# Patient Record
Sex: Male | Born: 1980 | Hispanic: Yes | Marital: Single | Smoking: Never smoker
Health system: Southern US, Community
[De-identification: ages and names within clinical notes are randomized; demographics above are authoritative.]

## PROBLEM LIST (undated history)

## (undated) DIAGNOSIS — C169 Malignant neoplasm of stomach, unspecified: Secondary | ICD-10-CM

## (undated) DIAGNOSIS — K219 Gastro-esophageal reflux disease without esophagitis: Secondary | ICD-10-CM

## (undated) HISTORY — PX: NO PAST SURGERIES: SHX2092

---

## 2004-01-20 ENCOUNTER — Emergency Department: Payer: Self-pay | Admitting: Emergency Medicine

## 2004-01-20 ENCOUNTER — Other Ambulatory Visit: Payer: Self-pay

## 2004-01-22 ENCOUNTER — Ambulatory Visit: Payer: Self-pay | Admitting: Emergency Medicine

## 2004-02-18 ENCOUNTER — Emergency Department: Payer: Self-pay | Admitting: Emergency Medicine

## 2012-06-28 ENCOUNTER — Emergency Department: Payer: Self-pay | Admitting: Emergency Medicine

## 2012-06-28 LAB — URINALYSIS, COMPLETE
Bacteria: NONE SEEN
Bilirubin,UR: NEGATIVE
Glucose,UR: NEGATIVE mg/dL (ref 0–75)
Ketone: NEGATIVE
Nitrite: NEGATIVE
Protein: 100
RBC,UR: 2 /HPF (ref 0–5)
Specific Gravity: 1.02 (ref 1.003–1.030)
Squamous Epithelial: NONE SEEN
WBC UR: <1 /HPF (ref 0–5)

## 2012-06-28 LAB — COMPREHENSIVE METABOLIC PANEL
Albumin: 3.8 g/dL (ref 3.4–5.0)
BUN: 12 mg/dL (ref 7–18)
Bilirubin,Total: 0.6 mg/dL (ref 0.2–1.0)
Co2: 27 mmol/L (ref 21–32)
EGFR (African American): >60
EGFR (Non-African Amer.): >60
Glucose: 100 mg/dL — ABNORMAL HIGH (ref 65–99)
Potassium: 3.4 mmol/L — ABNORMAL LOW (ref 3.5–5.1)
SGOT(AST): 45 U/L — ABNORMAL HIGH (ref 15–37)
Sodium: 141 mmol/L (ref 136–145)

## 2012-06-28 LAB — CBC
MCH: 29.7 pg (ref 26.0–34.0)
MCV: 85 fL (ref 80–100)
Platelet: 206 10*3/uL (ref 150–440)
RBC: 5.89 10*6/uL (ref 4.40–5.90)
WBC: 10.1 10*3/uL (ref 3.8–10.6)

## 2012-06-28 LAB — ETHANOL
Ethanol %: <0.003 % (ref 0.000–0.080)
Ethanol: <3 mg/dL

## 2012-06-28 LAB — LIPASE, BLOOD: Lipase: 88 U/L (ref 73–393)

## 2013-10-25 ENCOUNTER — Emergency Department: Payer: Self-pay | Admitting: Emergency Medicine

## 2015-07-05 ENCOUNTER — Emergency Department
Admission: EM | Admit: 2015-07-05 | Discharge: 2015-07-05 | Disposition: A | Discharge status: Home / Self Care | Payer: Self-pay | Attending: Emergency Medicine | Admitting: Emergency Medicine

## 2015-07-05 ENCOUNTER — Encounter: Payer: Self-pay | Admitting: Emergency Medicine

## 2015-07-05 ENCOUNTER — Emergency Department: Payer: Self-pay

## 2015-07-05 DIAGNOSIS — K219 Gastro-esophageal reflux disease without esophagitis: Secondary | ICD-10-CM | POA: Insufficient documentation

## 2015-07-05 DIAGNOSIS — R1013 Epigastric pain: Secondary | ICD-10-CM

## 2015-07-05 LAB — URINALYSIS COMPLETE WITH MICROSCOPIC (ARMC ONLY)
BACTERIA UA: NONE SEEN
BILIRUBIN URINE: NEGATIVE
GLUCOSE, UA: NEGATIVE mg/dL
HGB URINE DIPSTICK: NEGATIVE
Ketones, ur: NEGATIVE mg/dL
LEUKOCYTES UA: NEGATIVE
NITRITE: NEGATIVE
Protein, ur: 100 mg/dL — AB
SQUAMOUS EPITHELIAL / LPF: NONE SEEN
Specific Gravity, Urine: 1.029 (ref 1.005–1.030)
pH: 5 (ref 5.0–8.0)

## 2015-07-05 LAB — CBC
HEMATOCRIT: 46.5 % (ref 40.0–52.0)
Hemoglobin: 15.6 g/dL (ref 13.0–18.0)
MCH: 28.4 pg (ref 26.0–34.0)
MCHC: 33.6 g/dL (ref 32.0–36.0)
MCV: 84.7 fL (ref 80.0–100.0)
PLATELETS: 218 10*3/uL (ref 150–440)
RBC: 5.5 MIL/uL (ref 4.40–5.90)
RDW: 12.9 % (ref 11.5–14.5)
WBC: 8.2 10*3/uL (ref 3.8–10.6)

## 2015-07-05 LAB — COMPREHENSIVE METABOLIC PANEL
ALBUMIN: 4.3 g/dL (ref 3.5–5.0)
ALT: 33 U/L (ref 17–63)
AST: 30 U/L (ref 15–41)
Alkaline Phosphatase: 68 U/L (ref 38–126)
Anion gap: 8 (ref 5–15)
BILIRUBIN TOTAL: 0.6 mg/dL (ref 0.3–1.2)
BUN: 14 mg/dL (ref 6–20)
CO2: 27 mmol/L (ref 22–32)
CREATININE: 0.91 mg/dL (ref 0.61–1.24)
Calcium: 8.9 mg/dL (ref 8.9–10.3)
Chloride: 105 mmol/L (ref 101–111)
GFR calc Af Amer: >60 mL/min (ref >60)
GLUCOSE: 182 mg/dL — AB (ref 65–99)
Potassium: 3.4 mmol/L — ABNORMAL LOW (ref 3.5–5.1)
Sodium: 140 mmol/L (ref 135–145)
TOTAL PROTEIN: 7.3 g/dL (ref 6.5–8.1)

## 2015-07-05 LAB — TROPONIN I

## 2015-07-05 LAB — LIPASE, BLOOD: Lipase: 22 U/L (ref 11–51)

## 2015-07-05 MED ORDER — OXYCODONE-ACETAMINOPHEN 5-325 MG PO TABS: 2.0000 | ORAL_TABLET | Freq: Four times a day (QID) | ORAL | Status: DC | PRN

## 2015-07-05 MED ORDER — GI COCKTAIL ~~LOC~~
30.0000 mL | Freq: Once | ORAL | Status: AC
  Administered 2015-07-05: 30 mL via ORAL
  Filled 2015-07-05: qty 30

## 2015-07-05 MED ORDER — FAMOTIDINE 20 MG PO TABS: 20.0000 mg | ORAL_TABLET | Freq: Two times a day (BID) | ORAL | Status: DC

## 2015-07-05 MED ORDER — FAMOTIDINE 20 MG PO TABS
20.0000 mg | ORAL_TABLET | Freq: Once | ORAL | Status: AC
Start: 2015-07-05 — End: 2015-07-05
  Administered 2015-07-05: 20 mg via ORAL
  Filled 2015-07-05: qty 1

## 2015-07-05 MED ORDER — ONDANSETRON HCL 4 MG/2ML IJ SOLN
4.0000 mg | Freq: Once | INTRAMUSCULAR | Status: AC
  Administered 2015-07-05: 4 mg via INTRAVENOUS
  Filled 2015-07-05: qty 2

## 2015-07-05 MED ORDER — SUCRALFATE 1 G PO TABS: 1.0000 g | ORAL_TABLET | Freq: Four times a day (QID) | ORAL | Status: DC

## 2015-07-05 MED ORDER — MORPHINE SULFATE (PF) 4 MG/ML IV SOLN
4.0000 mg | Freq: Once | INTRAVENOUS | Status: AC
  Administered 2015-07-05: 4 mg via INTRAVENOUS
  Filled 2015-07-05: qty 1

## 2015-07-05 NOTE — ED Notes (Signed)
Pt to ED with upper mid abdominal pain that radiates through to the back for 2 weeks now.  Pt endorses "some nausea and vomiting".  Some dizziness, diaphoresis and headache involved, off and on.

## 2015-07-05 NOTE — Discharge Instructions (Signed)
Enfermedad por reflujo gastroesofgico en los adultos (Gastroesophageal Reflux Disease, Adult) Normalmente, los alimentos descienden por el esfago y se depositan en el estmago para su digestin. Sin embargo, cuando una persona tiene enfermedad por reflujo gastroesofgico (ERGE), los alimentos y el cido estomacal regresan al esfago. Cuando esto ocurre, el esfago se irrita y se inflama. Con el tiempo, la ERGE puede provocar la formacin de pequeas perforaciones (lceras) en la mucosa del esfago.  CAUSAS Un problema del msculo que se encuentra entre el esfago y Product manager (esfnter esofgico inferior o EEI) es la causa de esta enfermedad. Por lo general, el esfnter esofgico inferior se cierra despus de que los alimentos pasan a travs del esfago hacia el [place]. Cuando el EEI est debilitado o no es normal, no se cierra correctamente, lo que permite el paso retrgrado de los alimentos y el cido estomacal al esfago. Algunas sustancias de la dieta, algunos medicamentos y Arboriculturist enfermedades pueden debilitar este esfnter, entre ellos:  Consumo de tabaco.  Antonito.  Hernia de hiato.  Consumo excesivo de alcohol.  Algunos alimentos y ciertas bebidas, como el caf, el chocolate, las cebollas y Production manager. FACTORES DE RIESGO Es ms probable que esta afeccin se manifieste en:  Los personas con sobrepeso.  Las personas con trastornos del tejido conjuntivo.  Las personas que toman antiinflamatorios no esteroides (AINE). SNTOMAS Los sntomas de esta afeccin incluyen lo siguiente:  Merchant navy officer.  Dificultad o dolor al tragar.  Sensacin de Best boy un bulto en la garganta.  Sabor amargo en la boca.  Mal aliento.  Gran cantidad de saliva.  Malestar estomacal o meteorismo.  Flatulencias.  Dolor en el pecho.  Falta de aire o sibilancias.  Tos permanente (crnica) o tos nocturna.  Desgaste el esmalte dental.  Prdida de peso. El dolor en el pecho puede deberse a  muchas afecciones diferentes. Consulte al mdico si tiene Tourist information centre manager. DIAGNSTICO El mdico le har una historia clnica y un examen fsico. Para determinar si la ERGE es leve o grave, el mdico tambin puede controlar la respuesta al [place]. Tambin pueden hacerle otros estudios, por ejemplo:  Una endoscopia para examinar el estmago y el esfago con Ardelia Mems pequea cmara.  Un estudio que determina el nivel de acidez en el esfago.  Un estudio que mide la presin que hay en el esfago.  Un estudio de deglucin de bario o un estudio modificado de deglucin de bario para mostrar la forma, el tamao y el funcionamiento del esfago. [place] es aliviar los sntomas y Product/process development scientist las complicaciones. El tratamiento de esta afeccin puede variar en funcin de la gravedad de los sntomas. El mdico podr indicar lo siguiente:  Cambios en la dieta.  Medicamentos.  Ciruga. INSTRUCCIONES PARA EL CUIDADO EN EL HOGAR Dieta  Siga la dieta que le haya recomendado el mdico, la cual puede incluir evitar alimentos y bebidas tales como:  Caf y t (con o sin cafena).  Bebidas que contengan alcohol.  Bebidas energizantes y deportivas.  Gaseosas o refrescos.  Chocolate y cacao.  Menta y [place].  Ajo y cebollas.  Rbano picante.  Alimentos muy condimentados y cidos, entre ellos, pimientos, [place] en polvo, curry en polvo, vinagre, salsas picantes y salsa barbacoa.  Frutas ctricas y sus jugos, como naranjas, limones y limas.  Alimentos a base de tomates, como salsa roja, [place], salsa y pizza con salsa roja.  Alimentos fritos y grasos, como rosquillas, papas fritas y aderezos con Counselling psychologist  contenido de [place].  Carnes con alto contenido de [place], como hot dogs y cortes grasos de carnes rojas y blancas, por ejemplo, filetes de entrecot, salchicha, jamn y tocino.  Productos lcteos con alto contenido de [place], como [place], [place] y  queso crema.  Haga comidas pequeas y frecuentes Medical sales representative de comidas abundantes.  Evite beber [place] comidas.  No coma durante las 2 o 3horas previas a la hora de [place].  No se acueste inmediatamente despus de comer.  No haga actividad fsica enseguida despus de comer. Instrucciones generales  Est atento a cualquier cambio en los sntomas.  Tome los medicamentos de venta libre y los recetados solamente como se lo haya indicado el mdico. No tome aspirina, ibuprofeno ni otros antiinflamatorios no esteroides (AINE), a menos que se lo haya indicado el mdico.  No consuma ningn producto que contenga tabaco, lo que incluye cigarrillos, tabaco de Higher education careers adviser y Psychologist, sport and exercise. Si necesita ayuda para dejar de fumar, consulte al mdico.  Use ropas sueltas. No use prendas ajustadas alrededor de la cintura que ejerzan presin en el abdomen.  Levante (eleve) 6pulgadas (15centmetros) la cabecera de la cama.  Trate de reducir [employer] de estrs con actividades como el yoga o la meditacin. Si necesita ayuda para reducir [employer] de estrs, consulte al mdico.  Si tiene sobrepeso, Multimedia programmer un peso saludable. Hable con el mdico acerca de su peso ideal y pdale asesoramiento en cuanto a la dieta que debe seguir para Therapist, music.  Concurra a todas las visitas de control como se lo haya indicado el mdico. Esto es importante. SOLICITE ATENCIN MDICA SI:  Aparecen nuevos sntomas.  Baja de peso sin causa aparente.  Tiene dificultad para tragar o siente dolor al [employer].  Tiene sibilancias o tos persistente.  Los sntomas no mejoran con Dispensing optician.  Tiene la voz ronca. [place] [employer] SI:  Tiene dolor en los brazos, el cuello, los [place], la dentadura o la espalda.  Philbert Riser, se marea o tiene sensacin de desvanecimiento.  Siente falta de aire o Tourist information centre manager.  Vomita y el vmito es  parecido a la sangre o a los granos de caf.  Se desmaya.  Las heces son sanguinolentas o de color negro.  No puede tragar, beber o comer.   Esta informacin no tiene Marine scientist el consejo del mdico. Asegrese de hacerle al mdico cualquier pregunta que tenga.   Document Released: 10/30/2004 Document Revised: 10/11/2014 Elsevier Interactive Patient Education [employer].

## 2015-07-05 NOTE — ED Provider Notes (Signed)
[facility] Emergency Department Provider Note        Time seen: ----------------------------------------- 12:02 PM on 07/05/2015 -----------------------------------------    I have reviewed the triage vital signs and the nursing notes.   HISTORY  Chief Complaint Abdominal Pain    HPI Anthony Melendez is a 35 y.o. male who presents ER for upper abdominal pain that radiates through his back for 2 weeks now. He reports some nausea and vomiting, dizziness, diaphoresis, headache. Currently the pain is in the upper abdomen. Nothing makes it better, eating any food makes it worse. Patient does report eating a lot of spicy foods.  History reviewed. No pertinent past medical history.  There are no active problems to display for this patient.   History reviewed. No pertinent past surgical history.  Allergies Review of patient's allergies indicates no known allergies.  Social History Social History  Substance Use Topics  . Smoking status: Never Smoker   . Smokeless tobacco: None  . Alcohol Use: No    Review of Systems Constitutional: Negative for fever. Eyes: Negative for visual changes. ENT: Negative for sore throat. Cardiovascular: Negative for chest pain. Respiratory: Negative for shortness of breath. Gastrointestinal: Positive for abdominal pain and vomiting Genitourinary: Negative for dysuria. Musculoskeletal: Negative for back pain. Skin: Negative for rash. Neurological: Negative for headaches, positive for weakness  10-point ROS otherwise negative.  ____________________________________________   PHYSICAL EXAM:  VITAL SIGNS: ED Triage Vitals  Enc Vitals Group     BP 07/05/15 1140 136/83 mmHg     Pulse Rate 07/05/15 1140 92     Resp 07/05/15 1140 18     Temp 07/05/15 1140 98.1 F (36.7 C)     Temp Source 07/05/15 1140 Oral     SpO2 07/05/15 1140 97 %     Weight 07/05/15 1140 180 lb (81.647 kg)     Height 07/05/15 1140 5\' 5"   (1.651 m)     Head Cir --      Peak Flow --      Pain Score 07/05/15 1141 8     Pain Loc --      Pain Edu? --      Excl. in [place]? --     Constitutional: Alert and oriented. Well appearing and in no distress. Eyes: Conjunctivae are normal. PERRL. Normal extraocular movements. ENT   Head: Normocephalic and atraumatic.   Nose: No congestion/rhinnorhea.   Mouth/Throat: Mucous membranes are moist.   Neck: No stridor. Cardiovascular: Normal rate, regular rhythm. No murmurs, rubs, or gallops. Respiratory: Normal respiratory effort without tachypnea nor retractions. Breath sounds are clear and equal bilaterally. No wheezes/rales/rhonchi. Gastrointestinal: Mild epigastric tenderness, no rebound or guarding. Normal bowel sounds. Musculoskeletal: Nontender with normal range of motion in all extremities. No lower extremity tenderness nor edema. Neurologic:  Normal speech and language. No gross focal neurologic deficits are appreciated.  Skin:  Skin is warm, dry and intact. No rash noted. Psychiatric: Mood and affect are normal. Speech and behavior are normal.  ____________________________________________  EKG: Interpreted by me. Sinus rhythm with a rate of 70 bpm, normal PR interval, normal QRS, normal QT interval. Normal axis.  ____________________________________________  ED COURSE:  Pertinent labs & imaging results that were available during my care of the patient were reviewed by me and considered in my medical decision making (see chart for details). Patient presents to ER with epigastric pain. I will check basic labs and reevaluate. ____________________________________________    LABS (pertinent positives/negatives)  Labs Reviewed  COMPREHENSIVE  METABOLIC PANEL - Abnormal; Notable for the following:    Potassium 3.4 (*)    Glucose, Bld 182 (*)    All other components within normal limits  URINALYSIS COMPLETEWITH MICROSCOPIC ([facility] ONLY) - Abnormal; Notable for the  following:    Color, Urine YELLOW (*)    APPearance CLEAR (*)    Protein, ur 100 (*)    All other components within normal limits  LIPASE, BLOOD  CBC  TROPONIN I    RADIOLOGY Images were viewed by me Chest x-ray, abdominal x-rays IMPRESSION: No acute disease.  IMPRESSION: No acute findings. ____________________________________________  FINAL ASSESSMENT AND PLAN  Epigastric pain, GERD  Plan: Patient with labs and imaging as dictated above. Patient presents with a grossly unremarkable workup. He was given a GI cocktail as well as started on Pepcid. He'll be discharged with Pepcid, Carafate and short supply of Percocet for pain. He is stable for discharge.   Earleen Newport, MD   Note: This dictation was prepared with Dragon dictation. Any transcriptional errors that result from this process are unintentional   Earleen Newport, MD 07/05/15 1353

## 2015-07-28 ENCOUNTER — Other Ambulatory Visit: Payer: Self-pay

## 2015-07-28 ENCOUNTER — Emergency Department: Payer: Self-pay

## 2015-07-28 ENCOUNTER — Encounter: Payer: Self-pay | Admitting: Emergency Medicine

## 2015-07-28 DIAGNOSIS — R079 Chest pain, unspecified: Secondary | ICD-10-CM | POA: Insufficient documentation

## 2015-07-28 DIAGNOSIS — C169 Malignant neoplasm of stomach, unspecified: Secondary | ICD-10-CM | POA: Insufficient documentation

## 2015-07-28 DIAGNOSIS — K219 Gastro-esophageal reflux disease without esophagitis: Secondary | ICD-10-CM | POA: Insufficient documentation

## 2015-07-28 DIAGNOSIS — Z79899 Other long term (current) drug therapy: Secondary | ICD-10-CM | POA: Insufficient documentation

## 2015-07-28 LAB — CBC
HEMATOCRIT: 41.6 % (ref 40.0–52.0)
HEMOGLOBIN: 14.3 g/dL (ref 13.0–18.0)
MCH: 28.3 pg (ref 26.0–34.0)
MCHC: 34.4 g/dL (ref 32.0–36.0)
MCV: 82.3 fL (ref 80.0–100.0)
Platelets: 269 10*3/uL (ref 150–440)
RBC: 5.05 MIL/uL (ref 4.40–5.90)
RDW: 12.4 % (ref 11.5–14.5)
WBC: 7.5 10*3/uL (ref 3.8–10.6)

## 2015-07-28 LAB — COMPREHENSIVE METABOLIC PANEL
ALT: 22 U/L (ref 17–63)
ANION GAP: 7 (ref 5–15)
AST: 24 U/L (ref 15–41)
Albumin: 4.1 g/dL (ref 3.5–5.0)
Alkaline Phosphatase: 73 U/L (ref 38–126)
BILIRUBIN TOTAL: 0.4 mg/dL (ref 0.3–1.2)
BUN: 13 mg/dL (ref 6–20)
CHLORIDE: 104 mmol/L (ref 101–111)
CO2: 28 mmol/L (ref 22–32)
Calcium: 8.9 mg/dL (ref 8.9–10.3)
Creatinine, Ser: 0.85 mg/dL (ref 0.61–1.24)
Glucose, Bld: 126 mg/dL — ABNORMAL HIGH (ref 65–99)
POTASSIUM: 3.5 mmol/L (ref 3.5–5.1)
Sodium: 139 mmol/L (ref 135–145)
TOTAL PROTEIN: 7.3 g/dL (ref 6.5–8.1)

## 2015-07-28 LAB — TROPONIN I: Troponin I: <0.03 ng/mL (ref <0.031)

## 2015-07-28 LAB — LIPASE, BLOOD: LIPASE: 28 U/L (ref 11–51)

## 2015-07-28 MED ORDER — ONDANSETRON 4 MG PO TBDP
4.0000 mg | ORAL_TABLET | Freq: Once | ORAL | Status: AC | PRN
  Administered 2015-07-28: 4 mg via ORAL
  Filled 2015-07-28: qty 1

## 2015-07-28 NOTE — ED Notes (Addendum)
Pt c/o pain to upper abd intermittent pain for about 6 weeks; seen here 3 weeks ago for same; has been to [facility] ER twice since that visit here for same; pt diagnosed with gastric reflux; pt says after eating anything pain increases; c/o nausea and vomiting, last vomited on the way to the ED; pt says pain sometimes moves from left to right; also has intermittent chest pain from left to right; currently c/o upper left sided chest pain and mild headache; pt says he was taking the prescribed medications up to today, but didn't take because he doesn't see it helping

## 2015-07-28 NOTE — ED Notes (Signed)
Interpreter request submitted. 

## 2015-07-29 ENCOUNTER — Emergency Department
Admission: EM | Admit: 2015-07-29 | Discharge: 2015-07-29 | Disposition: A | Discharge status: Home / Self Care | Payer: Self-pay | Attending: Emergency Medicine | Admitting: Emergency Medicine

## 2015-07-29 ENCOUNTER — Emergency Department: Payer: Self-pay

## 2015-07-29 DIAGNOSIS — R109 Unspecified abdominal pain: Secondary | ICD-10-CM

## 2015-07-29 DIAGNOSIS — C169 Malignant neoplasm of stomach, unspecified: Secondary | ICD-10-CM

## 2015-07-29 HISTORY — DX: Gastro-esophageal reflux disease without esophagitis: K21.9

## 2015-07-29 LAB — URINALYSIS COMPLETE WITH MICROSCOPIC (ARMC ONLY)
BILIRUBIN URINE: NEGATIVE
Glucose, UA: NEGATIVE mg/dL
HGB URINE DIPSTICK: NEGATIVE
Leukocytes, UA: NEGATIVE
NITRITE: NEGATIVE
PH: 5 (ref 5.0–8.0)
PROTEIN: 100 mg/dL — AB
SQUAMOUS EPITHELIAL / LPF: NONE SEEN
Specific Gravity, Urine: 1.033 — ABNORMAL HIGH (ref 1.005–1.030)

## 2015-07-29 MED ORDER — MORPHINE SULFATE (PF) 4 MG/ML IV SOLN
4.0000 mg | Freq: Once | INTRAVENOUS | Status: DC
  Filled 2015-07-29: qty 1

## 2015-07-29 MED ORDER — OXYCODONE-ACETAMINOPHEN 5-325 MG PO TABS: 1.0000 | ORAL_TABLET | ORAL | Status: DC | PRN

## 2015-07-29 MED ORDER — DOCUSATE SODIUM 100 MG PO CAPS: ORAL_CAPSULE | ORAL | Status: DC

## 2015-07-29 MED ORDER — IOPAMIDOL (ISOVUE-300) INJECTION 61%
100.0000 mL | Freq: Once | INTRAVENOUS | Status: AC | PRN
  Administered 2015-07-29: 100 mL via INTRAVENOUS

## 2015-07-29 MED ORDER — DIATRIZOATE MEGLUMINE & SODIUM 66-10 % PO SOLN
15.0000 mL | Freq: Once | ORAL | Status: AC
  Administered 2015-07-29: 15 mL via ORAL

## 2015-07-29 NOTE — ED Notes (Signed)
Assessed pt with assistance of translator [place]. Pt c/o upper abdominal pain, chest pain and upper medial back pain described as pressure. Pt c/o N/V, SOB. Pt denies diarrhea

## 2015-07-29 NOTE — ED Notes (Signed)
Interpreter at bedside. Pt denies pain at this time. Requesting pain medication for discharge.

## 2015-07-29 NOTE — Discharge Instructions (Signed)
Cncer gstrico (Gastric Cancer) Al cncer gstrico tambin se lo conoce como cncer de estmago. Es un crecimiento anormal de clulas en el estmago que es canceroso [place]).  CAUSAS La causa exacta del cncer gstrico se desconoce. FACTORES DE RIESGO  Ser mayor de [phone].  Ser varn.  Ser estadounidenses de [place] oriental, de las islas del Pacf[place], hispano o afroamericano.  Consumir una dieta con alto contenido de alimentos ahumados, [employer] o encurtidos.  Consumir cualquier producto que contenga tabaco, como cigarrillos, tabaco de Higher education careers adviser o Psychologist, sport and exercise.  Consumir bebidas alcohlicas en exceso.  Tener antecedentes de lo siguiente:  Ciruga de Paramedic.  Gastritis crnica.  Plipos estomacales.  Anemia perniciosa.  Infeccin estomacal por bacterias H. pylori.  Antecedentes familiares de cncer gstrico.  Sangre tipoA. [place] los sntomas se pueden incluir los siguientes:  Prdida del apetito.  Sensacin de saciedad despus de ingerir una comida pequea.  Problemas para tragar.  Nuseas.  Dolor abdominal.  Gases en exceso o flatulencias.  Bajar de peso sin proponrselo.  Vmitos. Pueden incluir vmitos con sangre.  Fatiga. DIAGNSTICO El mdico le har preguntas sobre su historia clnica y un examen fsico. Podrn solicitarle otros estudios, por ejemplo:  Deglucin de bario.  Examen endoscpico.  Tomografa computarizada.  Una prueba de [place] de tejido (biopsia). Si se confirma el diagnstico de cncer gstrico, se lo estadificar para determinar su gravedad y extensin. La estadificacin es la evaluacin de lo siguiente:  El tamao del tumor.  Si el cncer se ha diseminado.  Adnde se ha diseminado. TRATAMIENTO El tratamiento del cncer gstrico depende del tipo y el estadio de la enfermedad, y puede incluir uno o ms de los siguientes:  Ciruga para extirpar todo lo que sea posible del tumor  (gastrectoma).  Quimioterapia. Este tratamiento utiliza medicamentos para destruir las clulas cancerosas.  Radioterapia para destruir las clulas cancerosas.  Tratamiento farmacolgico dirigido. Estos medicamentos inhiben el crecimiento y la diseminacin del cncer. Este tratamiento es diferente de la quimioterapia estndar.  Inmunoterapia. Tambin conocida como terapia biolgica. Se trata de un tratamiento novedoso que fortalece el sistema de defensa del organismo (inmunitario) para combatir las clulas cancerosas.  Se pueden administrar antibiticos para tratar la infeccin porH. pylori. INSTRUCCIONES PARA EL CUIDADO EN EL HOGAR  Hable con el mdico sobre la dieta y el plan de alimentacin ms adecuados para usted, que pueden incluir el consumo de ms frutas y verduras. Adems, evitar lo siguiente:  Carne roja.  Carnes procesadas.  Alimentos salados.  Alimentos DIRECTV.  Alimentos encurtidos.  Tome los medicamentos solamente como se lo haya indicado el mdico.  Si le recetaron antibiticos, asegrese de terminarlos, incluso si comienza a sentirse mejor.  Hable con el mdico sobre cmo limitar o evitar el consumo de alcohol.  No consuma ningn producto que contenga tabaco, lo que incluye cigarrillos, tabaco de Higher education careers adviser o Psychologist, sport and exercise. Si necesita ayuda para dejar de fumar, consulte al mdico.  Concurra a todas las visitas de control como se lo haya indicado el mdico.  Considere la posibilidad de Chief Financial Officer en un grupo de apoyo.  Busque asesoramiento que lo ayude a [employer] secundarios del [place]. SOLICITE ATENCIN MDICA SI:  Tiene dificultad para comer.  Tiene problemas con los medicamentos.  Sigue bajando de peso sin proponrselo.  Tiene fiebre. SOLICITE ATENCIN MDICA DE INMEDIATO SI:  Tiene nuseas, vmitos o diarrea que no puede controlar.  Siente un dolor que no IT consultant.  Vomita sangre de color rojo brillante o  material que parezca granos de caf.  Tiene dificultad para respirar.  Se desmaya.   Esta informacin no tiene Marine scientist el consejo del mdico. Asegrese de hacerle al mdico cualquier pregunta que tenga.   Document Released: 05/08/2008 Document Revised: 02/10/2014 Elsevier Interactive Patient Education [employer].

## 2015-07-29 NOTE — ED Provider Notes (Signed)
[facility] Emergency Department Provider Note  ____________________________________________  Time seen: Approximately 1:31 AM  I have reviewed the triage vital signs and the nursing notes.   HISTORY  Chief Complaint Abdominal Pain; Nausea; and Chest Pain  The patient and/or family speak(s) Spanish.  They understand they have the right to the use of a hospital interpreter, however at this time they prefer to speak directly with me in [place].  They know that they can ask for an interpreter at any time.   HPI Anthony Melendez is a 35 y.o. male with no significant past medical history but who his been to this ED once previously and the [facility] emergency Department twice previously, all over about the last 6 weeks, for evaluation of abdominal pain in the epigastric region that radiates through to his chest and back.  He describes it as severe, sharp, stabbing, and burning.  He usually occurs more severely after he eats.  It is been consistent and persistent over the last 6 weeks.  He denies nausea, vomiting, shortness of breath, diarrhea, constipation, fever/chills.  He claims that he has been taking the medication as prescribed previously (antacids and sucralfate) but that it is not been helping.  Food seems to make it worse and nothing seems to make it better.   Past Medical History  Diagnosis Date  . GERD (gastroesophageal reflux disease)     There are no active problems to display for this patient.   History reviewed. No pertinent past surgical history.  Current Outpatient Rx  Name  Route  Sig  Dispense  Refill  . famotidine (PEPCID) 20 MG tablet   Oral   Take 1 tablet (20 mg total) by mouth 2 (two) times daily.   60 tablet   1   . sucralfate (CARAFATE) 1 g tablet   Oral   Take 1 tablet (1 g total) by mouth 4 (four) times daily.   120 tablet   1   . docusate sodium (COLACE) 100 MG capsule      Take 1 tablet once or twice daily as needed for  constipation while taking narcotic pain medicine   30 capsule   0   . oxyCODONE-acetaminophen (ROXICET) 5-325 MG tablet   Oral   Take 1-2 tablets by mouth every 4 (four) hours as needed for severe pain.   30 tablet   0     Allergies Review of patient's allergies indicates no known allergies.  History reviewed. No pertinent family history.  Social History Social History  Substance Use Topics  . Smoking status: Never Smoker   . Smokeless tobacco: None  . Alcohol Use: No    Review of Systems Constitutional: No fever/chills Eyes: No visual changes. ENT: No sore throat. Cardiovascular: chest pain from abdomen Respiratory: Denies shortness of breath. Gastrointestinal: +abdominal pain radiating into chest and back.  No nausea, no vomiting.  No diarrhea.  No constipation. Genitourinary: Negative for dysuria. Musculoskeletal: Negative for back pain. Skin: Negative for rash. Neurological: Negative for headaches, focal weakness or numbness.  10-point ROS otherwise negative.  ____________________________________________   PHYSICAL EXAM:  VITAL SIGNS: ED Triage Vitals  Enc Vitals Group     BP 07/29/15 0047 108/76 mmHg     Pulse Rate 07/29/15 0047 63     Resp 07/29/15 0047 26     Temp --      Temp Source 07/28/15 2155 Oral     SpO2 07/29/15 0047 96 %     Weight 07/28/15  2155 195 lb (88.451 kg)     Height 07/28/15 2155 5\' 5"  (1.651 m)     Head Cir --      Peak Flow --      Pain Score 07/28/15 2156 5     Pain Loc --      Pain Edu? --      Excl. in [place]? --     Constitutional: Alert and oriented. Well appearing and in no acute distress. Eyes: Conjunctivae are normal. PERRL. EOMI. Head: Atraumatic. Nose: No congestion/rhinnorhea. Mouth/Throat: Mucous membranes are moist.  Oropharynx non-erythematous. Neck: No stridor.  No meningeal signs.   Cardiovascular: Normal rate, regular rhythm. Good peripheral circulation. Grossly normal heart sounds.   Respiratory: Normal  respiratory effort.  No retractions. Lungs CTAB. Gastrointestinal: Soft With mild tenderness to palpation of the epigastrium Musculoskeletal: No lower extremity tenderness nor edema. No gross deformities of extremities. Neurologic:  Normal speech and language. No gross focal neurologic deficits are appreciated.  Skin:  Skin is warm, dry and intact. No rash noted. Psychiatric: Mood and affect are normal. Speech and behavior are normal.  ____________________________________________   LABS (all labs ordered are listed, but only abnormal results are displayed)  Labs Reviewed  COMPREHENSIVE METABOLIC PANEL - Abnormal; Notable for the following:    Glucose, Bld 126 (*)    All other components within normal limits  URINALYSIS COMPLETEWITH MICROSCOPIC ([facility] ONLY) - Abnormal; Notable for the following:    Color, Urine AMBER (*)    APPearance CLEAR (*)    Ketones, ur TRACE (*)    Specific Gravity, Urine 1.033 (*)    Protein, ur 100 (*)    Bacteria, UA RARE (*)    All other components within normal limits  LIPASE, BLOOD  CBC  TROPONIN I   ____________________________________________  EKG  ED ECG REPORT I, Daffney Greenly, the attending physician, personally viewed and interpreted this ECG.  Date: 07/29/2015 EKG Time: 22:09 Rate: 76 Rhythm: normal sinus rhythm QRS Axis: normal Intervals: normal ST/T Wave abnormalities: normal Conduction Disturbances: none Narrative Interpretation: unremarkable  ____________________________________________  RADIOLOGY   Dg Chest 2 View  07/28/2015  CLINICAL DATA:  Left-sided chest pain. EXAM: CHEST  2 VIEW COMPARISON:  July 05, 2015 FINDINGS: The heart, hila, mediastinum, and pleura are normal. No pneumothorax. Possible 6 mm nodule in the left upper lung, overlapping two ribs. No other nodules. No masses or infiltrates. No other acute abnormalities. IMPRESSION: Suggested 6 mm nodule in the left upper lung, not seen previously. It is possible this  could represent confluence of shadows as it was not seen on the x-ray from 24 days ago. A CT scan could better evaluate this region. Otherwise, recommend short-term follow-up x-ray. Electronically Signed   By: Dorise Bullion III M.D   On: 07/28/2015 22:42   Ct Chest W Contrast  07/29/2015  CLINICAL DATA:  35 year old male with upper abdominal/lower chest pain EXAM: CT CHEST, ABDOMEN, AND PELVIS WITH CONTRAST TECHNIQUE: Multidetector CT imaging of the chest, abdomen and pelvis was performed following the standard protocol during bolus administration of intravenous contrast. CONTRAST:  183mL ISOVUE-300 IOPAMIDOL (ISOVUE-300) INJECTION 61% COMPARISON:  Chest radiograph dated 07/28/2015 FINDINGS: CT CHEST The lungs are clear. No pulmonary nodule identified. There is no pleural effusion or pneumothorax. The central airways are patent. The thoracic aorta appears unremarkable. The origins of the great vessels of the aortic arch appear patent. The central pulmonary arteries appear unremarkable. There is no cardiomegaly or pericardial effusion. No hilar or mediastinal  adenopathy. The esophagus is grossly unremarkable. No thyroid nodule identified. There is no axillary adenopathy. The chest wall soft tissues appear unremarkable. The osseous structures are intact. CT ABDOMEN AND PELVIS There is no intra-abdominal free air.  Small ascites. The liver, gallbladder, pancreas, spleen, adrenal glands, kidneys, visualized ureters, and urinary bladder appear unremarkable. The prostate and seminal vesicles are grossly unremarkable. There is diffuse thickened and irregular appearance of the gastric body and pylorus highly concerning for underlying infiltrative neoplasm such as adenocarcinoma ( possibly serous type) or less likely lymphoma. The wall of the stomach measures approximately 4 cm in thickness at the lesser curvature There is extensive nodular and infiltrative involvement of the upper mesentery and gastrohepatic ligament.  There is diffuse omental nodularity. There is thickened and irregular appearance of the diaphragms, right greater left compatible with metastatic implants. Oral contrast is noted within the stomach and multiple loops of small bowel traversing into the colon without evidence of bowel obstruction. Normal appendix. The abdominal aorta and IVC appear unremarkable. The origins of the celiac axis, SMA, IMA as well as the origins of the renal arteries are patent. No portal venous gas identified. No retroperitoneal adenopathy. Small fat containing umbilical hernia. The abdominal wall soft tissues are otherwise unremarkable. The osseous structures are intact. IMPRESSION: Thickened and irregular in gastric wall most compatible with an infiltrative malignancy. Endoscopy is recommended for further evaluation. There is extensive involvement of the upper mesentery and omentum compatible with peritoneal carcinomatosis. Small ascites, likely malignant. No bowel obstruction.  Normal appendix. No acute intrathoracic pathology. These results were called by telephone at the time of interpretation on 07/29/2015 at 6:55 am to Dr. Hinda Kehr , who verbally acknowledged these results. Electronically Signed   By: Anner Crete M.D.   On: 07/29/2015 07:00   Ct Abdomen Pelvis W Contrast  07/29/2015  CLINICAL DATA:  35 year old male with upper abdominal/lower chest pain EXAM: CT CHEST, ABDOMEN, AND PELVIS WITH CONTRAST TECHNIQUE: Multidetector CT imaging of the chest, abdomen and pelvis was performed following the standard protocol during bolus administration of intravenous contrast. CONTRAST:  118mL ISOVUE-300 IOPAMIDOL (ISOVUE-300) INJECTION 61% COMPARISON:  Chest radiograph dated 07/28/2015 FINDINGS: CT CHEST The lungs are clear. No pulmonary nodule identified. There is no pleural effusion or pneumothorax. The central airways are patent. The thoracic aorta appears unremarkable. The origins of the great vessels of the aortic arch  appear patent. The central pulmonary arteries appear unremarkable. There is no cardiomegaly or pericardial effusion. No hilar or mediastinal adenopathy. The esophagus is grossly unremarkable. No thyroid nodule identified. There is no axillary adenopathy. The chest wall soft tissues appear unremarkable. The osseous structures are intact. CT ABDOMEN AND PELVIS There is no intra-abdominal free air.  Small ascites. The liver, gallbladder, pancreas, spleen, adrenal glands, kidneys, visualized ureters, and urinary bladder appear unremarkable. The prostate and seminal vesicles are grossly unremarkable. There is diffuse thickened and irregular appearance of the gastric body and pylorus highly concerning for underlying infiltrative neoplasm such as adenocarcinoma ( possibly serous type) or less likely lymphoma. The wall of the stomach measures approximately 4 cm in thickness at the lesser curvature There is extensive nodular and infiltrative involvement of the upper mesentery and gastrohepatic ligament. There is diffuse omental nodularity. There is thickened and irregular appearance of the diaphragms, right greater left compatible with metastatic implants. Oral contrast is noted within the stomach and multiple loops of small bowel traversing into the colon without evidence of bowel obstruction. Normal appendix. The abdominal aorta and  IVC appear unremarkable. The origins of the celiac axis, SMA, IMA as well as the origins of the renal arteries are patent. No portal venous gas identified. No retroperitoneal adenopathy. Small fat containing umbilical hernia. The abdominal wall soft tissues are otherwise unremarkable. The osseous structures are intact. IMPRESSION: Thickened and irregular in gastric wall most compatible with an infiltrative malignancy. Endoscopy is recommended for further evaluation. There is extensive involvement of the upper mesentery and omentum compatible with peritoneal carcinomatosis. Small ascites, likely  malignant. No bowel obstruction.  Normal appendix. No acute intrathoracic pathology. These results were called by telephone at the time of interpretation on 07/29/2015 at 6:55 am to Dr. Hinda Kehr , who verbally acknowledged these results. Electronically Signed   By: Anner Crete M.D.   On: 07/29/2015 07:00   US Abdomen Limited Ruq  07/29/2015  CLINICAL DATA:  Intermittent RIGHT upper quadrant pain for 6 weeks. EXAM: US ABDOMEN LIMITED - RIGHT UPPER QUADRANT COMPARISON:  None. FINDINGS: Gallbladder: No gallstones or wall thickening visualized. No sonographic Murphy sign noted by sonographer. Common bile duct: Diameter: 3 mm Liver: Mildly echogenic liver. Small amount of perihepatic free fluid. No intrahepatic free fluid. 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 Small amount of ascites. Electronically Signed   By: Elon Alas M.D.   On: 07/29/2015 04:50    ____________________________________________   PROCEDURES  Procedure(s) performed:   Procedures   ____________________________________________   INITIAL IMPRESSION / ASSESSMENT AND PLAN / ED COURSE  Pertinent labs & imaging results that were available during my care of the patient were reviewed by me and considered in my medical decision making (see chart for details).  I reviewed his prior notes in [facility] as well as the [facility] notes through the care everywhere.  He received a bedside ultrasound at [facility] by one of the attendings which showed no gallbladder disease, but given how consistent his symptoms seem with gallbladder disease, I will proceed with a right upper quadrant ultrasound.  If this is unremarkable, I will proceed with the CT scan of his abdomen and pelvis given the persistence of his symptoms and the fact that he has not had any CT scans yet but continues to suffer.  Additionally, he had a nonspecific nodule on his chest x-ray which likely is simply artifact but should be evaluated  further given no specific diagnosis yet for his other symptoms that include abdominal pain radiating through into his chest.   (Note that documentation was delayed due to multiple ED patients requiring immediate care.)   The ultrasound was generally unremarkable and nonacute.  I proceeded with a CT scan with IV contrast of his chest and abdomen/pelvis.  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  There is no diagnosis or finding that necessitates or justifies admission at this time.  I had 2 separate phone conversations with Dr. Mike Gip the oncologist who concurs with my assessment but promises to arrange close outpatient follow-up in the cancer center.  I had a lengthy conversation with the patient and his family members via the hospital Spanish interpreter about the results.  They understand and agree with the plan for close follow-up.  I sent a message to Dr. Mike Gip through [facility] with contact names and phone numbers for English-speaking members of the family who the patient and empowered to speak on his behalf in terms of scheduling follow-up appointments.  The patient remains hemodynamically stable.   ____________________________________________  FINAL CLINICAL IMPRESSION(S) / ED DIAGNOSES  Final diagnoses:  Malignant neoplasm of stomach, unspecified location (HCC)  Abdominal pain, unspecified abdominal location     MEDICATIONS GIVEN DURING THIS VISIT:  Medications  morphine 4 MG/ML injection 4 mg (4 mg Intravenous Not Given 07/29/15 0841)  ondansetron (ZOFRAN-ODT) disintegrating tablet 4 mg (4 mg Oral Given 07/28/15 2219)  diatrizoate meglumine-sodium (GASTROGRAFIN) 66-10 % solution 15 mL (15 mLs Oral Given 07/29/15 0532)  iopamidol (ISOVUE-300) 61 % injection 100 mL (100 mLs Intravenous Contrast Given 07/29/15 0610)     NEW OUTPATIENT MEDICATIONS STARTED DURING THIS VISIT:  New Prescriptions   DOCUSATE SODIUM  (COLACE) 100 MG CAPSULE    Take 1 tablet once or twice daily as needed for constipation while taking narcotic pain medicine   OXYCODONE-ACETAMINOPHEN (ROXICET) 5-325 MG TABLET    Take 1-2 tablets by mouth every 4 (four) hours as needed for severe pain.      Note:  This document was prepared using Dragon voice recognition software and may include unintentional dictation errors.   Hinda Kehr, MD 07/29/15 [phone]

## 2015-07-29 NOTE — ED Notes (Signed)
MD Forbach at bedside. 

## 2015-07-29 NOTE — ED Notes (Signed)
Pt reports his abdominal and back pain increases when eating

## 2015-07-29 NOTE — ED Notes (Signed)
Pt taken to CT.

## 2015-07-29 NOTE — ED Notes (Signed)
Pt taken to [place], accompanied by rafael, interpreter

## 2015-07-31 ENCOUNTER — Inpatient Hospital Stay: Payer: Self-pay | Admitting: Hematology and Oncology

## 2015-07-31 ENCOUNTER — Other Ambulatory Visit: Payer: Self-pay

## 2015-07-31 NOTE — Progress Notes (Signed)
  Oncology Nurse Navigator Documentation  Navigator Location: [facility]-Med Onc (07/31/15 1500) Navigator Encounter Type: Initial MedOnc (07/31/15 1500)   Abnormal Finding Date: 07/30/15 (07/31/15 1500)       Patient Visit Type: Initial (07/31/15 1500) Treatment Phase: Abnormal Scans (07/31/15 1500) Barriers/Navigation Needs:  (Language) (07/31/15 1500)                Acuity: Level 2 (07/31/15 1500)   Acuity Level 2: Initial guidance, education and coordination as needed;Educational needs;Ongoing guidance and education throughout treatment as needed (07/31/15 1500)     Time Spent with Patient: 30 (07/31/15 1500)   Met with Anthony Melendez and his family in MD waiting area this am along with the interpreter. He is havig his appt changed to tomorrow in [place] with Dr Mike Gip. Introduced Therapist, nutritional and provided him with my contact information for future needs following his consult.

## 2015-08-01 ENCOUNTER — Encounter: Payer: Self-pay | Admitting: *Deleted

## 2015-08-01 ENCOUNTER — Inpatient Hospital Stay: Payer: 59 | Attending: Hematology and Oncology | Admitting: Hematology and Oncology

## 2015-08-01 ENCOUNTER — Inpatient Hospital Stay: Payer: Self-pay

## 2015-08-01 ENCOUNTER — Encounter: Payer: Self-pay | Admitting: Hematology and Oncology

## 2015-08-01 DIAGNOSIS — R1013 Epigastric pain: Secondary | ICD-10-CM

## 2015-08-01 DIAGNOSIS — R51 Headache: Secondary | ICD-10-CM | POA: Insufficient documentation

## 2015-08-01 DIAGNOSIS — R5383 Other fatigue: Secondary | ICD-10-CM | POA: Insufficient documentation

## 2015-08-01 DIAGNOSIS — R634 Abnormal weight loss: Secondary | ICD-10-CM

## 2015-08-01 DIAGNOSIS — C801 Malignant (primary) neoplasm, unspecified: Secondary | ICD-10-CM

## 2015-08-01 DIAGNOSIS — K3189 Other diseases of stomach and duodenum: Secondary | ICD-10-CM | POA: Insufficient documentation

## 2015-08-01 DIAGNOSIS — C786 Secondary malignant neoplasm of retroperitoneum and peritoneum: Secondary | ICD-10-CM | POA: Insufficient documentation

## 2015-08-01 DIAGNOSIS — R112 Nausea with vomiting, unspecified: Secondary | ICD-10-CM | POA: Diagnosis not present

## 2015-08-01 DIAGNOSIS — K219 Gastro-esophageal reflux disease without esophagitis: Secondary | ICD-10-CM

## 2015-08-01 DIAGNOSIS — R109 Unspecified abdominal pain: Secondary | ICD-10-CM | POA: Diagnosis not present

## 2015-08-01 DIAGNOSIS — R1900 Intra-abdominal and pelvic swelling, mass and lump, unspecified site: Secondary | ICD-10-CM | POA: Diagnosis not present

## 2015-08-01 DIAGNOSIS — Z79899 Other long term (current) drug therapy: Secondary | ICD-10-CM | POA: Insufficient documentation

## 2015-08-01 LAB — CBC WITH DIFFERENTIAL/PLATELET
Basophils Absolute: 0 10*3/uL (ref 0–0.1)
Basophils Relative: 1 %
Eosinophils Absolute: 0.1 10*3/uL (ref 0–0.7)
Eosinophils Relative: 1 %
HCT: 42.9 % (ref 40.0–52.0)
Hemoglobin: 14.6 g/dL (ref 13.0–18.0)
Lymphocytes Relative: 15 %
Lymphs Abs: 1.2 10*3/uL (ref 1.0–3.6)
MCH: 28 pg (ref 26.0–34.0)
MCHC: 34.1 g/dL (ref 32.0–36.0)
MCV: 82.2 fL (ref 80.0–100.0)
Monocytes Absolute: 0.5 10*3/uL (ref 0.2–1.0)
Monocytes Relative: 6 %
Neutro Abs: 6.2 10*3/uL (ref 1.4–6.5)
Neutrophils Relative %: 77 %
Platelets: 266 10*3/uL (ref 150–440)
RBC: 5.22 MIL/uL (ref 4.40–5.90)
RDW: 12.3 % (ref 11.5–14.5)
WBC: 8 10*3/uL (ref 3.8–10.6)

## 2015-08-01 LAB — CREATININE, SERUM
Creatinine, Ser: 0.88 mg/dL (ref 0.61–1.24)
GFR calc Af Amer: >60 mL/min (ref >60)
GFR calc non Af Amer: >60 mL/min (ref >60)

## 2015-08-01 LAB — APTT: aPTT: 33 seconds (ref 24–36)

## 2015-08-01 LAB — LACTATE DEHYDROGENASE: LDH: 114 U/L (ref 98–192)

## 2015-08-01 LAB — URIC ACID: Uric Acid, Serum: 6.8 mg/dL (ref 4.4–7.6)

## 2015-08-01 LAB — PROTIME-INR
INR: 1.1
Prothrombin Time: 14.2 seconds (ref 11.4–15.0)

## 2015-08-01 NOTE — Progress Notes (Signed)
Pt reports mid abdominal pain, left sided, and lower bi-lateral pain.  Last Saturday had Nausea and vomiting when went to the ER.  Pt reports that he has had this pain for about a month with nausea and vomiting appearing about a week later after onset of pain.  Weight loss of a total of 30 lbs this month.  Has headaches come and go.  Appetite a 100% if he didn't have pain.  Pt reports spicy, fatty, can effect him more, but all food creates pain.  Yesterday reports eating chicken soup and still don't feel good after.  There are sometimes it hurts and sometimes it does not.  He reports he does not have medication for nausea but is interested in having.

## 2015-08-02 ENCOUNTER — Ambulatory Visit
Admission: RE | Admit: 2015-08-02 | Discharge: 2015-08-02 | Disposition: A | Discharge status: Home / Self Care | Payer: 59 | Source: Ambulatory Visit | Attending: Gastroenterology | Admitting: Gastroenterology

## 2015-08-02 ENCOUNTER — Ambulatory Visit: Payer: 59 | Admitting: Anesthesiology

## 2015-08-02 ENCOUNTER — Encounter: Payer: Self-pay | Admitting: *Deleted

## 2015-08-02 ENCOUNTER — Encounter
Admission: RE | Disposition: A | Discharge status: Home / Self Care | Payer: Self-pay | Source: Ambulatory Visit | Attending: Gastroenterology

## 2015-08-02 DIAGNOSIS — D490 Neoplasm of unspecified behavior of digestive system: Secondary | ICD-10-CM | POA: Diagnosis not present

## 2015-08-02 DIAGNOSIS — B9681 Helicobacter pylori [H. pylori] as the cause of diseases classified elsewhere: Secondary | ICD-10-CM | POA: Insufficient documentation

## 2015-08-02 DIAGNOSIS — K2970 Gastritis, unspecified, without bleeding: Secondary | ICD-10-CM

## 2015-08-02 DIAGNOSIS — Z79899 Other long term (current) drug therapy: Secondary | ICD-10-CM | POA: Insufficient documentation

## 2015-08-02 DIAGNOSIS — R933 Abnormal findings on diagnostic imaging of other parts of digestive tract: Secondary | ICD-10-CM

## 2015-08-02 DIAGNOSIS — C786 Secondary malignant neoplasm of retroperitoneum and peritoneum: Secondary | ICD-10-CM | POA: Insufficient documentation

## 2015-08-02 DIAGNOSIS — C169 Malignant neoplasm of stomach, unspecified: Secondary | ICD-10-CM | POA: Insufficient documentation

## 2015-08-02 DIAGNOSIS — R198 Other specified symptoms and signs involving the digestive system and abdomen: Secondary | ICD-10-CM | POA: Insufficient documentation

## 2015-08-02 DIAGNOSIS — K2961 Other gastritis with bleeding: Secondary | ICD-10-CM | POA: Insufficient documentation

## 2015-08-02 DIAGNOSIS — C168 Malignant neoplasm of overlapping sites of stomach: Secondary | ICD-10-CM | POA: Insufficient documentation

## 2015-08-02 HISTORY — PX: ESOPHAGOGASTRODUODENOSCOPY (EGD) WITH PROPOFOL: SHX5813

## 2015-08-02 LAB — CEA: CEA: 0.3 ng/mL (ref 0.0–4.7)

## 2015-08-02 SURGERY — ESOPHAGOGASTRODUODENOSCOPY (EGD) WITH PROPOFOL
Anesthesia: Monitor Anesthesia Care | Wound class: Clean Contaminated

## 2015-08-02 MED ORDER — ACETAMINOPHEN 325 MG PO TABS: 325.0000 mg | ORAL_TABLET | ORAL | Status: DC | PRN

## 2015-08-02 MED ORDER — LIDOCAINE HCL (CARDIAC) 20 MG/ML IV SOLN
INTRAVENOUS | Status: DC | PRN
  Administered 2015-08-02: 50 mg via INTRAVENOUS

## 2015-08-02 MED ORDER — GLYCOPYRROLATE 0.2 MG/ML IJ SOLN
INTRAMUSCULAR | Status: DC | PRN
  Administered 2015-08-02: 0.1 mg via INTRAVENOUS

## 2015-08-02 MED ORDER — PROPOFOL 10 MG/ML IV BOLUS
INTRAVENOUS | Status: DC | PRN
  Administered 2015-08-02: 150 mg via INTRAVENOUS
  Administered 2015-08-02: 20 mg via INTRAVENOUS
  Administered 2015-08-02: 30 mg via INTRAVENOUS

## 2015-08-02 MED ORDER — LACTATED RINGERS IV SOLN
INTRAVENOUS | Status: DC
  Administered 2015-08-02: 11:00:00 via INTRAVENOUS

## 2015-08-02 MED ORDER — STERILE WATER FOR IRRIGATION IR SOLN
Status: DC | PRN
  Administered 2015-08-02: 11:00:00

## 2015-08-02 MED ORDER — ACETAMINOPHEN 160 MG/5ML PO SOLN: 325.0000 mg | ORAL | Status: DC | PRN

## 2015-08-02 SURGICAL SUPPLY — 32 items

## 2015-08-02 NOTE — Discharge Instructions (Signed)
General Anesthesia, Adult, Care After Refer to this sheet in the next few weeks. These instructions provide you with information on caring for yourself after your procedure. Your health care provider may also give you more specific instructions. Your treatment has been planned according to current medical practices, but problems sometimes occur. Call your health care provider if you have any problems or questions after your procedure. WHAT TO EXPECT AFTER THE PROCEDURE After the procedure, it is typical to experience:  Sleepiness.  Nausea and vomiting. HOME CARE INSTRUCTIONS  For the first 24 hours after general anesthesia:  Have a responsible person with you.  Do not drive a car. If you are alone, do not take public transportation.  Do not drink alcohol.  Do not take medicine that has not been prescribed by your health care provider.  Do not sign important papers or make important decisions.  You may resume a normal diet and activities as directed by your health care provider.  Change bandages (dressings) as directed.  If you have questions or problems that seem related to general anesthesia, call the hospital and ask for the anesthetist or anesthesiologist on call. SEEK MEDICAL CARE IF:  You have nausea and vomiting that continue the day after anesthesia.  You develop a rash. SEEK IMMEDIATE MEDICAL CARE IF:   You have difficulty breathing.  You have chest pain.  You have any allergic problems.   This information is not intended to replace advice given to you by your health care provider. Make sure you discuss any questions you have with your health care provider.   Document Released: 04/28/2000 Document Revised: 02/10/2014 Document Reviewed: 05/21/2011 Elsevier Interactive Patient Education 2016 [place] general, adultos, cuidados posteriores (General Anesthesia, Adult, Care After) Siga estas instrucciones durante las prximas semanas. Estas  indicaciones le proporcionan informacin acerca de cmo deber cuidarse despus del procedimiento. El mdico tambin podr darle instrucciones ms especficas. El tratamiento se ha planificado de acuerdo a las prcticas mdicas actuales, pero a veces se producen problemas. Comunquese con el mdico si tiene algn problema o tiene dudas despus del procedimiento. QU ESPERAR DESPUS DEL PROCEDIMIENTO Despus del procedimiento es habitual experimentar:  Somnolencia.  Nuseas y vmitos. INSTRUCCIONES PARA EL CUIDADO EN EL HOGAR  Durante las primeras 24 horas luego de la anestesia general:  Haga que una persona responsable se quede con usted.  No conduzca un automvil. Si est solo, no viaje en transporte pblico.  No beba alcohol.  No tome medicamentos que no le haya recetado su mdico.  No firme documentos importantes ni tome decisiones trascendentes.  Puede reanudar su dieta y sus actividades normales segn le haya indicado el mdico.  Cambie los vendajes (apsitos) tal como se le indic.  Si tiene preguntas o se le presenta algn problema relacionado con la anestesia general, comunquese con el hospital y pida por el anestesista o anestesilogo de [place]. SOLICITE ATENCIN MDICA SI:  Tiene nuseas y Christine posterior a la anestesia.  Le aparece una erupcin cutnea. SOLICITE ATENCIN MDICA DE INMEDIATO SI:   Tiene dificultad para respirar.  Siente dolor en el pecho.  Tiene algn problema alrgico.   Esta informacin no tiene como fin reemplazar el consejo del mdico. Asegrese de hacerle al mdico cualquier pregunta que tenga.   Document Released: 01/20/2005 Document Revised: 02/10/2014 Elsevier Interactive Patient Education [employer].

## 2015-08-02 NOTE — Progress Notes (Signed)
[facility] day:  08/01/2015  Chief Complaint: Anthony Melendez is a 35 y.o. male with gastric wall thickening and peritoneal carcinomatosis who is referred in consultation by Dr. Hinda Kehr for assessment and management.  HPI:  The patient denies any past medical history.  Over the past 4-6 weeks,  he has noted increasing abdominal pain.  Pain is predominantly in the epigastric region, but has recently extended across his upper abdomen and down into his lower abdomen. Pain comes and goes and is worse with eating.  He has had some nausea and vomiting.  He denies any constipation, diarrhea, melena or hematochezia.  Patient has been seen 4 times in the emergency room (twice at [facility] and twice at [facility]).  He has been prescribed antacids and Carafate without improvement in symptoms.  He describes a 30 pound weight loss over the past month secondary to decreased oral intake.  He was seen in the [facility] ER on 07/29/2015.  Because of persistent symptoms, imaging studies were performed.  Chest, abdomen and pelvic CT scan on 07/29/2015 revealed a 4 cm thickened and irregular gastric wall compatible with an infiltrative malignancy. There was extensive involvement of the upper mesentery and omentum compatible with peritoneal carcinomatosis. There was a small amount of ascites. There was no evidence of obstruction or intrathoracic pathology.  CBC on 07/28/2015 revealed a hematocrit of 41.6, hemoglobin 14.3, platelets 269,000, white count 7500.  Comprehensive metabolic panel was normal.  He denies any family history of malignancy.  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    Symptomatically, the patient notes ongoing intermittent abdominal pain.  He typically uses 1-2 oxycodone a day.  He notes new headaches.  He denies any numbness, weakness, balance or coordination issues.   Past Medical History  Diagnosis Date  . GERD (gastroesophageal reflux disease)     Past  Surgical History  Procedure Laterality Date  . No past surgeries      History reviewed. No pertinent family history.  Social History:  reports that he has never smoked. He does not have any smokeless tobacco history on file. He reports that he does not drink alcohol or use illicit drugs.  The patient is from [place].  He speaks [place].  He moves to the [place] in 2000.  He lives in [place] with his father, mother, brother and sister.  He is a Theme park manager.  The patient is accompanied by his brother and father today.  Interpreter services via teleconferencing were obtained.  Allergies: No Known Allergies  Current Medications: Current Outpatient Prescriptions  Medication Sig Dispense Refill  . oxyCODONE-acetaminophen (ROXICET) 5-325 MG tablet Take 1-2 tablets by mouth every 4 (four) hours as needed for severe pain. 30 tablet 0  . docusate sodium (COLACE) 100 MG capsule Take 1 tablet once or twice daily as needed for constipation while taking narcotic pain medicine (Patient not taking: Reported on 08/01/2015) 30 capsule 0  . famotidine (PEPCID) 20 MG tablet Take 1 tablet (20 mg total) by mouth 2 (two) times daily. (Patient not taking: Reported on 08/01/2015) 60 tablet 1  . sucralfate (CARAFATE) 1 g tablet Take 1 tablet (1 g total) by mouth 4 (four) times daily. (Patient not taking: Reported on 08/01/2015) 120 tablet 1   No current facility-administered medications for this visit.    Review of Systems:  GENERAL:  Fatigue.  No fevers or sweats.  Weight loss of 30 pounds in the past month. PERFORMANCE STATUS (ECOG):  1 HEENT:  No visual changes, runny nose, sore throat, mouth sores or tenderness. Lungs: No shortness of breath or cough.  No hemoptysis. Cardiac:  No chest pain, palpitations, orthopnea, or PND. GI:   Abdominal pain (see HPI).  Nausea and vomiting.  No diarrhea, constipation, melena or hematochezia. GU:  No urgency, frequency, dysuria, or hematuria. Musculoskeletal:  Chest and   back pain.  No joint pain.  No muscle tenderness. Extremities:  No pain or swelling. Skin:  No rashes or skin changes. Neuro:  Headache.  No numbness or weakness, balance or coordination issues. Endocrine:  No diabetes, thyroid issues, hot flashes or night sweats. Psych:  No mood changes, depression or anxiety. Pain:  No focal pain. Review of systems:  All other systems reviewed and found to be negative.  Physical Exam: Blood pressure 106/72, pulse 69, temperature 97.8 F (36.6 C), temperature source Tympanic, resp. rate 18, height _0  (1.651 m), weight 184 lb 4.9 oz (83.6 kg). GENERAL:  Well developed, well nourished, gentleman sitting comfortably in the exam room in no acute distress. MENTAL STATUS:  Alert and oriented to person, place and time. HEAD:  Wearing a baseball cap.  Black hair.  Mustache.  Normocephalic, atraumatic, face symmetric, no Cushingoid features. EYES:  Brown eyes.  Pupils equal round and reactive to light and accomodation.  No conjunctivitis or scleral icterus. ENT:  Oropharynx clear without lesion.  Tongue normal. Mucous membranes moist.  RESPIRATORY:  Clear to auscultation without rales, wheezes or rhonchi. CARDIOVASCULAR:  Regular rate and rhythm without murmur, rub or gallop. ABDOMEN:  Soft, tender in the epigastric region, but also across upper abdomen and left side of abdomen.  No guarding or rebound tenderness.  Active bowel sounds, and no hepatosplenomegaly.  No masses. SKIN:  No rashes, ulcers or lesions. EXTREMITIES: No edema, no skin discoloration or tenderness.  No palpable cords. LYMPH NODES: No palpable cervical, supraclavicular, axillary or inguinal adenopathy  NEUROLOGICAL: Alert & oriented, cranial nerves II-XII intact; motor strength 5/5 throughout; sensation intact; finger to nose and RAM normal; able to walk heel to toe; negative Rhomberg; no clonus or Babinski. PSYCH:  Appropriate.    Office Visit on 08/01/2015  Component Date Value Ref Range  Status  . Creatinine, Ser 08/01/2015 0.88  0.61 - 1.24 mg/dL Final  . GFR calc non Af Amer 08/01/2015 >60  >60 mL/min Final  . GFR calc Af Amer 08/01/2015 >60  >60 mL/min Final   Comment: (NOTE) The eGFR has been calculated using the CKD EPI equation. This calculation has not been validated in all clinical situations. eGFR's persistently <60 mL/min signify possible Chronic Kidney Disease.   . WBC 08/01/2015 8.0  3.8 - 10.6 K/uL Final   A-LINE DRAW  . RBC 08/01/2015 5.22  4.40 - 5.90 MIL/uL Final  . Hemoglobin 08/01/2015 14.6  13.0 - 18.0 g/dL Final  . HCT 08/01/2015 42.9  40.0 - 52.0 % Final  . MCV 08/01/2015 82.2  80.0 - 100.0 fL Final  . MCH 08/01/2015 28.0  26.0 - 34.0 pg Final  . MCHC 08/01/2015 34.1  32.0 - 36.0 g/dL Final  . RDW 08/01/2015 12.3  11.5 - 14.5 % Final  . Platelets 08/01/2015 266  150 - 440 K/uL Final  . Neutrophils Relative % 08/01/2015 77%   Final  . Neutro Abs 08/01/2015 6.2  1.4 - 6.5 K/uL Final  . Lymphocytes Relative 08/01/2015 15%   Final  . Lymphs Abs 08/01/2015 1.2  1.0 - 3.6 K/uL Final  .  Monocytes Relative 08/01/2015 6%   Final  . Monocytes Absolute 08/01/2015 0.5  0.2 - 1.0 K/uL Final  . Eosinophils Relative 08/01/2015 1%   Final  . Eosinophils Absolute 08/01/2015 0.1  0 - 0.7 K/uL Final  . Basophils Relative 08/01/2015 1%   Final  . Basophils Absolute 08/01/2015 0.0  0 - 0.1 K/uL Final  . LDH 08/01/2015 114  98 - 192 U/L Final  . Uric Acid, Serum 08/01/2015 6.8  4.4 - 7.6 mg/dL Final  . Prothrombin Time 08/01/2015 14.2  11.4 - 15.0 seconds Final   A-LINE DRAW  . INR 08/01/2015 1.10   Final  . aPTT 08/01/2015 33  24 - 36 seconds Final    Assessment:  Shamir Tuzzolino is a 35 y.o. 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  He presented with a 4-6 week history of progressive epigastric pain.   Chest, abdomen and pelvic CT scan on 07/29/2015 revealed a 4 cm thickened and irregular gastric wall compatible with an infiltrative malignancy. There  was extensive involvement of the upper mesentery and omentum compatible with peritoneal carcinomatosis. There was a small amount of ascites.   Symptomatically, he has abdominal pain aggravated by eating.  He has lost 30 pounds in the past month.  He has new headaches.  Neurologic exam is normal.  Plan: 1.  Review imaging studies with patient.  Discuss concern for malignancy (gastric cancer or lymphoma).  Discuss plan for EGD and biopsy.  Discuss plan for chemotherapy based on pathology.  Discuss plan for labs and head CT. 2.  Discuss pain management.  Discuss taking pain medication 30-45 minutes prior to eating.  Discuss importance of caloric and fluid intake. 3.  Labs today:  CBC with diff, LDH, uric acid, CEA, Cr, PT, PTT. 4.  EGD with Dr Allen Norris on 08/02/2015. 5.  RTC on 08/08/2015 for MD assessment, review of labs, pathology, and imaging studies, and discussion regarding direction of therapy.   Lequita Asal, MD  08/01/2015

## 2015-08-02 NOTE — Transfer of Care (Signed)
Immediate Anesthesia Transfer of Care Note  Patient: Anthony Melendez  Procedure(s) Performed: Procedure(s): ESOPHAGOGASTRODUODENOSCOPY (EGD) WITH PROPOFOL (N/A)  Patient Location: PACU  Anesthesia Type: MAC  Level of Consciousness: awake, alert  and patient cooperative  Airway and Oxygen Therapy: Patient Spontanous Breathing and Patient connected to supplemental oxygen  Post-op Assessment: Post-op Vital signs reviewed, Patient's Cardiovascular Status Stable, Respiratory Function Stable, Patent Airway and No signs of Nausea or vomiting  Post-op Vital Signs: Reviewed and stable  Complications: No apparent anesthesia complications

## 2015-08-02 NOTE — Op Note (Signed)
[facility] Gastroenterology Patient Name: Anthony Melendez Procedure Date: 08/02/2015 11:23 AM MRN: [id number] Account #: [id number] Date of Birth: 16-Aug-1980 Admit Type: Outpatient Age: 35 Room: [facility] OR ROOM 01 Gender: Male Note Status: Finalized Procedure:            Upper GI endoscopy Indications:          Abnormal CT of the GI tract Providers:            Lucilla Lame, MD Medicines:            Propofol per Anesthesia Complications:        No immediate complications. Procedure:            Pre-Anesthesia Assessment:                       - Prior to the procedure, a History and Physical was                        performed, and patient medications and allergies were                        reviewed. The patient's tolerance of previous                        anesthesia was also reviewed. The risks and benefits of                        the procedure and the sedation options and risks were                        discussed with the patient. All questions were                        answered, and informed consent was obtained. Prior                        Anticoagulants: The patient has taken no previous                        anticoagulant or antiplatelet agents. ASA Grade                        Assessment: II - A patient with mild systemic disease.                        After reviewing the risks and benefits, the patient was                        deemed in satisfactory condition to undergo the                        procedure.                       After obtaining informed consent, the endoscope was                        passed under direct vision. Throughout the procedure,                        the patient's  blood pressure, pulse, and oxygen                        saturations were monitored continuously. The Olympus                        GIF [id number] colonscope [id number] was introduced                        through the mouth, and advanced to the second part of                         duodenum. The upper GI endoscopy was accomplished                        without difficulty. The patient tolerated the procedure                        well. Findings:      The examined esophagus was normal.      A large, ulcerated, non-circumferential mass with oozing bleeding and       stigmata of recent bleeding was found in the entire examined stomach.       Biopsies were taken with a cold forceps for histology.      The examined duodenum was normal.      Mass in involving the fundus down to the antrum. Impression:           - Normal esophagus.                       - Rule out malignancy, gastric tumor stomach. Biopsied.                       - Normal examined duodenum.                       - Mass in involving the fundus down to the antrum. Recommendation:       - Await pathology results. Procedure Code(s):    --- Professional ---                       [phone], Esophagogastroduodenoscopy, flexible, transoral;                        with biopsy, single or multiple Diagnosis Code(s):    --- Professional ---                       R93.3, Abnormal findings on diagnostic imaging of other                        parts of digestive tract                       D49.0, Neoplasm of unspecified behavior of digestive                        system CPT copyright 2016 American Medical Association. All rights reserved. The codes documented in this report are preliminary and upon coder review may  be revised to meet current compliance requirements. Lucilla Lame, MD 08/02/2015 11:33:04 AM This report has been signed electronically. Number of Addenda:  0 Note Initiated On: 08/02/2015 11:23 AM Total Procedure Duration: 0 hours 2 minutes 58 seconds       [facility]

## 2015-08-02 NOTE — Anesthesia Procedure Notes (Signed)
Procedure Name: MAC Date/Time: 08/02/2015 11:22 AM Performed by: Cameron Ali Pre-anesthesia Checklist: Patient identified, Emergency Drugs available, Suction available, Timeout performed and Patient being monitored Patient Re-evaluated:Patient Re-evaluated prior to inductionOxygen Delivery Method: Nasal cannula Placement Confirmation: positive ETCO2

## 2015-08-02 NOTE — Anesthesia Preprocedure Evaluation (Signed)
Anesthesia Evaluation  Patient identified by MRN, date of birth, ID band  Reviewed: Allergy & Precautions, H&P , NPO status , Patient's Chart, lab work & pertinent test results  Airway Mallampati: II  TM Distance: >3 FB Neck ROM: full    Dental no notable dental hx.    Pulmonary    Pulmonary exam normal        Cardiovascular  Rhythm:regular Rate:Normal     Neuro/Psych    GI/Hepatic GERD  ,  Endo/Other    Renal/GU      Musculoskeletal   Abdominal   Peds  Hematology   Anesthesia Other Findings   Reproductive/Obstetrics                             Anesthesia Physical Anesthesia Plan  ASA: II  Anesthesia Plan: MAC   Post-op Pain Management:    Induction:   Airway Management Planned:   Additional Equipment:   Intra-op Plan:   Post-operative Plan:   Informed Consent: I have reviewed the patients History and Physical, chart, labs and discussed the procedure including the risks, benefits and alternatives for the proposed anesthesia with the patient or authorized representative who has indicated his/her understanding and acceptance.     Plan Discussed with: CRNA  Anesthesia Plan Comments:         Anesthesia Quick Evaluation  

## 2015-08-02 NOTE — Anesthesia Postprocedure Evaluation (Signed)
Anesthesia Post Note  Patient: Sherrel Wallman  Procedure(s) Performed: Procedure(s) (LRB): ESOPHAGOGASTRODUODENOSCOPY (EGD) WITH PROPOFOL (N/A)  Patient location during evaluation: PACU Anesthesia Type: MAC Level of consciousness: awake and alert and oriented Pain management: satisfactory to patient Vital Signs Assessment: post-procedure vital signs reviewed and stable Respiratory status: spontaneous breathing, nonlabored ventilation and respiratory function stable Cardiovascular status: blood pressure returned to baseline and stable Postop Assessment: Adequate PO intake and No signs of nausea or vomiting Anesthetic complications: no    Raliegh Ip

## 2015-08-02 NOTE — H&P (Signed)
  Lucilla Lame, MD [address] [place], [place] [address] Phone: [phone] Fax : [phone]  Primary Care Physician:  No PCP Per Patient Primary Gastroenterologist:  Dr. Allen Norris  Pre-Procedure History & Physical: HPI:  Anthony Melendez is a 35 y.o. male is here for an endoscopy.   Past Medical History  Diagnosis Date  . GERD (gastroesophageal reflux disease)     Past Surgical History  Procedure Laterality Date  . No past surgeries      Prior to Admission medications   Medication Sig Start Date End Date Taking? Authorizing Provider  docusate sodium (COLACE) 100 MG capsule Take 1 tablet once or twice daily as needed for constipation while taking narcotic pain medicine 07/29/15  Yes Hinda Kehr, MD  oxyCODONE-acetaminophen (ROXICET) 5-325 MG tablet Take 1-2 tablets by mouth every 4 (four) hours as needed for severe pain. 07/29/15  Yes Hinda Kehr, MD  famotidine (PEPCID) 20 MG tablet Take 1 tablet (20 mg total) by mouth 2 (two) times daily. Patient not taking: Reported on 08/01/2015 07/05/15   Earleen Newport, MD  sucralfate (CARAFATE) 1 g tablet Take 1 tablet (1 g total) by mouth 4 (four) times daily. Patient not taking: Reported on 08/01/2015 07/05/15 07/04/16  Earleen Newport, MD    Allergies as of 07/31/2015  . (No Known Allergies)    History reviewed. No pertinent family history.  Social History   Social History  . Marital Status: Single    Spouse Name: N/A  . Number of Children: N/A  . Years of Education: N/A   Occupational History  . Not on file.   Social History Main Topics  . Smoking status: Never Smoker   . Smokeless tobacco: Not on file  . Alcohol Use: No  . Drug Use: No  . Sexual Activity: Not on file   Other Topics Concern  . Not on file   Social History Narrative    Review of Systems: See HPI, otherwise negative ROS  Physical Exam: BP 127/93 mmHg  Pulse 82  Temp(Src) 99.1 F (37.3 C)  Resp 16  Ht 5\' 5"  (1.651 m)  Wt  184 lb (83.462 kg)  BMI 30.62 kg/m2  SpO2 99% General:   Alert,  pleasant and cooperative in NAD Head:  Normocephalic and atraumatic. Neck:  Supple; no masses or thyromegaly. Lungs:  Clear throughout to auscultation.    Heart:  Regular rate and rhythm. Abdomen:  Soft, nontender and nondistended. Normal bowel sounds, without guarding, and without rebound.   Neurologic:  Alert and  oriented x4;  grossly normal neurologically.  Impression/Plan: [name] is here for an endoscopy to be performed for Abnomal CT   Risks, benefits, limitations, and alternatives regarding  endoscopy have been reviewed with the patient.  Questions have been answered.  All parties agreeable.   Lucilla Lame, MD  08/02/2015, 10:37 AM

## 2015-08-03 ENCOUNTER — Encounter: Payer: Self-pay | Admitting: Gastroenterology

## 2015-08-04 DIAGNOSIS — C169 Malignant neoplasm of stomach, unspecified: Secondary | ICD-10-CM

## 2015-08-04 HISTORY — DX: Malignant neoplasm of stomach, unspecified: C16.9

## 2015-08-07 ENCOUNTER — Other Ambulatory Visit: Payer: Self-pay | Admitting: Hematology and Oncology

## 2015-08-07 DIAGNOSIS — K2970 Gastritis, unspecified, without bleeding: Secondary | ICD-10-CM

## 2015-08-07 DIAGNOSIS — B9681 Helicobacter pylori [H. pylori] as the cause of diseases classified elsewhere: Secondary | ICD-10-CM

## 2015-08-07 DIAGNOSIS — C168 Malignant neoplasm of overlapping sites of stomach: Secondary | ICD-10-CM

## 2015-08-07 DIAGNOSIS — C162 Malignant neoplasm of body of stomach: Secondary | ICD-10-CM | POA: Insufficient documentation

## 2015-08-08 ENCOUNTER — Inpatient Hospital Stay: Payer: 59 | Attending: Hematology and Oncology

## 2015-08-08 ENCOUNTER — Inpatient Hospital Stay (HOSPITAL_BASED_OUTPATIENT_CLINIC_OR_DEPARTMENT_OTHER): Payer: 59 | Admitting: Hematology and Oncology

## 2015-08-08 ENCOUNTER — Encounter: Payer: Self-pay | Admitting: Hematology and Oncology

## 2015-08-08 ENCOUNTER — Ambulatory Visit
Admission: RE | Admit: 2015-08-08 | Discharge: 2015-08-08 | Disposition: A | Discharge status: Home / Self Care | Payer: Self-pay | Source: Ambulatory Visit | Attending: Hematology and Oncology | Admitting: Hematology and Oncology

## 2015-08-08 VITALS — Wt 181.3 lb

## 2015-08-08 DIAGNOSIS — Z79899 Other long term (current) drug therapy: Secondary | ICD-10-CM

## 2015-08-08 DIAGNOSIS — R1032 Left lower quadrant pain: Secondary | ICD-10-CM | POA: Insufficient documentation

## 2015-08-08 DIAGNOSIS — E876 Hypokalemia: Secondary | ICD-10-CM | POA: Insufficient documentation

## 2015-08-08 DIAGNOSIS — K2970 Gastritis, unspecified, without bleeding: Secondary | ICD-10-CM | POA: Insufficient documentation

## 2015-08-08 DIAGNOSIS — R51 Headache: Secondary | ICD-10-CM | POA: Insufficient documentation

## 2015-08-08 DIAGNOSIS — R634 Abnormal weight loss: Secondary | ICD-10-CM | POA: Insufficient documentation

## 2015-08-08 DIAGNOSIS — C786 Secondary malignant neoplasm of retroperitoneum and peritoneum: Secondary | ICD-10-CM | POA: Diagnosis not present

## 2015-08-08 DIAGNOSIS — K219 Gastro-esophageal reflux disease without esophagitis: Secondary | ICD-10-CM | POA: Insufficient documentation

## 2015-08-08 DIAGNOSIS — C168 Malignant neoplasm of overlapping sites of stomach: Secondary | ICD-10-CM | POA: Diagnosis not present

## 2015-08-08 DIAGNOSIS — B9681 Helicobacter pylori [H. pylori] as the cause of diseases classified elsewhere: Secondary | ICD-10-CM

## 2015-08-08 DIAGNOSIS — C801 Malignant (primary) neoplasm, unspecified: Secondary | ICD-10-CM | POA: Insufficient documentation

## 2015-08-08 DIAGNOSIS — R109 Unspecified abdominal pain: Secondary | ICD-10-CM | POA: Insufficient documentation

## 2015-08-08 DIAGNOSIS — K3189 Other diseases of stomach and duodenum: Secondary | ICD-10-CM | POA: Insufficient documentation

## 2015-08-08 DIAGNOSIS — Z5111 Encounter for antineoplastic chemotherapy: Secondary | ICD-10-CM | POA: Insufficient documentation

## 2015-08-08 LAB — COMPREHENSIVE METABOLIC PANEL
ALT: 17 U/L (ref 17–63)
AST: 20 U/L (ref 15–41)
Albumin: 4.2 g/dL (ref 3.5–5.0)
Alkaline Phosphatase: 83 U/L (ref 38–126)
Anion gap: 9 (ref 5–15)
BUN: 13 mg/dL (ref 6–20)
CO2: 28 mmol/L (ref 22–32)
Calcium: 9 mg/dL (ref 8.9–10.3)
Chloride: 102 mmol/L (ref 101–111)
Creatinine, Ser: 0.94 mg/dL (ref 0.61–1.24)
GFR calc Af Amer: >60 mL/min (ref >60)
GFR calc non Af Amer: >60 mL/min (ref >60)
Glucose, Bld: 88 mg/dL (ref 65–99)
Potassium: 3.7 mmol/L (ref 3.5–5.1)
Sodium: 139 mmol/L (ref 135–145)
Total Bilirubin: 0.6 mg/dL (ref 0.3–1.2)
Total Protein: 8 g/dL (ref 6.5–8.1)

## 2015-08-08 LAB — CBC WITH DIFFERENTIAL/PLATELET
Basophils Absolute: 0.1 10*3/uL (ref 0–0.1)
Basophils Relative: 1 %
Eosinophils Absolute: 0.1 10*3/uL (ref 0–0.7)
Eosinophils Relative: 1 %
HCT: 45.1 % (ref 40.0–52.0)
Hemoglobin: 15.1 g/dL (ref 13.0–18.0)
Lymphocytes Relative: 15 %
Lymphs Abs: 1.3 10*3/uL (ref 1.0–3.6)
MCH: 27.6 pg (ref 26.0–34.0)
MCHC: 33.6 g/dL (ref 32.0–36.0)
MCV: 82.1 fL (ref 80.0–100.0)
Monocytes Absolute: 0.5 10*3/uL (ref 0.2–1.0)
Monocytes Relative: 6 %
Neutro Abs: 6.8 10*3/uL — ABNORMAL HIGH (ref 1.4–6.5)
Neutrophils Relative %: 77 %
Platelets: 286 10*3/uL (ref 150–440)
RBC: 5.49 MIL/uL (ref 4.40–5.90)
RDW: 12.2 % (ref 11.5–14.5)
WBC: 8.7 10*3/uL (ref 3.8–10.6)

## 2015-08-08 LAB — MAGNESIUM: Magnesium: 2 mg/dL (ref 1.7–2.4)

## 2015-08-08 MED ORDER — IOPAMIDOL (ISOVUE-300) INJECTION 61%
75.0000 mL | Freq: Once | INTRAVENOUS | Status: AC | PRN
  Administered 2015-08-08: 75 mL via INTRAVENOUS

## 2015-08-08 NOTE — Patient Instructions (Signed)
Amoxicillin; Clarithromycin; Omeprazole capsules and tablets Qu es este medicamento? AMOXICILINA; CLARITROMICINA; OMEPRAZOL es una combinacin de tres medicamentos en que se [place] para el tratamiento de lceras asociadas con una infeccin bacteriana. Este medicamento puede ser utilizado para otros usos; si tiene alguna pregunta consulte con su proveedor de atencin mdica o con su farmacutico. Qu le debo informar a mi profesional de la salud antes de tomar este medicamento? Necesita saber si usted presenta alguno de los siguientes problemas o situaciones: otras enfermedades crnicas enfermedad renal enfermedad heptica una reaccin alrgica o inusual a la amoxicilina, claritromicina, omeprazol, otros antibiticos, otros medicamentos, alimentos, colorantes o conservantes si est embarazada o buscando quedar embarazada si est amamantando a un beb Cmo debo utilizar este medicamento? Tome estos medicamentos por va oral con un vaso lleno de agua. Cada dosis debe [employer] por da antes de comer. Siga las instrucciones de la etiqueta del [place]. [place] cpsulas y tabletas enteras. No las triture ni mastique. Tome sus dosis a intervalos regulares. No tome su medicamento con una frecuencia mayor a la indicada. No omita ninguna dosis o suspenda el uso de su medicamento antes de lo indicado aun si se siente mejor. No deje de tomarlo excepto si as lo indica su mdico. Hable con su pediatra para informarse acerca del uso de este medicamento en nios. Puede requerir atencin especial. Sobredosis: Pngase en contacto inmediatamente con un centro toxicolgico o una sala de urgencia si usted cree que haya tomado demasiado medicamento. ATENCIN: [employer] es solo para usted. No comparta este medicamento con nadie. Qu sucede si me olvido de una dosis? Si se olvida una dosis, tmela lo antes posible. Si es casi la hora de la prxima dosis, tome slo esa dosis. No tome dosis adicionales o  dobles. Qu puede interactuar con este medicamento? No tome esta medicina con ninguno de los siguientes medicamentos: atazanavir cisapride colchicina dihidroergotamina ergotamina algunos medicamentos para el corazn, tales como, amiodarona, bepridil, dofetilida, droperidol, flecainida, ibutilida, procainamida, quinidina, sotalol algunos medicamentos para el colesterol, tales como cerviastatina, lovastatina, simvastatina medicamentos para la ansiedad, tales como alprazolam, triazolam o midazolam nelfinavir pimozida levadura roja de arroz rifampicina hierba de [place] Esta medicina tambin puede Counselling psychologist con los siguientes medicamentos: carbamazepina cilostazol ciclosporina digoxina diltiazem disulfiram hormonas femeninas, como estrgenos, progestinas y pldoras anticonceptivas suplementos de hierro otros antibiticos otros medicamentos para la depresin, dormir o trastornos psicticos probenecid sildenafil algunos medicamentos para infecciones micoticas, tales como itraconazol, quetoconazol algunos medicamentos para el tratamiento de las infecciones por VIH o SIDA tacrolimo teofilina warfarina Puede ser que esta lista no menciona todas las posibles interacciones. Informe a su profesional de KB Home	[place] de [employer] productos a base de hierbas, medicamentos de [place] o suplementos nutritivos que est tomando. Si usted fuma, consume bebidas alcohlicas o si utiliza drogas ilegales, indqueselo tambin a su profesional de KB Home	[place]. Algunas sustancias pueden interactuar con su medicamento. A qu debo estar atento al usar Coca-Cola? Visite a su mdico o a su profesional de la salud para chequeos peridicos. Si los sntomas no mejoran o si empeoran, consulte con su mdico. Si experimenta fiebre, diarrea acuosa, dolores de [place] o vmito, consulte a su mdico tan pronto como sea posible. stos pueden ser sntomas de una enfermedad ms grave. No se trate usted mismo. Consulte a su mdico para  asesoramiento. Las pldoras anticonceptivas pueden no actuar correctamente mientras est utilizando Coca-Cola. Consulte a su mdico acerca de un mtodo anticonceptivo adicional. Si es diabtico podr  obtener un resultado positivo falso en los [employer] de determinacin del nivel de Location manager en la orina. Consulte con su proveedor de atencin mdico. Qu efectos secundarios puedo tener al [employer] este medicamento? Efectos secundarios que debe informar a su mdico o a Barrister's clerk de la salud tan pronto como sea posible: Chief of Staff como erupcin cutnea, picazn o urticarias, hinchazn de la cara, labios o lengua diarrea acuosa o con sangre dolor de [place], msculo o articulaciones problemas respiratorios mareos, confusin fiebre, escalofros enrojecimiento, formacin de ampollas, descamacin o distensin de la piel, inclusive dentro de la boca calambres, dolores estomacales dificultad para Garment/textile technologist o cambios en el volumen de orina sangrado, magulladuras inusuales cansancio o debilidad inusual picazn, flujo vaginal vmito Efectos secundarios que, por lo general, no requieren atencin mdica (debe informarlos a su mdico o a su profesional de la salud si persisten o si son molestos): diarrea boca seca, sed dolor de cabeza prdida del apetito Higher education careers adviser, nuseas sabor inusual Puede ser que esta lista no menciona todos los posibles efectos secundarios. Comunquese a su mdico por asesoramiento mdico [employer]. Usted puede informar los efectos secundarios a la FDA por telfono al 1-800-FDA-1088. Dnde debo guardar mi medicina? Mantngala fuera del alcance de los nios. Gurdela a [employer], entre 20 y 76 grados C (43 y 89 grados F). Protjala de la luz y de la humedad. Deseche todo el medicamento que no haya utilizado, despus de la fecha de vencimiento. ATENCIN: Este folleto es un resumen. Puede ser que no cubra toda la posible informacin. Si usted  tiene preguntas acerca de esta medicina, consulte con su mdico, su farmacutico o su profesional de Technical sales engineer.    2016, Elsevier/Gold Standard. (2014-03-15 00:00:00) Amoxicillin capsules or tablets Qu es este medicamento? La AMOXICILINA es un antibitico penicilnico. Se utiliza en el tratamiento de ciertos tipos de infecciones bacterianas. No es efectivo para resfros, gripe u otras infecciones de origen viral. Este medicamento puede ser utilizado para otros usos; si tiene alguna pregunta consulte con su proveedor de atencin mdica o con su farmacutico. Qu le debo informar a mi profesional de la salud antes de tomar este medicamento? Necesita saber si usted presenta alguno de los siguientes problemas o situaciones: -asma -enfermedad renal -una reaccin alrgica o inusual a la amoxicilina, otras penicilinas, antibiticos cefalospornicos, otros medicamentos, alimentos, colorantes o conservadores -si est embarazada o buscando quedar embarazada -si est amamantando a un beb Cmo debo utilizar este medicamento? Tome este medicamento por va oral con un vaso de agua. Siga las instrucciones de la etiqueta del [place]. Este medicamento se puede tomar con alimentos o con el [place] sus dosis a intervalos regulares. No tome su medicamento con una frecuencia mayor a la indicada. Complete todo el tratamiento con el medicamento segn lo haya recetado su mdico aun si considera que su problema ha mejorado. No omita ninguna dosis o suspenda el uso de su medicamento antes de lo indicado. Hable con su pediatra para informarse acerca del uso de este medicamento en nios. Aunque este medicamento se puede recetar para condiciones selectivas, las precauciones se aplican. Sobredosis: Pngase en contacto inmediatamente con un centro toxicolgico o una sala de urgencia si usted cree que haya tomado demasiado medicamento. ATENCIN: [employer] es solo para usted. No comparta este  medicamento con nadie. Qu sucede si me olvido de una dosis? Si olvida una dosis, tmela lo antes posible. Si es casi la hora de la prxima dosis, tome slo esa dosis. No tome  dosis adicionales o dobles. Qu puede interactuar con este medicamento? -amilorida -pldoras anticonceptivas -cloranfenicol -macrlidos -probenecid -sulfonamidas -tetraciclina Puede ser que esta lista no menciona todas las posibles interacciones. Informe a su profesional de KB Home	[place] de [employer] productos a base de hierbas, medicamentos de [place] o suplementos nutritivos que est tomando. Si usted fuma, consume bebidas alcohlicas o si utiliza drogas ilegales, indqueselo tambin a su profesional de KB Home	[place]. Algunas sustancias pueden interactuar con su medicamento. A qu debo estar atento al usar Coca-Cola? Si los sntomas no mejoran a los 2  3 das, consulte con su mdico o con su profesional de KB Home	[place]. Tome todas las dosis de su medicamento como se le haya indicado. No omita ninguna dosis o suspenda el uso de su medicamento antes de lo indicado. Si es diabtico podr Therapist, music un resultado positivo falso en los [employer] de determinacin del nivel de azcar en la orina con algunas marcas de pruebas de [place]. Consulte con su mdico. No trate la diarrea con productos de venta libre. Comunquese con su mdico si tiene diarrea que dura ms de 2 das o si es severa y [place]. Qu efectos secundarios puedo tener al [employer] este medicamento? Efectos secundarios que debe informar a su mdico o a Barrister's clerk de la salud tan pronto como sea posible: -Chief of Staff como erupcin cutnea, picazn o urticarias, hinchazn de la cara, labios o lengua -problemas respiratorios -orina de color amarillo oscuro -enrojecimiento, formacin de ampollas, descamacin o aflojamiento de la piel, inclusive dentro de la boca -convulsiones -diarrea severa o acuosa -dificultad para orinar o cambios en el volumen de  orina -sangrado o magulladuras inusuales -cansancio o debilidad inusual -color amarillento de ojos o piel Efectos secundarios que, por lo general, no requieren atencin mdica (debe informarlos a su mdico o a su profesional de la salud si persisten o si son molestos): -mareos -dolor de cabeza -Higher education careers adviser -dificultad para dormir Puede ser que esta lista no menciona todos los posibles efectos secundarios. Comunquese a su mdico por asesoramiento mdico [employer]. Usted puede informar los efectos secundarios a la FDA por telfono al 1-800-FDA-1088. Dnde debo guardar mi medicina? Mantngala fuera del alcance de los nios. Gurdela a temperatura, entre 20 y 76 grados C (23 y 3 grados F). Mantenga el envase bien cerrado. Deseche todo el medicamento que no haya utilizado, despus de la fecha de vencimiento. ATENCIN: Este folleto es un resumen. Puede ser que no cubra toda la posible informacin. Si usted tiene preguntas acerca de esta medicina, consulte con su mdico, su farmacutico o su profesional de Technical sales engineer.    2016, Elsevier/Gold Standard. (2014-03-14 00:00:00) Prochlorperazine tablets Qu es este medicamento? La PROCLORPERAZINA ayuda a controlar las nuseas y Theme park manager. Este medicamento tambin se [place] para tratar esquizofrenia. Tambin se [employer] de la ansiedad no psictica. Este medicamento puede ser utilizado para otros usos; si tiene alguna pregunta consulte con su proveedor de atencin mdica o con su farmacutico. Qu le debo informar a mi profesional de la salud antes de tomar este medicamento? Necesita saber si usted presenta alguno de los siguientes problemas o situaciones: -trastornos o enfermedad sangunea -demencia -enfermedad heptica o ictericia -enfermedad de Parkinson -trastorno de movimiento incontrolable -una reaccin alrgica o inusual a la proclorperazina, a otros medicamentos, alimentos, colorantes o  conservantes -si est embarazada o buscando quedar embarazada -si est amamantando a un beb Cmo debo utilizar este medicamento? Tome este medicamento por va oral con un vaso  de agua. Siga las instrucciones de la etiqueta del [place]. Tome sus dosis a intervalos regulares. No tome su medicamento con una frecuencia mayor a la indicada. No suspenda el medicamento de [place] abrupta ya que esto puede causar nuseas, vmito y [place]. Consulte a mdico o a su profesional de la salud por asesoramiento. Hable con su pediatra para informarse acerca del uso de este medicamento en nios. Puede requerir atencin especial. Aunque este medicamento ha sido recetado a nios tan menores como de 2 aos de edad para condiciones selectivas, las precauciones se aplican. Sobredosis: Pngase en contacto inmediatamente con un centro toxicolgico o una sala de urgencia si usted cree que haya tomado demasiado medicamento. ATENCIN: [employer] es solo para usted. No comparta este medicamento con nadie. Qu sucede si me olvido de una dosis? Si olvida una dosis, tmela lo antes posible. Si es casi la hora de la prxima dosis, tome slo esa dosis. No tome dosis adicionales o dobles. Qu puede interactuar con este medicamento? No tome esta medicina con ninguno de los siguientes medicamentos: -amoxapina -antidepresivos, tales como citalopram, escitalopram, fluoxetina, paroxetina y sertralina -desferroxamina -dofetilida -maprotilina -antidepresivos tricclicos, tales como amitriptilina, clomipramina, imipramina, nortriptilina y otros Esta medicina tambin puede Counselling psychologist con los siguientes medicamentos: -litio -analgsicos -fenitona -propranolol -warfarina Puede ser que esta lista no menciona todas las posibles interacciones. Informe a su profesional de KB Home	[place] de [employer] productos a base de hierbas, medicamentos de [place] o suplementos nutritivos que est tomando. Si usted fuma, consume bebidas  alcohlicas o si utiliza drogas ilegales, indqueselo tambin a su profesional de KB Home	[place]. Algunas sustancias pueden interactuar con su medicamento. A qu debo estar atento al usar Coca-Cola? Visite a su mdico o a su profesional de la salud para chequear su evolucin peridicamente. Puede experimentar mareos o somnolencia. No conduzca ni utilice maquinaria ni haga nada que Associate Professor en estado de alerta hasta que sepa cmo le afecta este medicamento. No se siente ni se ponga de pie con rapidez, especialmente si es un paciente de edad avanzada. Esto reduce el riesgo de mareos o [employer]. El alcohol puede interferir con el efecto de este medicamento. Evite consumir bebidas alcohlicas. Este medicamento puede reducir la respuesta de su cuerpo al fro o al calor. Vstase abrigado cuando hace frio y mantngase hidratado cuando hace calor. Si es posible, evite temperaturas extremas como las de saunas, piletas de agua caliente o baos o duchas muy fras o muy calientes o actividades que pueden causar deshidratacin como ejercicio vigoroso. Este medicamento puede aumentar la sensibilidad al sol. Mantngase fuera de Administrator. Si no lo puede evitar, utilice ropa protectora y crema de Photographer. No utilice lmparas solares, camas solares ni cabinas solares. Se le podr secar la boca. Masticar chicle sin azcar, chupar caramelos duros y tomar agua en abundancia le ayudar a mantener la boca hmeda. Si el problema no desaparece o es severo, consulte a su mdico. Qu efectos secundarios puedo tener al [employer] este medicamento? Efectos secundarios que debe informar a su mdico o a Barrister's clerk de la salud tan pronto como sea posible: -visin borrosa -agrandamiento de las tetillas (hombres) o los pechos (mujeres) -produccin de Midwife en mujeres que no estn amamantando -dolor en el pecho o pulso cardiaco rpido o irregular -confusin, inquietud -orina de color amarillo oscuro  o marrn -difcultad al respirar o para tragar -mareos o desmayos -babeo, temblor, dificultad con los movimientos (caminar [employer]) o rigidez -fiebre, escalofros, dolor  de garganta -movimientos involuntarios o incontrolables de ojos, boca, cabeza, brazos y piernas -convulsiones -dolor estomacal -cansancio o debilidad inusual -sangrado o magulladuras inusuales -color amarillento de los ojos o de la piel Efectos secundarios que, por lo general, no requieren Geophysical data processor (debe informarlos a su mdico o a Barrister's clerk de la salud si persisten o si son molestos): -dificultad para Garment/textile technologist -dificultad para conciliar el sueo -dolor de cabeza -disfuncin sexual -erupcin cutnea o picazn Puede ser que esta lista no menciona todos los posibles efectos secundarios. Comunquese a su mdico por asesoramiento mdico [employer]. Usted puede informar los efectos secundarios a la FDA por telfono al 1-800-FDA-1088. Dnde debo guardar mi medicina? Mantngala fuera del alcance de los nios. Gurdela a temperatura ambiente de entre 15 y 48 grados C (66 y 63 grados F). Protjala de la luz. Deseche todo el medicamento que no haya utilizado, despus de la fecha de vencimiento. ATENCIN: Este folleto es un resumen. Puede ser que no cubra toda la posible informacin. Si usted tiene preguntas acerca de esta medicina, consulte con su mdico, su farmacutico o su profesional de Technical sales engineer.    2016, Elsevier/Gold Standard. (2014-03-14 00:00:00) Leucovorin injection Qu es este medicamento? La LEUCOVORINA se [place] para prevenir o tratar [employer] nocivos de ciertos medicamentos. Este medicamento tambin sirve para tratar la anemia provocada por una nivel bajo de cido flico en el cuerpo. Tambin se puede administrar con 5-fluorouracilo (5-FU), para tratar el cncer de colon. Este medicamento puede ser utilizado para otros usos; si tiene alguna pregunta consulte con su  proveedor de atencin mdica o con su farmacutico. Qu le debo informar a mi profesional de la salud antes de tomar este medicamento? Necesita saber si usted presenta alguno de los siguientes problemas o situaciones: -anemia debido a bajos niveles de vitamina B-12 en la sangre -una reaccin alrgica o inusual a la leucovorina, cido flico, a otros medicamentos, alimentos, colorantes o conservantes -si est embarazada o buscando quedar embarazada -si est amamantando a un beb Cmo debo utilizar este medicamento? Este medicamento se administra mediante inyeccin por va intramuscular o intravenosa. Lo administra un profesional de Technical sales engineer en un hospital o en un entorno clnico. Hable con su pediatra para informarse acerca del uso de este medicamento en nios. Puede requerir atencin especial. Sobredosis: Pngase en contacto inmediatamente con un centro toxicolgico o una sala de urgencia si usted cree que haya tomado demasiado medicamento. ATENCIN: [employer] es solo para usted. No comparta este medicamento con nadie. Qu sucede si me olvido de una dosis? No se aplica en este caso. Qu puede interactuar con este medicamento? -capecitabina -fluorouracilo -fenobarbital -fenitona -primidona -trimetoprima- sulfametoxasol Puede ser que esta lista no menciona todas las posibles interacciones. Informe a su profesional de KB Home	[place] de [employer] productos a base de hierbas, medicamentos de [place] o suplementos nutritivos que est tomando. Si usted fuma, consume bebidas alcohlicas o si utiliza drogas ilegales, indqueselo tambin a su profesional de KB Home	[place]. Algunas sustancias pueden interactuar con su medicamento. A qu debo estar atento al usar Coca-Cola? Se supervisar su estado de salud atentamente mientras reciba este medicamento. Este medicamento puede aumentar los efectos secundarios de 5-fluorouracilo, 5-FU. Si tiene diarrea o [employer] boca que no mejoran o que  [place], consulte a su mdico o a su profesional de KB Home	[place]. Qu efectos secundarios puedo tener al [employer] este medicamento? Efectos secundarios que debe informar a su mdico o a Barrister's clerk de Technical sales engineer  tan pronto como sea posible: -reacciones alrgicas como erupcin cutnea, picazn o urticarias, hinchazn de la cara, labios o lengua -problemas respiratorios -fiebre, infeccin -llagas en la boca -sangrado, magulladuras inusuales -cansancio o debilidad inusual Efectos secundarios que, por lo general, no requieren atencin mdica (debe informarlos a su mdico o a su profesional de la salud si persisten o si son molestos): -estreimiento o diarrea -prdida del apetito -nuseas, vmito Puede ser que esta lista no menciona todos los posibles efectos secundarios. Comunquese a su mdico por asesoramiento mdico [employer]. Usted puede informar los efectos secundarios a la FDA por telfono al 1-800-FDA-1088. Dnde debo guardar mi medicina? Este medicamento se administra en hospitales o clnicas y no necesitar guardarlo en su domicilio. ATENCIN: Este folleto es un resumen. Puede ser que no cubra toda la posible informacin. Si usted tiene preguntas acerca de esta medicina, consulte con su mdico, su farmacutico o su profesional de Technical sales engineer.    2016, Elsevier/Gold Standard. (2014-03-14 00:00:00) Oxaliplatin Injection Qu es este medicamento? El OXALIPLATINO es un agente quimioteraputico. Este medicamento acta sobre las clulas que se dividen rpidamente, como las clulas cancerosas, y finalmente provoca la muerte de estas clulas. Se utiliza en el tratamiento del cncer de colon y recto y 37 tipos de cncer. Este medicamento puede ser utilizado para otros usos; si tiene alguna pregunta consulte con su proveedor de atencin mdica o con su farmacutico. Qu le debo informar a mi profesional de la salud antes de tomar este medicamento? Necesita saber si usted  presenta alguno de los siguientes problemas o situaciones: -enfermedad renal -una reaccin alrgica o inusual al oxaliplatino, a otros agentes quimioteraputicos, a otros medicamentos, alimentos, colorantes o conservantes -si est embarazada o buscando quedar embarazada -si est amamantando a un beb Cmo debo utilizar este medicamento? Este medicamento se administra mediante infusin por va intravenosa. Lo administra un profesional de la salud calificado en un hospital o en un entorno clnico. Hable con su pediatra para informarse acerca del uso de este medicamento en nios. Puede requerir atencin especial. Sobredosis: Pngase en contacto inmediatamente con un centro toxicolgico o una sala de urgencia si usted cree que haya tomado demasiado medicamento. ATENCIN: [employer] es solo para usted. No comparta este medicamento con nadie. Qu sucede si me olvido de una dosis? Es importante no olvidar ninguna dosis. Informe a su mdico o a su profesional de la salud si no puede asistir a Photographer. Qu puede interactuar con este medicamento? -medicamentos para incrementar los conteos sanguneos, tales como filgrastim, pegfilgrastim, sargramostim -probenecid -ciertos antibiticos, tales como amicacina, gentamicina, neomicina, polimixina B, estreptomicina, tobramicina -zalcitabina Consulte a su mdico o a su profesional de la salud antes de tomar cualquiera de los siguientes medicamentos: -acetaminofeno -aspirina -ibuprofeno -quetoprofeno -naproxeno Puede ser que esta lista no menciona todas las posibles interacciones. Informe a su profesional de KB Home	[place] de [employer] productos a base de hierbas, medicamentos de [place] o suplementos nutritivos que est tomando. Si usted fuma, consume bebidas alcohlicas o si utiliza drogas ilegales, indqueselo tambin a su profesional de KB Home	[place]. Algunas sustancias pueden interactuar con su medicamento. A qu debo estar atento al usar [employer]? Se supervisar su condicin atentamente mientras reciba este medicamento. Tendr que hacerse anlisis de sangre peridicos mientras reciba este medicamento. Este medicamento puede aumentar su sensibilidad al fro. No tomar bebidas fras o usar hielo. Cubra la piel expuesta antes de estar en contacto con temperaturas fras u objetos fros. Mientras se encuentra afuera cuando  haga fro use ropa abrigada y [place] su boca y su nariz para calentar el aire que entra en sus pulmones. Informe a su mdico si experimenta sensibilidad al fro. Este medicamento puede hacerle sentir un Nurse, mental health. Esto es normal ya que la quimioterapia afecta tanto a las clulas sanas como a las clulas cancerosas. Si presenta alguno de los [employer], infrmelos. Sin embargo, contine con el tratamiento aun si se siente enfermo, a menos que su mdico le indique que lo suspenda. En algunos casos, podr recibir [employer] para ayudarle con los efectos secundarios. Siga las instrucciones para usarlos. Consulte a su mdico o a su profesional de la salud por asesoramiento si tiene fiebre, escalofros, dolor de garganta o cualquier otro sntoma de resfro o gripe. No se trate usted mismo. Este medicamento puede reducir la capacidad del cuerpo para combatir infecciones. Trate de no acercarse a personas que estn enfermas. [employer] puede aumentar el riesgo de magulladuras o sangrado. Consulte a su mdico o a su profesional de la salud si observa sangrados inusuales. Proceda con cuidado al cepillar sus dientes, usar hilo dental o Risk manager palillos para los dientes, ya que puede contraer una infeccin o Therapist, art con mayor facilidad. Si se somete a algn tratamiento dental, informe a su dentista que est [employer]. Evite tomar productos que contienen aspirina, acetaminofeno, ibuprofeno, naproxeno o quetoprofeno a menos que as lo indique su mdico. Estos productos pueden disimular la  fiebre. No se debe quedar embarazada mientras reciba este medicamento. Las mujeres deben informar a su mdico si estn buscando quedar embarazadas o si creen que estn embarazadas. Existe la posibilidad de efectos secundarios graves a un beb sin nacer. Para ms informacin hable con su profesional de la salud o su farmacutico. No debe Economist a un beb mientras reciba este medicamento. Si tiene diarrea, llame a su mdico o a su profesional de [employer]	[place]. No se trate usted mismo. Qu efectos secundarios puedo tener al [employer] este medicamento? Efectos secundarios que debe informar a su mdico o a Barrister's clerk de la salud tan pronto como sea posible: -reacciones alrgicas como erupcin cutnea, picazn o urticarias, hinchazn de la cara, labios o lengua -conteos sanguneos bajos - este medicamento puede reducir la cantidad de glbulos blancos, glbulos rojos y plaquetas. Su riesgo de infeccin y [place]. -signos de infeccin - fiebre o escalofros, tos, dolor de garganta, Social research officer, government o dificultad para orinar -signos de reduccin de plaquetas o sangrado - magulladuras, puntos rojos en la piel, heces de color oscuro o con aspecto alquitranado, sangrado por la nariz -signos de reduccin de glbulos rojos - cansancio o debilidad inusual, desmayos, sensacin de mareo -problemas respiratorios -dolor u opresin en el pecho -tos -diarrea -tensin en la mandbula -llagas en la boca -nuseas, vmito -dolor, hinchazn, enrojecimiento o irritacin en el lugar de la inyeccin -dolor, hormigueo, entumecimiento de manos o pies -problemas de coordinacin, del habla, al caminar -enrojecimiento, formacin de ampollas, descamacin o distensin de la piel, inclusive dentro de la boca -dificultad para orinar o cambios en el volumen de orina Efectos secundarios que, por lo general, no requieren atencin mdica (debe informarlos a su mdico o a su profesional de la salud si persisten o si son  molestos): -cambios en la visin -estreimiento -cada del cabello -prdida del apetito -sabor metlico en la boca o cambios en el sentido del gusto -Administrator Puede ser que esta lista no menciona todos los posibles efectos secundarios. Comunquese a su mdico por asesoramiento  mdico [employer] Usted puede informar los efectos secundarios a la FDA por telfono al 1-800-FDA-1088. Dnde debo guardar mi medicina? Este medicamento se administra en hospitales o clnicas y no necesitar guardarlo en su domicilio. ATENCIN: Este folleto es un resumen. Puede ser que no cubra toda la posible informacin. Si usted tiene preguntas acerca de esta medicina, consulte con su mdico, su farmacutico o su profesional de Technical sales engineer.    2016, Elsevier/Gold Standard. (2014-03-14 00:00:00) Fluorouracil, 5-FU injection Qu es este medicamento? [place], 5-FU es un agente quimioteraputico. Este medicamento reduce el crecimiento de las clulas cancerosas. Se utiliza en el tratamiento de muchos tipos de cncer, incluyendo el cncer de mama, de colon y recto, pancretico y de Paramedic. Este medicamento puede ser utilizado para otros usos; si tiene alguna pregunta consulte con su proveedor de atencin mdica o con su farmacutico. Qu le debo informar a mi profesional de la salud antes de tomar este medicamento? Necesita saber si usted presenta alguno de los siguientes problemas o situaciones: -trastornos sanguneos -deficiencia de la dihidropirimidina deshidrogenasa (DPD) -infeccin (especialmente infecciones virales, como varicela o herpes) -enfermedad renal -enfermedad heptica -desnutrido, malnutricin -radioterapia reciente o continuada -una reaccin alrgica o inusual al fluorouracilo, a otros agentes quimioteraputicos, otros medicamentos, alimentos, colorantes o conservantes -si est embarazada o buscando quedar embarazada -si est amamantando a un beb Cmo debo  utilizar este medicamento? Este medicamento se administra mediante inyeccin o infusin por va intravenosa. Lo administra un profesional de la salud calificado en un hospital o en un entorno clnico. Hable con su pediatra para informarse acerca del uso de este medicamento en nios. Puede requerir atencin especial. Sobredosis: Pngase en contacto inmediatamente con un centro toxicolgico o una sala de urgencia si usted cree que haya tomado demasiado medicamento. ATENCIN: [employer] es solo para usted. No comparta este medicamento con nadie. Qu sucede si me olvido de una dosis? Es importante no olvidar ninguna dosis. Informe a su mdico o a su profesional de la salud si no puede asistir a Photographer. Qu puede interactuar con este medicamento? -alopurinol -cimetidina -dapsona -digoxina -hidroxiurea -leucovorina -levamisol -medicamentos para convulsiones, tales como etotona, fosfenitona, fenitona -medicamentos para incrementar los conteos sanguneos, tales como filgrastim, pegfilgrastim, sargramostim -medicamentos que tratan o previenen cogulos sanguneos, tales como warfarina, enoxaparina y dalteparina -metotrexato -metronidazol -pirimetamina -otros agentes quimioteraputicos, tales como busulfn, cisplatino, estramustina, vinblastina -trimetoprima -trimetrexato -vacunas Consulte a su mdico o a su profesional de la salud antes de tomar cualquiera de los siguientes medicamentos: -acetaminofeno -aspirina -ibuprofeno -quetoprofeno -naproxeno Puede ser que esta lista no menciona todas las posibles interacciones. Informe a su profesional de [employer]	[place] de [employer] productos a base de hierbas, medicamentos de [place] o suplementos nutritivos que est tomando. Si usted fuma, consume bebidas alcohlicas o si utiliza drogas ilegales, indqueselo tambin a su profesional de [employer]	[place]. Algunas sustancias pueden interactuar con su medicamento. A qu debo estar atento al usar [employer]? Visite a su mdico para chequear su evolucin peridicamente. Este medicamento puede hacerle sentir un Nurse, mental health. Esto es normal ya que la quimioterapia afecta tanto a las clulas sanas como a las clulas cancerosas. Si presenta alguno de los [employer], infrmelos. Sin embargo, contine con el tratamiento aun si se siente enfermo, a menos que su mdico le indique que lo suspenda. En algunos casos, podr recibir [employer] para ayudarle con los efectos secundarios. Siga las instrucciones para usarlos. Consulte a su mdico o a su profesional de [employer]	[place]  por asesoramiento si tiene fiebre, escalofros, dolor de garganta o cualquier otro sntoma de resfro o gripe. No se trate usted mismo. Este medicamento puede reducir la capacidad del cuerpo para combatir infecciones. Trate de no acercarse a personas que estn enfermas. [employer] puede aumentar el riesgo de magulladuras o sangrado. Consulte a su mdico o a su profesional de la salud si observa sangrados inusuales. Proceda con cuidado al cepillar sus dientes, usar hilo dental o Risk manager palillos para los dientes, ya que puede contraer una infeccin o Therapist, art con mayor facilidad. Si se somete a algn tratamiento dental, informe a su dentista que est [employer]. Evite tomar productos que contienen aspirina, acetaminofeno, ibuprofeno, naproxeno o quetoprofeno a menos que as lo indique su mdico. Estos productos pueden disimular la fiebre. No se debe quedar embarazada mientras recibe este medicamento. Las mujeres deben informar a su mdico si estn buscando quedar embarazadas o si creen que estn embarazadas. Existe la posibilidad de efectos secundarios graves a un beb sin nacer. Para ms informacin hable con su profesional de la salud o su farmacutico. No debe Economist a un beb mientras est usando este medicamento. Los hombres deben informar a su mdico si quieren tener nios. Este medicamento  puede reducir el conteo de esperma. No trate la diarrea con productos de [employer]. Comunquese con su mdico si tiene diarrea que dura ms de 2 das o si es severa y [place]. Este medicamento puede aumentar la sensibilidad al sol. Mantngase fuera de Administrator. Si no lo puede evitar, utilice ropa protectora y crema de Photographer. No utilice lmparas solares, camas solares ni cabinas solares. Qu efectos secundarios puedo tener al [employer] este medicamento? Efectos secundarios que debe informar a su mdico o a Barrister's clerk de la salud tan pronto como sea posible: -reacciones alrgicas como erupcin cutnea, picazn o urticarias, hinchazn de la cara, labios o lengua -conteos sanguneos bajos - este medicamento puede reducir la cantidad de glbulos blancos, glbulos rojos y plaquetas. Su riesgo de infeccin y [place]. -signos de infeccin - fiebre o escalofros, tos, dolor de garganta, Social research officer, government o dificultad para orinar -signos de reduccin de plaquetas o sangrado - magulladuras, puntos rojos en la piel, heces de color oscuro o con aspecto alquitranado, sangre en la orina -signos de reduccin de glbulos rojos - cansancio o debilidad inusual, desmayos, sensacin de mareo -problemas respiratorios -cambios en la visin -dolor en el pecho -llagas en la boca -nuseas, vmito -dolor, hinchazn, enrojecimiento en el lugar de la inyeccin -hormigueo, dolor, entumecimiento de manos o pies -enrojecimiento, hinchazn o llagas en las manos o pies -dolor estomacal -sangrado inusuales Efectos secundarios que, por lo general, no requieren atencin mdica (debe informarlos a su mdico o a su profesional de la salud si persisten o si son molestos): -cambios en las uas de las manos o pies -diarrea -picazn o sequedad de la piel -cada del cabello -dolor de cabeza -prdida del apetito -sensibilidad de los ojos a la luz -Higher education careers adviser -ojos inusualmente llorosos Puede ser  que esta lista no menciona todos los posibles efectos secundarios. Comunquese a su mdico por asesoramiento mdico [employer]. Usted puede informar los efectos secundarios a la FDA por telfono al 1-800-FDA-1088. Dnde debo guardar mi medicina? Este medicamento se administra en hospitales o clnicas y no necesitar guardarlo en su domicilio. ATENCIN: Este folleto es un resumen. Puede ser que no cubra toda la posible informacin. Si usted tiene preguntas acerca de esta medicina, consulte  con su mdico, su farmacutico o su profesional de [employer]	[place].    2016, Elsevier/Gold Standard. (2014-03-14 00:00:00)

## 2015-08-08 NOTE — Progress Notes (Addendum)
[facility]-  Cancer Center  Clinic day:  08/08/2015  Chief Complaint: Shawnee Higham is a 35 y.o. male with gastric wall thickening and peritoneal carcinomatosis who is seen for assessment after interval EGD and gastric biopsy.  HPI:  The patient was last seen 08/01/2015.  At that time, he was seen for initial consultation.  He described abdominal pain aggravated by eating.  He had lost 30 pounds in the past month.  He was scheduled for EGD.  EGD on 08/02/2015 revealed a large, ulcerated, non-circumferential mass with oozing and stigmata of recent bleeding in the entire examined stomach.  The mass involved the fundus down to the antrum.  Biopsies revealed poorly differentiated adenocarcinoma.  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  Symptomatically, he notes some nausea.  Abdominal pain has improved since his procedure.  He is eating only once a day.   Past Medical History  Diagnosis Date  . GERD (gastroesophageal reflux disease)     Past Surgical History  Procedure Laterality Date  . No past surgeries    . Esophagogastroduodenoscopy (egd) with propofol N/A 08/02/2015    Procedure: ESOPHAGOGASTRODUODENOSCOPY (EGD) WITH PROPOFOL;  Surgeon: Midge Minium, MD;  Location: [facility];  Service: Endoscopy;  Laterality: N/A;    History reviewed. No pertinent family history.  Social History:  reports that he has never smoked. He does not have any smokeless tobacco history on file. He reports that he does not drink alcohol or use illicit drugs.  The patient is from [place].  He speaks [place].  He moves to the [place] in 2000.  He lives in [place] with his father, mother, brother and sister.  He is a Designer, fashion/clothing.  The patient is accompanied by his mother, father, sister, brother, and 2 nieces today.  Interpreter services via teleconferencing were obtained.  Allergies: No Known Allergies  Current Medications: Current Outpatient  Prescriptions  Medication Sig Dispense Refill  . amoxicillin (AMOXIL) 500 MG tablet Take 2 tablets (1000 mg) by mouth twice a day for 14 days 56 tablet 0  . clarithromycin (BIAXIN) 500 MG tablet Take 1 tablet (500 mg total) by mouth 2 (two) times daily. 28 tablet 0  . omeprazole (PRILOSEC) 20 MG capsule Take 1 capsule (20 mg total) by mouth 2 (two) times daily. 30 capsule 1  . sucralfate (CARAFATE) 1 g tablet Take 1 tablet (1 g total) by mouth 4 (four) times daily. 120 tablet 1   No current facility-administered medications for this visit.    Review of Systems:  GENERAL:  Fatigue.  No fevers or sweats.  Weight loss of 3 pounds since last visit. PERFORMANCE STATUS (ECOG):  1 HEENT:  No visual changes, runny nose, sore throat, mouth sores or tenderness. Lungs: No shortness of breath or cough.  No hemoptysis. Cardiac:  No chest pain, palpitations, orthopnea, or PND. GI:   Abdominal pain, improved since procedure.  Nausea.  No diarrhea, constipation, melena or hematochezia. GU:  No urgency, frequency, dysuria, or hematuria. Musculoskeletal:  Chest and  back pain.  No joint pain.  No muscle tenderness. Extremities:  No pain or swelling. Skin:  No rashes or skin changes. Neuro:  Headache.  No numbness or weakness, balance or coordination issues. Endocrine:  No diabetes, thyroid issues, hot flashes or night sweats. Psych:  No mood changes, depression or anxiety. Pain:  No focal pain. Review of systems:  All other systems reviewed and found to be negative.  Physical Exam: Weight 181 lb 5.3  oz (82.25 kg). GENERAL:  Well developed, well nourished, gentleman sitting comfortably in the exam room in no acute distress. MENTAL STATUS:  Alert and oriented to person, place and time. HEAD:  Wearing a baseball cap.  Black hair.  Mustache.  Normocephalic, atraumatic, face symmetric, no Cushingoid features. EYES:  Brown eyes.  No conjunctivitis or scleral icterus. NEUROLOGICAL: Appropriate. PSYCH:   Appropriate.    Appointment on 08/08/2015  Component Date Value Ref Range Status  . Magnesium 08/08/2015 2.0  1.7 - 2.4 mg/dL Final  . WBC 08/08/2015 8.7  3.8 - 10.6 K/uL Final  . RBC 08/08/2015 5.49  4.40 - 5.90 MIL/uL Final  . Hemoglobin 08/08/2015 15.1  13.0 - 18.0 g/dL Final  . HCT 08/08/2015 45.1  40.0 - 52.0 % Final  . MCV 08/08/2015 82.1  80.0 - 100.0 fL Final  . MCH 08/08/2015 27.6  26.0 - 34.0 pg Final  . MCHC 08/08/2015 33.6  32.0 - 36.0 g/dL Final  . RDW 08/08/2015 12.2  11.5 - 14.5 % Final  . Platelets 08/08/2015 286  150 - 440 K/uL Final  . Neutrophils Relative % 08/08/2015 77%   Final  . Neutro Abs 08/08/2015 6.8* 1.4 - 6.5 K/uL Final  . Lymphocytes Relative 08/08/2015 15%   Final  . Lymphs Abs 08/08/2015 1.3  1.0 - 3.6 K/uL Final  . Monocytes Relative 08/08/2015 6%   Final  . Monocytes Absolute 08/08/2015 0.5  0.2 - 1.0 K/uL Final  . Eosinophils Relative 08/08/2015 1%   Final  . Eosinophils Absolute 08/08/2015 0.1  0 - 0.7 K/uL Final  . Basophils Relative 08/08/2015 1%   Final  . Basophils Absolute 08/08/2015 0.1  0 - 0.1 K/uL Final  . Sodium 08/08/2015 139  135 - 145 mmol/L Final  . Potassium 08/08/2015 3.7  3.5 - 5.1 mmol/L Final  . Chloride 08/08/2015 102  101 - 111 mmol/L Final  . CO2 08/08/2015 28  22 - 32 mmol/L Final  . Glucose, Bld 08/08/2015 88  65 - 99 mg/dL Final  . BUN 08/08/2015 13  6 - 20 mg/dL Final  . Creatinine, Ser 08/08/2015 0.94  0.61 - 1.24 mg/dL Final  . Calcium 08/08/2015 9.0  8.9 - 10.3 mg/dL Final  . Total Protein 08/08/2015 8.0  6.5 - 8.1 g/dL Final  . Albumin 08/08/2015 4.2  3.5 - 5.0 g/dL Final  . AST 08/08/2015 20  15 - 41 U/L Final  . ALT 08/08/2015 17  17 - 63 U/L Final  . Alkaline Phosphatase 08/08/2015 83  38 - 126 U/L Final  . Total Bilirubin 08/08/2015 0.6  0.3 - 1.2 mg/dL Final  . GFR calc non Af Amer 08/08/2015 >60  >60 mL/min Final  . GFR calc Af Amer 08/08/2015 >60  >60 mL/min Final   Comment: (NOTE) The eGFR has been  calculated using the CKD EPI equation. This calculation has not been validated in all clinical situations. eGFR's persistently <60 mL/min signify possible Chronic Kidney Disease.   . Anion gap 08/08/2015 9  5 - 15 Final    Assessment:  Dent Plantz is a 35 y.o. male with metastatic gastric cancer.  He presented with a 4-6 week history of progressive epigastric pain.  EGD on 08/02/2015 revealed a large, ulcerated, non-circumferential mass with oozing and stigmata of recent bleeding in the entire examined stomach.  The mass involved the fundus down to the antrum.  Biopsies revealed poorly differentiated adenocarcinoma.  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  Chest, abdomen and pelvic CT scan on 07/29/2015 revealed a 4 cm thickened and irregular gastric wall compatible with an infiltrative malignancy. There was extensive involvement of the upper mesentery and omentum compatible with peritoneal carcinomatosis. There was a small amount of ascites.   Symptomatically, he has epigastric pain which has improved since his procedure.  He has lost 30 pounds in the past month.   Plan: 1.  Discuss results of gastric biopsy.  Discuss imaging and diagnosis of metastatic gastric cancer.  Discuss palliative treatment with chemotherapy. Discuss FOLFOX chemotherapy +/- Herceptin.  Side effects and administration of chemotherapy were reviewed.  Discuss myelosuppression, nausea, mouth sores, diarrhea, and neuropathy (cold and sensory).  Discuss 5FU infusion pump for 46 hours.  Discuss chemotherapy class.  Discuss port-a-cath placement.  Discuss addition of Herceptin based on testing of tumor (Her2/neu).  Discuss potential enrollment on [facility] clinical trail (FOLFOX + [id number] (andecaliximab)).  Discuss genetic testing (My Risk).  2.  Discuss Helicobacter pylori.  Discuss antibiotic treatment with proton pump inhibitor and antibiotics.  Discuss substitution of omeprazole for Prevacid. 3.   Preauth FOLFOX chemotherapy. 4,  Port-a-cath placement. 5.  Rx:  omeprazole 20 mg po BID, clarithromycin 500 mg BID, and amoxicillin 1 gm BID x 14 days. 6.  My Risk genetic testing. 7.  Schedule chemotherapy class. 8.  Request for Her2/neu and MSI testing. 9.  Present at tumor board. 10.  Follow-up head CT performed today. 11.  RTC for MD assess, labs (CBC with diff, CMP, Mg), and cycle #1 FOLFOX chemotherapy.  Addendum:  H pylori treatment with combination pill (Pylera) is cost prohibitive.  Medication changed to amoxicillin, clarithromycin, and omeprazole.  Spoke with Dietitian at [place].  She recommends gastric panel [facility] rather than My Risk genetic testing as it is more comprehensive [phone]).   Lequita Asal, MD  08/08/2015, 3:23 PM

## 2015-08-08 NOTE — Patient Instructions (Signed)
Oxaliplatin Injection What is this medicine? OXALIPLATIN (ox AL i PLA tin) is a chemotherapy drug. It targets fast dividing cells, like cancer cells, and causes these cells to die. This medicine is used to treat cancers of the colon and rectum, and many other cancers. This medicine may be used for other purposes; ask your health care provider or pharmacist if you have questions. What should I tell my health care provider before I take this medicine? They need to know if you have any of these conditions: -kidney disease -an unusual or allergic reaction to oxaliplatin, other chemotherapy, other medicines, foods, dyes, or preservatives -pregnant or trying to get pregnant -breast-feeding How should I use this medicine? This drug is given as an infusion into a vein. It is administered in a hospital or clinic by a specially trained health care professional. Talk to your pediatrician regarding the use of this medicine in children. Special care may be needed. Overdosage: If you think you have taken too much of this medicine contact a poison control center or emergency room at once. NOTE: This medicine is only for you. Do not share this medicine with others. What if I miss a dose? It is important not to miss a dose. Call your doctor or health care professional if you are unable to keep an appointment. What may interact with this medicine? -medicines to increase blood counts like filgrastim, pegfilgrastim, sargramostim -probenecid -some antibiotics like amikacin, gentamicin, neomycin, polymyxin B, streptomycin, tobramycin -zalcitabine Talk to your doctor or health care professional before taking any of these medicines: -acetaminophen -aspirin -ibuprofen -ketoprofen -naproxen This list may not describe all possible interactions. Give your health care provider a list of all the medicines, herbs, non-prescription drugs, or dietary supplements you use. Also tell them if you smoke, drink alcohol, or use  illegal drugs. Some items may interact with your medicine. What should I watch for while using this medicine? Your condition will be monitored carefully while you are receiving this medicine. You will need important blood work done while you are taking this medicine. This medicine can make you more sensitive to cold. Do not drink cold drinks or use ice. Cover exposed skin before coming in contact with cold temperatures or cold objects. When out in cold weather wear warm clothing and cover your mouth and nose to warm the air that goes into your lungs. Tell your doctor if you get sensitive to the cold. This drug may make you feel generally unwell. This is not uncommon, as chemotherapy can affect healthy cells as well as cancer cells. Report any side effects. Continue your course of treatment even though you feel ill unless your doctor tells you to stop. In some cases, you may be given additional medicines to help with side effects. Follow all directions for their use. Call your doctor or health care professional for advice if you get a fever, chills or sore throat, or other symptoms of a cold or flu. Do not treat yourself. This drug decreases your body's ability to fight infections. Try to avoid being around people who are sick. This medicine may increase your risk to bruise or bleed. Call your doctor or health care professional if you notice any unusual bleeding. Be careful brushing and flossing your teeth or using a toothpick because you may get an infection or bleed more easily. If you have any dental work done, tell your dentist you are receiving this medicine. Avoid taking products that contain aspirin, acetaminophen, ibuprofen, naproxen, or ketoprofen unless instructed  by your doctor. These medicines may hide a fever. Do not become pregnant while taking this medicine. Women should inform their doctor if they wish to become pregnant or think they might be pregnant. There is a potential for serious side  effects to an unborn child. Talk to your health care professional or pharmacist for more information. Do not breast-feed an infant while taking this medicine. Call your doctor or health care professional if you get diarrhea. Do not treat yourself. What side effects may I notice from receiving this medicine? Side effects that you should report to your doctor or health care professional as soon as possible: -allergic reactions like skin rash, itching or hives, swelling of the face, lips, or tongue -low blood counts - This drug may decrease the number of white blood cells, red blood cells and platelets. You may be at increased risk for infections and bleeding. -signs of infection - fever or chills, cough, sore throat, pain or difficulty passing urine -signs of decreased platelets or bleeding - bruising, pinpoint red spots on the skin, black, tarry stools, nosebleeds -signs of decreased red blood cells - unusually weak or tired, fainting spells, lightheadedness -breathing problems -chest pain, pressure -cough -diarrhea -jaw tightness -mouth sores -nausea and vomiting -pain, swelling, redness or irritation at the injection site -pain, tingling, numbness in the hands or feet -problems with balance, talking, walking -redness, blistering, peeling or loosening of the skin, including inside the mouth -trouble passing urine or change in the amount of urine Side effects that usually do not require medical attention (report to your doctor or health care professional if they continue or are bothersome): -changes in vision -constipation -hair loss -loss of appetite -metallic taste in the mouth or changes in taste -stomach pain This list may not describe all possible side effects. Call your doctor for medical advice about side effects. You may report side effects to FDA at 1-800-FDA-1088. Where should I keep my medicine? This drug is given in a hospital or clinic and will not be stored at home. NOTE:  This sheet is a summary. It may not cover all possible information. If you have questions about this medicine, talk to your doctor, pharmacist, or health care provider.    2016, Elsevier/Gold Standard. (2007-08-17 17:22:47) Oxaliplatin Injection Qu es este medicamento? El OXALIPLATINO es un agente quimioteraputico. Este medicamento acta sobre las clulas que se dividen rpidamente, como las clulas cancerosas, y finalmente provoca la muerte de estas clulas. Se utiliza en el tratamiento del cncer de colon y recto y otros tipos de cncer. Este medicamento puede ser utilizado para otros usos; si tiene alguna pregunta consulte con su proveedor de atencin mdica o con su farmacutico. Qu le debo informar a mi profesional de la salud antes de tomar este medicamento? Necesita saber si usted presenta alguno de los siguientes problemas o situaciones: -enfermedad renal -una reaccin alrgica o inusual al oxaliplatino, a otros agentes quimioteraputicos, a otros medicamentos, alimentos, colorantes o conservantes -si est embarazada o buscando quedar embarazada -si est amamantando a un beb Cmo debo utilizar este medicamento? Este medicamento se administra mediante infusin por va intravenosa. Lo administra un profesional de la salud calificado en un hospital o en un entorno clnico. Hable con su pediatra para informarse acerca del uso de este medicamento en nios. Puede requerir atencin especial. Sobredosis: Pngase en contacto inmediatamente con un centro toxicolgico o una sala de urgencia si usted cree que haya tomado demasiado medicamento. ATENCIN: [employer] es solo para usted. No comparta  este medicamento con nadie. Qu sucede si me olvido de una dosis? Es importante no olvidar ninguna dosis. Informe a su mdico o a su profesional de la salud si no puede asistir a Photographer. Qu puede interactuar con este medicamento? -medicamentos para incrementar los conteos sanguneos, tales  como filgrastim, pegfilgrastim, sargramostim -probenecid -ciertos antibiticos, tales como amicacina, gentamicina, neomicina, polimixina B, estreptomicina, tobramicina -zalcitabina Consulte a su mdico o a su profesional de la salud antes de tomar cualquiera de los siguientes medicamentos: -acetaminofeno -aspirina -ibuprofeno -quetoprofeno -naproxeno Puede ser que esta lista no menciona todas las posibles interacciones. Informe a su profesional de [employer]	[place] de [employer] productos a base de hierbas, medicamentos de [place] o suplementos nutritivos que est tomando. Si usted fuma, consume bebidas alcohlicas o si utiliza drogas ilegales, indqueselo tambin a su profesional de [employer]	[place]. Algunas sustancias pueden interactuar con su medicamento. A qu debo estar atento al usar [employer]? Se supervisar su condicin atentamente mientras reciba este medicamento. Tendr que hacerse anlisis de sangre peridicos mientras reciba este medicamento. Este medicamento puede aumentar su sensibilidad al fro. No tomar bebidas fras o usar hielo. Cubra la piel expuesta antes de estar en contacto con temperaturas fras u objetos fros. Mientras se encuentra afuera cuando haga fro use ropa [place] su boca y su nariz para calentar el aire que entra en sus pulmones. Informe a su mdico si experimenta sensibilidad al fro. Este medicamento puede hacerle sentir un Nurse, mental health. Esto es normal ya que la quimioterapia afecta tanto a las clulas sanas como a las clulas cancerosas. Si presenta alguno de los [employer], infrmelos. Sin embargo, contine con el tratamiento aun si se siente enfermo, a menos que su mdico le indique que lo suspenda. En algunos casos, podr recibir [employer] para ayudarle con los efectos secundarios. Siga las instrucciones para usarlos. Consulte a su mdico o a su profesional de la salud por asesoramiento si tiene fiebre, escalofros, dolor de  garganta o cualquier otro sntoma de resfro o gripe. No se trate usted mismo. Este medicamento puede reducir la capacidad del cuerpo para combatir infecciones. Trate de no acercarse a personas que estn enfermas. [employer] puede aumentar el riesgo de magulladuras o sangrado. Consulte a su mdico o a su profesional de la salud si observa sangrados inusuales. Proceda con cuidado al cepillar sus dientes, usar hilo dental o Risk manager palillos para los dientes, ya que puede contraer una infeccin o Therapist, art con mayor facilidad. Si se somete a algn tratamiento dental, informe a su dentista que est [employer]. Evite tomar productos que contienen aspirina, acetaminofeno, ibuprofeno, naproxeno o quetoprofeno a menos que as lo indique su mdico. Estos productos pueden disimular la fiebre. No se debe quedar embarazada mientras reciba este medicamento. Las mujeres deben informar a su mdico si estn buscando quedar embarazadas o si creen que estn embarazadas. Existe la posibilidad de efectos secundarios graves a un beb sin nacer. Para ms informacin hable con su profesional de la salud o su farmacutico. No debe Economist a un beb mientras reciba este medicamento. Si tiene diarrea, llame a su mdico o a su profesional de [employer]	[place]. No se trate usted mismo. Qu efectos secundarios puedo tener al [employer] este medicamento? Efectos secundarios que debe informar a su mdico o a Barrister's clerk de la salud tan pronto como sea posible: -reacciones alrgicas como erupcin cutnea, picazn o urticarias, hinchazn de la cara, labios o lengua -conteos sanguneos bajos - este medicamento puede reducir  la cantidad de glbulos blancos, glbulos rojos y plaquetas. Su riesgo de infeccin y [place]. -signos de infeccin - fiebre o escalofros, tos, dolor de garganta, Social research officer, government o dificultad para orinar -signos de reduccin de plaquetas o sangrado - magulladuras, puntos rojos en la piel, heces de  color oscuro o con aspecto alquitranado, sangrado por la nariz -signos de reduccin de glbulos rojos - cansancio o debilidad inusual, desmayos, sensacin de mareo -problemas respiratorios -dolor u opresin en el pecho -tos -diarrea -tensin en la mandbula -llagas en la boca -nuseas, vmito -dolor, hinchazn, enrojecimiento o irritacin en el lugar de la inyeccin -dolor, hormigueo, entumecimiento de manos o pies -problemas de coordinacin, del habla, al caminar -enrojecimiento, formacin de ampollas, descamacin o distensin de la piel, inclusive dentro de la boca -dificultad para orinar o cambios en el volumen de orina Efectos secundarios que, por lo general, no requieren atencin mdica (debe informarlos a su mdico o a su profesional de la salud si persisten o si son molestos): -cambios en la visin -estreimiento -cada del cabello -prdida del apetito -sabor metlico en la boca o cambios en el sentido del gusto -Administrator Puede ser que esta lista no menciona todos los posibles efectos secundarios. Comunquese a su mdico por asesoramiento mdico [employer] Usted puede informar los efectos secundarios a la FDA por telfono al 1-800-FDA-1088. Dnde debo guardar mi medicina? Este medicamento se administra en hospitales o clnicas y no necesitar guardarlo en su domicilio. ATENCIN: Este folleto es un resumen. Puede ser que no cubra toda la posible informacin. Si usted tiene preguntas acerca de esta medicina, consulte con su mdico, su farmacutico o su profesional de Technical sales engineer.    2016, Elsevier/Gold Standard. (2014-03-14 00:00:00) Fluorouracil, 5-FU injection What is this medicine? FLUOROURACIL, 5-FU (flure oh YOOR a sil) is a chemotherapy drug. It slows the growth of cancer cells. This medicine is used to treat many types of cancer like breast cancer, colon or rectal cancer, pancreatic cancer, and stomach cancer. This medicine may be used for other  purposes; ask your health care provider or pharmacist if you have questions. What should I tell my health care provider before I take this medicine? They need to know if you have any of these conditions: -blood disorders -dihydropyrimidine dehydrogenase (DPD) deficiency -infection (especially a virus infection such as chickenpox, cold sores, or herpes) -kidney disease -liver disease -malnourished, poor nutrition -recent or ongoing radiation therapy -an unusual or allergic reaction to fluorouracil, other chemotherapy, other medicines, foods, dyes, or preservatives -pregnant or trying to get pregnant -breast-feeding How should I use this medicine? This drug is given as an infusion or injection into a vein. It is administered in a hospital or clinic by a specially trained health care professional. Talk to your pediatrician regarding the use of this medicine in children. Special care may be needed. Overdosage: If you think you have taken too much of this medicine contact a poison control center or emergency room at once. NOTE: This medicine is only for you. Do not share this medicine with others. What if I miss a dose? It is important not to miss your dose. Call your doctor or health care professional if you are unable to keep an appointment. What may interact with this medicine? -allopurinol -cimetidine -dapsone -digoxin -hydroxyurea -leucovorin -levamisole -medicines for seizures like ethotoin, fosphenytoin, phenytoin -medicines to increase blood counts like filgrastim, pegfilgrastim, sargramostim -medicines that treat or prevent blood clots like warfarin, enoxaparin, and dalteparin -methotrexate -metronidazole -pyrimethamine -  some other chemotherapy drugs like busulfan, cisplatin, estramustine, vinblastine -trimethoprim -trimetrexate -vaccines Talk to your doctor or health care professional before taking any of these  medicines: -acetaminophen -aspirin -ibuprofen -ketoprofen -naproxen This list may not describe all possible interactions. Give your health care provider a list of all the medicines, herbs, non-prescription drugs, or dietary supplements you use. Also tell them if you smoke, drink alcohol, or use illegal drugs. Some items may interact with your medicine. What should I watch for while using this medicine? Visit your doctor for checks on your progress. This drug may make you feel generally unwell. This is not uncommon, as chemotherapy can affect healthy cells as well as cancer cells. Report any side effects. Continue your course of treatment even though you feel ill unless your doctor tells you to stop. In some cases, you may be given additional medicines to help with side effects. Follow all directions for their use. Call your doctor or health care professional for advice if you get a fever, chills or sore throat, or other symptoms of a cold or flu. Do not treat yourself. This drug decreases your body's ability to fight infections. Try to avoid being around people who are sick. This medicine may increase your risk to bruise or bleed. Call your doctor or health care professional if you notice any unusual bleeding. Be careful brushing and flossing your teeth or using a toothpick because you may get an infection or bleed more easily. If you have any dental work done, tell your dentist you are receiving this medicine. Avoid taking products that contain aspirin, acetaminophen, ibuprofen, naproxen, or ketoprofen unless instructed by your doctor. These medicines may hide a fever. Do not become pregnant while taking this medicine. Women should inform their doctor if they wish to become pregnant or think they might be pregnant. There is a potential for serious side effects to an unborn child. Talk to your health care professional or pharmacist for more information. Do not breast-feed an infant while taking this  medicine. Men should inform their doctor if they wish to father a child. This medicine may lower sperm counts. Do not treat diarrhea with over the counter products. Contact your doctor if you have diarrhea that lasts more than 2 days or if it is severe and watery. This medicine can make you more sensitive to the sun. Keep out of the sun. If you cannot avoid being in the sun, wear protective clothing and use sunscreen. Do not use sun lamps or tanning beds/booths. What side effects may I notice from receiving this medicine? Side effects that you should report to your doctor or health care professional as soon as possible: -allergic reactions like skin rash, itching or hives, swelling of the face, lips, or tongue -low blood counts - this medicine may decrease the number of white blood cells, red blood cells and platelets. You may be at increased risk for infections and bleeding. -signs of infection - fever or chills, cough, sore throat, pain or difficulty passing urine -signs of decreased platelets or bleeding - bruising, pinpoint red spots on the skin, black, tarry stools, blood in the urine -signs of decreased red blood cells - unusually weak or tired, fainting spells, lightheadedness -breathing problems -changes in vision -chest pain -mouth sores -nausea and vomiting -pain, swelling, redness at site where injected -pain, tingling, numbness in the hands or feet -redness, swelling, or sores on hands or feet -stomach pain -unusual bleeding Side effects that usually do not require medical attention (report  to your doctor or health care professional if they continue or are bothersome): -changes in finger or toe nails -diarrhea -dry or itchy skin -hair loss -headache -loss of appetite -sensitivity of eyes to the light -stomach upset -unusually teary eyes This list may not describe all possible side effects. Call your doctor for medical advice about side effects. You may report side effects  to FDA at 1-800-FDA-1088. Where should I keep my medicine? This drug is given in a hospital or clinic and will not be stored at home. NOTE: This sheet is a summary. It may not cover all possible information. If you have questions about this medicine, talk to your doctor, pharmacist, or health care provider.    2016, Elsevier/Gold Standard. (2007-05-26 13:53:16) Fluorouracil, 5-FU injection Qu es este medicamento? El [place], 5-FU es un agente quimioteraputico. Este medicamento reduce el crecimiento de las clulas cancerosas. Se utiliza en el tratamiento de muchos tipos de cncer, incluyendo el cncer de mama, de colon y recto, pancretico y de Paramedic. Este medicamento puede ser utilizado para otros usos; si tiene alguna pregunta consulte con su proveedor de atencin mdica o con su farmacutico. Qu le debo informar a mi profesional de la salud antes de tomar este medicamento? Necesita saber si usted presenta alguno de los siguientes problemas o situaciones: -trastornos sanguneos -deficiencia de la dihidropirimidina deshidrogenasa (DPD) -infeccin (especialmente infecciones virales, como varicela o herpes) -enfermedad renal -enfermedad heptica -desnutrido, malnutricin -radioterapia reciente o continuada -una reaccin alrgica o inusual al fluorouracilo, a otros agentes quimioteraputicos, otros medicamentos, alimentos, colorantes o conservantes -si est embarazada o buscando quedar embarazada -si est amamantando a un beb Cmo debo utilizar este medicamento? Este medicamento se administra mediante inyeccin o infusin por va intravenosa. Lo administra un profesional de la salud calificado en un hospital o en un entorno clnico. Hable con su pediatra para informarse acerca del uso de este medicamento en nios. Puede requerir atencin especial. Sobredosis: Pngase en contacto inmediatamente con un centro toxicolgico o una sala de urgencia si usted cree que haya tomado  demasiado medicamento. ATENCIN: [employer] es solo para usted. No comparta este medicamento con nadie. Qu sucede si me olvido de una dosis? Es importante no olvidar ninguna dosis. Informe a su mdico o a su profesional de la salud si no puede asistir a Photographer. Qu puede interactuar con este medicamento? -alopurinol -cimetidina -dapsona -digoxina -hidroxiurea -leucovorina -levamisol -medicamentos para convulsiones, tales como etotona, fosfenitona, fenitona -medicamentos para incrementar los conteos sanguneos, tales como filgrastim, pegfilgrastim, sargramostim -medicamentos que tratan o previenen cogulos sanguneos, tales como warfarina, enoxaparina y dalteparina -metotrexato -metronidazol -pirimetamina -otros agentes quimioteraputicos, tales como busulfn, cisplatino, estramustina, vinblastina -trimetoprima -trimetrexato -vacunas Consulte a su mdico o a su profesional de la salud antes de tomar cualquiera de los siguientes medicamentos: -acetaminofeno -aspirina -ibuprofeno -quetoprofeno -naproxeno Puede ser que esta lista no menciona todas las posibles interacciones. Informe a su profesional de [employer]	[place] de [employer] productos a base de hierbas, medicamentos de [place] o suplementos nutritivos que est tomando. Si usted fuma, consume bebidas alcohlicas o si utiliza drogas ilegales, indqueselo tambin a su profesional de [employer]	[place]. Algunas sustancias pueden interactuar con su medicamento. A qu debo estar atento al usar [employer]? Visite a su mdico para chequear su evolucin peridicamente. Este medicamento puede hacerle sentir un Nurse, mental health. Esto es normal ya que la quimioterapia afecta tanto a las clulas sanas como a las clulas cancerosas. Si presenta alguno de los [employer], infrmelos. Sin embargo, contine con  el tratamiento aun si se siente enfermo, a menos que su mdico le indique que lo suspenda. En algunos casos, podr recibir  [employer] para ayudarle con los efectos secundarios. Siga las instrucciones para usarlos. Consulte a su mdico o a su profesional de la salud por asesoramiento si tiene fiebre, escalofros, dolor de garganta o cualquier otro sntoma de resfro o gripe. No se trate usted mismo. Este medicamento puede reducir la capacidad del cuerpo para combatir infecciones. Trate de no acercarse a personas que estn enfermas. [employer] puede aumentar el riesgo de magulladuras o sangrado. Consulte a su mdico o a su profesional de la salud si observa sangrados inusuales. Proceda con cuidado al cepillar sus dientes, usar hilo dental o Risk manager palillos para los dientes, ya que puede contraer una infeccin o Therapist, art con mayor facilidad. Si se somete a algn tratamiento dental, informe a su dentista que est [employer]. Evite tomar productos que contienen aspirina, acetaminofeno, ibuprofeno, naproxeno o quetoprofeno a menos que as lo indique su mdico. Estos productos pueden disimular la fiebre. No se debe quedar embarazada mientras recibe este medicamento. Las mujeres deben informar a su mdico si estn buscando quedar embarazadas o si creen que estn embarazadas. Existe la posibilidad de efectos secundarios graves a un beb sin nacer. Para ms informacin hable con su profesional de la salud o su farmacutico. No debe Economist a un beb mientras est usando este medicamento. Los hombres deben informar a su mdico si quieren tener nios. Este medicamento puede reducir el conteo de esperma. No trate la diarrea con productos de [employer]. Comunquese con su mdico si tiene diarrea que dura ms de 2 das o si es severa y [place]. Este medicamento puede aumentar la sensibilidad al sol. Mantngase fuera de Administrator. Si no lo puede evitar, utilice ropa protectora y crema de Photographer. No utilice lmparas solares, camas solares ni cabinas solares. Qu efectos secundarios puedo tener  al [employer] este medicamento? Efectos secundarios que debe informar a su mdico o a Barrister's clerk de la salud tan pronto como sea posible: -reacciones alrgicas como erupcin cutnea, picazn o urticarias, hinchazn de la cara, labios o lengua -conteos sanguneos bajos - este medicamento puede reducir la cantidad de glbulos blancos, glbulos rojos y plaquetas. Su riesgo de infeccin y [place]. -signos de infeccin - fiebre o escalofros, tos, dolor de garganta, Social research officer, government o dificultad para orinar -signos de reduccin de plaquetas o sangrado - magulladuras, puntos rojos en la piel, heces de color oscuro o con aspecto alquitranado, sangre en la orina -signos de reduccin de glbulos rojos - cansancio o debilidad inusual, desmayos, sensacin de mareo -problemas respiratorios -cambios en la visin -dolor en el pecho -llagas en la boca -nuseas, vmito -dolor, hinchazn, enrojecimiento en el lugar de la inyeccin -hormigueo, dolor, entumecimiento de manos o pies -enrojecimiento, hinchazn o llagas en las manos o pies -dolor estomacal -sangrado inusuales Efectos secundarios que, por lo general, no requieren atencin mdica (debe informarlos a su mdico o a su profesional de la salud si persisten o si son molestos): -cambios en las uas de las manos o pies -diarrea -picazn o sequedad de la piel -cada del cabello -dolor de cabeza -prdida del apetito -sensibilidad de los ojos a la luz -Higher education careers adviser -ojos inusualmente llorosos Puede ser que esta lista no menciona todos los posibles efectos secundarios. Comunquese a su mdico por asesoramiento mdico [employer] Usted puede informar los efectos secundarios a la FDA por telfono  al 1-800-FDA-1088. Dnde debo guardar mi medicina? Este medicamento se administra en hospitales o clnicas y no necesitar guardarlo en su domicilio. ATENCIN: Este folleto es un resumen. Puede ser que no cubra toda la posible  informacin. Si usted tiene preguntas acerca de esta medicina, consulte con su mdico, su farmacutico o su profesional de Technical sales engineer.    2016, Elsevier/Gold Standard. (2014-03-14 00:00:00) Fluorouracil, 5-FU injection Qu es este medicamento? El [place], 5-FU es un agente quimioteraputico. Este medicamento reduce el crecimiento de las clulas cancerosas. Se utiliza en el tratamiento de muchos tipos de cncer, incluyendo el cncer de mama, de colon y recto, pancretico y de Paramedic. Este medicamento puede ser utilizado para otros usos; si tiene alguna pregunta consulte con su proveedor de atencin mdica o con su farmacutico. Qu le debo informar a mi profesional de la salud antes de tomar este medicamento? Necesita saber si usted presenta alguno de los siguientes problemas o situaciones: -trastornos sanguneos -deficiencia de la dihidropirimidina deshidrogenasa (DPD) -infeccin (especialmente infecciones virales, como varicela o herpes) -enfermedad renal -enfermedad heptica -desnutrido, malnutricin -radioterapia reciente o continuada -una reaccin alrgica o inusual al fluorouracilo, a otros agentes quimioteraputicos, otros medicamentos, alimentos, colorantes o conservantes -si est embarazada o buscando quedar embarazada -si est amamantando a un beb Cmo debo utilizar este medicamento? Este medicamento se administra mediante inyeccin o infusin por va intravenosa. Lo administra un profesional de la salud calificado en un hospital o en un entorno clnico. Hable con su pediatra para informarse acerca del uso de este medicamento en nios. Puede requerir atencin especial. Sobredosis: Pngase en contacto inmediatamente con un centro toxicolgico o una sala de urgencia si usted cree que haya tomado demasiado medicamento. ATENCIN: [employer] es solo para usted. No comparta este medicamento con nadie. Qu sucede si me olvido de una dosis? Es importante no olvidar ninguna  dosis. Informe a su mdico o a su profesional de la salud si no puede asistir a Photographer. Qu puede interactuar con este medicamento? -alopurinol -cimetidina -dapsona -digoxina -hidroxiurea -leucovorina -levamisol -medicamentos para convulsiones, tales como etotona, fosfenitona, fenitona -medicamentos para incrementar los conteos sanguneos, tales como filgrastim, pegfilgrastim, sargramostim -medicamentos que tratan o previenen cogulos sanguneos, tales como warfarina, enoxaparina y dalteparina -metotrexato -metronidazol -pirimetamina -otros agentes quimioteraputicos, tales como busulfn, cisplatino, estramustina, vinblastina -trimetoprima -trimetrexato -vacunas Consulte a su mdico o a su profesional de la salud antes de tomar cualquiera de los siguientes medicamentos: -acetaminofeno -aspirina -ibuprofeno -quetoprofeno -naproxeno Puede ser que esta lista no menciona todas las posibles interacciones. Informe a su profesional de [employer]	[place] de [employer] productos a base de hierbas, medicamentos de [place] o suplementos nutritivos que est tomando. Si usted fuma, consume bebidas alcohlicas o si utiliza drogas ilegales, indqueselo tambin a su profesional de [employer]	[place]. Algunas sustancias pueden interactuar con su medicamento. A qu debo estar atento al usar [employer]? Visite a su mdico para chequear su evolucin peridicamente. Este medicamento puede hacerle sentir un Nurse, mental health. Esto es normal ya que la quimioterapia afecta tanto a las clulas sanas como a las clulas cancerosas. Si presenta alguno de los [employer], infrmelos. Sin embargo, contine con el tratamiento aun si se siente enfermo, a menos que su mdico le indique que lo suspenda. En algunos casos, podr recibir [employer] para ayudarle con los efectos secundarios. Siga las instrucciones para usarlos. Consulte a su mdico o a su profesional de la salud por asesoramiento si tiene  fiebre, escalofros, dolor de garganta o cualquier otro sntoma de resfro  o gripe. No se trate usted mismo. Este medicamento puede reducir la capacidad del cuerpo para combatir infecciones. Trate de no acercarse a personas que estn enfermas. [employer] puede aumentar el riesgo de magulladuras o sangrado. Consulte a su mdico o a su profesional de la salud si observa sangrados inusuales. Proceda con cuidado al cepillar sus dientes, usar hilo dental o Risk manager palillos para los dientes, ya que puede contraer una infeccin o Therapist, art con mayor facilidad. Si se somete a algn tratamiento dental, informe a su dentista que est [employer]. Evite tomar productos que contienen aspirina, acetaminofeno, ibuprofeno, naproxeno o quetoprofeno a menos que as lo indique su mdico. Estos productos pueden disimular la fiebre. No se debe quedar embarazada mientras recibe este medicamento. Las mujeres deben informar a su mdico si estn buscando quedar embarazadas o si creen que estn embarazadas. Existe la posibilidad de efectos secundarios graves a un beb sin nacer. Para ms informacin hable con su profesional de la salud o su farmacutico. No debe Economist a un beb mientras est usando este medicamento. Los hombres deben informar a su mdico si quieren tener nios. Este medicamento puede reducir el conteo de esperma. No trate la diarrea con productos de [employer]. Comunquese con su mdico si tiene diarrea que dura ms de 2 das o si es severa y [place]. Este medicamento puede aumentar la sensibilidad al sol. Mantngase fuera de Administrator. Si no lo puede evitar, utilice ropa protectora y crema de Photographer. No utilice lmparas solares, camas solares ni cabinas solares. Qu efectos secundarios puedo tener al [employer] este medicamento? Efectos secundarios que debe informar a su mdico o a Barrister's clerk de la salud tan pronto como sea posible: -reacciones alrgicas como erupcin cutnea,  picazn o urticarias, hinchazn de la cara, labios o lengua -conteos sanguneos bajos - este medicamento puede reducir la cantidad de glbulos blancos, glbulos rojos y plaquetas. Su riesgo de infeccin y [place]. -signos de infeccin - fiebre o escalofros, tos, dolor de garganta, Social research officer, government o dificultad para orinar -signos de reduccin de plaquetas o sangrado - magulladuras, puntos rojos en la piel, heces de color oscuro o con aspecto alquitranado, sangre en la orina -signos de reduccin de glbulos rojos - cansancio o debilidad inusual, desmayos, sensacin de mareo -problemas respiratorios -cambios en la visin -dolor en el pecho -llagas en la boca -nuseas, vmito -dolor, hinchazn, enrojecimiento en el lugar de la inyeccin -hormigueo, dolor, entumecimiento de manos o pies -enrojecimiento, hinchazn o llagas en las manos o pies -dolor estomacal -sangrado inusuales Efectos secundarios que, por lo general, no requieren atencin mdica (debe informarlos a su mdico o a su profesional de la salud si persisten o si son molestos): -cambios en las uas de las manos o pies -diarrea -picazn o sequedad de la piel -cada del cabello -dolor de cabeza -prdida del apetito -sensibilidad de los ojos a la luz -Higher education careers adviser -ojos inusualmente llorosos Puede ser que esta lista no menciona todos los posibles efectos secundarios. Comunquese a su mdico por asesoramiento mdico [employer] Usted puede informar los efectos secundarios a la FDA por telfono al 1-800-FDA-1088. Dnde debo guardar mi medicina? Este medicamento se administra en hospitales o clnicas y no necesitar guardarlo en su domicilio. ATENCIN: Este folleto es un resumen. Puede ser que no cubra toda la posible informacin. Si usted tiene preguntas acerca de esta medicina, consulte con su mdico, su farmacutico o su profesional de Technical sales engineer.    2016, Elsevier/Gold  Standard. (2014-03-14  00:00:00) Leucovorin injection Qu es este medicamento? La LEUCOVORINA se [place] para prevenir o tratar [employer] nocivos de ciertos medicamentos. Este medicamento tambin sirve para tratar la anemia provocada por una nivel bajo de cido flico en el cuerpo. Tambin se puede administrar con 5-fluorouracilo (5-FU), para tratar el cncer de colon. Este medicamento puede ser utilizado para otros usos; si tiene alguna pregunta consulte con su proveedor de atencin mdica o con su farmacutico. Qu le debo informar a mi profesional de la salud antes de tomar este medicamento? Necesita saber si usted presenta alguno de los siguientes problemas o situaciones: -anemia debido a bajos niveles de vitamina B-12 en la sangre -una reaccin alrgica o inusual a la leucovorina, cido flico, a otros medicamentos, alimentos, colorantes o conservantes -si est embarazada o buscando quedar embarazada -si est amamantando a un beb Cmo debo utilizar este medicamento? Este medicamento se administra mediante inyeccin por va intramuscular o intravenosa. Lo administra un profesional de Technical sales engineer en un hospital o en un entorno clnico. Hable con su pediatra para informarse acerca del uso de este medicamento en nios. Puede requerir atencin especial. Sobredosis: Pngase en contacto inmediatamente con un centro toxicolgico o una sala de urgencia si usted cree que haya tomado demasiado medicamento. ATENCIN: [employer] es solo para usted. No comparta este medicamento con nadie. Qu sucede si me olvido de una dosis? No se aplica en este caso. Qu puede interactuar con este medicamento? -capecitabina -fluorouracilo -fenobarbital -fenitona -primidona -trimetoprima- sulfametoxasol Puede ser que esta lista no menciona todas las posibles interacciones. Informe a su profesional de [employer]	[place] de [employer] productos a base de hierbas, medicamentos de [place] o suplementos nutritivos que est tomando. Si  usted fuma, consume bebidas alcohlicas o si utiliza drogas ilegales, indqueselo tambin a su profesional de [employer]	[place]. Algunas sustancias pueden interactuar con su medicamento. A qu debo estar atento al usar [employer]? Se supervisar su estado de salud atentamente mientras reciba este medicamento. Este medicamento puede aumentar los efectos secundarios de 5-fluorouracilo, 5-FU. Si tiene diarrea o [employer] boca que no mejoran o que [place], consulte a su mdico o a su profesional de [employer]	[place]. Qu efectos secundarios puedo tener al [employer] este medicamento? Efectos secundarios que debe informar a su mdico o a Barrister's clerk de la salud tan pronto como sea posible: -Chief of Staff como erupcin cutnea, picazn o urticarias, hinchazn de la cara, labios o lengua -problemas respiratorios -fiebre, infeccin -llagas en la boca -sangrado, magulladuras inusuales -cansancio o debilidad inusual Efectos secundarios que, por lo general, no requieren atencin mdica (debe informarlos a su mdico o a su profesional de la salud si persisten o si son molestos): -estreimiento o diarrea -prdida del apetito -nuseas, vmito Puede ser que [employer] no menciona todos los posibles efectos secundarios. Comunquese a su mdico por asesoramiento mdico [employer]. Usted puede informar los efectos secundarios a la FDA por telfono al 1-800-FDA-1088. Dnde debo guardar mi medicina? Este medicamento se administra en hospitales o clnicas y no necesitar guardarlo en su domicilio. ATENCIN: Este folleto es un resumen. Puede ser que no cubra toda la posible informacin. Si usted tiene preguntas acerca de esta medicina, consulte con su mdico, su farmacutico o su profesional de Technical sales engineer.    2016, Elsevier/Gold Standard. (2014-03-14 00:00:00) Leucovorin injection What is this medicine? LEUCOVORIN (loo koe VOR in) is used to prevent or treat the harmful effects of some  medicines. This medicine is used to treat  anemia caused by a low amount of folic acid in the body. It is also used with 5-fluorouracil (5-FU) to treat colon cancer. This medicine may be used for other purposes; ask your health care provider or pharmacist if you have questions. What should I tell my health care provider before I take this medicine? They need to know if you have any of these conditions: -anemia from low levels of vitamin B-12 in the blood -an unusual or allergic reaction to leucovorin, folic acid, other medicines, foods, dyes, or preservatives -pregnant or trying to get pregnant -breast-feeding How should I use this medicine? This medicine is for injection into a muscle or into a vein. It is given by a health care professional in a hospital or clinic setting. Talk to your pediatrician regarding the use of this medicine in children. Special care may be needed. Overdosage: If you think you have taken too much of this medicine contact a poison control center or emergency room at once. NOTE: This medicine is only for you. Do not share this medicine with others. What if I miss a dose? This does not apply. What may interact with this medicine? -capecitabine -fluorouracil -phenobarbital -phenytoin -primidone -trimethoprim-sulfamethoxazole This list may not describe all possible interactions. Give your health care provider a list of all the medicines, herbs, non-prescription drugs, or dietary supplements you use. Also tell them if you smoke, drink alcohol, or use illegal drugs. Some items may interact with your medicine. What should I watch for while using this medicine? Your condition will be monitored carefully while you are receiving this medicine. This medicine may increase the side effects of 5-fluorouracil, 5-FU. Tell your doctor or health care professional if you have diarrhea or mouth sores that do not get better or that get worse. What side effects may I notice from  receiving this medicine? Side effects that you should report to your doctor or health care professional as soon as possible: -allergic reactions like skin rash, itching or hives, swelling of the face, lips, or tongue -breathing problems -fever, infection -mouth sores -unusual bleeding or bruising -unusually weak or tired Side effects that usually do not require medical attention (report to your doctor or health care professional if they continue or are bothersome): -constipation or diarrhea -loss of appetite -nausea, vomiting This list may not describe all possible side effects. Call your doctor for medical advice about side effects. You may report side effects to FDA at 1-800-FDA-1088. Where should I keep my medicine? This drug is given in a hospital or clinic and will not be stored at home. NOTE: This sheet is a summary. It may not cover all possible information. If you have questions about this medicine, talk to your doctor, pharmacist, or health care provider.    2016, Elsevier/Gold Standard. (2007-07-27 16:50:29)

## 2015-08-08 NOTE — Progress Notes (Signed)
Patient ambulates without assistance, brought to exam room 2, accompanied by family. (assisted by interperter)  Patient c/o acute right lower abdominal discomfort when he inhales deeply.   Patient c/o n/v today, states he experienced diarrhea 08/07/15 but having more like constipation symptoms today 08/08/15.  Patient states last BM was 08/08/15.  BP 128/85, vitals documented..  Medication record updated information provided by patient. Dr. Ree Kida.

## 2015-08-09 ENCOUNTER — Other Ambulatory Visit: Payer: Self-pay | Admitting: Hematology and Oncology

## 2015-08-09 ENCOUNTER — Inpatient Hospital Stay: Payer: 59

## 2015-08-09 DIAGNOSIS — B9681 Helicobacter pylori [H. pylori] as the cause of diseases classified elsewhere: Secondary | ICD-10-CM

## 2015-08-09 DIAGNOSIS — K2970 Gastritis, unspecified, without bleeding: Principal | ICD-10-CM

## 2015-08-09 MED ORDER — AMOXICILLIN 500 MG PO TABS: ORAL_TABLET | ORAL | Status: DC

## 2015-08-09 MED ORDER — BIS SUBCIT-METRONID-TETRACYC 140-125-125 MG PO CAPS: 3.0000 | ORAL_CAPSULE | Freq: Three times a day (TID) | ORAL | Status: DC

## 2015-08-09 MED ORDER — OMEPRAZOLE 20 MG PO CPDR: DELAYED_RELEASE_CAPSULE | ORAL | Status: DC

## 2015-08-09 MED ORDER — CLARITHROMYCIN 500 MG PO TABS: 500.0000 mg | ORAL_TABLET | Freq: Two times a day (BID) | ORAL | Status: DC

## 2015-08-10 ENCOUNTER — Ambulatory Visit: Payer: Self-pay | Admitting: Hematology and Oncology

## 2015-08-10 ENCOUNTER — Telehealth: Payer: Self-pay

## 2015-08-10 ENCOUNTER — Ambulatory Visit: Payer: Self-pay

## 2015-08-10 NOTE — Telephone Encounter (Signed)
Spoke with [facility] regarding pt taking new antibiotics.  Jose reports that pt is having a racing heart and skipping and not feeling well. Not sure if related to antibiotics or  anxiety.  I advised Jose per Dr. Mike Gip to take pt to the ER.  Per jose they plan to do so.  No other concerns noted.

## 2015-08-13 ENCOUNTER — Encounter
Admission: RE | Disposition: A | Discharge status: Home / Self Care | Payer: Self-pay | Source: Ambulatory Visit | Attending: Vascular Surgery

## 2015-08-13 ENCOUNTER — Ambulatory Visit
Admission: RE | Admit: 2015-08-13 | Discharge: 2015-08-13 | Disposition: A | Discharge status: Home / Self Care | Payer: 59 | Source: Ambulatory Visit | Attending: Vascular Surgery | Admitting: Vascular Surgery

## 2015-08-13 ENCOUNTER — Encounter: Payer: Self-pay | Admitting: *Deleted

## 2015-08-13 ENCOUNTER — Inpatient Hospital Stay: Payer: 59

## 2015-08-13 DIAGNOSIS — C168 Malignant neoplasm of overlapping sites of stomach: Secondary | ICD-10-CM

## 2015-08-13 DIAGNOSIS — C7889 Secondary malignant neoplasm of other digestive organs: Secondary | ICD-10-CM | POA: Insufficient documentation

## 2015-08-13 DIAGNOSIS — R110 Nausea: Secondary | ICD-10-CM | POA: Insufficient documentation

## 2015-08-13 DIAGNOSIS — K219 Gastro-esophageal reflux disease without esophagitis: Secondary | ICD-10-CM | POA: Insufficient documentation

## 2015-08-13 DIAGNOSIS — Z792 Long term (current) use of antibiotics: Secondary | ICD-10-CM | POA: Insufficient documentation

## 2015-08-13 HISTORY — PX: PERIPHERAL VASCULAR CATHETERIZATION: SHX172C

## 2015-08-13 SURGERY — PORTA CATH INSERTION
Anesthesia: Moderate Sedation

## 2015-08-13 MED ORDER — MIDAZOLAM HCL 5 MG/5ML IJ SOLN
INTRAMUSCULAR | Status: AC
  Filled 2015-08-13: qty 5

## 2015-08-13 MED ORDER — FENTANYL CITRATE (PF) 100 MCG/2ML IJ SOLN
INTRAMUSCULAR | Status: DC | PRN
  Administered 2015-08-13 (×2): 50 ug via INTRAVENOUS

## 2015-08-13 MED ORDER — DEXTROSE 5 % IV SOLN
1.5000 g | Freq: Once | INTRAVENOUS | Status: AC
  Administered 2015-08-13: 1.5 g via INTRAVENOUS

## 2015-08-13 MED ORDER — LIDOCAINE-EPINEPHRINE (PF) 1 %-1:200000 IJ SOLN
INTRAMUSCULAR | Status: AC
  Filled 2015-08-13: qty 30

## 2015-08-13 MED ORDER — SODIUM CHLORIDE 0.9 % IV SOLN
INTRAVENOUS | Status: DC
  Administered 2015-08-13: 14:00:00 via INTRAVENOUS

## 2015-08-13 MED ORDER — SODIUM CHLORIDE 0.9 % IR SOLN
80.0000 mg | Freq: Once | Status: DC
  Filled 2015-08-13: qty 2

## 2015-08-13 MED ORDER — MIDAZOLAM HCL 2 MG/2ML IJ SOLN
INTRAMUSCULAR | Status: DC | PRN
  Administered 2015-08-13: 1 mg via INTRAVENOUS
  Administered 2015-08-13: 2 mg via INTRAVENOUS
  Administered 2015-08-13 (×2): 1 mg via INTRAVENOUS

## 2015-08-13 MED ORDER — FENTANYL CITRATE (PF) 100 MCG/2ML IJ SOLN
INTRAMUSCULAR | Status: AC
  Filled 2015-08-13: qty 2

## 2015-08-13 MED ORDER — HEPARIN (PORCINE) IN NACL 2-0.9 UNIT/ML-% IJ SOLN
INTRAMUSCULAR | Status: AC
  Filled 2015-08-13: qty 500

## 2015-08-13 SURGICAL SUPPLY — 10 items
BAG DECANTER STRL (MISCELLANEOUS) ×3 IMPLANT
KIT PORT POWER 8FR ISP CVUE (Catheter) ×3 IMPLANT
PACK ANGIOGRAPHY (CUSTOM PROCEDURE TRAY) ×3 IMPLANT
PAD GROUND ADULT SPLIT (MISCELLANEOUS) ×3 IMPLANT
PENCIL ELECTRO HAND CTR (MISCELLANEOUS) ×3 IMPLANT
PREP CHG 10.5 TEAL (MISCELLANEOUS) ×3 IMPLANT
SUT MNCRL AB 4-0 PS2 18 (SUTURE) ×3 IMPLANT
SUT PROLENE 0 CT 1 30 (SUTURE) ×3 IMPLANT
SUTURE VIC 3-0 (SUTURE) ×3 IMPLANT
TOWEL OR 17X26 4PK STRL BLUE (TOWEL DISPOSABLE) ×3 IMPLANT

## 2015-08-13 NOTE — Op Note (Signed)
      [facility]       Operative Note  Date: 08/13/2015  Preoperative diagnosis:  1. Gastric cancer, metastatic  Postoperative diagnosis:  Same as above  Procedures: #1. Ultrasound guidance for vascular access to the right internal jugular vein. #2. Fluoroscopic guidance for placement of catheter. #3. Placement of CT compatible Port-A-Cath, right internal jugular vein.  Surgeon: Leotis Pain, MD.   Anesthesia: Local with moderate conscious sedation for approximately 20  minutes using 5 mg of Versed and 100 mcg of Fentanyl  Fluoroscopy time: less than 1 minute  Contrast used: 0  Estimated blood loss: minimal  Indication for the procedure:  The patient is a 35 y.o.male with metastatic gastric cancer.  The patient needs a Port-A-Cath for durable venous access, chemotherapy, lab draws, and CT scans. We are asked to place this. Risks and benefits were discussed and informed consent was obtained.  Description of procedure: The patient was brought to the vascular and interventional radiology suite.  Moderate conscious sedation was administered throughout the procedure during a face to face encounter with the patient with my supervision of the RN administering medicines and monitoring the patient's vital signs, pulse oximetry, telemetry and mental status throughout from the start of the procedure until the patient was taken to the recovery room. The right neck chest and shoulder were sterilely prepped and draped, and a sterile surgical field was created. Ultrasound was used to help visualize a patent right internal jugular vein. This was then accessed under direct ultrasound guidance without difficulty with the Seldinger needle and a permanent image was recorded. A J-wire was placed. After skin nick and dilatation, the peel-away sheath was then placed over the wire. I then anesthetized an area under the clavicle approximately 1-2 fingerbreadths. A transverse incision was created  and an inferior pocket was created with electrocautery and blunt dissection. The port was then brought onto the field, placed into the pocket and secured to the chest wall with 2 Prolene sutures. The catheter was connected to the port and tunneled from the subclavicular incision to the access site. Fluoroscopic guidance was then used to cut the catheter to an appropriate length. The catheter was then placed through the peel-away sheath and the peel-away sheath was removed. The catheter tip was parked in excellent location under fluorocoscopic guidance in the cavoatrial junction. The pocket was then irrigated with antibiotic impregnated saline and the wound was closed with a running 3-0 Vicryl and a 4-0 Monocryl. The access incision was closed with a single 4-0 Monocryl. The Huber needle was used to withdraw blood and flush the port with heparinized saline. Dermabond was then placed as a dressing. The patient tolerated the procedure well and was taken to the recovery room in stable condition.   DEW,JASON 08/13/2015 4:13 PM

## 2015-08-13 NOTE — Progress Notes (Signed)
Procedure done, tolerated well, vss during entire procedure, responds readily

## 2015-08-13 NOTE — H&P (Signed)
  [facility] History & Physical Update  The patient was interviewed and re-examined.  The patient's previous History and Physical has been reviewed and is unchanged.  There is no change in the plan of care. We plan to proceed with the scheduled procedure.  DEW,JASON, MD  08/13/2015, 1:05 PM

## 2015-08-14 ENCOUNTER — Inpatient Hospital Stay (HOSPITAL_BASED_OUTPATIENT_CLINIC_OR_DEPARTMENT_OTHER): Payer: 59 | Admitting: Hematology and Oncology

## 2015-08-14 ENCOUNTER — Inpatient Hospital Stay: Payer: 59

## 2015-08-14 ENCOUNTER — Encounter: Payer: Self-pay | Admitting: Vascular Surgery

## 2015-08-14 ENCOUNTER — Other Ambulatory Visit: Payer: Self-pay | Admitting: Hematology and Oncology

## 2015-08-14 VITALS — BP 124/86 | HR 90 | Temp 98.1°F | Resp 18 | Wt 182.2 lb

## 2015-08-14 DIAGNOSIS — B9681 Helicobacter pylori [H. pylori] as the cause of diseases classified elsewhere: Secondary | ICD-10-CM

## 2015-08-14 DIAGNOSIS — C786 Secondary malignant neoplasm of retroperitoneum and peritoneum: Secondary | ICD-10-CM | POA: Diagnosis not present

## 2015-08-14 DIAGNOSIS — R718 Other abnormality of red blood cells: Secondary | ICD-10-CM

## 2015-08-14 DIAGNOSIS — Z79899 Other long term (current) drug therapy: Secondary | ICD-10-CM

## 2015-08-14 DIAGNOSIS — E876 Hypokalemia: Secondary | ICD-10-CM

## 2015-08-14 DIAGNOSIS — C168 Malignant neoplasm of overlapping sites of stomach: Secondary | ICD-10-CM

## 2015-08-14 DIAGNOSIS — R109 Unspecified abdominal pain: Secondary | ICD-10-CM

## 2015-08-14 DIAGNOSIS — K2970 Gastritis, unspecified, without bleeding: Secondary | ICD-10-CM | POA: Diagnosis not present

## 2015-08-14 DIAGNOSIS — K219 Gastro-esophageal reflux disease without esophagitis: Secondary | ICD-10-CM

## 2015-08-14 DIAGNOSIS — R634 Abnormal weight loss: Secondary | ICD-10-CM

## 2015-08-14 LAB — CBC WITH DIFFERENTIAL/PLATELET
Basophils Absolute: 0 10*3/uL (ref 0–0.1)
Basophils Relative: 0 %
Eosinophils Absolute: 0 10*3/uL (ref 0–0.7)
Eosinophils Relative: 1 %
HCT: 41.6 % (ref 40.0–52.0)
Hemoglobin: 14.3 g/dL (ref 13.0–18.0)
Lymphocytes Relative: 11 %
Lymphs Abs: 1 10*3/uL (ref 1.0–3.6)
MCH: 27.4 pg (ref 26.0–34.0)
MCHC: 34.3 g/dL (ref 32.0–36.0)
MCV: 79.8 fL — ABNORMAL LOW (ref 80.0–100.0)
Monocytes Absolute: 0.5 10*3/uL (ref 0.2–1.0)
Monocytes Relative: 6 %
Neutro Abs: 7.5 10*3/uL — ABNORMAL HIGH (ref 1.4–6.5)
Neutrophils Relative %: 82 %
Platelets: 304 10*3/uL (ref 150–440)
RBC: 5.21 MIL/uL (ref 4.40–5.90)
RDW: 12.3 % (ref 11.5–14.5)
WBC: 9.1 10*3/uL (ref 3.8–10.6)

## 2015-08-14 LAB — COMPREHENSIVE METABOLIC PANEL
ALT: 15 U/L — ABNORMAL LOW (ref 17–63)
AST: 18 U/L (ref 15–41)
Albumin: 3.7 g/dL (ref 3.5–5.0)
Alkaline Phosphatase: 71 U/L (ref 38–126)
Anion gap: 6 (ref 5–15)
BUN: 10 mg/dL (ref 6–20)
CO2: 27 mmol/L (ref 22–32)
Calcium: 8.5 mg/dL — ABNORMAL LOW (ref 8.9–10.3)
Chloride: 101 mmol/L (ref 101–111)
Creatinine, Ser: 0.72 mg/dL (ref 0.61–1.24)
GFR calc Af Amer: >60 mL/min (ref >60)
GFR calc non Af Amer: >60 mL/min (ref >60)
Glucose, Bld: 102 mg/dL — ABNORMAL HIGH (ref 65–99)
Potassium: 3.4 mmol/L — ABNORMAL LOW (ref 3.5–5.1)
Sodium: 134 mmol/L — ABNORMAL LOW (ref 135–145)
Total Bilirubin: 0.8 mg/dL (ref 0.3–1.2)
Total Protein: 7.3 g/dL (ref 6.5–8.1)

## 2015-08-14 MED ORDER — PALONOSETRON HCL INJECTION 0.25 MG/5ML
0.2500 mg | Freq: Once | INTRAVENOUS | Status: AC
  Administered 2015-08-14: 0.25 mg via INTRAVENOUS
  Filled 2015-08-14: qty 5

## 2015-08-14 MED ORDER — PROCHLORPERAZINE MALEATE 10 MG PO TABS: 10.0000 mg | ORAL_TABLET | Freq: Four times a day (QID) | ORAL | Status: DC | PRN

## 2015-08-14 MED ORDER — FLUOROURACIL CHEMO INJECTION 2.5 GM/50ML
400.0000 mg/m2 | Freq: Once | INTRAVENOUS | Status: AC
  Administered 2015-08-14: 800 mg via INTRAVENOUS
  Filled 2015-08-14: qty 16

## 2015-08-14 MED ORDER — POTASSIUM CHLORIDE ER 10 MEQ PO TBCR
EXTENDED_RELEASE_TABLET | ORAL | Status: DC
Start: 2015-08-14 — End: 2016-02-19

## 2015-08-14 MED ORDER — DEXTROSE 5 % IV SOLN
Freq: Once | INTRAVENOUS | Status: AC
Start: 2015-08-14 — End: 2015-08-14
  Administered 2015-08-14: 10:00:00 via INTRAVENOUS
  Filled 2015-08-14: qty 1000

## 2015-08-14 MED ORDER — SODIUM CHLORIDE 0.9% FLUSH
10.0000 mL | INTRAVENOUS | Status: DC | PRN
  Administered 2015-08-14: 10 mL via INTRAVENOUS
  Filled 2015-08-14: qty 10

## 2015-08-14 MED ORDER — DEXTROSE 5 % IV SOLN
85.0000 mg/m2 | Freq: Once | INTRAVENOUS | Status: AC
  Administered 2015-08-14: 165 mg via INTRAVENOUS
  Filled 2015-08-14: qty 33

## 2015-08-14 MED ORDER — SODIUM CHLORIDE 0.9 % IV SOLN
2400.0000 mg/m2 | INTRAVENOUS | Status: DC
  Administered 2015-08-14: 4700 mg via INTRAVENOUS
  Filled 2015-08-14: qty 94

## 2015-08-14 MED ORDER — LEUCOVORIN CALCIUM INJECTION 350 MG
800.0000 mg | Freq: Once | INTRAVENOUS | Status: AC
  Administered 2015-08-14: 800 mg via INTRAVENOUS
  Filled 2015-08-14: qty 25

## 2015-08-14 MED ORDER — HEPARIN SOD (PORK) LOCK FLUSH 100 UNIT/ML IV SOLN: 500.0000 [IU] | Freq: Once | INTRAVENOUS | Status: DC

## 2015-08-14 MED ORDER — SODIUM CHLORIDE 0.9 % IV SOLN
10.0000 mg | Freq: Once | INTRAVENOUS | Status: AC
  Administered 2015-08-14: 10 mg via INTRAVENOUS
  Filled 2015-08-14: qty 1

## 2015-08-14 MED ORDER — OMEPRAZOLE 20 MG PO CPDR: 20.0000 mg | DELAYED_RELEASE_CAPSULE | Freq: Two times a day (BID) | ORAL | Status: DC

## 2015-08-14 NOTE — Progress Notes (Signed)
[facility] day:  08/14/2015  Chief Complaint: Anthony Melendez is a 35 y.o. male with metastatic gastric cancer who is seen for assessment prior to cycle #1 FOLFOX chemotherapy.  HPI:  The patient was last seen 08/08/2015.  At that time, results from his EGD were reviewed.  We discussed his diagnosis of metastatic gastric cancer and plan for treatment.  We discussed potential enrollment on clinical trial.  We discussed management of H pylori.  He was prescribed amoxicillin, clarithromycin and omeprazole.  We discussed FOLFOX chemotherapy +/- Herceptin based on Her2/neu status.  We discussed clinical trail enrollment.  [facility] was contacted.  There is no open trial for upfront therapy.  There are currently trials for second line therapy.  He began treatment for H pylori on 08/09/2015.  Port-a-cath was placed on 08/13/2015.  He and his family had chemotherapy teaching.  During the interim, he has pins and needle sensation in his abdomen.  He notes that it hurts in his abdomen when he urinates.  He denies any dysuria, urgency, frequency, back pain or fever.   Past Medical History  Diagnosis Date  . GERD (gastroesophageal reflux disease)     Past Surgical History  Procedure Laterality Date  . No past surgeries    . Esophagogastroduodenoscopy (egd) with propofol N/A 08/02/2015    Procedure: ESOPHAGOGASTRODUODENOSCOPY (EGD) WITH PROPOFOL;  Surgeon: Lucilla Lame, MD;  Location: [facility];  Service: Endoscopy;  Laterality: N/A;  . Peripheral vascular catheterization N/A 08/13/2015    Procedure: Porta Cath Insertion;  Surgeon: Algernon Huxley, MD;  Location: [facility];  Service: Cardiovascular;  Laterality: N/A;    No family history on file.  Social History:  reports that he has never smoked. He does not have any smokeless tobacco history on file. He reports that he does not drink alcohol or use illicit drugs.  The patient is from [place].   He speaks [place].  He moves to the [place] in 2000.  He lives in [place] with his father, mother, brother and sister.  He is a Theme park manager.  The patient is accompanied by his father and brother today.  The interpreter is present.  Allergies: No Known Allergies  Current Medications: Current Outpatient Prescriptions  Medication Sig Dispense Refill  . amoxicillin (AMOXIL) 500 MG tablet Take 2 tablets (1000 mg) by mouth twice a day for 14 days 56 tablet 0  . clarithromycin (BIAXIN) 500 MG tablet Take 1 tablet (500 mg total) by mouth 2 (two) times daily. 28 tablet 0  . omeprazole (PRILOSEC) 20 MG capsule Take 1 capsule (20 mg total) by mouth 2 (two) times daily. 30 capsule 1   No current facility-administered medications for this visit.   Facility-Administered Medications Ordered in Other Visits  Medication Dose Route Frequency Provider Last Rate Last Dose  . heparin lock flush 100 unit/mL  500 Units Intravenous Once Lequita Asal, MD      . sodium chloride flush (NS) 0.9 % injection 10 mL  10 mL Intravenous PRN Lequita Asal, MD   10 mL at 08/14/15 8416    Review of Systems:  GENERAL:  Fatigue.  No fevers or sweats.  Weight up 1 pound since last visit. PERFORMANCE STATUS (ECOG):  1 HEENT:  No visual changes, runny nose, sore throat, mouth sores or tenderness. Lungs: No shortness of breath or cough.  No hemoptysis. Cardiac:  No chest pain, palpitations, orthopnea, or PND. GI:   Abdominal  pain described as pins and needles.  Lower abdominal pain when urinates.  Nausea.  No diarrhea, constipation, melena or hematochezia. GU:  No urgency, frequency, dysuria, or hematuria. Musculoskeletal:  Back pain.  No joint pain.  No muscle tenderness. Extremities:  No pain or swelling. Skin:  No rashes or skin changes. Neuro:  No headache, numbness or weakness, balance or coordination issues. Endocrine:  No diabetes, thyroid issues, hot flashes or night sweats. Psych:  No mood changes,  depression or anxiety. Pain:  No focal pain. Review of systems:  All other systems reviewed and found to be negative.  Physical Exam: Blood pressure 124/86, pulse 90, temperature 98.1 F (36.7 C), temperature source Tympanic, resp. rate 18, weight 182 lb 3.4 oz (82.65 kg). GENERAL:  Well developed, well nourished, gentleman sitting comfortably in the exam room in no acute distress. MENTAL STATUS:  Alert and oriented to person, place and time. HEAD:  Wearing a baseball cap.  Black hair.  Mustache.  Normocephalic, atraumatic, face symmetric, no Cushingoid features. EYES:  Brown eyes.  No conjunctivitis or scleral icterus. ENT:  Oropharynx clear without lesion.  Tongue normal. Mucous membranes moist.  RESPIRATORY:  Clear to auscultation without rales, wheezes or rhonchi. CARDIOVASCULAR:  Regular rate and rhythm without murmur, rub or gallop. ABDOMEN:  Soft, non-tender, with active bowel sounds, and no hepatosplenomegaly.  No masses. SKIN:  No rashes, ulcers or lesions. EXTREMITIES: No edema, no skin discoloration or tenderness.  No palpable cords. LYMPH NODES: No palpable cervical, supraclavicular, axillary or inguinal adenopathy  NEUROLOGICAL: Unremarkable. PSYCH:  Appropriate.  NEUROLOGICAL: Appropriate.   Infusion on 08/14/2015  Component Date Value Ref Range Status  . WBC 08/14/2015 9.1  3.8 - 10.6 K/uL Final  . RBC 08/14/2015 5.21  4.40 - 5.90 MIL/uL Final  . Hemoglobin 08/14/2015 14.3  13.0 - 18.0 g/dL Final  . HCT 08/14/2015 41.6  40.0 - 52.0 % Final  . MCV 08/14/2015 79.8* 80.0 - 100.0 fL Final  . MCH 08/14/2015 27.4  26.0 - 34.0 pg Final  . MCHC 08/14/2015 34.3  32.0 - 36.0 g/dL Final  . RDW 08/14/2015 12.3  11.5 - 14.5 % Final  . Platelets 08/14/2015 304  150 - 440 K/uL Final  . Neutrophils Relative % 08/14/2015 82   Final  . Neutro Abs 08/14/2015 7.5* 1.4 - 6.5 K/uL Final  . Lymphocytes Relative 08/14/2015 11   Final  . Lymphs Abs 08/14/2015 1.0  1.0 - 3.6 K/uL Final  .  Monocytes Relative 08/14/2015 6   Final  . Monocytes Absolute 08/14/2015 0.5  0.2 - 1.0 K/uL Final  . Eosinophils Relative 08/14/2015 1   Final  . Eosinophils Absolute 08/14/2015 0.0  0 - 0.7 K/uL Final  . Basophils Relative 08/14/2015 0   Final  . Basophils Absolute 08/14/2015 0.0  0 - 0.1 K/uL Final    Assessment:  Anthony Melendez is a 35 y.o. male with metastatic gastric cancer.  He presented with a 4-6 week history of progressive epigastric pain.  EGD on 08/02/2015 revealed a large, ulcerated, non-circumferential mass with oozing and stigmata of recent bleeding in the entire examined stomach.  The mass involved the fundus down to the antrum.  Biopsies revealed poorly differentiated adenocarcinoma.  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  Chest, abdomen and pelvic CT scan on 07/29/2015 revealed a 4 cm thickened and irregular gastric wall compatible with an infiltrative malignancy. There was extensive involvement of the upper mesentery and omentum compatible with peritoneal carcinomatosis.  There was a small amount of ascites.   Head CT on 08/08/2015 revealed no evidence of metastatic disease.  He began treatment for H pylori on 08/09/2015.  RBC are microcytic likely due to iron deficiency anemia.  Symptomatically, he has abdominal pain described as pins and needles as well as lower abdominal pain with urination.  He has lost 30 pounds in the past month.  Weight is stable.  He has hypokalemia (3.4).  Plan: 1.  Labs today:  CBC with diff, CMP, Mg. 2.  Review chemotherapy plan with FOLFOX chemotherapy every 2 weeks.  Side effects reviewed.  Discussed infusion pump.  Discussed diarrhea and management with Imodium.  Discussed nausea.  Discuss use of Compazine as needed.  Discuss contacting clinic if any concerns.  Discussed reassessment in 1 week.  Patient consented to treatment. 3.  Discuss genetic testing with [facility] gastric panel once kit is available  [id number]). 4.  Discuss treatment of H pylori.  He started triple therapy on 08/09/2015.  Discuss plan for stool testing 6 weeks after completion of antibiotics (and off omeprazole). 5.  Follow-up Her2/neu and MSI testing. 6.  Rx:  Compazine and potassium chloride. 7.  Cycle #1 FOLFOX today. 8.  RTC in 2 days for disconnect. 9.  RTC in 1 week for MD assessment and labs (CBC with diff, BMP, ferritin, iron studies). 10.  RTC in 2 weeks for MD assessment, labs (CBC with diff, CMP, Mg), and cycle #2 FOLFOX chemotherapy.   Lequita Asal, MD  08/14/2015

## 2015-08-14 NOTE — Progress Notes (Signed)
Patient is here for follow up, he mentions he gets hungry but feels full, he notes pin and needle sensation in his abdomen area. Sometimes it hurts when he urinates and when he strains he has pain.

## 2015-08-15 ENCOUNTER — Telehealth: Payer: Self-pay

## 2015-08-15 NOTE — Telephone Encounter (Signed)
-----   Message from Lucilla Lame, MD sent at 08/07/2015  8:29 AM EDT ----- Anthony Melendez, Please have this patient come in to discuss the results of his Biopsy.

## 2015-08-16 ENCOUNTER — Inpatient Hospital Stay: Payer: 59

## 2015-08-16 VITALS — BP 108/70 | HR 75 | Temp 97.2°F | Resp 18

## 2015-08-16 DIAGNOSIS — C168 Malignant neoplasm of overlapping sites of stomach: Secondary | ICD-10-CM

## 2015-08-16 MED ORDER — HEPARIN SOD (PORK) LOCK FLUSH 100 UNIT/ML IV SOLN
500.0000 [IU] | Freq: Once | INTRAVENOUS | Status: AC | PRN
  Administered 2015-08-16: 500 [IU]
  Filled 2015-08-16: qty 5

## 2015-08-16 MED ORDER — SODIUM CHLORIDE 0.9% FLUSH
10.0000 mL | INTRAVENOUS | Status: DC | PRN
  Administered 2015-08-16: 10 mL
  Filled 2015-08-16: qty 10

## 2015-08-16 NOTE — Telephone Encounter (Signed)
Pt scheduled for a follow up on Wed, July 26th.

## 2015-08-17 ENCOUNTER — Ambulatory Visit: Payer: 59

## 2015-08-17 ENCOUNTER — Telehealth: Payer: Self-pay | Admitting: *Deleted

## 2015-08-17 ENCOUNTER — Ambulatory Visit: Payer: 59 | Admitting: Hematology and Oncology

## 2015-08-17 ENCOUNTER — Other Ambulatory Visit: Payer: 59

## 2015-08-17 NOTE — Telephone Encounter (Signed)
Pt's sister on the phone and I can hear the pt. On the phone they are both listening.  She tells me ha is bloated and has not ate a lot today. He had pump taken off yest. I asked when he had last BM and it was this morning and he has one a day.  It was a normal BM. I asked if he was nauseated, pain, vomiting and he said no.  His only additional explanation was gassy.  I told he and his sister that I would ask md and call them back and I checked with her and she states for pt to try mylicon or gas x either one to see if that alleviates his gas.  If his belly swells up and having dif.breathing or bending over in pain then can call on call md or go to ER. Gave phone # and told them it will go to answering service, tell them the problem and md will call within 20 min. Normally. They understand

## 2015-08-19 ENCOUNTER — Encounter: Payer: Self-pay | Admitting: Hematology and Oncology

## 2015-08-21 ENCOUNTER — Inpatient Hospital Stay: Payer: 59

## 2015-08-21 ENCOUNTER — Inpatient Hospital Stay (HOSPITAL_BASED_OUTPATIENT_CLINIC_OR_DEPARTMENT_OTHER): Payer: 59 | Admitting: Hematology and Oncology

## 2015-08-21 ENCOUNTER — Encounter: Payer: Self-pay | Admitting: Hematology and Oncology

## 2015-08-21 VITALS — BP 128/90 | HR 70 | Temp 97.9°F | Resp 18 | Wt 176.6 lb

## 2015-08-21 DIAGNOSIS — B9681 Helicobacter pylori [H. pylori] as the cause of diseases classified elsewhere: Secondary | ICD-10-CM

## 2015-08-21 DIAGNOSIS — K219 Gastro-esophageal reflux disease without esophagitis: Secondary | ICD-10-CM

## 2015-08-21 DIAGNOSIS — Z79899 Other long term (current) drug therapy: Secondary | ICD-10-CM

## 2015-08-21 DIAGNOSIS — C168 Malignant neoplasm of overlapping sites of stomach: Secondary | ICD-10-CM

## 2015-08-21 DIAGNOSIS — E876 Hypokalemia: Secondary | ICD-10-CM

## 2015-08-21 DIAGNOSIS — C786 Secondary malignant neoplasm of retroperitoneum and peritoneum: Secondary | ICD-10-CM

## 2015-08-21 DIAGNOSIS — K2970 Gastritis, unspecified, without bleeding: Secondary | ICD-10-CM | POA: Diagnosis not present

## 2015-08-21 DIAGNOSIS — R718 Other abnormality of red blood cells: Secondary | ICD-10-CM

## 2015-08-21 DIAGNOSIS — R109 Unspecified abdominal pain: Secondary | ICD-10-CM

## 2015-08-21 DIAGNOSIS — C801 Malignant (primary) neoplasm, unspecified: Secondary | ICD-10-CM

## 2015-08-21 DIAGNOSIS — R634 Abnormal weight loss: Secondary | ICD-10-CM

## 2015-08-21 LAB — CBC WITH DIFFERENTIAL/PLATELET
Basophils Absolute: 0 10*3/uL (ref 0–0.1)
Basophils Relative: 1 %
Eosinophils Absolute: 0.2 10*3/uL (ref 0–0.7)
Eosinophils Relative: 4 %
HCT: 39.7 % — ABNORMAL LOW (ref 40.0–52.0)
Hemoglobin: 13.7 g/dL (ref 13.0–18.0)
Lymphocytes Relative: 28 %
Lymphs Abs: 1.2 10*3/uL (ref 1.0–3.6)
MCH: 27.1 pg (ref 26.0–34.0)
MCHC: 34.4 g/dL (ref 32.0–36.0)
MCV: 78.9 fL — ABNORMAL LOW (ref 80.0–100.0)
Monocytes Absolute: 0.2 10*3/uL (ref 0.2–1.0)
Monocytes Relative: 5 %
Neutro Abs: 2.8 10*3/uL (ref 1.4–6.5)
Neutrophils Relative %: 64 %
Platelets: 258 10*3/uL (ref 150–440)
RBC: 5.04 MIL/uL (ref 4.40–5.90)
RDW: 12.5 % (ref 11.5–14.5)
WBC: 4.3 10*3/uL (ref 3.8–10.6)

## 2015-08-21 LAB — BASIC METABOLIC PANEL
Anion gap: 5 (ref 5–15)
BUN: 10 mg/dL (ref 6–20)
CO2: 28 mmol/L (ref 22–32)
Calcium: 8.7 mg/dL — ABNORMAL LOW (ref 8.9–10.3)
Chloride: 103 mmol/L (ref 101–111)
Creatinine, Ser: 0.82 mg/dL (ref 0.61–1.24)
GFR calc Af Amer: >60 mL/min (ref >60)
GFR calc non Af Amer: >60 mL/min (ref >60)
Glucose, Bld: 111 mg/dL — ABNORMAL HIGH (ref 65–99)
Potassium: 3.5 mmol/L (ref 3.5–5.1)
Sodium: 136 mmol/L (ref 135–145)

## 2015-08-21 LAB — IRON AND TIBC
Iron: 16 ug/dL — ABNORMAL LOW (ref 45–182)
Saturation Ratios: 4 % — ABNORMAL LOW (ref 17.9–39.5)
TIBC: 395 ug/dL (ref 250–450)
UIBC: 379 ug/dL

## 2015-08-21 LAB — FERRITIN: Ferritin: 44 ng/mL (ref 24–336)

## 2015-08-21 NOTE — Progress Notes (Signed)
[facility] day:  08/21/2015  Chief Complaint: Anthony Melendez is a 36 y.o. male with metastatic gastric cancer who is seen for assessment on day 8 s/p cycle #1 FOLFOX chemotherapy.  HPI:  The patient was last seen on 08/14/2015.  At that time, he began cycle #1 FOLFOX chemotherapy.  Symptomatically, he has done well.  He states at first, treatment  was difficult. He felt a little dizzy. He has had no neuropathy or cold neuropathy. He denied any diarrhea.  Diet was initially poor, but is now good. He has lost weight.  He denies any shortness of breath.   Past Medical History  Diagnosis Date  . GERD (gastroesophageal reflux disease)     Past Surgical History  Procedure Laterality Date  . No past surgeries    . Esophagogastroduodenoscopy (egd) with propofol N/A 08/02/2015    Procedure: ESOPHAGOGASTRODUODENOSCOPY (EGD) WITH PROPOFOL;  Surgeon: Lucilla Lame, MD;  Location: [facility];  Service: Endoscopy;  Laterality: N/A;  . Peripheral vascular catheterization N/A 08/13/2015    Procedure: Porta Cath Insertion;  Surgeon: Algernon Huxley, MD;  Location: [facility];  Service: Cardiovascular;  Laterality: N/A;    History reviewed. No pertinent family history.  Social History:  reports that he has never smoked. He does not have any smokeless tobacco history on file. He reports that he does not drink alcohol or use illicit drugs.  The patient is from [place].  He speaks [place].  He moves to the [place] in 2000.  He lives in [place] with his father, mother, brother and sister.  He is a Theme park manager.  The patient is accompanied by his mom and sister, Jamesetta So, today.  The interpreter is present.  Allergies: No Known Allergies  Current Medications: Current Outpatient Prescriptions  Medication Sig Dispense Refill  . amoxicillin (AMOXIL) 500 MG tablet Take 2 tablets (1000 mg) by mouth twice a day for 14 days 56 tablet 0  .  clarithromycin (BIAXIN) 500 MG tablet Take 1 tablet (500 mg total) by mouth 2 (two) times daily. 28 tablet 0  . omeprazole (PRILOSEC) 20 MG capsule Take 1 capsule (20 mg total) by mouth 2 (two) times daily. 30 capsule 1  . potassium chloride (K-DUR) 10 MEQ tablet Take 1 daily or as directed by dr Mike Gip 30 tablet 1  . prochlorperazine (COMPAZINE) 10 MG tablet Take 1 tablet (10 mg total) by mouth every 6 (six) hours as needed for nausea or vomiting. 30 tablet 1   No current facility-administered medications for this visit.    Review of Systems:  GENERAL:  Feels good today.  No fevers or sweats.  Weight down 6 pounds since last visit. PERFORMANCE STATUS (ECOG):  1 HEENT:  No visual changes, runny nose, sore throat, mouth sores or tenderness. Lungs: No shortness of breath or cough.  No hemoptysis. Cardiac:  No chest pain, palpitations, orthopnea, or PND. GI:   Abdominal pain, yesterday.  No nausea or vomiting.  Beginning to eat better.  No diarrhea, constipation, melena or hematochezia. GU:  No urgency, frequency, dysuria, or hematuria. Musculoskeletal:  Back pain yesterday.  No joint pain.  No muscle tenderness. Extremities:  No pain or swelling. Skin:  No rashes or skin changes. Neuro:  No headache, numbness or weakness, balance or coordination issues. Endocrine:  No diabetes, thyroid issues, hot flashes or night sweats. Psych:  No mood changes, depression or anxiety. Pain:  No focal pain. Review of systems:  All other systems reviewed and found to be negative.  Physical Exam: Blood pressure 128/90, pulse 70, temperature 97.9 F (36.6 C), temperature source Tympanic, resp. rate 18, weight 176 lb 9.4 oz (80.1 kg). GENERAL:  Well developed, well nourished, gentleman sitting comfortably in the exam room in no acute distress. MENTAL STATUS:  Alert and oriented to person, place and time. HEAD:  Wearing a cap.  Black hair.  Mustache.  Normocephalic, atraumatic, face symmetric, no Cushingoid  features. EYES:  Brown eyes.  No conjunctivitis or scleral icterus. ENT:  Oropharynx clear without lesion.  Tongue normal. Mucous membranes moist.  RESPIRATORY:  Clear to auscultation without rales, wheezes or rhonchi. CARDIOVASCULAR:  Regular rate and rhythm without murmur, rub or gallop. ABDOMEN:  Soft, non-tender, with active bowel sounds, and no hepatosplenomegaly.  No masses. SKIN:  No rashes, ulcers or lesions. EXTREMITIES: No edema, no skin discoloration or tenderness.  No palpable cords. LYMPH NODES: No palpable cervical, supraclavicular, axillary or inguinal adenopathy  NEUROLOGICAL: Unremarkable. PSYCH:  Appropriate.  NEUROLOGICAL: Appropriate.   Appointment on 08/21/2015  Component Date Value Ref Range Status  . WBC 08/21/2015 4.3  3.8 - 10.6 K/uL Final  . RBC 08/21/2015 5.04  4.40 - 5.90 MIL/uL Final  . Hemoglobin 08/21/2015 13.7  13.0 - 18.0 g/dL Final  . HCT 08/21/2015 39.7* 40.0 - 52.0 % Final  . MCV 08/21/2015 78.9* 80.0 - 100.0 fL Final  . MCH 08/21/2015 27.1  26.0 - 34.0 pg Final  . MCHC 08/21/2015 34.4  32.0 - 36.0 g/dL Final  . RDW 08/21/2015 12.5  11.5 - 14.5 % Final  . Platelets 08/21/2015 258  150 - 440 K/uL Final  . Neutrophils Relative % 08/21/2015 64   Final  . Neutro Abs 08/21/2015 2.8  1.4 - 6.5 K/uL Final  . Lymphocytes Relative 08/21/2015 28   Final  . Lymphs Abs 08/21/2015 1.2  1.0 - 3.6 K/uL Final  . Monocytes Relative 08/21/2015 5   Final  . Monocytes Absolute 08/21/2015 0.2  0.2 - 1.0 K/uL Final  . Eosinophils Relative 08/21/2015 4   Final  . Eosinophils Absolute 08/21/2015 0.2  0 - 0.7 K/uL Final  . Basophils Relative 08/21/2015 1   Final  . Basophils Absolute 08/21/2015 0.0  0 - 0.1 K/uL Final  . Sodium 08/21/2015 136  135 - 145 mmol/L Final  . Potassium 08/21/2015 3.5  3.5 - 5.1 mmol/L Final  . Chloride 08/21/2015 103  101 - 111 mmol/L Final  . CO2 08/21/2015 28  22 - 32 mmol/L Final  . Glucose, Bld 08/21/2015 111* 65 - 99 mg/dL Final  .  BUN 08/21/2015 10  6 - 20 mg/dL Final  . Creatinine, Ser 08/21/2015 0.82  0.61 - 1.24 mg/dL Final  . Calcium 08/21/2015 8.7* 8.9 - 10.3 mg/dL Final  . GFR calc non Af Amer 08/21/2015 >60  >60 mL/min Final  . GFR calc Af Amer 08/21/2015 >60  >60 mL/min Final   Comment: (NOTE) The eGFR has been calculated using the CKD EPI equation. This calculation has not been validated in all clinical situations. eGFR's persistently <60 mL/min signify possible Chronic Kidney Disease.   . Anion gap 08/21/2015 5  5 - 15 Final    Assessment:  Anthony Melendez is a 35 y.o. male with metastatic gastric cancer.  He presented with a 4-6 week history of progressive epigastric pain.  EGD on 08/02/2015 revealed a large, ulcerated, non-circumferential mass with oozing and stigmata of recent bleeding in the entire  examined stomach.  The mass involved the fundus down to the antrum.  Biopsies revealed poorly differentiated adenocarcinoma.  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  Chest, abdomen and pelvic CT scan on 07/29/2015 revealed a 4 cm thickened and irregular gastric wall compatible with an infiltrative malignancy. There was extensive involvement of the upper mesentery and omentum compatible with peritoneal carcinomatosis. There was a small amount of ascites.   Head CT on 08/08/2015 revealed no evidence of metastatic disease.  He began treatment for H pylori on 08/09/2015.  RBC are microcytic likely due to iron deficiency anemia.  He is currently day 8 s/p cycle 31 FOLFOX chemotherapy (08/14/2015).  He tolerated his chemotherapy well.  Symptomatically, he has intermittent abdominal pain.  Weight is down 6 pounds.  He is eating better.  Plan: 1.  Labs today:  CBC with diff, BMP, ferritin, iron studies. 2.  Discuss side effects of chemotherapy.  Discuss caloric intake. 3.  Follow-up genetic testing with [facility] gastric panel once kit is available [phone]). 4.  Anticipate stool  testing 6 weeks after completion of antibiotics (and off omeprazole). 5.  Follow-up Her2/neu and MSI testing. 6.  Rx:  Compazine and potassium chloride. 7.  RTC in 1 weeks for MD assessment, labs (CBC with diff, CMP, Mg), and cycle #2 FOLFOX chemotherapy.   Lequita Asal, MD  08/21/2015

## 2015-08-21 NOTE — Progress Notes (Signed)
Patient is here today for follow up, no pain today but had pain yesterday in his abdomen. He is with his mom and sister. He notes slight SOB but its due to his back pain and abdomen pain.

## 2015-08-28 ENCOUNTER — Inpatient Hospital Stay (HOSPITAL_BASED_OUTPATIENT_CLINIC_OR_DEPARTMENT_OTHER): Payer: 59 | Admitting: Hematology and Oncology

## 2015-08-28 ENCOUNTER — Other Ambulatory Visit: Payer: Self-pay | Admitting: Hematology and Oncology

## 2015-08-28 ENCOUNTER — Encounter: Payer: Self-pay | Admitting: Pathology

## 2015-08-28 ENCOUNTER — Other Ambulatory Visit: Payer: Self-pay | Admitting: *Deleted

## 2015-08-28 ENCOUNTER — Inpatient Hospital Stay: Payer: 59

## 2015-08-28 VITALS — BP 132/80 | HR 57 | Temp 98.8°F | Resp 18 | Wt 178.5 lb

## 2015-08-28 DIAGNOSIS — K219 Gastro-esophageal reflux disease without esophagitis: Secondary | ICD-10-CM

## 2015-08-28 DIAGNOSIS — C169 Malignant neoplasm of stomach, unspecified: Secondary | ICD-10-CM

## 2015-08-28 DIAGNOSIS — R634 Abnormal weight loss: Secondary | ICD-10-CM

## 2015-08-28 DIAGNOSIS — E876 Hypokalemia: Secondary | ICD-10-CM | POA: Diagnosis not present

## 2015-08-28 DIAGNOSIS — R109 Unspecified abdominal pain: Secondary | ICD-10-CM

## 2015-08-28 DIAGNOSIS — D509 Iron deficiency anemia, unspecified: Secondary | ICD-10-CM

## 2015-08-28 DIAGNOSIS — C801 Malignant (primary) neoplasm, unspecified: Secondary | ICD-10-CM

## 2015-08-28 DIAGNOSIS — C168 Malignant neoplasm of overlapping sites of stomach: Secondary | ICD-10-CM

## 2015-08-28 DIAGNOSIS — C786 Secondary malignant neoplasm of retroperitoneum and peritoneum: Secondary | ICD-10-CM | POA: Diagnosis not present

## 2015-08-28 DIAGNOSIS — Z79899 Other long term (current) drug therapy: Secondary | ICD-10-CM

## 2015-08-28 DIAGNOSIS — R1032 Left lower quadrant pain: Secondary | ICD-10-CM

## 2015-08-28 DIAGNOSIS — K2970 Gastritis, unspecified, without bleeding: Secondary | ICD-10-CM

## 2015-08-28 DIAGNOSIS — B9681 Helicobacter pylori [H. pylori] as the cause of diseases classified elsewhere: Secondary | ICD-10-CM

## 2015-08-28 LAB — COMPREHENSIVE METABOLIC PANEL
ALT: 25 U/L (ref 17–63)
AST: 27 U/L (ref 15–41)
Albumin: 4.1 g/dL (ref 3.5–5.0)
Alkaline Phosphatase: 75 U/L (ref 38–126)
Anion gap: 6 (ref 5–15)
BUN: 11 mg/dL (ref 6–20)
CO2: 26 mmol/L (ref 22–32)
Calcium: 8.7 mg/dL — ABNORMAL LOW (ref 8.9–10.3)
Chloride: 106 mmol/L (ref 101–111)
Creatinine, Ser: 0.65 mg/dL (ref 0.61–1.24)
GFR calc Af Amer: >60 mL/min (ref >60)
GFR calc non Af Amer: >60 mL/min (ref >60)
Glucose, Bld: 105 mg/dL — ABNORMAL HIGH (ref 65–99)
Potassium: 3.4 mmol/L — ABNORMAL LOW (ref 3.5–5.1)
Sodium: 138 mmol/L (ref 135–145)
Total Bilirubin: 0.4 mg/dL (ref 0.3–1.2)
Total Protein: 7.2 g/dL (ref 6.5–8.1)

## 2015-08-28 LAB — CBC WITH DIFFERENTIAL/PLATELET
Basophils Absolute: 0 10*3/uL (ref 0–0.1)
Basophils Relative: 1 %
Eosinophils Absolute: 0.1 10*3/uL (ref 0–0.7)
Eosinophils Relative: 3 %
HCT: 38.4 % — ABNORMAL LOW (ref 40.0–52.0)
Hemoglobin: 13.4 g/dL (ref 13.0–18.0)
Lymphocytes Relative: 32 %
Lymphs Abs: 1.3 10*3/uL (ref 1.0–3.6)
MCH: 27.1 pg (ref 26.0–34.0)
MCHC: 34.8 g/dL (ref 32.0–36.0)
MCV: 77.8 fL — ABNORMAL LOW (ref 80.0–100.0)
Monocytes Absolute: 0.4 10*3/uL (ref 0.2–1.0)
Monocytes Relative: 11 %
Neutro Abs: 2.1 10*3/uL (ref 1.4–6.5)
Neutrophils Relative %: 53 %
Platelets: 184 10*3/uL (ref 150–440)
RBC: 4.94 MIL/uL (ref 4.40–5.90)
RDW: 12.4 % (ref 11.5–14.5)
WBC: 3.9 10*3/uL (ref 3.8–10.6)

## 2015-08-28 LAB — MAGNESIUM: Magnesium: 2.1 mg/dL (ref 1.7–2.4)

## 2015-08-28 MED ORDER — SODIUM CHLORIDE 0.9 % IV SOLN
10.0000 mg | Freq: Once | INTRAVENOUS | Status: AC
  Administered 2015-08-28: 10 mg via INTRAVENOUS
  Filled 2015-08-28: qty 1

## 2015-08-28 MED ORDER — FERROUS SULFATE 325 (65 FE) MG PO TBEC: 325.0000 mg | DELAYED_RELEASE_TABLET | Freq: Every day | ORAL | 3 refills | Status: DC

## 2015-08-28 MED ORDER — OMEPRAZOLE 20 MG PO CPDR: 20.0000 mg | DELAYED_RELEASE_CAPSULE | Freq: Every day | ORAL | 1 refills | Status: DC

## 2015-08-28 MED ORDER — SODIUM CHLORIDE 0.9 % IV SOLN
2400.0000 mg/m2 | INTRAVENOUS | Status: DC
  Administered 2015-08-28: 4700 mg via INTRAVENOUS
  Filled 2015-08-28: qty 94

## 2015-08-28 MED ORDER — OXALIPLATIN CHEMO INJECTION 100 MG/20ML
85.0000 mg/m2 | Freq: Once | INTRAVENOUS | Status: AC
  Administered 2015-08-28: 165 mg via INTRAVENOUS
  Filled 2015-08-28: qty 33

## 2015-08-28 MED ORDER — FLUOROURACIL CHEMO INJECTION 2.5 GM/50ML
400.0000 mg/m2 | Freq: Once | INTRAVENOUS | Status: AC
  Administered 2015-08-28: 800 mg via INTRAVENOUS
  Filled 2015-08-28: qty 16

## 2015-08-28 MED ORDER — DEXTROSE 5 % IV SOLN
800.0000 mg | Freq: Once | INTRAVENOUS | Status: AC
  Administered 2015-08-28: 800 mg via INTRAVENOUS
  Filled 2015-08-28: qty 25

## 2015-08-28 MED ORDER — PALONOSETRON HCL INJECTION 0.25 MG/5ML
0.2500 mg | Freq: Once | INTRAVENOUS | Status: AC
  Administered 2015-08-28: 0.25 mg via INTRAVENOUS
  Filled 2015-08-28: qty 5

## 2015-08-28 MED ORDER — SODIUM CHLORIDE 0.9% FLUSH
10.0000 mL | INTRAVENOUS | Status: AC | PRN
  Administered 2015-08-28: 10 mL via INTRAVENOUS
  Filled 2015-08-28: qty 10

## 2015-08-28 MED ORDER — DEXTROSE 5 % IV SOLN
Freq: Once | INTRAVENOUS | Status: AC
Start: 2015-08-28 — End: 2015-08-28
  Administered 2015-08-28: 10:00:00 via INTRAVENOUS
  Filled 2015-08-28: qty 1000

## 2015-08-28 NOTE — Progress Notes (Signed)
Patient is here today for follow up, he is not taking any of his medications. He noted some abdomen pain yesterday, he notes some trouble breathing when he has pain in his back near his shoulder blades but its not bad. He also mentions back pain when sitting long periods of time.   Patient is wanting to go on vacation for two weeks, is he cleared to travel and does he need to take his medications?

## 2015-08-28 NOTE — Progress Notes (Signed)
[facility]-  Cancer Center  Clinic day:  08/28/15  Chief Complaint: Anthony Melendez is a 35 y.o. male with metastatic gastric cancer who is seen for assessment prior to cycle #2 FOLFOX chemotherapy.  HPI:  The patient was last seen on 08/21/2015.  At that time, he was seen for nadir assessment on day 8 of cycle #1 FOLFOX chemotherapy.  Symptomatically, he was doing well.  He denied any mouth sore or tenderness.  He denied any diarrhea or neuropathy.  Labs revealed a hematocrit of 39.7 and 13.7, MCV 78.9, platelets 258,000, and white count 4300 with an ANC of 2800.   He underwent an anemia work-up.  Ferritin was 44 with an iron saturation 4% and a TIBC of 395.  During the interim, he has felt fine. He completed his antibiotics for Helicobacter pylori. He is not taking any oral potassium or anti-emetics. He has some left lower quadrant pain at times.   He notes that he wants to go to [facility] as his grandmother died.  There is a ceremony and family gathering scheduled for 09/13/2015.   Past Medical History:  Diagnosis Date  . GERD (gastroesophageal reflux disease)     Past Surgical History:  Procedure Laterality Date  . ESOPHAGOGASTRODUODENOSCOPY (EGD) WITH PROPOFOL N/A 08/02/2015   Procedure: ESOPHAGOGASTRODUODENOSCOPY (EGD) WITH PROPOFOL;  Surgeon: Midge Minium, MD;  Location: [facility];  Service: Endoscopy;  Laterality: N/A;  . NO PAST SURGERIES    . PERIPHERAL VASCULAR CATHETERIZATION N/A 08/13/2015   Procedure: Shelda Pal Cath Insertion;  Surgeon: Annice Needy, MD;  Location: [facility] INVASIVE CV LAB;  Service: Cardiovascular;  Laterality: N/A;    No family history on file.  Social History:  reports that he has never smoked. He does not have any smokeless tobacco history on file. He reports that he does not drink alcohol or use drugs.  The patient is from [place].  He speaks [place].  He moves to the [place] in 2000.  He lives in [place] with his father,  mother, brother and sister.  He is a Designer, fashion/clothing.  The patient is accompanied by his sister, Anthony Melendez, today.  The interpreter is present.  Allergies: No Known Allergies  Current Medications: Current Outpatient Prescriptions  Medication Sig Dispense Refill  . amoxicillin (AMOXIL) 500 MG tablet Take 2 tablets (1000 mg) by mouth twice a day for 14 days (Patient not taking: Reported on 08/28/2015) 56 tablet 0  . clarithromycin (BIAXIN) 500 MG tablet Take 1 tablet (500 mg total) by mouth 2 (two) times daily. (Patient not taking: Reported on 08/28/2015) 28 tablet 0  . ferrous sulfate 325 (65 FE) MG EC tablet Take 1 tablet (325 mg total) by mouth daily with breakfast. 30 tablet 3  . omeprazole (PRILOSEC) 20 MG capsule Take 1 capsule (20 mg total) by mouth daily. 30 capsule 1  . potassium chloride (K-DUR) 10 MEQ tablet Take 1 daily or as directed by dr Merlene Pulling (Patient not taking: Reported on 08/28/2015) 30 tablet 1  . prochlorperazine (COMPAZINE) 10 MG tablet Take 1 tablet (10 mg total) by mouth every 6 (six) hours as needed for nausea or vomiting. (Patient not taking: Reported on 08/28/2015) 30 tablet 1   No current facility-administered medications for this visit.    Facility-Administered Medications Ordered in Other Visits  Medication Dose Route Frequency Provider Last Rate Last Dose  . sodium chloride flush (NS) 0.9 % injection 10 mL  10 mL Intravenous PRN Rosey Bath, MD   10  mL at 08/28/15 6295    Review of Systems:  GENERAL:  Feels fine.  No fevers or sweats.  Weight up 2 pounds since last visit. PERFORMANCE STATUS (ECOG):  1 HEENT:  No visual changes, runny nose, sore throat, mouth sores or tenderness. Lungs: No shortness of breath or cough.  No hemoptysis. Cardiac:  No chest pain, palpitations, orthopnea, or PND. GI:   Intermittent LLQ pain.  No nausea, vomiting, diarrhea, constipation, melena or hematochezia. GU:  No urgency, frequency, dysuria, or hematuria. Musculoskeletal:  Back  pain.  No joint pain.  No muscle tenderness. Extremities:  No pain or swelling. Skin:  No rashes or skin changes. Neuro:  Feels weak at times.  No headache, numbness or weakness, balance or coordination issues. Endocrine:  No diabetes, thyroid issues, hot flashes or night sweats. Psych:  No mood changes, depression or anxiety. Pain:  No focal pain. Review of systems:  All other systems reviewed and found to be negative.  Physical Exam: Blood pressure 132/80, pulse (!) 57, temperature 98.8 F (37.1 C), temperature source Tympanic, resp. rate 18, weight 178 lb 7.4 oz (81 kg). GENERAL:  Well developed, well nourished, gentleman sitting comfortably in the exam room in no acute distress. MENTAL STATUS:  Alert and oriented to person, place and time. HEAD:  Wearing a cap.  Black hair.  Thin mustache.  Normocephalic, atraumatic, face symmetric, no Cushingoid features. EYES:  Brown eyes.  No conjunctivitis or scleral icterus. ENT:  Oropharynx clear without lesion.  Tongue normal. Mucous membranes moist.  RESPIRATORY:  Clear to auscultation without rales, wheezes or rhonchi. CARDIOVASCULAR:  Regular rate and rhythm without murmur, rub or gallop. ABDOMEN:  Soft, non-tender, with active bowel sounds, and no hepatosplenomegaly.  No guarding or rebound tenderness.  No masses. SKIN:  No rashes, ulcers or lesions. EXTREMITIES: No edema, no skin discoloration or tenderness.  No palpable cords. LYMPH NODES: No palpable cervical, supraclavicular, axillary or inguinal adenopathy  NEUROLOGICAL: Unremarkable. PSYCH:  Appropriate.  NEUROLOGICAL: Appropriate.   Clinical Support on 08/28/2015  Component Date Value Ref Range Status  . Magnesium 08/28/2015 2.1  1.7 - 2.4 mg/dL Final  . WBC 08/28/2015 3.9  3.8 - 10.6 K/uL Final  . RBC 08/28/2015 4.94  4.40 - 5.90 MIL/uL Final  . Hemoglobin 08/28/2015 13.4  13.0 - 18.0 g/dL Final  . HCT 08/28/2015 38.4* 40.0 - 52.0 % Final  . MCV 08/28/2015 77.8* 80.0 - 100.0  fL Final  . MCH 08/28/2015 27.1  26.0 - 34.0 pg Final  . MCHC 08/28/2015 34.8  32.0 - 36.0 g/dL Final  . RDW 08/28/2015 12.4  11.5 - 14.5 % Final  . Platelets 08/28/2015 184  150 - 440 K/uL Final  . Neutrophils Relative % 08/28/2015 53  % Final  . Neutro Abs 08/28/2015 2.1  1.4 - 6.5 K/uL Final  . Lymphocytes Relative 08/28/2015 32  % Final  . Lymphs Abs 08/28/2015 1.3  1.0 - 3.6 K/uL Final  . Monocytes Relative 08/28/2015 11  % Final  . Monocytes Absolute 08/28/2015 0.4  0.2 - 1.0 K/uL Final  . Eosinophils Relative 08/28/2015 3  % Final  . Eosinophils Absolute 08/28/2015 0.1  0 - 0.7 K/uL Final  . Basophils Relative 08/28/2015 1  % Final  . Basophils Absolute 08/28/2015 0.0  0 - 0.1 K/uL Final  . Sodium 08/28/2015 138  135 - 145 mmol/L Final  . Potassium 08/28/2015 3.4* 3.5 - 5.1 mmol/L Final  . Chloride 08/28/2015 106  101 -  111 mmol/L Final  . CO2 08/28/2015 26  22 - 32 mmol/L Final  . Glucose, Bld 08/28/2015 105* 65 - 99 mg/dL Final  . BUN 08/28/2015 11  6 - 20 mg/dL Final  . Creatinine, Ser 08/28/2015 0.65  0.61 - 1.24 mg/dL Final  . Calcium 08/28/2015 8.7* 8.9 - 10.3 mg/dL Final  . Total Protein 08/28/2015 7.2  6.5 - 8.1 g/dL Final  . Albumin 08/28/2015 4.1  3.5 - 5.0 g/dL Final  . AST 08/28/2015 27  15 - 41 U/L Final  . ALT 08/28/2015 25  17 - 63 U/L Final  . Alkaline Phosphatase 08/28/2015 75  38 - 126 U/L Final  . Total Bilirubin 08/28/2015 0.4  0.3 - 1.2 mg/dL Final  . GFR calc non Af Amer 08/28/2015 >60  >60 mL/min Final  . GFR calc Af Amer 08/28/2015 >60  >60 mL/min Final   Comment: (NOTE) The eGFR has been calculated using the CKD EPI equation. This calculation has not been validated in all clinical situations. eGFR's persistently <60 mL/min signify possible Chronic Kidney Disease.   Georgiann Hahn gap 08/28/2015 6  5 - 15 Final    Assessment:  Westyn Driggers is a 35 y.o. male with metastatic gastric cancer.  He presented with a 4-6 week history of progressive  epigastric pain.  EGD on 08/02/2015 revealed a large, ulcerated, non-circumferential mass with oozing and stigmata of recent bleeding in the entire examined stomach.  The mass involved the fundus down to the antrum.  Biopsies revealed poorly differentiated adenocarcinoma.  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  Her2/neu testing was negative.  Microsatellite testing was negative.  Chest, abdomen and pelvic CT scan on 07/29/2015 revealed a 4 cm thickened and irregular gastric wall compatible with an infiltrative malignancy. There was extensive involvement of the upper mesentery and omentum compatible with peritoneal carcinomatosis. There was a small amount of ascites.   Head CT on 08/08/2015 revealed no evidence of metastatic disease.  He completed treatment for H pylori  (began 08/09/2015).  He has iron deficiency anemia likely gastric oozing and modest diet.  RBC are microcytic.  Labs on 08/21/2015 revealed a ferritin was 44 with an iron saturation 4% and a TIBC of 395.  He is s/p cycle #1 FOLFOX chemotherapy (08/14/2015).  He tolerated his chemotherapy well without nausea, vomiting, diarrhea, mouth sores, or neuropathy.  Symptomatically, he has intermittent abdominal pain.  He has lost 30 pounds in the past month.  Weight is stable.  He has hypokalemia (3.4) and hypocalcemia (8.7).  Plan: 1.  Labs today:  CBC with diff, CMP, Mg. 2.  Discuss iron deficiency anemia and rich foods.  Patient may require IV iron. 3.  Follow-up MSI and Her2/neu testing-done.  Consider Foundation Once testing. 4.  Patient to take potassium daily.  Discuss calcium supplementation. 5.  Cycle #2 FOLFOX today 6.  RTC in 2 days for disconnect 7.  RTC in 1 week for labs (CBC with diff, BMP) 8.  Discuss patient's need to postpone next cycle secondary to death of grandmother.  Treatment delay is 1 week. 9.  RTC on 09/18/2015 for MD assessment, labs (CBC with diff, CMP, ferritin) and cycle #3  FOLFOX chemotherapy.   Lequita Asal, MD  08/28/2015, 9:27 AM

## 2015-08-29 ENCOUNTER — Ambulatory Visit: Payer: Self-pay | Admitting: Gastroenterology

## 2015-08-30 ENCOUNTER — Inpatient Hospital Stay: Payer: 59

## 2015-08-30 VITALS — BP 110/70 | Temp 97.9°F

## 2015-08-30 DIAGNOSIS — C801 Malignant (primary) neoplasm, unspecified: Secondary | ICD-10-CM

## 2015-08-30 MED ORDER — HEPARIN SOD (PORK) LOCK FLUSH 100 UNIT/ML IV SOLN
500.0000 [IU] | Freq: Once | INTRAVENOUS | Status: AC
  Administered 2015-08-30: 500 [IU] via INTRAVENOUS
  Filled 2015-08-30: qty 5

## 2015-08-30 MED ORDER — SODIUM CHLORIDE 0.9% FLUSH
10.0000 mL | INTRAVENOUS | Status: DC | PRN
  Administered 2015-08-30: 10 mL via INTRAVENOUS
  Filled 2015-08-30: qty 10

## 2015-09-02 ENCOUNTER — Encounter: Payer: Self-pay | Admitting: Hematology and Oncology

## 2015-09-04 ENCOUNTER — Inpatient Hospital Stay: Payer: 59 | Attending: Hematology and Oncology

## 2015-09-04 ENCOUNTER — Encounter (INDEPENDENT_AMBULATORY_CARE_PROVIDER_SITE_OTHER): Payer: Self-pay

## 2015-09-04 ENCOUNTER — Telehealth: Payer: Self-pay | Admitting: *Deleted

## 2015-09-04 ENCOUNTER — Other Ambulatory Visit: Payer: Self-pay

## 2015-09-04 DIAGNOSIS — C169 Malignant neoplasm of stomach, unspecified: Secondary | ICD-10-CM

## 2015-09-04 DIAGNOSIS — Z79899 Other long term (current) drug therapy: Secondary | ICD-10-CM | POA: Insufficient documentation

## 2015-09-04 DIAGNOSIS — E876 Hypokalemia: Secondary | ICD-10-CM

## 2015-09-04 DIAGNOSIS — J029 Acute pharyngitis, unspecified: Secondary | ICD-10-CM | POA: Insufficient documentation

## 2015-09-04 DIAGNOSIS — B9681 Helicobacter pylori [H. pylori] as the cause of diseases classified elsewhere: Secondary | ICD-10-CM | POA: Insufficient documentation

## 2015-09-04 DIAGNOSIS — D509 Iron deficiency anemia, unspecified: Secondary | ICD-10-CM

## 2015-09-04 DIAGNOSIS — C168 Malignant neoplasm of overlapping sites of stomach: Secondary | ICD-10-CM | POA: Insufficient documentation

## 2015-09-04 DIAGNOSIS — Z5111 Encounter for antineoplastic chemotherapy: Secondary | ICD-10-CM | POA: Insufficient documentation

## 2015-09-04 DIAGNOSIS — K5900 Constipation, unspecified: Secondary | ICD-10-CM | POA: Insufficient documentation

## 2015-09-04 DIAGNOSIS — K2970 Gastritis, unspecified, without bleeding: Secondary | ICD-10-CM | POA: Insufficient documentation

## 2015-09-04 DIAGNOSIS — C786 Secondary malignant neoplasm of retroperitoneum and peritoneum: Secondary | ICD-10-CM | POA: Insufficient documentation

## 2015-09-04 DIAGNOSIS — K219 Gastro-esophageal reflux disease without esophagitis: Secondary | ICD-10-CM | POA: Insufficient documentation

## 2015-09-04 DIAGNOSIS — R718 Other abnormality of red blood cells: Secondary | ICD-10-CM

## 2015-09-04 LAB — CBC WITH DIFFERENTIAL/PLATELET
Basophils Absolute: 0 10*3/uL (ref 0–0.1)
Basophils Relative: 1 %
Eosinophils Absolute: 0.1 10*3/uL (ref 0–0.7)
Eosinophils Relative: 3 %
HCT: 42.6 % (ref 40.0–52.0)
Hemoglobin: 14.7 g/dL (ref 13.0–18.0)
Lymphocytes Relative: 46 %
Lymphs Abs: 1.4 10*3/uL (ref 1.0–3.6)
MCH: 26.6 pg (ref 26.0–34.0)
MCHC: 34.4 g/dL (ref 32.0–36.0)
MCV: 77.2 fL — ABNORMAL LOW (ref 80.0–100.0)
Monocytes Absolute: 0.4 10*3/uL (ref 0.2–1.0)
Monocytes Relative: 14 %
Neutro Abs: 1.1 10*3/uL — ABNORMAL LOW (ref 1.4–6.5)
Neutrophils Relative %: 36 %
Platelets: 190 10*3/uL (ref 150–440)
RBC: 5.51 MIL/uL (ref 4.40–5.90)
RDW: 12.9 % (ref 11.5–14.5)
WBC: 3 10*3/uL — ABNORMAL LOW (ref 3.8–10.6)

## 2015-09-04 LAB — BASIC METABOLIC PANEL
Anion gap: 7 (ref 5–15)
BUN: 10 mg/dL (ref 6–20)
CO2: 28 mmol/L (ref 22–32)
Calcium: 9.1 mg/dL (ref 8.9–10.3)
Chloride: 101 mmol/L (ref 101–111)
Creatinine, Ser: 0.8 mg/dL (ref 0.61–1.24)
GFR calc Af Amer: >60 mL/min (ref >60)
GFR calc non Af Amer: >60 mL/min (ref >60)
Glucose, Bld: 101 mg/dL — ABNORMAL HIGH (ref 65–99)
Potassium: 4 mmol/L (ref 3.5–5.1)
Sodium: 136 mmol/L (ref 135–145)

## 2015-09-04 NOTE — Telephone Encounter (Signed)
Pt thought he was suppose to see md today but when I looked in computer the orders are fo lab only. The pt is not having any problems.  And he had made appt for 8/15 because he had to go out of town and now he is not so he would like to make his original appt back to 8/8.  I checked with Mike Gip and she asys lab only, and if there is room for treatment 8/8 she is fine with it.  I have checked with Lattie Haw and it can be done and appt changed from 8/15 to 8/8. Pt has new appt sch.

## 2015-09-10 ENCOUNTER — Other Ambulatory Visit: Payer: Self-pay | Admitting: Hematology and Oncology

## 2015-09-10 DIAGNOSIS — C168 Malignant neoplasm of overlapping sites of stomach: Secondary | ICD-10-CM

## 2015-09-10 NOTE — Progress Notes (Signed)
[facility] day:  09/11/15  Chief Complaint: Belvin Gauss is a 35 y.o. male with metastatic gastric cancer who is seen for assessment prior to cycle #3 FOLFOX chemotherapy.  HPI:  The patient was last seen on 08/28/2015.  At that time, he received cycle #2 FOLFOX chemotherapy.  He noted intermittent abdominal pain.  Weight was stable.  He had hypokalemia (3.4) and hypocalcemia (8.7).  We discussed potassium and calcium supplementation.  Labs on 09/04/2015 revealed a hematocrit of 42.6, hemoglobin 14.7, MCV 77.2, platelets 190,000, WBC 3000 with an ANC of 1100.  Potassium was 4.0.  During the interim, he had brief stomach cramps on 2 days.  Pain resolved spontaneously.  He did not require pain medications.  He denies any diarrhea.  He has had some constipation.  He denies any neuropathy.   Past Medical History:  Diagnosis Date  . GERD (gastroesophageal reflux disease)     Past Surgical History:  Procedure Laterality Date  . ESOPHAGOGASTRODUODENOSCOPY (EGD) WITH PROPOFOL N/A 08/02/2015   Procedure: ESOPHAGOGASTRODUODENOSCOPY (EGD) WITH PROPOFOL;  Surgeon: Lucilla Lame, MD;  Location: [place];  Service: Endoscopy;  Laterality: N/A;  . NO PAST SURGERIES    . PERIPHERAL VASCULAR CATHETERIZATION N/A 08/13/2015   Procedure: Glori Luis Cath Insertion;  Surgeon: Algernon Huxley, MD;  Location: [place] CV [facility];  Service: Cardiovascular;  Laterality: N/A;    No family history on file.  Social History:  reports that he has never smoked. He does not have any smokeless tobacco history on file. He reports that he does not drink alcohol or use drugs.  The patient is from [place].  He speaks [place].  He moves to the [place] in 2000.  He lives in [place] with his father, mother, brother and sister.  He is a Theme park manager.  The patient is accompanied by his mother, sister, Jamesetta So, today.  The interpreter is present.  Allergies: No Known  Allergies  Current Medications: Current Outpatient Prescriptions  Medication Sig Dispense Refill  . ferrous sulfate 325 (65 FE) MG EC tablet Take 1 tablet (325 mg total) by mouth daily with breakfast. 30 tablet 3  . omeprazole (PRILOSEC) 20 MG capsule Take 1 capsule (20 mg total) by mouth daily. 30 capsule 1  . potassium chloride (K-DUR) 10 MEQ tablet Take 1 daily or as directed by dr Mike Gip 30 tablet 1  . prochlorperazine (COMPAZINE) 10 MG tablet Take 1 tablet (10 mg total) by mouth every 6 (six) hours as needed for nausea or vomiting. 30 tablet 1   No current facility-administered medications for this visit.    Facility-Administered Medications Ordered in Other Visits  Medication Dose Route Frequency Provider Last Rate Last Dose  . sodium chloride flush (NS) 0.9 % injection 10 mL  10 mL Intravenous PRN Lequita Asal, MD   10 mL at 08/28/15 6295    Review of Systems:  GENERAL:  Feels good.  No fevers or sweats.  Weight stable. PERFORMANCE STATUS (ECOG):  1 HEENT:  No visual changes, runny nose, sore throat, mouth sores or tenderness. Lungs: No shortness of breath or cough.  No hemoptysis. Cardiac:  No chest pain, palpitations, orthopnea, or PND. GI:   Brief nausea and abdominal cramps last week x 2.  No vomiting, diarrhea, constipation, melena or hematochezia. GU:  No urgency, frequency, dysuria, or hematuria. Musculoskeletal:  No back or joint pain.  No muscle tenderness. Extremities:  No pain or swelling. Skin:  No rashes  or skin changes. Neuro:  No headache, numbness or weakness, balance or coordination issues. Endocrine:  No diabetes, thyroid issues, hot flashes or night sweats. Psych:  No mood changes, depression or anxiety. Pain:  No focal pain. Review of systems:  All other systems reviewed and found to be negative.  Physical Exam: Blood pressure 127/87, pulse 69, temperature 97.4 F (36.3 C), temperature source Tympanic, resp. rate 18, weight 178 lb 3.9 oz (80.8  kg). GENERAL:  Well developed, well nourished, gentleman sitting comfortably in the exam room in no acute distress. MENTAL STATUS:  Alert and oriented to person, place and time. HEAD:  Wearing a cap.  Black hair.  Thin mustache.  Normocephalic, atraumatic, face symmetric, no Cushingoid features. EYES:  Brown eyes.  No conjunctivitis or scleral icterus. ENT:  Oropharynx clear without lesion.  Tongue normal. Mucous membranes moist.  RESPIRATORY:  Clear to auscultation without rales, wheezes or rhonchi. CARDIOVASCULAR:  Regular rate and rhythm without murmur, rub or gallop. ABDOMEN:  Soft, non-tender, with active bowel sounds, and no hepatosplenomegaly.  No guarding or rebound tenderness.  No masses. SKIN:  No rashes, ulcers or lesions. EXTREMITIES: No edema, no skin discoloration or tenderness.  No palpable cords. LYMPH NODES: No palpable cervical, supraclavicular, axillary or inguinal adenopathy  NEUROLOGICAL: Unremarkable. PSYCH:  Appropriate.  NEUROLOGICAL: Appropriate.   Appointment on 09/11/2015  Component Date Value Ref Range Status  . Magnesium 09/11/2015 2.0  1.7 - 2.4 mg/dL Final  . WBC 09/11/2015 3.7* 3.8 - 10.6 K/uL Final  . RBC 09/11/2015 5.21  4.40 - 5.90 MIL/uL Final  . Hemoglobin 09/11/2015 13.8  13.0 - 18.0 g/dL Final  . HCT 09/11/2015 40.3  40.0 - 52.0 % Final  . MCV 09/11/2015 77.3* 80.0 - 100.0 fL Final  . MCH 09/11/2015 26.4  26.0 - 34.0 pg Final  . MCHC 09/11/2015 34.1  32.0 - 36.0 g/dL Final  . RDW 09/11/2015 13.4  11.5 - 14.5 % Final  . Platelets 09/11/2015 156  150 - 440 K/uL Final  . Neutrophils Relative % 09/11/2015 41  % Final  . Neutro Abs 09/11/2015 1.5  1.4 - 6.5 K/uL Final  . Lymphocytes Relative 09/11/2015 45  % Final  . Lymphs Abs 09/11/2015 1.7  1.0 - 3.6 K/uL Final  . Monocytes Relative 09/11/2015 11  % Final  . Monocytes Absolute 09/11/2015 0.4  0.2 - 1.0 K/uL Final  . Eosinophils Relative 09/11/2015 2  % Final  . Eosinophils Absolute 09/11/2015  0.1  0 - 0.7 K/uL Final  . Basophils Relative 09/11/2015 1  % Final  . Basophils Absolute 09/11/2015 0.0  0 - 0.1 K/uL Final  . Sodium 09/11/2015 137  135 - 145 mmol/L Final  . Potassium 09/11/2015 3.6  3.5 - 5.1 mmol/L Final  . Chloride 09/11/2015 105  101 - 111 mmol/L Final  . CO2 09/11/2015 25  22 - 32 mmol/L Final  . Glucose, Bld 09/11/2015 120* 65 - 99 mg/dL Final  . BUN 09/11/2015 11  6 - 20 mg/dL Final  . Creatinine, Ser 09/11/2015 0.74  0.61 - 1.24 mg/dL Final  . Calcium 09/11/2015 8.6* 8.9 - 10.3 mg/dL Final  . Total Protein 09/11/2015 7.1  6.5 - 8.1 g/dL Final  . Albumin 09/11/2015 4.1  3.5 - 5.0 g/dL Final  . AST 09/11/2015 30  15 - 41 U/L Final  . ALT 09/11/2015 21  17 - 63 U/L Final  . Alkaline Phosphatase 09/11/2015 86  38 - 126 U/L Final  .  Total Bilirubin 09/11/2015 0.4  0.3 - 1.2 mg/dL Final  . GFR calc non Af Amer 09/11/2015 >60  >60 mL/min Final  . GFR calc Af Amer 09/11/2015 >60  >60 mL/min Final   Comment: (NOTE) The eGFR has been calculated using the CKD EPI equation. This calculation has not been validated in all clinical situations. eGFR's persistently <60 mL/min signify possible Chronic Kidney Disease.   . Anion gap 09/11/2015 7  5 - 15 Final    Assessment:  Ray Gervasi is a 35 y.o. male with metastatic gastric cancer.  He presented with a 4-6 week history of progressive epigastric pain.  EGD on 08/02/2015 revealed a large, ulcerated, non-circumferential mass with oozing and stigmata of recent bleeding in the entire examined stomach.  The mass involved the fundus down to the antrum.  Biopsies revealed poorly differentiated adenocarcinoma.  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  Her2/neu testing was negative.  Microsatellite testing was negative.  Chest, abdomen and pelvic CT scan on 07/29/2015 revealed a 4 cm thickened and irregular gastric wall compatible with an infiltrative malignancy. There was extensive involvement of  the upper mesentery and omentum compatible with peritoneal carcinomatosis. There was a small amount of ascites.   Head CT on 08/08/2015 revealed no evidence of metastatic disease.  He completed treatment for H pylori  (began 08/09/2015).  He has iron deficiency anemia likely gastric oozing and modest diet.  RBC are microcytic.  Labs on 08/21/2015 revealed a ferritin was 44 with an iron saturation 4% and a TIBC of 395.  He is s/p 2 cycles of FOLFOX chemotherapy (08/14/2015 - 08/28/2015).  He tolerated his chemotherapy well without diarrhea, mouth sores, or neuropathy.  Symptomatically, he has intermittent abdominal cramping.  Weight is stable.  Potassium is normal.  He has mild hypocalcemia (8.6).  Plan: 1.  Labs today:  CBC with diff, CMP, ferritin. 2.  Cycle #3 FOLFOX chemotherapy. 3.  RTC in 2 days for disconnect. 4.  Discuss calcium and iron supplementation. 5.  Discuss nadir blood counts.  Discuss possible addition of Neulasta if becomes neutropenic. 6.  RTC in 1 week for CBC with diff 7.  RTC in 2 weeks for MD assessment, labs (CBC with diff, CMP, Mg) and cycle #4 FOLFOX.   Lequita Asal, MD  09/11/2015, 10:34 AM

## 2015-09-11 ENCOUNTER — Inpatient Hospital Stay (HOSPITAL_BASED_OUTPATIENT_CLINIC_OR_DEPARTMENT_OTHER): Payer: 59 | Admitting: Hematology and Oncology

## 2015-09-11 ENCOUNTER — Other Ambulatory Visit: Payer: Self-pay | Admitting: *Deleted

## 2015-09-11 ENCOUNTER — Inpatient Hospital Stay: Payer: 59

## 2015-09-11 VITALS — BP 124/80 | HR 66 | Temp 97.3°F | Resp 18

## 2015-09-11 VITALS — BP 127/87 | HR 69 | Temp 97.4°F | Resp 18 | Wt 178.2 lb

## 2015-09-11 DIAGNOSIS — C168 Malignant neoplasm of overlapping sites of stomach: Secondary | ICD-10-CM

## 2015-09-11 DIAGNOSIS — K2970 Gastritis, unspecified, without bleeding: Secondary | ICD-10-CM

## 2015-09-11 DIAGNOSIS — Z79899 Other long term (current) drug therapy: Secondary | ICD-10-CM

## 2015-09-11 DIAGNOSIS — R718 Other abnormality of red blood cells: Secondary | ICD-10-CM

## 2015-09-11 DIAGNOSIS — C801 Malignant (primary) neoplasm, unspecified: Secondary | ICD-10-CM

## 2015-09-11 DIAGNOSIS — K219 Gastro-esophageal reflux disease without esophagitis: Secondary | ICD-10-CM

## 2015-09-11 DIAGNOSIS — D509 Iron deficiency anemia, unspecified: Secondary | ICD-10-CM

## 2015-09-11 DIAGNOSIS — C786 Secondary malignant neoplasm of retroperitoneum and peritoneum: Secondary | ICD-10-CM

## 2015-09-11 DIAGNOSIS — B9681 Helicobacter pylori [H. pylori] as the cause of diseases classified elsewhere: Secondary | ICD-10-CM

## 2015-09-11 DIAGNOSIS — K5900 Constipation, unspecified: Secondary | ICD-10-CM

## 2015-09-11 DIAGNOSIS — E876 Hypokalemia: Secondary | ICD-10-CM

## 2015-09-11 DIAGNOSIS — C169 Malignant neoplasm of stomach, unspecified: Secondary | ICD-10-CM

## 2015-09-11 LAB — CBC WITH DIFFERENTIAL/PLATELET
Basophils Absolute: 0 10*3/uL (ref 0–0.1)
Basophils Relative: 1 %
Eosinophils Absolute: 0.1 10*3/uL (ref 0–0.7)
Eosinophils Relative: 2 %
HCT: 40.3 % (ref 40.0–52.0)
Hemoglobin: 13.8 g/dL (ref 13.0–18.0)
Lymphocytes Relative: 45 %
Lymphs Abs: 1.7 10*3/uL (ref 1.0–3.6)
MCH: 26.4 pg (ref 26.0–34.0)
MCHC: 34.1 g/dL (ref 32.0–36.0)
MCV: 77.3 fL — ABNORMAL LOW (ref 80.0–100.0)
Monocytes Absolute: 0.4 10*3/uL (ref 0.2–1.0)
Monocytes Relative: 11 %
Neutro Abs: 1.5 10*3/uL (ref 1.4–6.5)
Neutrophils Relative %: 41 %
Platelets: 156 10*3/uL (ref 150–440)
RBC: 5.21 MIL/uL (ref 4.40–5.90)
RDW: 13.4 % (ref 11.5–14.5)
WBC: 3.7 10*3/uL — ABNORMAL LOW (ref 3.8–10.6)

## 2015-09-11 LAB — COMPREHENSIVE METABOLIC PANEL
ALT: 21 U/L (ref 17–63)
AST: 30 U/L (ref 15–41)
Albumin: 4.1 g/dL (ref 3.5–5.0)
Alkaline Phosphatase: 86 U/L (ref 38–126)
Anion gap: 7 (ref 5–15)
BUN: 11 mg/dL (ref 6–20)
CO2: 25 mmol/L (ref 22–32)
Calcium: 8.6 mg/dL — ABNORMAL LOW (ref 8.9–10.3)
Chloride: 105 mmol/L (ref 101–111)
Creatinine, Ser: 0.74 mg/dL (ref 0.61–1.24)
GFR calc Af Amer: >60 mL/min (ref >60)
GFR calc non Af Amer: >60 mL/min (ref >60)
Glucose, Bld: 120 mg/dL — ABNORMAL HIGH (ref 65–99)
Potassium: 3.6 mmol/L (ref 3.5–5.1)
Sodium: 137 mmol/L (ref 135–145)
Total Bilirubin: 0.4 mg/dL (ref 0.3–1.2)
Total Protein: 7.1 g/dL (ref 6.5–8.1)

## 2015-09-11 LAB — MAGNESIUM: Magnesium: 2 mg/dL (ref 1.7–2.4)

## 2015-09-11 MED ORDER — DEXTROSE 5 % IV SOLN
Freq: Once | INTRAVENOUS | Status: AC
  Administered 2015-09-11: 11:00:00 via INTRAVENOUS
  Filled 2015-09-11: qty 1000

## 2015-09-11 MED ORDER — SODIUM CHLORIDE 0.9% FLUSH
10.0000 mL | INTRAVENOUS | Status: DC | PRN
  Administered 2015-09-11: 10 mL
  Filled 2015-09-11: qty 10

## 2015-09-11 MED ORDER — SODIUM CHLORIDE 0.9 % IV SOLN
10.0000 mg | Freq: Once | INTRAVENOUS | Status: AC
  Administered 2015-09-11: 10 mg via INTRAVENOUS
  Filled 2015-09-11: qty 1

## 2015-09-11 MED ORDER — PALONOSETRON HCL INJECTION 0.25 MG/5ML
0.2500 mg | Freq: Once | INTRAVENOUS | Status: AC
  Administered 2015-09-11: 0.25 mg via INTRAVENOUS
  Filled 2015-09-11: qty 5

## 2015-09-11 MED ORDER — SODIUM CHLORIDE 0.9 % IV SOLN
2400.0000 mg/m2 | INTRAVENOUS | Status: DC
  Administered 2015-09-11: 4700 mg via INTRAVENOUS
  Filled 2015-09-11: qty 94

## 2015-09-11 MED ORDER — FLUOROURACIL CHEMO INJECTION 2.5 GM/50ML
400.0000 mg/m2 | Freq: Once | INTRAVENOUS | Status: AC
  Administered 2015-09-11: 800 mg via INTRAVENOUS
  Filled 2015-09-11: qty 16

## 2015-09-11 MED ORDER — LEUCOVORIN CALCIUM INJECTION 350 MG
800.0000 mg | Freq: Once | INTRAVENOUS | Status: AC
  Administered 2015-09-11: 800 mg via INTRAVENOUS
  Filled 2015-09-11: qty 25

## 2015-09-11 MED ORDER — OXALIPLATIN CHEMO INJECTION 100 MG/20ML
85.0000 mg/m2 | Freq: Once | INTRAVENOUS | Status: AC
  Administered 2015-09-11: 165 mg via INTRAVENOUS
  Filled 2015-09-11: qty 33

## 2015-09-11 NOTE — Progress Notes (Signed)
States is feeling well this morning but last week had episodes of nausea, stomach cramps, and upper back pain. Feeling better this morning. Weight has remained stable.

## 2015-09-13 ENCOUNTER — Inpatient Hospital Stay: Payer: 59

## 2015-09-13 DIAGNOSIS — C801 Malignant (primary) neoplasm, unspecified: Secondary | ICD-10-CM

## 2015-09-13 MED ORDER — SODIUM CHLORIDE 0.9 % IJ SOLN
10.0000 mL | Freq: Once | INTRAMUSCULAR | Status: AC
  Administered 2015-09-13: 10 mL via INTRAVENOUS
  Filled 2015-09-13: qty 10

## 2015-09-13 MED ORDER — HEPARIN SOD (PORK) LOCK FLUSH 100 UNIT/ML IV SOLN
INTRAVENOUS | Status: AC
  Filled 2015-09-13: qty 5

## 2015-09-13 MED ORDER — HEPARIN SOD (PORK) LOCK FLUSH 100 UNIT/ML IV SOLN
500.0000 [IU] | Freq: Once | INTRAVENOUS | Status: AC
  Administered 2015-09-13: 500 [IU] via INTRAVENOUS

## 2015-09-18 ENCOUNTER — Ambulatory Visit: Payer: 59 | Admitting: Hematology and Oncology

## 2015-09-18 ENCOUNTER — Ambulatory Visit: Payer: 59

## 2015-09-18 ENCOUNTER — Other Ambulatory Visit: Payer: 59

## 2015-09-18 ENCOUNTER — Inpatient Hospital Stay: Payer: 59

## 2015-09-18 DIAGNOSIS — E876 Hypokalemia: Secondary | ICD-10-CM

## 2015-09-18 DIAGNOSIS — C169 Malignant neoplasm of stomach, unspecified: Secondary | ICD-10-CM

## 2015-09-18 DIAGNOSIS — K2970 Gastritis, unspecified, without bleeding: Secondary | ICD-10-CM

## 2015-09-18 DIAGNOSIS — R718 Other abnormality of red blood cells: Secondary | ICD-10-CM

## 2015-09-18 DIAGNOSIS — D509 Iron deficiency anemia, unspecified: Secondary | ICD-10-CM

## 2015-09-18 DIAGNOSIS — B9681 Helicobacter pylori [H. pylori] as the cause of diseases classified elsewhere: Secondary | ICD-10-CM

## 2015-09-18 DIAGNOSIS — C168 Malignant neoplasm of overlapping sites of stomach: Secondary | ICD-10-CM

## 2015-09-18 LAB — CBC WITH DIFFERENTIAL/PLATELET
Basophils Absolute: 0 10*3/uL (ref 0–0.1)
Basophils Relative: 1 %
Eosinophils Absolute: 0.1 10*3/uL (ref 0–0.7)
Eosinophils Relative: 4 %
HCT: 40 % (ref 40.0–52.0)
Hemoglobin: 13.9 g/dL (ref 13.0–18.0)
Lymphocytes Relative: 49 %
Lymphs Abs: 1.6 10*3/uL (ref 1.0–3.6)
MCH: 26.6 pg (ref 26.0–34.0)
MCHC: 34.9 g/dL (ref 32.0–36.0)
MCV: 76.5 fL — ABNORMAL LOW (ref 80.0–100.0)
Monocytes Absolute: 0.4 10*3/uL (ref 0.2–1.0)
Monocytes Relative: 14 %
Neutro Abs: 1 10*3/uL — ABNORMAL LOW (ref 1.4–6.5)
Neutrophils Relative %: 32 %
Platelets: 174 10*3/uL (ref 150–440)
RBC: 5.23 MIL/uL (ref 4.40–5.90)
RDW: 13.9 % (ref 11.5–14.5)
WBC: 3.3 10*3/uL — ABNORMAL LOW (ref 3.8–10.6)

## 2015-09-18 LAB — FERRITIN: Ferritin: 116 ng/mL (ref 24–336)

## 2015-09-21 ENCOUNTER — Other Ambulatory Visit: Payer: Self-pay

## 2015-09-24 ENCOUNTER — Encounter: Payer: Self-pay | Admitting: Hematology and Oncology

## 2015-09-25 ENCOUNTER — Inpatient Hospital Stay: Payer: 59

## 2015-09-25 ENCOUNTER — Encounter: Payer: Self-pay | Admitting: Hematology and Oncology

## 2015-09-25 ENCOUNTER — Other Ambulatory Visit: Payer: Self-pay | Admitting: Hematology and Oncology

## 2015-09-25 ENCOUNTER — Inpatient Hospital Stay (HOSPITAL_BASED_OUTPATIENT_CLINIC_OR_DEPARTMENT_OTHER): Payer: 59 | Admitting: Hematology and Oncology

## 2015-09-25 VITALS — BP 134/87 | HR 71 | Temp 96.1°F | Resp 18 | Wt 180.1 lb

## 2015-09-25 DIAGNOSIS — C786 Secondary malignant neoplasm of retroperitoneum and peritoneum: Secondary | ICD-10-CM | POA: Diagnosis not present

## 2015-09-25 DIAGNOSIS — R718 Other abnormality of red blood cells: Secondary | ICD-10-CM

## 2015-09-25 DIAGNOSIS — J029 Acute pharyngitis, unspecified: Secondary | ICD-10-CM | POA: Diagnosis not present

## 2015-09-25 DIAGNOSIS — K2970 Gastritis, unspecified, without bleeding: Secondary | ICD-10-CM

## 2015-09-25 DIAGNOSIS — K5900 Constipation, unspecified: Secondary | ICD-10-CM

## 2015-09-25 DIAGNOSIS — C168 Malignant neoplasm of overlapping sites of stomach: Secondary | ICD-10-CM

## 2015-09-25 DIAGNOSIS — E876 Hypokalemia: Secondary | ICD-10-CM

## 2015-09-25 DIAGNOSIS — C801 Malignant (primary) neoplasm, unspecified: Secondary | ICD-10-CM

## 2015-09-25 DIAGNOSIS — Z79899 Other long term (current) drug therapy: Secondary | ICD-10-CM

## 2015-09-25 DIAGNOSIS — K219 Gastro-esophageal reflux disease without esophagitis: Secondary | ICD-10-CM

## 2015-09-25 DIAGNOSIS — B9681 Helicobacter pylori [H. pylori] as the cause of diseases classified elsewhere: Secondary | ICD-10-CM

## 2015-09-25 LAB — COMPREHENSIVE METABOLIC PANEL
ALT: 23 U/L (ref 17–63)
AST: 33 U/L (ref 15–41)
Albumin: 4.1 g/dL (ref 3.5–5.0)
Alkaline Phosphatase: 80 U/L (ref 38–126)
Anion gap: 7 (ref 5–15)
BUN: 7 mg/dL (ref 6–20)
CO2: 25 mmol/L (ref 22–32)
Calcium: 8.6 mg/dL — ABNORMAL LOW (ref 8.9–10.3)
Chloride: 105 mmol/L (ref 101–111)
Creatinine, Ser: 0.76 mg/dL (ref 0.61–1.24)
GFR calc Af Amer: >60 mL/min (ref >60)
GFR calc non Af Amer: >60 mL/min (ref >60)
Glucose, Bld: 152 mg/dL — ABNORMAL HIGH (ref 65–99)
Potassium: 3.6 mmol/L (ref 3.5–5.1)
Sodium: 137 mmol/L (ref 135–145)
Total Bilirubin: 0.6 mg/dL (ref 0.3–1.2)
Total Protein: 7.3 g/dL (ref 6.5–8.1)

## 2015-09-25 LAB — CBC WITH DIFFERENTIAL/PLATELET
Basophils Absolute: 0.1 10*3/uL (ref 0–0.1)
Basophils Relative: 1 %
Eosinophils Absolute: 0.1 10*3/uL (ref 0–0.7)
Eosinophils Relative: 3 %
HCT: 41.5 % (ref 40.0–52.0)
Hemoglobin: 14.3 g/dL (ref 13.0–18.0)
Lymphocytes Relative: 28 %
Lymphs Abs: 1.2 10*3/uL (ref 1.0–3.6)
MCH: 26.6 pg (ref 26.0–34.0)
MCHC: 34.4 g/dL (ref 32.0–36.0)
MCV: 77.2 fL — ABNORMAL LOW (ref 80.0–100.0)
Monocytes Absolute: 0.6 10*3/uL (ref 0.2–1.0)
Monocytes Relative: 13 %
Neutro Abs: 2.4 10*3/uL (ref 1.4–6.5)
Neutrophils Relative %: 55 %
Platelets: 118 10*3/uL — ABNORMAL LOW (ref 150–440)
RBC: 5.38 MIL/uL (ref 4.40–5.90)
RDW: 14.5 % (ref 11.5–14.5)
WBC: 4.3 10*3/uL (ref 3.8–10.6)

## 2015-09-25 LAB — MAGNESIUM: Magnesium: 2.1 mg/dL (ref 1.7–2.4)

## 2015-09-25 MED ORDER — SODIUM CHLORIDE 0.9% FLUSH
10.0000 mL | INTRAVENOUS | Status: DC | PRN
  Administered 2015-09-25: 10 mL
  Filled 2015-09-25: qty 10

## 2015-09-25 MED ORDER — SODIUM CHLORIDE 0.9 % IV SOLN
2400.0000 mg/m2 | INTRAVENOUS | Status: DC
  Administered 2015-09-25: 4700 mg via INTRAVENOUS
  Filled 2015-09-25: qty 94

## 2015-09-25 MED ORDER — PALONOSETRON HCL INJECTION 0.25 MG/5ML
0.2500 mg | Freq: Once | INTRAVENOUS | Status: AC
  Administered 2015-09-25: 0.25 mg via INTRAVENOUS
  Filled 2015-09-25: qty 5

## 2015-09-25 MED ORDER — SODIUM CHLORIDE 0.9 % IV SOLN
10.0000 mg | Freq: Once | INTRAVENOUS | Status: AC
  Administered 2015-09-25: 10 mg via INTRAVENOUS
  Filled 2015-09-25: qty 1

## 2015-09-25 MED ORDER — OXALIPLATIN CHEMO INJECTION 100 MG/20ML
85.0000 mg/m2 | Freq: Once | INTRAVENOUS | Status: AC
  Administered 2015-09-25: 165 mg via INTRAVENOUS
  Filled 2015-09-25: qty 33

## 2015-09-25 MED ORDER — FLUOROURACIL CHEMO INJECTION 2.5 GM/50ML
400.0000 mg/m2 | Freq: Once | INTRAVENOUS | Status: AC
  Administered 2015-09-25: 800 mg via INTRAVENOUS
  Filled 2015-09-25: qty 16

## 2015-09-25 MED ORDER — DEXTROSE 5 % IV SOLN
Freq: Once | INTRAVENOUS | Status: AC
  Administered 2015-09-25: 10:00:00 via INTRAVENOUS
  Filled 2015-09-25: qty 1000

## 2015-09-25 MED ORDER — DEXTROSE 5 % IV SOLN
800.0000 mg | Freq: Once | INTRAVENOUS | Status: AC
  Administered 2015-09-25: 800 mg via INTRAVENOUS
  Filled 2015-09-25: qty 40

## 2015-09-25 MED ORDER — HEPARIN SOD (PORK) LOCK FLUSH 100 UNIT/ML IV SOLN: 500.0000 [IU] | Freq: Once | INTRAVENOUS | Status: DC | PRN

## 2015-09-25 NOTE — Progress Notes (Signed)
[facility] day:  09/25/15  Chief Complaint: Anthony Melendez is a 35 y.o. male with metastatic gastric cancer who is seen for assessment prior to cycle #4 FOLFOX chemotherapy.  HPI:  The patient was last seen on 09/11/2015.  At that time, he received cycle #3 FOLFOX chemotherapy.  He had intermittent abdominal cramping.  Weight was stable.  Potassium was normal.  He had mild hypocalcemia (8.6).  He received his chemotherapy uneventfully.  Labs on 09/18/2015 revealed a hematocrit of 40.0, hemoglobin 13.9, MCV 76.5, platelets 174,000, WBC 3300 with an ANC of 1000.  During the interim, he notes 2 days of runny nose and sore throat. He has had no cough, congestion or fever. Nasal drainage is clear and watery. He felt a little warm for 1-2 days. He does not have a thermometer.  He has had no abdominal pain unless he does a "rapid movement". He is eating well.  He has cold sensitivity with his chemotherapy. He is taking 2 times a day.   Past Medical History:  Diagnosis Date  . GERD (gastroesophageal reflux disease)     Past Surgical History:  Procedure Laterality Date  . ESOPHAGOGASTRODUODENOSCOPY (EGD) WITH PROPOFOL N/A 08/02/2015   Procedure: ESOPHAGOGASTRODUODENOSCOPY (EGD) WITH PROPOFOL;  Surgeon: Lucilla Lame, MD;  Location: [facility];  Service: Endoscopy;  Laterality: N/A;  . NO PAST SURGERIES    . PERIPHERAL VASCULAR CATHETERIZATION N/A 08/13/2015   Procedure: Glori Luis Cath Insertion;  Surgeon: Algernon Huxley, MD;  Location: [facility];  Service: Cardiovascular;  Laterality: N/A;    History reviewed. No pertinent family history.  Social History:  reports that he has never smoked. He has never used smokeless tobacco. He reports that he does not drink alcohol or use drugs.  The patient is from [place].  He speaks [place].  He moves to the [place] in 2000.  He lives in [place] with his father, mother, brother and sister.  He  is a Theme park manager.  The patient is accompanied by his brother today.  The interpreter is present.  Allergies: No Known Allergies  Current Medications: Current Outpatient Prescriptions  Medication Sig Dispense Refill  . ferrous sulfate 325 (65 FE) MG EC tablet Take 1 tablet (325 mg total) by mouth daily with breakfast. 30 tablet 3  . omeprazole (PRILOSEC) 20 MG capsule Take 1 capsule (20 mg total) by mouth daily. 30 capsule 1  . potassium chloride (K-DUR) 10 MEQ tablet Take 1 daily or as directed by dr Mike Gip 30 tablet 1  . prochlorperazine (COMPAZINE) 10 MG tablet Take 1 tablet (10 mg total) by mouth every 6 (six) hours as needed for nausea or vomiting. 30 tablet 1   No current facility-administered medications for this visit.    Facility-Administered Medications Ordered in Other Visits  Medication Dose Route Frequency Provider Last Rate Last Dose  . sodium chloride flush (NS) 0.9 % injection 10 mL  10 mL Intravenous PRN Lequita Asal, MD   10 mL at 08/28/15 6629    Review of Systems:  GENERAL:  Feels good.  No fevers or sweats.  Weight up 2 pounds. PERFORMANCE STATUS (ECOG):  1 HEENT:  Clear runny nose and sore throat.  No visual changes, mouth sores or tenderness. Lungs: No shortness of breath or cough.  No hemoptysis. Cardiac:  No chest pain, palpitations, orthopnea, or PND. GI:   Stomach pain with rapid movement.  No vomiting, diarrhea, constipation, melena or hematochezia. GU:  No urgency, frequency, dysuria, or hematuria. Musculoskeletal:  No back or joint pain.  No muscle tenderness. Extremities:  No pain or swelling. Skin:  No rashes or skin changes. Neuro:  No headache, numbness or weakness, balance or coordination issues. Endocrine:  No diabetes, thyroid issues, hot flashes or night sweats. Psych:  No mood changes, depression or anxiety. Pain:  No focal pain. Review of systems:  All other systems reviewed and found to be negative.  Physical Exam: Blood pressure 134/87,  pulse 71, temperature (!) 96.1 F (35.6 C), temperature source Tympanic, resp. rate 18, weight 180 lb 1.9 oz (81.7 kg). GENERAL:  Well developed, well nourished, gentleman sitting comfortably in the exam room in no acute distress. MENTAL STATUS:  Alert and oriented to person, place and time. HEAD:  Wearing a turquoise cap.  Black hair.  Thin mustache.  Normocephalic, atraumatic, face symmetric, no Cushingoid features. EYES:  Brown eyes.  No conjunctivitis or scleral icterus. ENT:  Oropharynx clear without lesion.  Tongue normal. Mucous membranes moist.  RESPIRATORY:  Clear to auscultation without rales, wheezes or rhonchi. CARDIOVASCULAR:  Regular rate and rhythm without murmur, rub or gallop. ABDOMEN:  Soft, non-tender, with active bowel sounds, and no hepatosplenomegaly.  No guarding or rebound tenderness.  No masses. SKIN:  No rashes, ulcers or lesions. EXTREMITIES: No edema, no skin discoloration or tenderness.  No palpable cords. LYMPH NODES: No palpable cervical, supraclavicular, axillary or inguinal adenopathy  NEUROLOGICAL: Unremarkable. PSYCH:  Appropriate.  NEUROLOGICAL: Appropriate.   Appointment on 09/25/2015  Component Date Value Ref Range Status  . Magnesium 09/25/2015 2.1  1.7 - 2.4 mg/dL Final  . WBC 09/25/2015 4.3  3.8 - 10.6 K/uL Final  . RBC 09/25/2015 5.38  4.40 - 5.90 MIL/uL Final  . Hemoglobin 09/25/2015 14.3  13.0 - 18.0 g/dL Final  . HCT 09/25/2015 41.5  40.0 - 52.0 % Final  . MCV 09/25/2015 77.2* 80.0 - 100.0 fL Final  . MCH 09/25/2015 26.6  26.0 - 34.0 pg Final  . MCHC 09/25/2015 34.4  32.0 - 36.0 g/dL Final  . RDW 09/25/2015 14.5  11.5 - 14.5 % Final  . Platelets 09/25/2015 118* 150 - 440 K/uL Final  . Neutrophils Relative % 09/25/2015 55  % Final  . Neutro Abs 09/25/2015 2.4  1.4 - 6.5 K/uL Final  . Lymphocytes Relative 09/25/2015 28  % Final  . Lymphs Abs 09/25/2015 1.2  1.0 - 3.6 K/uL Final  . Monocytes Relative 09/25/2015 13  % Final  . Monocytes  Absolute 09/25/2015 0.6  0.2 - 1.0 K/uL Final  . Eosinophils Relative 09/25/2015 3  % Final  . Eosinophils Absolute 09/25/2015 0.1  0 - 0.7 K/uL Final  . Basophils Relative 09/25/2015 1  % Final  . Basophils Absolute 09/25/2015 0.1  0 - 0.1 K/uL Final  . Sodium 09/25/2015 137  135 - 145 mmol/L Final  . Potassium 09/25/2015 3.6  3.5 - 5.1 mmol/L Final  . Chloride 09/25/2015 105  101 - 111 mmol/L Final  . CO2 09/25/2015 25  22 - 32 mmol/L Final  . Glucose, Bld 09/25/2015 152* 65 - 99 mg/dL Final  . BUN 09/25/2015 7  6 - 20 mg/dL Final  . Creatinine, Ser 09/25/2015 0.76  0.61 - 1.24 mg/dL Final  . Calcium 09/25/2015 8.6* 8.9 - 10.3 mg/dL Final  . Total Protein 09/25/2015 7.3  6.5 - 8.1 g/dL Final  . Albumin 09/25/2015 4.1  3.5 - 5.0 g/dL Final  . AST 09/25/2015 33  15 - 41 U/L Final  . ALT 09/25/2015 23  17 - 63 U/L Final  . Alkaline Phosphatase 09/25/2015 80  38 - 126 U/L Final  . Total Bilirubin 09/25/2015 0.6  0.3 - 1.2 mg/dL Final  . GFR calc non Af Amer 09/25/2015 >60  >60 mL/min Final  . GFR calc Af Amer 09/25/2015 >60  >60 mL/min Final   Comment: (NOTE) The eGFR has been calculated using the CKD EPI equation. This calculation has not been validated in all clinical situations. eGFR's persistently <60 mL/min signify possible Chronic Kidney Disease.   Georgiann Hahn gap 09/25/2015 7  5 - 15 Final    Assessment:  Santo Zahradnik is a 35 y.o. male with metastatic gastric cancer.  He presented with a 4-6 week history of progressive epigastric pain.  EGD on 08/02/2015 revealed a large, ulcerated, non-circumferential mass with oozing and stigmata of recent bleeding in the entire examined stomach.  The mass involved the fundus down to the antrum.  Biopsies revealed poorly differentiated adenocarcinoma.  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  Her2/neu testing was negative.  Microsatellite testing was negative.  Chest, abdomen and pelvic CT scan on 07/29/2015  revealed a 4 cm thickened and irregular gastric wall compatible with an infiltrative malignancy. There was extensive involvement of the upper mesentery and omentum compatible with peritoneal carcinomatosis. There was a small amount of ascites.   Head CT on 08/08/2015 revealed no evidence of metastatic disease.  He completed treatment for H pylori  (began 08/09/2015).  He has iron deficiency anemia likely gastric oozing and modest diet.  RBC are microcytic.  Labs on 08/21/2015 revealed a ferritin was 44 with an iron saturation 4% and a TIBC of 395.  He is s/p 3 cycles of FOLFOX chemotherapy (08/14/2015 - 09/11/2015).  He tolerated his chemotherapy well without diarrhea, mouth sores, or neuropathy.  Symptomatically, he has had a runny nose and sore throat.  Weight is stable.  Potassium is normal (3.6).  He has mild hypocalcemia (8.6).  Plan: 1.  Labs today:  CBC with diff, CMP, Mg. 2.  Cycle #4 FOLFOX chemotherapy. 3.  RTC in 2 days for disconnect. 4.  Testing for [facility].  Patient consented. 5.  Discuss fever and neutropenia precautions.  Nurse provided patient with a thermometer. 6.  Increase calcium supplementation from 2 to 3 tablets a day. 7.  RTC in 1 week for CBC with diff 8.  Abdomen and pelvic CT scan on 10/05/2015 9.  RTC in 2 weeks for MD assessment, labs (CBC with diff, CMP, Mg) and cycle #5 FOLFOX.   Lequita Asal, MD  09/25/2015, 9:53 AM

## 2015-09-25 NOTE — Progress Notes (Signed)
Pt has tolerated chemo and for first 4-5 days with pump and counting after then he feels better.  His only sx is fatigue until the 5 days are over.  He is eating good, urinating good and having reg. Bowel movements. No pain. Did have maybe a fever but did not check his fever and has no thermometer. Gave him thermometer today. He felt his throat was sore yest, no drainage from nasal cavity.  Spoke to pt about [facility] lab and he did bring pay check stubs.  He states his mom gets paid every 2 weeks and his Dad gets paid every week and depending on work hours may decrease when business is down or slow.  Spoke to pt about why to do test and the cost usually is no more than 400 dollars according to the rep from [facility] and was told that if pt tests positive that if his family is tested within 90 days of results for pt then it is free for family.  Pt agreeable and signed consent. Turned in test request and applicant for pt asst with payment to [facility] and shipped test off today with forms in shipping box.

## 2015-09-26 ENCOUNTER — Encounter: Payer: Self-pay | Admitting: Hematology and Oncology

## 2015-09-26 ENCOUNTER — Other Ambulatory Visit: Payer: Self-pay | Admitting: *Deleted

## 2015-09-26 DIAGNOSIS — C169 Malignant neoplasm of stomach, unspecified: Secondary | ICD-10-CM

## 2015-09-27 ENCOUNTER — Inpatient Hospital Stay: Payer: 59

## 2015-09-27 VITALS — BP 105/71 | HR 56 | Temp 97.0°F | Resp 18

## 2015-09-27 DIAGNOSIS — C168 Malignant neoplasm of overlapping sites of stomach: Secondary | ICD-10-CM

## 2015-09-27 MED ORDER — HEPARIN SOD (PORK) LOCK FLUSH 100 UNIT/ML IV SOLN
500.0000 [IU] | Freq: Once | INTRAVENOUS | Status: AC | PRN
  Administered 2015-09-27: 500 [IU]

## 2015-09-27 MED ORDER — SODIUM CHLORIDE 0.9% FLUSH
10.0000 mL | INTRAVENOUS | Status: DC | PRN
  Administered 2015-09-27: 10 mL
  Filled 2015-09-27: qty 10

## 2015-09-27 MED ORDER — HEPARIN SOD (PORK) LOCK FLUSH 100 UNIT/ML IV SOLN
INTRAVENOUS | Status: AC
  Filled 2015-09-27: qty 5

## 2015-10-05 ENCOUNTER — Ambulatory Visit
Admission: RE | Admit: 2015-10-05 | Discharge: 2015-10-05 | Disposition: A | Discharge status: Home / Self Care | Payer: 59 | Source: Ambulatory Visit | Attending: Hematology and Oncology | Admitting: Hematology and Oncology

## 2015-10-05 ENCOUNTER — Inpatient Hospital Stay: Payer: 59 | Attending: Hematology and Oncology

## 2015-10-05 DIAGNOSIS — D509 Iron deficiency anemia, unspecified: Secondary | ICD-10-CM | POA: Insufficient documentation

## 2015-10-05 DIAGNOSIS — K259 Gastric ulcer, unspecified as acute or chronic, without hemorrhage or perforation: Secondary | ICD-10-CM | POA: Insufficient documentation

## 2015-10-05 DIAGNOSIS — R5383 Other fatigue: Secondary | ICD-10-CM | POA: Insufficient documentation

## 2015-10-05 DIAGNOSIS — R109 Unspecified abdominal pain: Secondary | ICD-10-CM | POA: Insufficient documentation

## 2015-10-05 DIAGNOSIS — Z79899 Other long term (current) drug therapy: Secondary | ICD-10-CM | POA: Insufficient documentation

## 2015-10-05 DIAGNOSIS — B9681 Helicobacter pylori [H. pylori] as the cause of diseases classified elsewhere: Secondary | ICD-10-CM | POA: Insufficient documentation

## 2015-10-05 DIAGNOSIS — C786 Secondary malignant neoplasm of retroperitoneum and peritoneum: Secondary | ICD-10-CM | POA: Insufficient documentation

## 2015-10-05 DIAGNOSIS — Z1509 Genetic susceptibility to other malignant neoplasm: Secondary | ICD-10-CM | POA: Insufficient documentation

## 2015-10-05 DIAGNOSIS — Z7689 Persons encountering health services in other specified circumstances: Secondary | ICD-10-CM | POA: Insufficient documentation

## 2015-10-05 DIAGNOSIS — C168 Malignant neoplasm of overlapping sites of stomach: Secondary | ICD-10-CM | POA: Insufficient documentation

## 2015-10-05 DIAGNOSIS — K219 Gastro-esophageal reflux disease without esophagitis: Secondary | ICD-10-CM | POA: Insufficient documentation

## 2015-10-05 DIAGNOSIS — C801 Malignant (primary) neoplasm, unspecified: Secondary | ICD-10-CM | POA: Insufficient documentation

## 2015-10-05 DIAGNOSIS — Z5111 Encounter for antineoplastic chemotherapy: Secondary | ICD-10-CM | POA: Insufficient documentation

## 2015-10-05 DIAGNOSIS — R935 Abnormal findings on diagnostic imaging of other abdominal regions, including retroperitoneum: Secondary | ICD-10-CM | POA: Insufficient documentation

## 2015-10-05 DIAGNOSIS — C165 Malignant neoplasm of lesser curvature of stomach, unspecified: Secondary | ICD-10-CM | POA: Insufficient documentation

## 2015-10-05 DIAGNOSIS — D696 Thrombocytopenia, unspecified: Secondary | ICD-10-CM | POA: Insufficient documentation

## 2015-10-05 DIAGNOSIS — C169 Malignant neoplasm of stomach, unspecified: Secondary | ICD-10-CM

## 2015-10-05 DIAGNOSIS — K2970 Gastritis, unspecified, without bleeding: Secondary | ICD-10-CM | POA: Insufficient documentation

## 2015-10-05 HISTORY — DX: Malignant neoplasm of stomach, unspecified: C16.9

## 2015-10-05 LAB — CBC WITH DIFFERENTIAL/PLATELET
Basophils Absolute: 0 10*3/uL (ref 0–0.1)
Basophils Relative: 1 %
Eosinophils Absolute: 0.1 10*3/uL (ref 0–0.7)
Eosinophils Relative: 1 %
HCT: 42.4 % (ref 40.0–52.0)
Hemoglobin: 14.6 g/dL (ref 13.0–18.0)
Lymphocytes Relative: 40 %
Lymphs Abs: 2.1 10*3/uL (ref 1.0–3.6)
MCH: 26.5 pg (ref 26.0–34.0)
MCHC: 34.4 g/dL (ref 32.0–36.0)
MCV: 77.2 fL — ABNORMAL LOW (ref 80.0–100.0)
Monocytes Absolute: 0.7 10*3/uL (ref 0.2–1.0)
Monocytes Relative: 14 %
Neutro Abs: 2.3 10*3/uL (ref 1.4–6.5)
Neutrophils Relative %: 44 %
Platelets: 159 10*3/uL (ref 150–440)
RBC: 5.49 MIL/uL (ref 4.40–5.90)
RDW: 16.2 % — ABNORMAL HIGH (ref 11.5–14.5)
WBC: 5.2 10*3/uL (ref 3.8–10.6)

## 2015-10-05 MED ORDER — IOPAMIDOL (ISOVUE-300) INJECTION 61%
100.0000 mL | Freq: Once | INTRAVENOUS | Status: AC | PRN
  Administered 2015-10-05: 100 mL via INTRAVENOUS

## 2015-10-08 NOTE — Progress Notes (Signed)
[facility] day:  10/09/15  Chief Complaint: Anthony Melendez is a 35 y.o. male with metastatic gastric cancer who is seen for review of restaging studies and assessment prior to cycle #5 FOLFOX chemotherapy.  HPI:  The patient was last seen on 09/25/2015.  At that time, he received cycle #4 FOLFOX chemotherapy.  He had a runny nose and sore throat.  Weight was stable.  He had mild hypocalcemia (8.6).  Platelet count was 118,000.  Calcium supplementation was increased.  Labs on 10/05/2015 revealed a hematocrit of 42.4, hemoglobin 114.6, MCV 77.2, platelets 159,000, WBC 5200 with an ANC of 2300.    He underwent genetic testing from [facility] at last visit.  He underwent restaging studies.  Abdomen and pelvic CT scan on 10/05/2015 revealed a positive response to therapy with decreased size of the primary mass along the lesser curvature of the stomach, decrease extension of the mass into the gastrohepatic ligament, decreasing evidence of peritoneal carcinomatosis involving predominantly the omentum, and resolution of previously noted malignant ascites. There was a potential ulcer along the greater curvature of the mid body of the stomach.  During the interim, he has had some abdominal pain.  He ran out of his omeprazole 3-4 days ago.  Last black stool was a month ago.  He is eating well.   Past Medical History:  Diagnosis Date  . Gastric cancer ([facility]) 08/2015   Folfox chemo tx's.  Marland Kitchen GERD (gastroesophageal reflux disease)     Past Surgical History:  Procedure Laterality Date  . ESOPHAGOGASTRODUODENOSCOPY (EGD) WITH PROPOFOL N/A 08/02/2015   Procedure: ESOPHAGOGASTRODUODENOSCOPY (EGD) WITH PROPOFOL;  Surgeon: Lucilla Lame, MD;  Location: [facility];  Service: Endoscopy;  Laterality: N/A;  . NO PAST SURGERIES    . PERIPHERAL VASCULAR CATHETERIZATION N/A 08/13/2015   Procedure: Glori Luis Cath Insertion;  Surgeon: Algernon Huxley, MD;  Location: [facility];  Service: Cardiovascular;  Laterality: N/A;    No family history on file.  Social History:  reports that he has never smoked. He has never used smokeless tobacco. He reports that he does not drink alcohol or use drugs.  The patient is from [place].  He speaks [place].  He moves to the [place] in 2000.  He lives in [place] with his father, mother, brother and sister.  He is a Theme park manager.  The patient is accompanied by his mother and 63 year old nephew, Cristie Hem, brother today.  The interpreter is present.  Allergies: No Known Allergies  Current Medications: Current Outpatient Prescriptions  Medication Sig Dispense Refill  . ferrous sulfate 325 (65 FE) MG EC tablet Take 1 tablet (325 mg total) by mouth daily with breakfast. 30 tablet 3  . omeprazole (PRILOSEC) 20 MG capsule Take 1 capsule (20 mg total) by mouth daily. 30 capsule 1  . potassium chloride (K-DUR) 10 MEQ tablet Take 1 daily or as directed by dr Mike Gip 30 tablet 1  . prochlorperazine (COMPAZINE) 10 MG tablet Take 1 tablet (10 mg total) by mouth every 6 (six) hours as needed for nausea or vomiting. 30 tablet 1   No current facility-administered medications for this visit.    Facility-Administered Medications Ordered in Other Visits  Medication Dose Route Frequency Provider Last Rate Last Dose  . sodium chloride flush (NS) 0.9 % injection 10 mL  10 mL Intravenous PRN Lequita Asal, MD   10 mL at 08/28/15 9604    Review of Systems:  GENERAL:  Feels fine.  No fevers or sweats.  Weight down 1 pound. PERFORMANCE STATUS (ECOG):  1 HEENT:  No visual changes, runny nose, sore throat, mouth sores or tenderness. Lungs: No shortness of breath or cough.  No hemoptysis. Cardiac:  No chest pain, palpitations, orthopnea, or PND. GI:   Abdominal discomfort/stinging after ran out of omeprazole.  No vomiting, diarrhea, constipation, melena or hematochezia. GU:  No urgency, frequency, dysuria, or  hematuria. Musculoskeletal:  No back or joint pain.  No muscle tenderness. Extremities:  No pain or swelling. Skin:  No rashes or skin changes. Neuro:  No headache, numbness or weakness, balance or coordination issues. Endocrine:  No diabetes, thyroid issues, hot flashes or night sweats. Psych:  No mood changes, depression or anxiety. Pain:  Abdominal discomfort. Review of systems:  All other systems reviewed and found to be negative.  Physical Exam: Blood pressure 129/87, pulse 64, temperature (!) 96.6 F (35.9 C), temperature source Tympanic, resp. rate 18, weight 179 lb 10.8 oz (81.5 kg). GENERAL:  Well developed, well nourished, gentleman sitting comfortably in the exam room in no acute distress. MENTAL STATUS:  Alert and oriented to person, place and time. HEAD:  Wearing a blue [employer] cap.  Black hair.  Thin mustache.  Normocephalic, atraumatic, face symmetric, no Cushingoid features. EYES:  Brown eyes.  No conjunctivitis or scleral icterus. ENT:  Oropharynx clear without lesion.  Tongue normal. Mucous membranes moist.  RESPIRATORY:  Clear to auscultation without rales, wheezes or rhonchi. CARDIOVASCULAR:  Regular rate and rhythm without murmur, rub or gallop. ABDOMEN:  Soft, minimally tender in the epigastric region without guarding or rebound tenderness.  Active bowel sounds and no hepatosplenomegaly.  No masses. SKIN:  No rashes, ulcers or lesions. EXTREMITIES: No edema, no skin discoloration or tenderness.  No palpable cords. LYMPH NODES: No palpable cervical, supraclavicular, axillary or inguinal adenopathy  NEUROLOGICAL: Unremarkable. PSYCH:  Appropriate.  NEUROLOGICAL: Appropriate.   Infusion on 10/09/2015  Component Date Value Ref Range Status  . WBC 10/09/2015 3.4* 3.8 - 10.6 K/uL Final  . RBC 10/09/2015 5.37  4.40 - 5.90 MIL/uL Final  . Hemoglobin 10/09/2015 14.4  13.0 - 18.0 g/dL Final  . HCT 10/09/2015 41.9  40.0 - 52.0 % Final  . MCV 10/09/2015 78.1* 80.0 - 100.0 fL  Final  . MCH 10/09/2015 26.8  26.0 - 34.0 pg Final  . MCHC 10/09/2015 34.3  32.0 - 36.0 g/dL Final  . RDW 10/09/2015 17.6* 11.5 - 14.5 % Final  . Platelets 10/09/2015 123* 150 - 440 K/uL Final  . Neutrophils Relative % 10/09/2015 37  % Final  . Neutro Abs 10/09/2015 1.3* 1.4 - 6.5 K/uL Final  . Lymphocytes Relative 10/09/2015 49  % Final  . Lymphs Abs 10/09/2015 1.6  1.0 - 3.6 K/uL Final  . Monocytes Relative 10/09/2015 11  % Final  . Monocytes Absolute 10/09/2015 0.4  0.2 - 1.0 K/uL Final  . Eosinophils Relative 10/09/2015 2  % Final  . Eosinophils Absolute 10/09/2015 0.1  0 - 0.7 K/uL Final  . Basophils Relative 10/09/2015 1  % Final  . Basophils Absolute 10/09/2015 0.0  0 - 0.1 K/uL Final  . Sodium 10/09/2015 138  135 - 145 mmol/L Final  . Potassium 10/09/2015 3.9  3.5 - 5.1 mmol/L Final  . Chloride 10/09/2015 108  101 - 111 mmol/L Final  . CO2 10/09/2015 25  22 - 32 mmol/L Final  . Glucose, Bld 10/09/2015 106* 65 - 99 mg/dL Final  . BUN  10/09/2015 9  6 - 20 mg/dL Final  . Creatinine, Ser 10/09/2015 0.68  0.61 - 1.24 mg/dL Final  . Calcium 10/09/2015 8.6* 8.9 - 10.3 mg/dL Final  . Total Protein 10/09/2015 7.0  6.5 - 8.1 g/dL Final  . Albumin 10/09/2015 4.1  3.5 - 5.0 g/dL Final  . AST 10/09/2015 29  15 - 41 U/L Final  . ALT 10/09/2015 19  17 - 63 U/L Final  . Alkaline Phosphatase 10/09/2015 69  38 - 126 U/L Final  . Total Bilirubin 10/09/2015 0.6  0.3 - 1.2 mg/dL Final  . GFR calc non Af Amer 10/09/2015 >60  >60 mL/min Final  . GFR calc Af Amer 10/09/2015 >60  >60 mL/min Final   Comment: (NOTE) The eGFR has been calculated using the CKD EPI equation. This calculation has not been validated in all clinical situations. eGFR's persistently <60 mL/min signify possible Chronic Kidney Disease.   . Anion gap 10/09/2015 5  5 - 15 Final  . Magnesium 10/09/2015 2.0  1.7 - 2.4 mg/dL Final    Assessment:  Anthony Melendez is a 35 y.o. male with metastatic gastric cancer.  He presented  with a 4-6 week history of progressive epigastric pain.  EGD on 08/02/2015 revealed a large, ulcerated, non-circumferential mass with oozing and stigmata of recent bleeding in the entire examined stomach.  The mass involved the fundus down to the antrum.  Biopsies revealed poorly differentiated adenocarcinoma.  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  Her2/neu testing was negative.  Microsatellite testing was negative.  Chest, abdomen and pelvic CT scan on 07/29/2015 revealed a 4 cm thickened and irregular gastric wall compatible with an infiltrative malignancy. There was extensive involvement of the upper mesentery and omentum compatible with peritoneal carcinomatosis. There was a small amount of ascites.   Head CT on 08/08/2015 revealed no evidence of metastatic disease.  He completed treatment for H pylori  (began 08/09/2015).  He has iron deficiency anemia likely gastric oozing and modest diet.  RBC are microcytic.  Labs on 08/21/2015 revealed a ferritin was 44 with an iron saturation 4% and a TIBC of 395.  He is s/p 4 cycles of FOLFOX chemotherapy (08/14/2015 - 09/25/2015).  Abdomen and pelvic CT scan on 10/05/2015 revealed decreased size of the primary mass along the lesser curvature of the stomach, decrease extension of the mass into the gastrohepatic ligament, decreasing evidence of peritoneal carcinomatosis involving predominantly the omentum, and resolution of previously noted malignant ascites. There was a potential ulcer along the greater curvature of the mid body of the stomach.  Abdomen and pelvic CT scan on 10/05/2015 revealed a positive response to therapy with decreased size of the primary mass along the lesser curvature of the stomach, decrease extension of the mass into the gastrohepatic ligament, decreasing evidence of peritoneal carcinomatosis involving predominantly the omentum, and resolution of previously noted malignant ascites. There was a potential  ulcer along the greater curvature of the mid body of the stomach.  He has tolerated his chemotherapy well without diarrhea, mouth sores, or neuropathy.  Symptomatically, he has some abdominal pain after running out of omeprazole 3-4 days ago.  Potassium is normal (3.9.  He has mild hypocalcemia (8.6).  Plan: 1.  Labs today:  CBC with diff, CMP, Mg. 2.  Review results of CT scan.  Discuss responding disease.  Discuss gastric ulcer and need to continue PPI. 3.  Discuss borderline counts.  Discuss plan for Neulasta at disconnect.  Discuss use of Claritin  for Neulasta induced bone pain. 4.  Cycle #5 FOLFOX chemotherapy. 5.  RTC in 2 days for disconnect and Neulasta. 6.  Rx: omeprazole 20 mg BID; dis: #60 with 2 refills. 7.  Review scans with Dr. Allen Norris re: ulcer. 8.  RTC in 2 weeks for MD assessment, labs (CBC with diff, CMP, Mg) and cycle #6 FOLFOX.   Lequita Asal, MD  10/09/2015, 9:26 AM

## 2015-10-09 ENCOUNTER — Inpatient Hospital Stay: Payer: 59

## 2015-10-09 ENCOUNTER — Inpatient Hospital Stay (HOSPITAL_BASED_OUTPATIENT_CLINIC_OR_DEPARTMENT_OTHER): Payer: 59 | Admitting: Hematology and Oncology

## 2015-10-09 ENCOUNTER — Other Ambulatory Visit: Payer: Self-pay | Admitting: *Deleted

## 2015-10-09 VITALS — BP 129/87 | HR 64 | Temp 96.6°F | Resp 18 | Wt 179.7 lb

## 2015-10-09 DIAGNOSIS — Z7689 Persons encountering health services in other specified circumstances: Secondary | ICD-10-CM

## 2015-10-09 DIAGNOSIS — Z5111 Encounter for antineoplastic chemotherapy: Secondary | ICD-10-CM

## 2015-10-09 DIAGNOSIS — C786 Secondary malignant neoplasm of retroperitoneum and peritoneum: Secondary | ICD-10-CM

## 2015-10-09 DIAGNOSIS — D509 Iron deficiency anemia, unspecified: Secondary | ICD-10-CM

## 2015-10-09 DIAGNOSIS — C169 Malignant neoplasm of stomach, unspecified: Secondary | ICD-10-CM

## 2015-10-09 DIAGNOSIS — C168 Malignant neoplasm of overlapping sites of stomach: Secondary | ICD-10-CM

## 2015-10-09 DIAGNOSIS — B9681 Helicobacter pylori [H. pylori] as the cause of diseases classified elsewhere: Secondary | ICD-10-CM

## 2015-10-09 DIAGNOSIS — C165 Malignant neoplasm of lesser curvature of stomach, unspecified: Secondary | ICD-10-CM | POA: Diagnosis not present

## 2015-10-09 DIAGNOSIS — C801 Malignant (primary) neoplasm, unspecified: Secondary | ICD-10-CM

## 2015-10-09 DIAGNOSIS — K2970 Gastritis, unspecified, without bleeding: Secondary | ICD-10-CM

## 2015-10-09 DIAGNOSIS — R109 Unspecified abdominal pain: Secondary | ICD-10-CM

## 2015-10-09 DIAGNOSIS — K219 Gastro-esophageal reflux disease without esophagitis: Secondary | ICD-10-CM

## 2015-10-09 DIAGNOSIS — R718 Other abnormality of red blood cells: Secondary | ICD-10-CM

## 2015-10-09 DIAGNOSIS — Z79899 Other long term (current) drug therapy: Secondary | ICD-10-CM

## 2015-10-09 DIAGNOSIS — E876 Hypokalemia: Secondary | ICD-10-CM

## 2015-10-09 LAB — CBC WITH DIFFERENTIAL/PLATELET
Basophils Absolute: 0 10*3/uL (ref 0–0.1)
Basophils Relative: 1 %
Eosinophils Absolute: 0.1 10*3/uL (ref 0–0.7)
Eosinophils Relative: 2 %
HCT: 41.9 % (ref 40.0–52.0)
Hemoglobin: 14.4 g/dL (ref 13.0–18.0)
Lymphocytes Relative: 49 %
Lymphs Abs: 1.6 10*3/uL (ref 1.0–3.6)
MCH: 26.8 pg (ref 26.0–34.0)
MCHC: 34.3 g/dL (ref 32.0–36.0)
MCV: 78.1 fL — ABNORMAL LOW (ref 80.0–100.0)
Monocytes Absolute: 0.4 10*3/uL (ref 0.2–1.0)
Monocytes Relative: 11 %
Neutro Abs: 1.3 10*3/uL — ABNORMAL LOW (ref 1.4–6.5)
Neutrophils Relative %: 37 %
Platelets: 123 10*3/uL — ABNORMAL LOW (ref 150–440)
RBC: 5.37 MIL/uL (ref 4.40–5.90)
RDW: 17.6 % — ABNORMAL HIGH (ref 11.5–14.5)
WBC: 3.4 10*3/uL — ABNORMAL LOW (ref 3.8–10.6)

## 2015-10-09 LAB — COMPREHENSIVE METABOLIC PANEL
ALT: 19 U/L (ref 17–63)
AST: 29 U/L (ref 15–41)
Albumin: 4.1 g/dL (ref 3.5–5.0)
Alkaline Phosphatase: 69 U/L (ref 38–126)
Anion gap: 5 (ref 5–15)
BUN: 9 mg/dL (ref 6–20)
CO2: 25 mmol/L (ref 22–32)
Calcium: 8.6 mg/dL — ABNORMAL LOW (ref 8.9–10.3)
Chloride: 108 mmol/L (ref 101–111)
Creatinine, Ser: 0.68 mg/dL (ref 0.61–1.24)
GFR calc Af Amer: >60 mL/min (ref >60)
GFR calc non Af Amer: >60 mL/min (ref >60)
Glucose, Bld: 106 mg/dL — ABNORMAL HIGH (ref 65–99)
Potassium: 3.9 mmol/L (ref 3.5–5.1)
Sodium: 138 mmol/L (ref 135–145)
Total Bilirubin: 0.6 mg/dL (ref 0.3–1.2)
Total Protein: 7 g/dL (ref 6.5–8.1)

## 2015-10-09 LAB — MAGNESIUM: Magnesium: 2 mg/dL (ref 1.7–2.4)

## 2015-10-09 MED ORDER — SODIUM CHLORIDE 0.9% FLUSH
10.0000 mL | Freq: Once | INTRAVENOUS | Status: AC
  Administered 2015-10-09: 10 mL via INTRAVENOUS
  Filled 2015-10-09: qty 10

## 2015-10-09 MED ORDER — OXALIPLATIN CHEMO INJECTION 100 MG/20ML
85.0000 mg/m2 | Freq: Once | INTRAVENOUS | Status: AC
  Administered 2015-10-09: 165 mg via INTRAVENOUS
  Filled 2015-10-09: qty 33

## 2015-10-09 MED ORDER — FLUOROURACIL CHEMO INJECTION 2.5 GM/50ML
400.0000 mg/m2 | Freq: Once | INTRAVENOUS | Status: AC
  Administered 2015-10-09: 800 mg via INTRAVENOUS
  Filled 2015-10-09: qty 16

## 2015-10-09 MED ORDER — SODIUM CHLORIDE 0.9% FLUSH
10.0000 mL | INTRAVENOUS | Status: DC | PRN
  Administered 2015-10-09: 10 mL
  Filled 2015-10-09: qty 10

## 2015-10-09 MED ORDER — PALONOSETRON HCL INJECTION 0.25 MG/5ML
0.2500 mg | Freq: Once | INTRAVENOUS | Status: AC
  Administered 2015-10-09: 0.25 mg via INTRAVENOUS
  Filled 2015-10-09: qty 5

## 2015-10-09 MED ORDER — SODIUM CHLORIDE 0.9 % IV SOLN
10.0000 mg | Freq: Once | INTRAVENOUS | Status: AC
  Administered 2015-10-09: 10 mg via INTRAVENOUS
  Filled 2015-10-09: qty 1

## 2015-10-09 MED ORDER — LEUCOVORIN CALCIUM INJECTION 350 MG
800.0000 mg | Freq: Once | INTRAVENOUS | Status: AC
  Administered 2015-10-09: 800 mg via INTRAVENOUS
  Filled 2015-10-09: qty 35

## 2015-10-09 MED ORDER — OMEPRAZOLE 20 MG PO CPDR: 20.0000 mg | DELAYED_RELEASE_CAPSULE | Freq: Two times a day (BID) | ORAL | 2 refills | Status: DC

## 2015-10-09 MED ORDER — FLUOROURACIL CHEMO INJECTION 5 GM/100ML
2400.0000 mg/m2 | INTRAVENOUS | Status: DC
  Administered 2015-10-09: 4700 mg via INTRAVENOUS
  Filled 2015-10-09: qty 94

## 2015-10-09 MED ORDER — DEXTROSE 5 % IV SOLN
Freq: Once | INTRAVENOUS | Status: AC
  Administered 2015-10-09: 10:00:00 via INTRAVENOUS
  Filled 2015-10-09: qty 1000

## 2015-10-09 MED ORDER — HEPARIN SOD (PORK) LOCK FLUSH 100 UNIT/ML IV SOLN
500.0000 [IU] | Freq: Once | INTRAVENOUS | Status: DC | PRN
  Filled 2015-10-09: qty 5

## 2015-10-09 NOTE — Progress Notes (Signed)
Patient states he has abdominal pain for the past week. More in the lower abdomen.  If he presses on his abdomen it gets worse.  Also states he was taking omeprazole and is out.  Wants to know if he should continue with OTC?

## 2015-10-09 NOTE — Progress Notes (Signed)
Neutrophils =1.3.  MD ok to proceed with treatment

## 2015-10-11 ENCOUNTER — Inpatient Hospital Stay: Payer: 59

## 2015-10-11 VITALS — BP 116/76 | HR 65 | Resp 18

## 2015-10-11 DIAGNOSIS — C168 Malignant neoplasm of overlapping sites of stomach: Secondary | ICD-10-CM

## 2015-10-11 MED ORDER — HEPARIN SOD (PORK) LOCK FLUSH 100 UNIT/ML IV SOLN
INTRAVENOUS | Status: AC
  Filled 2015-10-11: qty 5

## 2015-10-11 MED ORDER — HEPARIN SOD (PORK) LOCK FLUSH 100 UNIT/ML IV SOLN
500.0000 [IU] | Freq: Once | INTRAVENOUS | Status: AC | PRN
  Administered 2015-10-11: 500 [IU]

## 2015-10-11 MED ORDER — SODIUM CHLORIDE 0.9% FLUSH
10.0000 mL | INTRAVENOUS | Status: DC | PRN
  Administered 2015-10-11: 10 mL
  Filled 2015-10-11: qty 10

## 2015-10-11 MED ORDER — PEGFILGRASTIM INJECTION 6 MG/0.6ML ~~LOC~~
6.0000 mg | PREFILLED_SYRINGE | Freq: Once | SUBCUTANEOUS | Status: AC
  Administered 2015-10-11: 6 mg via SUBCUTANEOUS

## 2015-10-11 MED ORDER — PEGFILGRASTIM INJECTION 6 MG/0.6ML ~~LOC~~
PREFILLED_SYRINGE | SUBCUTANEOUS | Status: AC
  Filled 2015-10-11: qty 0.6

## 2015-10-23 ENCOUNTER — Other Ambulatory Visit: Payer: Self-pay | Admitting: Hematology and Oncology

## 2015-10-23 ENCOUNTER — Other Ambulatory Visit: Payer: Self-pay

## 2015-10-23 ENCOUNTER — Inpatient Hospital Stay: Payer: 59

## 2015-10-23 ENCOUNTER — Other Ambulatory Visit: Payer: Self-pay | Admitting: *Deleted

## 2015-10-23 ENCOUNTER — Encounter: Payer: Self-pay | Admitting: Hematology and Oncology

## 2015-10-23 ENCOUNTER — Inpatient Hospital Stay (HOSPITAL_BASED_OUTPATIENT_CLINIC_OR_DEPARTMENT_OTHER): Payer: 59 | Admitting: Hematology and Oncology

## 2015-10-23 VITALS — BP 119/78 | HR 63 | Temp 97.8°F | Resp 18 | Wt 183.9 lb

## 2015-10-23 DIAGNOSIS — C169 Malignant neoplasm of stomach, unspecified: Secondary | ICD-10-CM

## 2015-10-23 DIAGNOSIS — C168 Malignant neoplasm of overlapping sites of stomach: Secondary | ICD-10-CM

## 2015-10-23 DIAGNOSIS — Z7689 Persons encountering health services in other specified circumstances: Secondary | ICD-10-CM

## 2015-10-23 DIAGNOSIS — Z1509 Genetic susceptibility to other malignant neoplasm: Secondary | ICD-10-CM

## 2015-10-23 DIAGNOSIS — Z79899 Other long term (current) drug therapy: Secondary | ICD-10-CM

## 2015-10-23 DIAGNOSIS — C801 Malignant (primary) neoplasm, unspecified: Principal | ICD-10-CM

## 2015-10-23 DIAGNOSIS — R718 Other abnormality of red blood cells: Secondary | ICD-10-CM

## 2015-10-23 DIAGNOSIS — E876 Hypokalemia: Secondary | ICD-10-CM

## 2015-10-23 DIAGNOSIS — D696 Thrombocytopenia, unspecified: Secondary | ICD-10-CM | POA: Diagnosis not present

## 2015-10-23 DIAGNOSIS — C786 Secondary malignant neoplasm of retroperitoneum and peritoneum: Secondary | ICD-10-CM

## 2015-10-23 DIAGNOSIS — K219 Gastro-esophageal reflux disease without esophagitis: Secondary | ICD-10-CM

## 2015-10-23 DIAGNOSIS — D509 Iron deficiency anemia, unspecified: Secondary | ICD-10-CM

## 2015-10-23 DIAGNOSIS — C165 Malignant neoplasm of lesser curvature of stomach, unspecified: Secondary | ICD-10-CM | POA: Diagnosis not present

## 2015-10-23 DIAGNOSIS — B9681 Helicobacter pylori [H. pylori] as the cause of diseases classified elsewhere: Secondary | ICD-10-CM

## 2015-10-23 DIAGNOSIS — R109 Unspecified abdominal pain: Secondary | ICD-10-CM

## 2015-10-23 DIAGNOSIS — K2970 Gastritis, unspecified, without bleeding: Secondary | ICD-10-CM

## 2015-10-23 DIAGNOSIS — K257 Chronic gastric ulcer without hemorrhage or perforation: Secondary | ICD-10-CM

## 2015-10-23 LAB — CBC WITH DIFFERENTIAL/PLATELET
Basophils Absolute: 0.1 10*3/uL (ref 0–0.1)
Basophils Relative: 1 %
Eosinophils Absolute: 0.1 10*3/uL (ref 0–0.7)
Eosinophils Relative: 1 %
HCT: 42.8 % (ref 40.0–52.0)
Hemoglobin: 14.7 g/dL (ref 13.0–18.0)
Lymphocytes Relative: 31 %
Lymphs Abs: 2.3 10*3/uL (ref 1.0–3.6)
MCH: 27 pg (ref 26.0–34.0)
MCHC: 34.3 g/dL (ref 32.0–36.0)
MCV: 78.9 fL — ABNORMAL LOW (ref 80.0–100.0)
Monocytes Absolute: 0.6 10*3/uL (ref 0.2–1.0)
Monocytes Relative: 8 %
Neutro Abs: 4.4 10*3/uL (ref 1.4–6.5)
Neutrophils Relative %: 59 %
Platelets: 87 10*3/uL — ABNORMAL LOW (ref 150–440)
RBC: 5.42 MIL/uL (ref 4.40–5.90)
RDW: 20.2 % — ABNORMAL HIGH (ref 11.5–14.5)
WBC: 7.5 10*3/uL (ref 3.8–10.6)

## 2015-10-23 LAB — COMPREHENSIVE METABOLIC PANEL
ALT: 23 U/L (ref 17–63)
AST: 34 U/L (ref 15–41)
Albumin: 3.9 g/dL (ref 3.5–5.0)
Alkaline Phosphatase: 114 U/L (ref 38–126)
Anion gap: 6 (ref 5–15)
BUN: 10 mg/dL (ref 6–20)
CO2: 25 mmol/L (ref 22–32)
Calcium: 8.7 mg/dL — ABNORMAL LOW (ref 8.9–10.3)
Chloride: 105 mmol/L (ref 101–111)
Creatinine, Ser: 0.76 mg/dL (ref 0.61–1.24)
GFR calc Af Amer: >60 mL/min (ref >60)
GFR calc non Af Amer: >60 mL/min (ref >60)
Glucose, Bld: 141 mg/dL — ABNORMAL HIGH (ref 65–99)
Potassium: 3.7 mmol/L (ref 3.5–5.1)
Sodium: 136 mmol/L (ref 135–145)
Total Bilirubin: 0.6 mg/dL (ref 0.3–1.2)
Total Protein: 7.3 g/dL (ref 6.5–8.1)

## 2015-10-23 LAB — MAGNESIUM: Magnesium: 2 mg/dL (ref 1.7–2.4)

## 2015-10-23 MED ORDER — HEPARIN SOD (PORK) LOCK FLUSH 100 UNIT/ML IV SOLN
500.0000 [IU] | Freq: Once | INTRAVENOUS | Status: AC
  Administered 2015-10-23: 500 [IU] via INTRAVENOUS
  Filled 2015-10-23: qty 5

## 2015-10-23 MED ORDER — SODIUM CHLORIDE 0.9% FLUSH
10.0000 mL | Freq: Once | INTRAVENOUS | Status: AC
  Administered 2015-10-23: 10 mL via INTRAVENOUS
  Filled 2015-10-23: qty 10

## 2015-10-23 NOTE — Progress Notes (Signed)
[facility] day:  10/23/15  Chief Complaint: Anthony Melendez is a 35 y.o. male with metastatic gastric cancer who is seen for assessment prior to cycle #6 FOLFOX chemotherapy.  HPI:  The patient was last seen on 10/09/2015.  At that time, restaging studies were reviewed.  Disease was responding well.  He had abdominal discomfort after running out of his omeprazole.  ANC was borderline.  He received cycle #5 FOLFOX chemotherapy with Nuelasta support.  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  After he received his last chemotherapy, he felt tired but this went away. He feels a lot better today. He continues to have stomach pain.  He denies any melena or hematochezia.   Past Medical History:  Diagnosis Date  . Gastric cancer ([place]) 08/2015   Folfox chemo tx's.  Marland Kitchen GERD (gastroesophageal reflux disease)     Past Surgical History:  Procedure Laterality Date  . ESOPHAGOGASTRODUODENOSCOPY (EGD) WITH PROPOFOL N/A 08/02/2015   Procedure: ESOPHAGOGASTRODUODENOSCOPY (EGD) WITH PROPOFOL;  Surgeon: Lucilla Lame, MD;  Location: [facility];  Service: Endoscopy;  Laterality: N/A;  . NO PAST SURGERIES    . PERIPHERAL VASCULAR CATHETERIZATION N/A 08/13/2015   Procedure: Glori Luis Cath Insertion;  Surgeon: Algernon Huxley, MD;  Location: [facility];  Service: Cardiovascular;  Laterality: N/A;    History reviewed. No pertinent family history.  Social History:  reports that he has never smoked. He has never used smokeless tobacco. He reports that he does not drink alcohol or use drugs.  The patient is from [place].  He speaks [place].  He moves to the [place] in 2000.  He lives in [place] with his father, mother, brother and sister.  He is a Theme park manager.  The patient is accompanied by his mother and the  interpreter today.  Allergies: No Known Allergies  Current Medications: Current Outpatient Prescriptions  Medication Sig Dispense Refill  . ferrous sulfate 325 (65 FE) MG EC tablet Take 1 tablet (325 mg total) by mouth daily with breakfast. 30 tablet 3  . omeprazole (PRILOSEC) 20 MG capsule Take 1 capsule (20 mg total) by mouth 2 (two) times daily before a meal. 60 capsule 2  . potassium chloride (K-DUR) 10 MEQ tablet Take 1 daily or as directed by dr Mike Gip 30 tablet 1  . prochlorperazine (COMPAZINE) 10 MG tablet Take 1 tablet (10 mg total) by mouth every 6 (six) hours as needed for nausea or vomiting. 30 tablet 1   No current facility-administered medications for this visit.    Facility-Administered Medications Ordered in Other Visits  Medication Dose Route Frequency Provider Last Rate Last Dose  . sodium chloride flush (NS) 0.9 % injection 10 mL  10 mL Intravenous PRN Lequita Asal, MD   10 mL at 08/28/15 3382    Review of Systems:  GENERAL:  Feels better today.  No fevers or sweats.  Weight up 4 pounds. PERFORMANCE STATUS (ECOG):  1 HEENT:  No visual changes, runny nose, sore throat, mouth sores or tenderness. Lungs: No shortness of breath or cough.  No hemoptysis. Cardiac:  No chest pain, palpitations, orthopnea, or PND. GI:   Abdominal pain, improved.  No vomiting, diarrhea, constipation, melena or hematochezia. GU:  No urgency, frequency, dysuria, or hematuria. Musculoskeletal:  Little back pain.  No muscle tenderness. Extremities:  No pain or swelling. Skin:  No rashes or skin changes. Neuro:  No headache, numbness or weakness, balance or coordination issues. Endocrine:  No diabetes, thyroid issues, hot flashes or night sweats. Psych:  No mood changes, depression or anxiety. Pain:  Abdominal discomfort, improved. Review of systems:  All other systems reviewed and found to be negative.  Physical Exam: Blood pressure 119/78, pulse 63, temperature 97.8 F (36.6 C),  temperature source Tympanic, resp. rate 18, weight 183 lb 13.8 oz (83.4 kg). GENERAL:  Well developed, well nourished, gentleman sitting comfortably in the exam room in no acute distress. MENTAL STATUS:  Alert and oriented to person, place and time. HEAD:  Wearing an [employer] cap.  Black hair.  Thin mustache.  Normocephalic, atraumatic, face symmetric, no Cushingoid features. EYES:  Brown eyes.  No conjunctivitis or scleral icterus. ENT:  Oropharynx clear without lesion.  Tongue normal. Mucous membranes moist.  RESPIRATORY:  Clear to auscultation without rales, wheezes or rhonchi. CARDIOVASCULAR:  Regular rate and rhythm without murmur, rub or gallop. ABDOMEN:  Soft, non-tender in the epigastric region.  No guarding or rebound tenderness.  Active bowel sounds and no hepatosplenomegaly.  No masses. SKIN:  No rashes, ulcers or lesions. EXTREMITIES: No edema, no skin discoloration or tenderness.  No palpable cords. LYMPH NODES: No palpable cervical, supraclavicular, axillary or inguinal adenopathy  NEUROLOGICAL: Unremarkable. PSYCH:  Appropriate.  NEUROLOGICAL: Appropriate.   Orders Only on 10/23/2015  Component Date Value Ref Range Status  . WBC 10/23/2015 7.5  3.8 - 10.6 K/uL Final  . RBC 10/23/2015 5.42  4.40 - 5.90 MIL/uL Final  . Hemoglobin 10/23/2015 14.7  13.0 - 18.0 g/dL Final  . HCT 10/23/2015 42.8  40.0 - 52.0 % Final  . MCV 10/23/2015 78.9* 80.0 - 100.0 fL Final  . MCH 10/23/2015 27.0  26.0 - 34.0 pg Final  . MCHC 10/23/2015 34.3  32.0 - 36.0 g/dL Final  . RDW 10/23/2015 20.2* 11.5 - 14.5 % Final  . Platelets 10/23/2015 87* 150 - 440 K/uL Final  . Neutrophils Relative % 10/23/2015 59%  % Final  . Neutro Abs 10/23/2015 4.4  1.4 - 6.5 K/uL Final  . Lymphocytes Relative 10/23/2015 31%  % Final  . Lymphs Abs 10/23/2015 2.3  1.0 - 3.6 K/uL Final  . Monocytes Relative 10/23/2015 8%  % Final  . Monocytes Absolute 10/23/2015 0.6  0.2 - 1.0 K/uL Final  . Eosinophils Relative  10/23/2015 1%  % Final  . Eosinophils Absolute 10/23/2015 0.1  0 - 0.7 K/uL Final  . Basophils Relative 10/23/2015 1%  % Final  . Basophils Absolute 10/23/2015 0.1  0 - 0.1 K/uL Final  . Sodium 10/23/2015 136  135 - 145 mmol/L Final  . Potassium 10/23/2015 3.7  3.5 - 5.1 mmol/L Final  . Chloride 10/23/2015 105  101 - 111 mmol/L Final  . CO2 10/23/2015 25  22 - 32 mmol/L Final  . Glucose, Bld 10/23/2015 141* 65 - 99 mg/dL Final  . BUN 10/23/2015 10  6 - 20 mg/dL Final  . Creatinine, Ser 10/23/2015 0.76  0.61 - 1.24 mg/dL Final  . Calcium 10/23/2015 8.7* 8.9 - 10.3 mg/dL Final  . Total Protein 10/23/2015 7.3  6.5 - 8.1 g/dL Final  . Albumin 10/23/2015 3.9  3.5 - 5.0 g/dL Final  . AST 10/23/2015 34  15 - 41 U/L Final  . ALT 10/23/2015 23  17 - 63 U/L Final  . Alkaline Phosphatase 10/23/2015 114  38 - 126 U/L Final  . Total Bilirubin 10/23/2015  0.6  0.3 - 1.2 mg/dL Final  . GFR calc non Af Amer 10/23/2015 >60  >60 mL/min Final  . GFR calc Af Amer 10/23/2015 >60  >60 mL/min Final   Comment: (NOTE) The eGFR has been calculated using the CKD EPI equation. This calculation has not been validated in all clinical situations. eGFR's persistently <60 mL/min signify possible Chronic Kidney Disease.   . Anion gap 10/23/2015 6  5 - 15 Final  . Magnesium 10/23/2015 2.0  1.7 - 2.4 mg/dL Final    Assessment:  Anthony Melendez is a 35 y.o. male with metastatic gastric cancer.  He presented with a 4-6 week history of progressive epigastric pain.  EGD on 08/02/2015 revealed a large, ulcerated, non-circumferential mass with oozing and stigmata of recent bleeding in the entire examined stomach.  The mass involved the fundus down to the antrum.  Biopsies revealed poorly differentiated adenocarcinoma.  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  Her2/neu testing was negative.  Microsatellite testing was negative.  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  Chest, abdomen and pelvic CT scan on 07/29/2015 revealed a 4 cm thickened and irregular gastric wall compatible with an infiltrative malignancy. There was extensive involvement of the upper mesentery and omentum compatible with peritoneal carcinomatosis. There was a small amount of ascites.   Head CT on 08/08/2015 revealed no evidence of metastatic disease.  He completed treatment for H pylori  (began 08/09/2015).  He has iron deficiency anemia likely gastric oozing and modest diet.  RBC are microcytic.  Labs on 08/21/2015 revealed a ferritin was 44 with an iron saturation 4% and a TIBC of 395.  He is s/p 5 cycles of FOLFOX chemotherapy (08/14/2015 - 10/09/2015).  Abdomen and pelvic CT scan on 10/05/2015 revealed decreased size of the primary mass along the lesser curvature of the stomach, decrease extension of the mass into the gastrohepatic ligament, decreasing evidence of peritoneal carcinomatosis involving predominantly the omentum, and resolution of previously noted malignant ascites. There was a potential ulcer along the greater curvature of the mid body of the stomach.  Abdomen and pelvic CT scan on 10/05/2015 revealed a positive response to therapy with decreased size of the primary mass along the lesser curvature of the stomach, decrease extension of the mass into the gastrohepatic ligament, decreasing evidence of peritoneal carcinomatosis involving predominantly the omentum, and resolution of previously noted malignant ascites. There was a potential ulcer along the greater curvature of the mid body of the stomach.  He has tolerated his chemotherapy well without diarrhea, mouth sores, or neuropathy.  Symptomatically, he has some abdominal pain likely due to his gastric ulcer.  He is on omeprazole.  He has thrombocytopenia (platelet count 87,000).  Plan: 1.  Labs today:   CBC with diff, CMP, Mg. 2.  Discuss thrombocytopenia and need to postpone therapy 3.  Discuss Invitae genetic testing. 4.  Postpone cycle #6 FOLFOX 5.  Review scans with Dr. Allen Norris re: ulcer- done. 6.  RTC in 1 week for MD assessment, labs (CBC with diff, CMP, Mg) and cycle #6 FOLFOX.   Lequita Asal, MD  10/23/2015, 9:06 AM

## 2015-10-23 NOTE — Progress Notes (Signed)
Patient accompanied by interpreter today.  Patient states when he had his last chemo he was told he would be given medication for his ulcer.  He never heard anything else about it.  States last Thursday - Saturday he experienced pain in his mid abdomen.  Pain has now resolved.  Also complains of burning pain in his back at times.  Patient has no pain today.

## 2015-10-28 DIAGNOSIS — Z1509 Genetic susceptibility to other malignant neoplasm: Secondary | ICD-10-CM | POA: Insufficient documentation

## 2015-10-28 DIAGNOSIS — K257 Chronic gastric ulcer without hemorrhage or perforation: Secondary | ICD-10-CM | POA: Insufficient documentation

## 2015-10-29 NOTE — Progress Notes (Signed)
[facility] Center  Clinic day:  10/30/15  Chief Complaint: Anthony Melendez is a 35 y.o. male with metastatic gastric cancer who is seen for assessment prior to cycle #6 FOLFOX chemotherapy.  HPI:  The patient was last seen on 10/23/2015.  At that time, chemotherapy was held secondary to thrombocytopenia (platlet count 87,000). He had some stomach pain felt secondary to his gastric ulcer.  He was on omeprazole BID.  He denied any melena or hematochezia.  During the interim, he has done well. He has gone to [place] to eat and dance.   He has a little bit of fatigue in his arms and legs. His back hurts with sitting.  He still has some abdominal discomfort.   Past Medical History:  Diagnosis Date  . Gastric cancer ([facility]) 08/2015   Folfox chemo tx's.  Marland Kitchen GERD (gastroesophageal reflux disease)     Past Surgical History:  Procedure Laterality Date  . ESOPHAGOGASTRODUODENOSCOPY (EGD) WITH PROPOFOL N/A 08/02/2015   Procedure: ESOPHAGOGASTRODUODENOSCOPY (EGD) WITH PROPOFOL;  Surgeon: Midge Minium, MD;  Location: [facility];  Service: Endoscopy;  Laterality: N/A;  . NO PAST SURGERIES    . PERIPHERAL VASCULAR CATHETERIZATION N/A 08/13/2015   Procedure: Shelda Pal Cath Insertion;  Surgeon: Annice Needy, MD;  Location: [facility];  Service: Cardiovascular;  Laterality: N/A;    History reviewed. No pertinent family history.  Social History:  reports that he has never smoked. He has never used smokeless tobacco. He reports that he does not drink alcohol or use drugs.  The patient is from [place].  He speaks [place].  He moves to the [place] in 2000.  He lives in [place] with his father, mother, brother and sister.  He is a Designer, fashion/clothing.  The patient is accompanied by his father, his brother, and the interpreter today.  Allergies: No Known Allergies  Current Medications: Current Outpatient Prescriptions  Medication Sig Dispense Refill  . ferrous sulfate  325 (65 FE) MG EC tablet Take 1 tablet (325 mg total) by mouth daily with breakfast. 30 tablet 3  . omeprazole (PRILOSEC) 20 MG capsule Take 1 capsule (20 mg total) by mouth 2 (two) times daily before a meal. 60 capsule 2  . potassium chloride (K-DUR) 10 MEQ tablet Take 1 daily or as directed by dr Merlene Pulling 30 tablet 1  . prochlorperazine (COMPAZINE) 10 MG tablet Take 1 tablet (10 mg total) by mouth every 6 (six) hours as needed for nausea or vomiting. 30 tablet 1   No current facility-administered medications for this visit.    Facility-Administered Medications Ordered in Other Visits  Medication Dose Route Frequency Provider Last Rate Last Dose  . sodium chloride flush (NS) 0.9 % injection 10 mL  10 mL Intravenous PRN Rosey Bath, MD   10 mL at 08/28/15 9980    Review of Systems:  GENERAL:  Feels good.  No fevers or sweats.  Weight up 5 pounds. PERFORMANCE STATUS (ECOG):  1 HEENT:  No visual changes, runny nose, sore throat, mouth sores or tenderness. Lungs: No shortness of breath or cough.  No hemoptysis. Cardiac:  No chest pain, palpitations, orthopnea, or PND. GI:   Abdominal discomfort.  No vomiting, diarrhea, constipation, melena or hematochezia. GU:  No urgency, frequency, dysuria, or hematuria. Musculoskeletal:  Little back pain with sitting.  No muscle tenderness. Extremities:  No pain or swelling. Skin:  No rashes or skin changes. Neuro:  No headache, numbness or weakness, balance or coordination  issues. Endocrine:  No diabetes, thyroid issues, hot flashes or night sweats. Psych:  No mood changes, depression or anxiety. Pain:  Abdominal discomfort, improved. Review of systems:  All other systems reviewed and found to be negative.  Physical Exam: Blood pressure 134/89, pulse 67, temperature (!) 96.6 F (35.9 C), temperature source Tympanic, resp. rate 18, weight 188 lb 0.8 oz (85.3 kg). GENERAL:  Well developed, well nourished, gentleman sitting comfortably in the  exam room in no acute distress. MENTAL STATUS:  Alert and oriented to person, place and time. HEAD:  Wearing a blue [employer] cap.  Black hair.  Thin mustache.  Normocephalic, atraumatic, face symmetric, no Cushingoid features. EYES:  Brown eyes.  No conjunctivitis or scleral icterus. ENT:  Oropharynx clear without lesion.  Tongue normal. Mucous membranes moist.  RESPIRATORY:  Clear to auscultation without rales, wheezes or rhonchi. CARDIOVASCULAR:  Regular rate and rhythm without murmur, rub or gallop. ABDOMEN:  Soft, slightly tender in the epigastric region.  No guarding or rebound tenderness.  Active bowel sounds and no hepatosplenomegaly.  No masses. SKIN:  No rashes, ulcers or lesions. EXTREMITIES: No edema, no skin discoloration or tenderness.  No palpable cords. LYMPH NODES: No palpable cervical, supraclavicular, axillary or inguinal adenopathy  NEUROLOGICAL: Unremarkable. PSYCH:  Appropriate.  NEUROLOGICAL: Appropriate.   Appointment on 10/30/2015  Component Date Value Ref Range Status  . WBC 10/30/2015 5.9  3.8 - 10.6 K/uL Final  . RBC 10/30/2015 5.36  4.40 - 5.90 MIL/uL Final  . Hemoglobin 10/30/2015 14.5  13.0 - 18.0 g/dL Final  . HCT 10/30/2015 42.5  40.0 - 52.0 % Final  . MCV 10/30/2015 79.2* 80.0 - 100.0 fL Final  . MCH 10/30/2015 27.1  26.0 - 34.0 pg Final  . MCHC 10/30/2015 34.3  32.0 - 36.0 g/dL Final  . RDW 10/30/2015 20.8* 11.5 - 14.5 % Final  . Platelets 10/30/2015 157  150 - 440 K/uL Final  . Neutrophils Relative % 10/30/2015 55  % Final  . Neutro Abs 10/30/2015 3.3  1.4 - 6.5 K/uL Final  . Lymphocytes Relative 10/30/2015 32  % Final  . Lymphs Abs 10/30/2015 1.9  1.0 - 3.6 K/uL Final  . Monocytes Relative 10/30/2015 10  % Final  . Monocytes Absolute 10/30/2015 0.6  0.2 - 1.0 K/uL Final  . Eosinophils Relative 10/30/2015 1  % Final  . Eosinophils Absolute 10/30/2015 0.1  0 - 0.7 K/uL Final  . Basophils Relative 10/30/2015 2  % Final  . Basophils Absolute 10/30/2015  0.1  0 - 0.1 K/uL Final  . Sodium 10/30/2015 136  135 - 145 mmol/L Final  . Potassium 10/30/2015 3.6  3.5 - 5.1 mmol/L Final  . Chloride 10/30/2015 104  101 - 111 mmol/L Final  . CO2 10/30/2015 26  22 - 32 mmol/L Final  . Glucose, Bld 10/30/2015 184* 65 - 99 mg/dL Final  . BUN 10/30/2015 14  6 - 20 mg/dL Final  . Creatinine, Ser 10/30/2015 0.73  0.61 - 1.24 mg/dL Final  . Calcium 10/30/2015 8.9  8.9 - 10.3 mg/dL Final  . Total Protein 10/30/2015 7.2  6.5 - 8.1 g/dL Final  . Albumin 10/30/2015 4.0  3.5 - 5.0 g/dL Final  . AST 10/30/2015 30  15 - 41 U/L Final  . ALT 10/30/2015 21  17 - 63 U/L Final  . Alkaline Phosphatase 10/30/2015 92  38 - 126 U/L Final  . Total Bilirubin 10/30/2015 0.5  0.3 - 1.2 mg/dL Final  . GFR calc  non Af Amer 10/30/2015 >60  >60 mL/min Final  . GFR calc Af Amer 10/30/2015 >60  >60 mL/min Final   Comment: (NOTE) The eGFR has been calculated using the CKD EPI equation. This calculation has not been validated in all clinical situations. eGFR's persistently <60 mL/min signify possible Chronic Kidney Disease.   . Anion gap 10/30/2015 6  5 - 15 Final  . Magnesium 10/30/2015 2.0  1.7 - 2.4 mg/dL Final    Assessment:  Anthony Melendez is a 35 y.o. male with metastatic gastric cancer.  He presented with a 4-6 week history of progressive epigastric pain.  EGD on 08/02/2015 revealed a large, ulcerated, non-circumferential mass with oozing and stigmata of recent bleeding in the entire examined stomach.  The mass involved the fundus down to the antrum.  Biopsies revealed poorly differentiated adenocarcinoma.  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  Her2/neu testing was negative.  Microsatellite testing was negative.  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  Chest, abdomen and pelvic CT scan on  07/29/2015 revealed a 4 cm thickened and irregular gastric wall compatible with an infiltrative malignancy. There was extensive involvement of the upper mesentery and omentum compatible with peritoneal carcinomatosis. There was a small amount of ascites.   Head CT on 08/08/2015 revealed no evidence of metastatic disease.  He completed treatment for H pylori  (began 08/09/2015).  He has iron deficiency anemia likely gastric oozing and modest diet.  RBC are microcytic.  Labs on 08/21/2015 revealed a ferritin was 44 with an iron saturation 4% and a TIBC of 395.  He is s/p 5 cycles of FOLFOX chemotherapy (08/14/2015 - 10/09/2015).  Chemotherapy was held on 10/23/2015 secondary to thrombocytopenia (87,000).  Abdomen and pelvic CT scan on 10/05/2015 revealed a positive response to therapy with decreased size of the primary mass along the lesser curvature of the stomach, decrease extension of the mass into the gastrohepatic ligament, decreasing evidence of peritoneal carcinomatosis involving predominantly the omentum, and resolution of previously noted malignant ascites. There was a potential ulcer along the greater curvature of the mid body of the stomach.  He has tolerated his chemotherapy well without diarrhea, mouth sores, or neuropathy.  Symptomatically, he has mild chronic epigastric pain likely due to his gastric ulcer.  He is on omeprazole.  Platelet count is normal.  Plan: 1.  Labs today:  CBC with diff, CMP, Mg. 2.  Cycle #6 FOLFOX. 3.  RTC in 2 days for disconnect and Neulasta. 4.  Collect stool for H pylori (patient to be off omeprazole). 5.  Rx:  Carafate 1 gm 4 times a day  6.  RTC in 2 weeks for MD assessment, labs (CBC with diff, CMP, Mg) and cycle #7 FOLFOX.   Lequita Asal, MD  10/30/2015, 10:12 AM

## 2015-10-30 ENCOUNTER — Inpatient Hospital Stay: Payer: 59

## 2015-10-30 ENCOUNTER — Other Ambulatory Visit: Payer: Self-pay | Admitting: *Deleted

## 2015-10-30 ENCOUNTER — Encounter: Payer: Self-pay | Admitting: *Deleted

## 2015-10-30 ENCOUNTER — Encounter: Payer: Self-pay | Admitting: Hematology and Oncology

## 2015-10-30 ENCOUNTER — Inpatient Hospital Stay (HOSPITAL_BASED_OUTPATIENT_CLINIC_OR_DEPARTMENT_OTHER): Payer: 59 | Admitting: Hematology and Oncology

## 2015-10-30 VITALS — BP 134/89 | HR 67 | Temp 96.6°F | Resp 18 | Wt 188.1 lb

## 2015-10-30 DIAGNOSIS — C169 Malignant neoplasm of stomach, unspecified: Secondary | ICD-10-CM

## 2015-10-30 DIAGNOSIS — C786 Secondary malignant neoplasm of retroperitoneum and peritoneum: Secondary | ICD-10-CM

## 2015-10-30 DIAGNOSIS — R718 Other abnormality of red blood cells: Secondary | ICD-10-CM

## 2015-10-30 DIAGNOSIS — B9681 Helicobacter pylori [H. pylori] as the cause of diseases classified elsewhere: Secondary | ICD-10-CM

## 2015-10-30 DIAGNOSIS — C165 Malignant neoplasm of lesser curvature of stomach, unspecified: Secondary | ICD-10-CM | POA: Diagnosis not present

## 2015-10-30 DIAGNOSIS — Z7689 Persons encountering health services in other specified circumstances: Secondary | ICD-10-CM

## 2015-10-30 DIAGNOSIS — K259 Gastric ulcer, unspecified as acute or chronic, without hemorrhage or perforation: Secondary | ICD-10-CM

## 2015-10-30 DIAGNOSIS — R109 Unspecified abdominal pain: Secondary | ICD-10-CM

## 2015-10-30 DIAGNOSIS — K219 Gastro-esophageal reflux disease without esophagitis: Secondary | ICD-10-CM

## 2015-10-30 DIAGNOSIS — C801 Malignant (primary) neoplasm, unspecified: Secondary | ICD-10-CM

## 2015-10-30 DIAGNOSIS — D696 Thrombocytopenia, unspecified: Secondary | ICD-10-CM

## 2015-10-30 DIAGNOSIS — Z1509 Genetic susceptibility to other malignant neoplasm: Secondary | ICD-10-CM

## 2015-10-30 DIAGNOSIS — A048 Other specified bacterial intestinal infections: Secondary | ICD-10-CM

## 2015-10-30 DIAGNOSIS — R5383 Other fatigue: Secondary | ICD-10-CM | POA: Diagnosis not present

## 2015-10-30 DIAGNOSIS — K257 Chronic gastric ulcer without hemorrhage or perforation: Secondary | ICD-10-CM

## 2015-10-30 DIAGNOSIS — C168 Malignant neoplasm of overlapping sites of stomach: Secondary | ICD-10-CM

## 2015-10-30 DIAGNOSIS — D509 Iron deficiency anemia, unspecified: Secondary | ICD-10-CM

## 2015-10-30 DIAGNOSIS — K2970 Gastritis, unspecified, without bleeding: Secondary | ICD-10-CM

## 2015-10-30 DIAGNOSIS — Z79899 Other long term (current) drug therapy: Secondary | ICD-10-CM

## 2015-10-30 DIAGNOSIS — E876 Hypokalemia: Secondary | ICD-10-CM

## 2015-10-30 LAB — COMPREHENSIVE METABOLIC PANEL
ALT: 21 U/L (ref 17–63)
AST: 30 U/L (ref 15–41)
Albumin: 4 g/dL (ref 3.5–5.0)
Alkaline Phosphatase: 92 U/L (ref 38–126)
Anion gap: 6 (ref 5–15)
BUN: 14 mg/dL (ref 6–20)
CO2: 26 mmol/L (ref 22–32)
Calcium: 8.9 mg/dL (ref 8.9–10.3)
Chloride: 104 mmol/L (ref 101–111)
Creatinine, Ser: 0.73 mg/dL (ref 0.61–1.24)
GFR calc Af Amer: >60 mL/min (ref >60)
GFR calc non Af Amer: >60 mL/min (ref >60)
Glucose, Bld: 184 mg/dL — ABNORMAL HIGH (ref 65–99)
Potassium: 3.6 mmol/L (ref 3.5–5.1)
Sodium: 136 mmol/L (ref 135–145)
Total Bilirubin: 0.5 mg/dL (ref 0.3–1.2)
Total Protein: 7.2 g/dL (ref 6.5–8.1)

## 2015-10-30 LAB — CBC WITH DIFFERENTIAL/PLATELET
Basophils Absolute: 0.1 10*3/uL (ref 0–0.1)
Basophils Relative: 2 %
Eosinophils Absolute: 0.1 10*3/uL (ref 0–0.7)
Eosinophils Relative: 1 %
HCT: 42.5 % (ref 40.0–52.0)
Hemoglobin: 14.5 g/dL (ref 13.0–18.0)
Lymphocytes Relative: 32 %
Lymphs Abs: 1.9 10*3/uL (ref 1.0–3.6)
MCH: 27.1 pg (ref 26.0–34.0)
MCHC: 34.3 g/dL (ref 32.0–36.0)
MCV: 79.2 fL — ABNORMAL LOW (ref 80.0–100.0)
Monocytes Absolute: 0.6 10*3/uL (ref 0.2–1.0)
Monocytes Relative: 10 %
Neutro Abs: 3.3 10*3/uL (ref 1.4–6.5)
Neutrophils Relative %: 55 %
Platelets: 157 10*3/uL (ref 150–440)
RBC: 5.36 MIL/uL (ref 4.40–5.90)
RDW: 20.8 % — ABNORMAL HIGH (ref 11.5–14.5)
WBC: 5.9 10*3/uL (ref 3.8–10.6)

## 2015-10-30 LAB — MAGNESIUM: Magnesium: 2 mg/dL (ref 1.7–2.4)

## 2015-10-30 MED ORDER — SODIUM CHLORIDE 0.9 % IV SOLN
10.0000 mg | Freq: Once | INTRAVENOUS | Status: AC
  Administered 2015-10-30: 10 mg via INTRAVENOUS
  Filled 2015-10-30: qty 1

## 2015-10-30 MED ORDER — DEXTROSE 5 % IV SOLN
Freq: Once | INTRAVENOUS | Status: AC
  Administered 2015-10-30: 11:00:00 via INTRAVENOUS
  Filled 2015-10-30: qty 1000

## 2015-10-30 MED ORDER — SODIUM CHLORIDE 0.9 % IV SOLN
2400.0000 mg/m2 | INTRAVENOUS | Status: DC
  Administered 2015-10-30: 4700 mg via INTRAVENOUS
  Filled 2015-10-30: qty 94

## 2015-10-30 MED ORDER — SUCRALFATE 1 G PO TABS: 1.0000 g | ORAL_TABLET | Freq: Three times a day (TID) | ORAL | 0 refills | Status: DC

## 2015-10-30 MED ORDER — SODIUM CHLORIDE 0.9% FLUSH
10.0000 mL | INTRAVENOUS | Status: DC | PRN
  Administered 2015-10-30: 10 mL
  Filled 2015-10-30: qty 10

## 2015-10-30 MED ORDER — PALONOSETRON HCL INJECTION 0.25 MG/5ML
0.2500 mg | Freq: Once | INTRAVENOUS | Status: AC
  Administered 2015-10-30: 0.25 mg via INTRAVENOUS
  Filled 2015-10-30: qty 5

## 2015-10-30 MED ORDER — OXALIPLATIN CHEMO INJECTION 100 MG/20ML
85.0000 mg/m2 | Freq: Once | INTRAVENOUS | Status: AC
  Administered 2015-10-30: 165 mg via INTRAVENOUS
  Filled 2015-10-30: qty 33

## 2015-10-30 MED ORDER — FLUOROURACIL CHEMO INJECTION 2.5 GM/50ML
400.0000 mg/m2 | Freq: Once | INTRAVENOUS | Status: AC
  Administered 2015-10-30: 800 mg via INTRAVENOUS
  Filled 2015-10-30: qty 16

## 2015-10-30 MED ORDER — LEUCOVORIN CALCIUM INJECTION 350 MG
800.0000 mg | Freq: Once | INTRAVENOUS | Status: AC
  Administered 2015-10-30: 800 mg via INTRAVENOUS
  Filled 2015-10-30: qty 40

## 2015-10-30 NOTE — Progress Notes (Signed)
Patient is here for follow up, he complains of burning in his back, he had back pain the day after his last visit. He declines flu shot  He is asking about the medication you were going to the GI doctor about. He has not gotten them. Mentions his eye get red and burn.

## 2015-11-01 ENCOUNTER — Other Ambulatory Visit: Payer: Self-pay | Admitting: *Deleted

## 2015-11-01 ENCOUNTER — Inpatient Hospital Stay: Payer: 59

## 2015-11-01 DIAGNOSIS — K257 Chronic gastric ulcer without hemorrhage or perforation: Secondary | ICD-10-CM

## 2015-11-01 DIAGNOSIS — C168 Malignant neoplasm of overlapping sites of stomach: Secondary | ICD-10-CM

## 2015-11-01 DIAGNOSIS — A048 Other specified bacterial intestinal infections: Secondary | ICD-10-CM

## 2015-11-01 MED ORDER — HEPARIN SOD (PORK) LOCK FLUSH 100 UNIT/ML IV SOLN
500.0000 [IU] | Freq: Once | INTRAVENOUS | Status: AC | PRN
  Administered 2015-11-01: 500 [IU]

## 2015-11-01 MED ORDER — HEPARIN SOD (PORK) LOCK FLUSH 100 UNIT/ML IV SOLN
INTRAVENOUS | Status: AC
  Filled 2015-11-01: qty 5

## 2015-11-01 MED ORDER — PEGFILGRASTIM INJECTION 6 MG/0.6ML ~~LOC~~
6.0000 mg | PREFILLED_SYRINGE | Freq: Once | SUBCUTANEOUS | Status: AC
  Administered 2015-11-01: 6 mg via SUBCUTANEOUS
  Filled 2015-11-01: qty 0.6

## 2015-11-04 LAB — H. PYLORI ANTIGEN, STOOL: H. Pylori Stool Ag, Eia: NEGATIVE

## 2015-11-13 ENCOUNTER — Other Ambulatory Visit: Payer: Self-pay | Admitting: Hematology and Oncology

## 2015-11-13 ENCOUNTER — Inpatient Hospital Stay: Payer: 59

## 2015-11-13 ENCOUNTER — Inpatient Hospital Stay: Payer: 59 | Attending: Hematology and Oncology

## 2015-11-13 ENCOUNTER — Encounter: Payer: Self-pay | Admitting: Hematology and Oncology

## 2015-11-13 ENCOUNTER — Inpatient Hospital Stay (HOSPITAL_BASED_OUTPATIENT_CLINIC_OR_DEPARTMENT_OTHER): Payer: 59 | Admitting: Hematology and Oncology

## 2015-11-13 VITALS — BP 128/87 | HR 73 | Temp 97.9°F | Resp 18 | Wt 188.1 lb

## 2015-11-13 DIAGNOSIS — C786 Secondary malignant neoplasm of retroperitoneum and peritoneum: Secondary | ICD-10-CM | POA: Diagnosis not present

## 2015-11-13 DIAGNOSIS — M545 Low back pain, unspecified: Secondary | ICD-10-CM

## 2015-11-13 DIAGNOSIS — C801 Malignant (primary) neoplasm, unspecified: Secondary | ICD-10-CM

## 2015-11-13 DIAGNOSIS — Z5111 Encounter for antineoplastic chemotherapy: Secondary | ICD-10-CM | POA: Insufficient documentation

## 2015-11-13 DIAGNOSIS — K2970 Gastritis, unspecified, without bleeding: Secondary | ICD-10-CM

## 2015-11-13 DIAGNOSIS — K219 Gastro-esophageal reflux disease without esophagitis: Secondary | ICD-10-CM

## 2015-11-13 DIAGNOSIS — D696 Thrombocytopenia, unspecified: Secondary | ICD-10-CM

## 2015-11-13 DIAGNOSIS — D509 Iron deficiency anemia, unspecified: Secondary | ICD-10-CM

## 2015-11-13 DIAGNOSIS — C169 Malignant neoplasm of stomach, unspecified: Secondary | ICD-10-CM

## 2015-11-13 DIAGNOSIS — C168 Malignant neoplasm of overlapping sites of stomach: Secondary | ICD-10-CM

## 2015-11-13 DIAGNOSIS — B9681 Helicobacter pylori [H. pylori] as the cause of diseases classified elsewhere: Secondary | ICD-10-CM

## 2015-11-13 DIAGNOSIS — R188 Other ascites: Secondary | ICD-10-CM

## 2015-11-13 DIAGNOSIS — M2550 Pain in unspecified joint: Secondary | ICD-10-CM | POA: Insufficient documentation

## 2015-11-13 DIAGNOSIS — Z7689 Persons encountering health services in other specified circumstances: Secondary | ICD-10-CM | POA: Insufficient documentation

## 2015-11-13 DIAGNOSIS — Z79899 Other long term (current) drug therapy: Secondary | ICD-10-CM | POA: Insufficient documentation

## 2015-11-13 DIAGNOSIS — M549 Dorsalgia, unspecified: Secondary | ICD-10-CM

## 2015-11-13 DIAGNOSIS — L299 Pruritus, unspecified: Secondary | ICD-10-CM | POA: Insufficient documentation

## 2015-11-13 DIAGNOSIS — E876 Hypokalemia: Secondary | ICD-10-CM

## 2015-11-13 DIAGNOSIS — R718 Other abnormality of red blood cells: Secondary | ICD-10-CM

## 2015-11-13 LAB — MAGNESIUM: Magnesium: 1.9 mg/dL (ref 1.7–2.4)

## 2015-11-13 LAB — CBC WITH DIFFERENTIAL/PLATELET
Basophils Absolute: 0.1 10*3/uL (ref 0–0.1)
Basophils Relative: 1 %
Eosinophils Absolute: 0.1 10*3/uL (ref 0–0.7)
Eosinophils Relative: 1 %
HCT: 43.9 % (ref 40.0–52.0)
Hemoglobin: 15.2 g/dL (ref 13.0–18.0)
Lymphocytes Relative: 21 %
Lymphs Abs: 1.9 10*3/uL (ref 1.0–3.6)
MCH: 27.9 pg (ref 26.0–34.0)
MCHC: 34.6 g/dL (ref 32.0–36.0)
MCV: 80.5 fL (ref 80.0–100.0)
Monocytes Absolute: 0.7 10*3/uL (ref 0.2–1.0)
Monocytes Relative: 8 %
Neutro Abs: 6.4 10*3/uL (ref 1.4–6.5)
Neutrophils Relative %: 69 %
Platelets: 102 10*3/uL — ABNORMAL LOW (ref 150–440)
RBC: 5.45 MIL/uL (ref 4.40–5.90)
RDW: 23.2 % — ABNORMAL HIGH (ref 11.5–14.5)
WBC: 9.3 10*3/uL (ref 3.8–10.6)

## 2015-11-13 LAB — COMPREHENSIVE METABOLIC PANEL
ALT: 22 U/L (ref 17–63)
AST: 34 U/L (ref 15–41)
Albumin: 4.1 g/dL (ref 3.5–5.0)
Alkaline Phosphatase: 153 U/L — ABNORMAL HIGH (ref 38–126)
Anion gap: 10 (ref 5–15)
BUN: 8 mg/dL (ref 6–20)
CO2: 23 mmol/L (ref 22–32)
Calcium: 8.8 mg/dL — ABNORMAL LOW (ref 8.9–10.3)
Chloride: 102 mmol/L (ref 101–111)
Creatinine, Ser: 0.83 mg/dL (ref 0.61–1.24)
GFR calc Af Amer: >60 mL/min (ref >60)
GFR calc non Af Amer: >60 mL/min (ref >60)
Glucose, Bld: 211 mg/dL — ABNORMAL HIGH (ref 65–99)
Potassium: 3.8 mmol/L (ref 3.5–5.1)
Sodium: 135 mmol/L (ref 135–145)
Total Bilirubin: 0.6 mg/dL (ref 0.3–1.2)
Total Protein: 7.4 g/dL (ref 6.5–8.1)

## 2015-11-13 MED ORDER — SODIUM CHLORIDE 0.9% FLUSH
10.0000 mL | INTRAVENOUS | Status: DC | PRN
  Administered 2015-11-13: 10 mL
  Filled 2015-11-13: qty 10

## 2015-11-13 MED ORDER — PALONOSETRON HCL INJECTION 0.25 MG/5ML
0.2500 mg | Freq: Once | INTRAVENOUS | Status: AC
  Administered 2015-11-13: 0.25 mg via INTRAVENOUS
  Filled 2015-11-13: qty 5

## 2015-11-13 MED ORDER — OXALIPLATIN CHEMO INJECTION 100 MG/20ML
85.0000 mg/m2 | Freq: Once | INTRAVENOUS | Status: AC
  Administered 2015-11-13: 165 mg via INTRAVENOUS
  Filled 2015-11-13: qty 33

## 2015-11-13 MED ORDER — HYDROCODONE-ACETAMINOPHEN 5-325 MG PO TABS: 1.0000 | ORAL_TABLET | Freq: Four times a day (QID) | ORAL | 0 refills | Status: DC | PRN

## 2015-11-13 MED ORDER — DEXAMETHASONE SODIUM PHOSPHATE 10 MG/ML IJ SOLN
10.0000 mg | Freq: Once | INTRAMUSCULAR | Status: AC
  Administered 2015-11-13: 10 mg via INTRAVENOUS

## 2015-11-13 MED ORDER — DEXTROSE 5 % IV SOLN
800.0000 mg | Freq: Once | INTRAVENOUS | Status: AC
  Administered 2015-11-13: 800 mg via INTRAVENOUS
  Filled 2015-11-13: qty 35

## 2015-11-13 MED ORDER — DEXAMETHASONE SODIUM PHOSPHATE 10 MG/ML IJ SOLN: 10.0000 mg | Freq: Once | INTRAMUSCULAR | Status: DC

## 2015-11-13 MED ORDER — FLUOROURACIL CHEMO INJECTION 2.5 GM/50ML
400.0000 mg/m2 | Freq: Once | INTRAVENOUS | Status: AC
  Administered 2015-11-13: 800 mg via INTRAVENOUS
  Filled 2015-11-13: qty 16

## 2015-11-13 MED ORDER — HEPARIN SOD (PORK) LOCK FLUSH 100 UNIT/ML IV SOLN: 500.0000 [IU] | Freq: Once | INTRAVENOUS | Status: DC | PRN

## 2015-11-13 MED ORDER — SODIUM CHLORIDE 0.9 % IV SOLN
2400.0000 mg/m2 | INTRAVENOUS | Status: DC
  Administered 2015-11-13: 4700 mg via INTRAVENOUS
  Filled 2015-11-13: qty 94

## 2015-11-13 MED ORDER — DEXAMETHASONE SODIUM PHOSPHATE 100 MG/10ML IJ SOLN
10.0000 mg | Freq: Once | INTRAMUSCULAR | Status: DC
  Filled 2015-11-13: qty 1

## 2015-11-13 MED ORDER — DEXTROSE 5 % IV SOLN
Freq: Once | INTRAVENOUS | Status: AC
  Administered 2015-11-13: 10:00:00 via INTRAVENOUS
  Filled 2015-11-13: qty 1000

## 2015-11-13 NOTE — Progress Notes (Signed)
Patient states after eating he feels bloated. C/o burning pain in his back when lying down.  Today he woke up with pain in his shoulders.  Also requesting refill for omeprazole.  States his abdomen is tender to touch.  Otherwise no pain in his stomach.  On Saturday he started having pain in joints of knees and elbows.  Further states he developed a sore throat on Sunday.

## 2015-11-13 NOTE — Progress Notes (Signed)
[facility] day:  11/13/15  Chief Complaint: Anthony Melendez is a 35 y.o. male with metastatic gastric cancer who is seen for assessment prior to cycle #7 FOLFOX chemotherapy.  HPI:  The patient was last seen on 10/30/2015.  At that time, he had mild chronic epigastric pain likely due to his gastric ulcer.  He was on omeprazole.  Platelet count was normal.  He received cycle #6 FOLFOX.  He was prescribed Carafate.  Stool for H pylori was negative.  Symptomatically, he has done well.  He denies any neuropathy.  He denies any abdominal pain or discomfort.  He feels bloated at times.  He eats small frequent meals.  He notes some discomfort in his knees, elbows, and back for the past 2 days.  Pain is similar to prior cycles, but more noticeable.  He also notes some discomfort in neck which radiates to shoulders.   Past Medical History:  Diagnosis Date  . Gastric cancer ([place]) 08/2015   Folfox chemo tx's.  Marland Kitchen GERD (gastroesophageal reflux disease)     Past Surgical History:  Procedure Laterality Date  . ESOPHAGOGASTRODUODENOSCOPY (EGD) WITH PROPOFOL N/A 08/02/2015   Procedure: ESOPHAGOGASTRODUODENOSCOPY (EGD) WITH PROPOFOL;  Surgeon: Lucilla Lame, MD;  Location: [facility];  Service: Endoscopy;  Laterality: N/A;  . NO PAST SURGERIES    . PERIPHERAL VASCULAR CATHETERIZATION N/A 08/13/2015   Procedure: Glori Luis Cath Insertion;  Surgeon: Algernon Huxley, MD;  Location: [facility];  Service: Cardiovascular;  Laterality: N/A;    No family history on file.  Social History:  reports that he has never smoked. He has never used smokeless tobacco. He reports that he does not drink alcohol or use drugs.  The patient is from [place].  He speaks [place].  He moves to the [place] in 2000.  He lives in [place] with his father, mother, brother and sister.  He is a Theme park manager.  The patient is accompanied by his his sister, Verdene Lennert, and the interpreter  today.  Allergies: No Known Allergies  Current Medications: Current Outpatient Prescriptions  Medication Sig Dispense Refill  . ferrous sulfate 325 (65 FE) MG EC tablet Take 1 tablet (325 mg total) by mouth daily with breakfast. 30 tablet 3  . omeprazole (PRILOSEC) 20 MG capsule Take 1 capsule (20 mg total) by mouth 2 (two) times daily before a meal. 60 capsule 2  . potassium chloride (K-DUR) 10 MEQ tablet Take 1 daily or as directed by dr Mike Gip 30 tablet 1  . prochlorperazine (COMPAZINE) 10 MG tablet Take 1 tablet (10 mg total) by mouth every 6 (six) hours as needed for nausea or vomiting. 30 tablet 1  . sucralfate (CARAFATE) 1 g tablet Take 1 tablet (1 g total) by mouth 4 (four) times daily -  with meals and at bedtime. 60 tablet 0  . HYDROcodone-acetaminophen (NORCO) 5-325 MG tablet Take 1 tablet by mouth every 6 (six) hours as needed for moderate pain. 20 tablet 0   Current Facility-Administered Medications  Medication Dose Route Frequency Provider Last Rate Last Dose  . dexamethasone (DECADRON) injection 10 mg  10 mg Intravenous Once Lequita Asal, MD      . dexamethasone (DECADRON) injection 10 mg  10 mg Intravenous Once Lequita Asal, MD       Facility-Administered Medications Ordered in Other Visits  Medication Dose Route Frequency Provider Last Rate Last Dose  . fluorouracil (ADRUCIL) 4,700 mg in sodium chloride 0.9 %  56 mL chemo infusion  2,400 mg/m2 (Treatment Plan Recorded) Intravenous 1 day or 1 dose Lequita Asal, MD   4,700 mg at 11/13/15 1346  . sodium chloride flush (NS) 0.9 % injection 10 mL  10 mL Intravenous PRN Lequita Asal, MD   10 mL at 08/28/15 0835  . sodium chloride flush (NS) 0.9 % injection 10 mL  10 mL Intracatheter PRN Lequita Asal, MD   10 mL at 11/13/15 6269    Review of Systems:  GENERAL:  Feels good.  No fevers or sweats.  Weight stable. PERFORMANCE STATUS (ECOG):  1 HEENT:  No visual changes, runny nose, sore throat, mouth  sores or tenderness. Lungs:  No shortness of breath or cough.  No hemoptysis. Cardiac:  No chest pain, palpitations, orthopnea, or PND. GI:   No abdominal pain.  Some bloating.  Eating small frequent meals.  No vomiting, diarrhea, constipation, melena or hematochezia. GU:  No urgency, frequency, dysuria, or hematuria. Musculoskeletal:  Elbow, knee, and back discomfort x 2 days.  Mild neck discomfort.  No muscle tenderness. Extremities:  No pain or swelling. Skin:  No rashes or skin changes. Neuro:  No headache, numbness or weakness, balance or coordination issues. Endocrine:  No diabetes, thyroid issues, hot flashes or night sweats. Psych:  No mood changes, depression or anxiety. Pain:  Back discomfort. Review of systems:  All other systems reviewed and found to be negative.  Physical Exam: Blood pressure 128/87, pulse 73, temperature 97.9 F (36.6 C), temperature source Tympanic, resp. rate 18, weight 188 lb 0.8 oz (85.3 kg). GENERAL:  Well developed, well nourished, gentleman sitting comfortably in the exam room in no acute distress. MENTAL STATUS:  Alert and oriented to person, place and time. HEAD:  Wearing cap.  Black hair.  Thin mustache.  Normocephalic, atraumatic, face symmetric, no Cushingoid features. EYES:  Brown eyes.  No conjunctivitis or scleral icterus. ENT:  Oropharynx clear without lesion.  Tongue normal. Mucous membranes moist.  RESPIRATORY:  Clear to auscultation without rales, wheezes or rhonchi. CARDIOVASCULAR:  Regular rate and rhythm without murmur, rub or gallop. ABDOMEN:  Soft, slightly tender in the epigastric region.  No guarding or rebound tenderness.  Active bowel sounds and no hepatosplenomegaly.  No masses. BACK:  No pain on palpation.   SKIN:  No rashes, ulcers or lesions. EXTREMITIES: No edema, no skin discoloration or tenderness.  No palpable cords. LYMPH NODES: No palpable cervical, supraclavicular, axillary or inguinal adenopathy  NEUROLOGICAL:  Unremarkable. PSYCH:  Appropriate.  NEUROLOGICAL: Appropriate.   Infusion on 11/13/2015  Component Date Value Ref Range Status  . WBC 11/13/2015 9.3  3.8 - 10.6 K/uL Final  . RBC 11/13/2015 5.45  4.40 - 5.90 MIL/uL Final  . Hemoglobin 11/13/2015 15.2  13.0 - 18.0 g/dL Final  . HCT 11/13/2015 43.9  40.0 - 52.0 % Final  . MCV 11/13/2015 80.5  80.0 - 100.0 fL Final  . MCH 11/13/2015 27.9  26.0 - 34.0 pg Final  . MCHC 11/13/2015 34.6  32.0 - 36.0 g/dL Final  . RDW 11/13/2015 23.2* 11.5 - 14.5 % Final  . Platelets 11/13/2015 102* 150 - 440 K/uL Final  . Neutrophils Relative % 11/13/2015 69  % Final  . Neutro Abs 11/13/2015 6.4  1.4 - 6.5 K/uL Final  . Lymphocytes Relative 11/13/2015 21  % Final  . Lymphs Abs 11/13/2015 1.9  1.0 - 3.6 K/uL Final  . Monocytes Relative 11/13/2015 8  % Final  . Monocytes Absolute  11/13/2015 0.7  0.2 - 1.0 K/uL Final  . Eosinophils Relative 11/13/2015 1  % Final  . Eosinophils Absolute 11/13/2015 0.1  0 - 0.7 K/uL Final  . Basophils Relative 11/13/2015 1  % Final  . Basophils Absolute 11/13/2015 0.1  0 - 0.1 K/uL Final  . Sodium 11/13/2015 135  135 - 145 mmol/L Final  . Potassium 11/13/2015 3.8  3.5 - 5.1 mmol/L Final  . Chloride 11/13/2015 102  101 - 111 mmol/L Final  . CO2 11/13/2015 23  22 - 32 mmol/L Final  . Glucose, Bld 11/13/2015 211* 65 - 99 mg/dL Final  . BUN 11/13/2015 8  6 - 20 mg/dL Final  . Creatinine, Ser 11/13/2015 0.83  0.61 - 1.24 mg/dL Final  . Calcium 11/13/2015 8.8* 8.9 - 10.3 mg/dL Final  . Total Protein 11/13/2015 7.4  6.5 - 8.1 g/dL Final  . Albumin 11/13/2015 4.1  3.5 - 5.0 g/dL Final  . AST 11/13/2015 34  15 - 41 U/L Final  . ALT 11/13/2015 22  17 - 63 U/L Final  . Alkaline Phosphatase 11/13/2015 153* 38 - 126 U/L Final  . Total Bilirubin 11/13/2015 0.6  0.3 - 1.2 mg/dL Final  . GFR calc non Af Amer 11/13/2015 >60  >60 mL/min Final  . GFR calc Af Amer 11/13/2015 >60  >60 mL/min Final   Comment: (NOTE) The eGFR has been  calculated using the CKD EPI equation. This calculation has not been validated in all clinical situations. eGFR's persistently <60 mL/min signify possible Chronic Kidney Disease.   . Anion gap 11/13/2015 10  5 - 15 Final  . Magnesium 11/13/2015 1.9  1.7 - 2.4 mg/dL Final    Assessment:  Jahson Emanuele is a 35 y.o. male with metastatic gastric cancer.  He presented with a 4-6 week history of progressive epigastric pain.  EGD on 08/02/2015 revealed a large, ulcerated, non-circumferential mass with oozing and stigmata of recent bleeding in the entire examined stomach.  The mass involved the fundus down to the antrum.  Biopsies revealed poorly differentiated adenocarcinoma.  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  Her2/neu testing was negative.  Microsatellite testing was negative.  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  Chest, abdomen and pelvic CT scan on 07/29/2015 revealed a 4 cm thickened and irregular gastric wall compatible with an infiltrative malignancy. There was extensive involvement of the upper mesentery and omentum compatible with peritoneal carcinomatosis. There was a small amount of ascites.   Head CT on 08/08/2015 revealed no evidence of metastatic disease.  He completed treatment for H pylori  (began 08/09/2015).  Stool was negative for H pylori on 11/01/2015.  He has iron deficiency anemia likely gastric oozing and modest diet.  RBC are microcytic.  Labs on 08/21/2015 revealed a ferritin was 44 with an iron saturation 4% and a TIBC of 395.  He is s/p 6 cycles of FOLFOX chemotherapy (08/14/2015 - 10/30/2015).  Chemotherapy was held on 10/23/2015 secondary to thrombocytopenia (87,000).  He has tolerated his chemotherapy well without diarrhea, mouth sores, or neuropathy.  Abdomen and pelvic CT scan on  10/05/2015 revealed a positive response to therapy with decreased size of the primary mass along the lesser curvature of the stomach, decrease extension of the mass into the gastrohepatic ligament, decreasing evidence of peritoneal carcinomatosis involving predominantly the omentum, and resolution of previously noted malignant ascites. There  was a potential ulcer along the greater curvature of the mid body of the stomach.  Symptomatically, he has some mild back and joint pain possibly related to Neulasta.  Exam is normal.  WBC is normal.  Plan: 1.  Labs today:  CBC with diff, CMP, Mg. 2.  Discuss bone/joint symptoms.  Suspect secondary to Neulasta (doubt metastatic disease).  Alkaline phosphatase slightly elevated (often seen with Neulasta).  Discuss excellent WBC count and plan for no Neulasta with this cycle.  Discuss neutropenic precautions.  Patient to contact office if any fever or concerns.  Discuss platelet count (adequate > 100,00). 3.  Cycle #7 FOLFOX. 4.  RTC in 2 days for disconnect and Neulasta. 5.  Rx:  Lortab 5/325 1 tablet po q 6 hours prn pain; dis: #20.  Patient not to drive or operate heavy machinery. 6.  RTC in 2 weeks for MD assessment, labs (CBC with diff, CMP, Mg) and cycle #8 FOLFOX.   Lequita Asal, MD  11/13/2015, 4:30 PM

## 2015-11-15 ENCOUNTER — Inpatient Hospital Stay: Payer: 59

## 2015-11-15 ENCOUNTER — Other Ambulatory Visit: Payer: Self-pay | Admitting: *Deleted

## 2015-11-15 VITALS — BP 116/76 | HR 68 | Temp 97.3°F | Resp 18

## 2015-11-15 DIAGNOSIS — C169 Malignant neoplasm of stomach, unspecified: Secondary | ICD-10-CM

## 2015-11-15 DIAGNOSIS — C168 Malignant neoplasm of overlapping sites of stomach: Secondary | ICD-10-CM

## 2015-11-15 MED ORDER — SODIUM CHLORIDE 0.9% FLUSH
10.0000 mL | INTRAVENOUS | Status: DC | PRN
Start: 2015-11-15 — End: 2015-11-15
  Administered 2015-11-15: 10 mL
  Filled 2015-11-15: qty 10

## 2015-11-15 MED ORDER — PEGFILGRASTIM INJECTION 6 MG/0.6ML ~~LOC~~: 6.0000 mg | PREFILLED_SYRINGE | Freq: Once | SUBCUTANEOUS | Status: DC

## 2015-11-15 MED ORDER — HEPARIN SOD (PORK) LOCK FLUSH 100 UNIT/ML IV SOLN
500.0000 [IU] | Freq: Once | INTRAVENOUS | Status: AC | PRN
  Administered 2015-11-15: 500 [IU]
  Filled 2015-11-15: qty 5

## 2015-11-27 ENCOUNTER — Inpatient Hospital Stay: Payer: Self-pay

## 2015-11-27 ENCOUNTER — Other Ambulatory Visit: Payer: Self-pay | Admitting: *Deleted

## 2015-11-27 ENCOUNTER — Inpatient Hospital Stay (HOSPITAL_BASED_OUTPATIENT_CLINIC_OR_DEPARTMENT_OTHER): Payer: 59 | Admitting: Hematology and Oncology

## 2015-11-27 VITALS — BP 114/75 | HR 66 | Temp 97.5°F | Resp 18 | Wt 186.7 lb

## 2015-11-27 DIAGNOSIS — D509 Iron deficiency anemia, unspecified: Secondary | ICD-10-CM

## 2015-11-27 DIAGNOSIS — K219 Gastro-esophageal reflux disease without esophagitis: Secondary | ICD-10-CM

## 2015-11-27 DIAGNOSIS — C169 Malignant neoplasm of stomach, unspecified: Secondary | ICD-10-CM

## 2015-11-27 DIAGNOSIS — Z79899 Other long term (current) drug therapy: Secondary | ICD-10-CM

## 2015-11-27 DIAGNOSIS — C168 Malignant neoplasm of overlapping sites of stomach: Secondary | ICD-10-CM | POA: Diagnosis not present

## 2015-11-27 DIAGNOSIS — C786 Secondary malignant neoplasm of retroperitoneum and peritoneum: Secondary | ICD-10-CM | POA: Diagnosis not present

## 2015-11-27 DIAGNOSIS — M2550 Pain in unspecified joint: Secondary | ICD-10-CM

## 2015-11-27 DIAGNOSIS — L299 Pruritus, unspecified: Secondary | ICD-10-CM

## 2015-11-27 DIAGNOSIS — M549 Dorsalgia, unspecified: Secondary | ICD-10-CM

## 2015-11-27 DIAGNOSIS — B9681 Helicobacter pylori [H. pylori] as the cause of diseases classified elsewhere: Secondary | ICD-10-CM

## 2015-11-27 DIAGNOSIS — Z5111 Encounter for antineoplastic chemotherapy: Secondary | ICD-10-CM

## 2015-11-27 DIAGNOSIS — C801 Malignant (primary) neoplasm, unspecified: Secondary | ICD-10-CM

## 2015-11-27 DIAGNOSIS — Z7689 Persons encountering health services in other specified circumstances: Secondary | ICD-10-CM | POA: Diagnosis not present

## 2015-11-27 DIAGNOSIS — R718 Other abnormality of red blood cells: Secondary | ICD-10-CM

## 2015-11-27 DIAGNOSIS — E876 Hypokalemia: Secondary | ICD-10-CM

## 2015-11-27 DIAGNOSIS — K2970 Gastritis, unspecified, without bleeding: Secondary | ICD-10-CM

## 2015-11-27 DIAGNOSIS — D696 Thrombocytopenia, unspecified: Secondary | ICD-10-CM

## 2015-11-27 DIAGNOSIS — R188 Other ascites: Secondary | ICD-10-CM

## 2015-11-27 LAB — CBC WITH DIFFERENTIAL/PLATELET
Basophils Absolute: 0 10*3/uL (ref 0–0.1)
Basophils Relative: 1 %
Eosinophils Absolute: 0.1 10*3/uL (ref 0–0.7)
Eosinophils Relative: 2 %
HCT: 41.4 % (ref 40.0–52.0)
Hemoglobin: 14.4 g/dL (ref 13.0–18.0)
Lymphocytes Relative: 38 %
Lymphs Abs: 1.2 10*3/uL (ref 1.0–3.6)
MCH: 28 pg (ref 26.0–34.0)
MCHC: 34.8 g/dL (ref 32.0–36.0)
MCV: 80.5 fL (ref 80.0–100.0)
Monocytes Absolute: 0.5 10*3/uL (ref 0.2–1.0)
Monocytes Relative: 15 %
Neutro Abs: 1.4 10*3/uL (ref 1.4–6.5)
Neutrophils Relative %: 44 %
Platelets: 106 10*3/uL — ABNORMAL LOW (ref 150–440)
RBC: 5.15 MIL/uL (ref 4.40–5.90)
RDW: 23 % — ABNORMAL HIGH (ref 11.5–14.5)
WBC: 3.3 10*3/uL — ABNORMAL LOW (ref 3.8–10.6)

## 2015-11-27 LAB — COMPREHENSIVE METABOLIC PANEL
ALT: 25 U/L (ref 17–63)
AST: 35 U/L (ref 15–41)
Albumin: 4 g/dL (ref 3.5–5.0)
Alkaline Phosphatase: 93 U/L (ref 38–126)
Anion gap: 8 (ref 5–15)
BUN: 9 mg/dL (ref 6–20)
CO2: 23 mmol/L (ref 22–32)
Calcium: 8.5 mg/dL — ABNORMAL LOW (ref 8.9–10.3)
Chloride: 105 mmol/L (ref 101–111)
Creatinine, Ser: 0.62 mg/dL (ref 0.61–1.24)
GFR calc Af Amer: >60 mL/min (ref >60)
GFR calc non Af Amer: >60 mL/min (ref >60)
Glucose, Bld: 177 mg/dL — ABNORMAL HIGH (ref 65–99)
Potassium: 3.6 mmol/L (ref 3.5–5.1)
Sodium: 136 mmol/L (ref 135–145)
Total Bilirubin: 0.6 mg/dL (ref 0.3–1.2)
Total Protein: 7.4 g/dL (ref 6.5–8.1)

## 2015-11-27 LAB — MAGNESIUM: Magnesium: 2 mg/dL (ref 1.7–2.4)

## 2015-11-27 MED ORDER — SODIUM CHLORIDE 0.9 % IV SOLN
2400.0000 mg/m2 | INTRAVENOUS | Status: AC
  Administered 2015-11-27: 4700 mg via INTRAVENOUS
  Filled 2015-11-27: qty 94

## 2015-11-27 MED ORDER — LEUCOVORIN CALCIUM INJECTION 350 MG
800.0000 mg | Freq: Once | INTRAMUSCULAR | Status: AC
  Administered 2015-11-27: 800 mg via INTRAVENOUS
  Filled 2015-11-27: qty 35

## 2015-11-27 MED ORDER — HEPARIN SOD (PORK) LOCK FLUSH 100 UNIT/ML IV SOLN: 500.0000 [IU] | Freq: Once | INTRAVENOUS | Status: AC

## 2015-11-27 MED ORDER — SODIUM CHLORIDE 0.9 % IJ SOLN
10.0000 mL | Freq: Once | INTRAMUSCULAR | Status: AC
Start: 2015-11-27 — End: ?
  Filled 2015-11-27: qty 10

## 2015-11-27 MED ORDER — HEPARIN SOD (PORK) LOCK FLUSH 100 UNIT/ML IV SOLN
500.0000 [IU] | Freq: Once | INTRAVENOUS | Status: DC | PRN
  Filled 2015-11-27: qty 5

## 2015-11-27 MED ORDER — DEXAMETHASONE SODIUM PHOSPHATE 10 MG/ML IJ SOLN
10.0000 mg | Freq: Once | INTRAMUSCULAR | Status: AC
  Administered 2015-11-27: 10 mg via INTRAVENOUS
  Filled 2015-11-27: qty 1

## 2015-11-27 MED ORDER — DEXTROSE 5 % IV SOLN
Freq: Once | INTRAVENOUS | Status: AC
  Administered 2015-11-27: 10:00:00 via INTRAVENOUS
  Filled 2015-11-27: qty 1000

## 2015-11-27 MED ORDER — FLUOROURACIL CHEMO INJECTION 2.5 GM/50ML
400.0000 mg/m2 | Freq: Once | INTRAVENOUS | Status: AC
  Administered 2015-11-27: 800 mg via INTRAVENOUS
  Filled 2015-11-27: qty 16

## 2015-11-27 MED ORDER — SODIUM CHLORIDE 0.9 % IV SOLN: 10.0000 mg | Freq: Once | INTRAVENOUS | Status: DC

## 2015-11-27 MED ORDER — SODIUM CHLORIDE 0.9% FLUSH
10.0000 mL | INTRAVENOUS | Status: DC | PRN
  Filled 2015-11-27: qty 10

## 2015-11-27 MED ORDER — OXALIPLATIN CHEMO INJECTION 100 MG/20ML
85.0000 mg/m2 | Freq: Once | INTRAVENOUS | Status: AC
  Administered 2015-11-27: 165 mg via INTRAVENOUS
  Filled 2015-11-27: qty 33

## 2015-11-27 MED ORDER — PALONOSETRON HCL INJECTION 0.25 MG/5ML
0.2500 mg | Freq: Once | INTRAVENOUS | Status: AC
  Administered 2015-11-27: 0.25 mg via INTRAVENOUS
  Filled 2015-11-27: qty 5

## 2015-11-27 NOTE — Progress Notes (Signed)
Patient states he has had a sore throat on the left side for about a week.  Has pain when he swallows food on that side.  Also states his right ear has been hurting.  Also states he is out of omeprazole and wants to know if he should continue?

## 2015-11-27 NOTE — Progress Notes (Signed)
[facility] day:  11/27/15  Chief Complaint: Anthony Melendez is a 35 y.o. male with metastatic gastric cancer who is seen for assessment prior to cycle #8 FOLFOX chemotherapy.  HPI:  The patient was last seen on 11/13/2015.  At that time, he had some mild back and joint pain possibly related to Neulasta.  Exam was normal.  WBC was normal.  He received cycle #7 FOLFOX without Neulasta.  Symptomatically, he notes a little itching and a sticking sensation in his abdomen periodically. Bowel movements have been normal without diarrhea. He denies any Neulasta induced bone pain.  Sometimes he notes a little tingle in his fingertips.  He denies any symptoms today.  He has not needed any pain medications.   Past Medical History:  Diagnosis Date  . Gastric cancer ([facility]) 08/2015   Folfox chemo tx's.  Marland Kitchen GERD (gastroesophageal reflux disease)     Past Surgical History:  Procedure Laterality Date  . ESOPHAGOGASTRODUODENOSCOPY (EGD) WITH PROPOFOL N/A 08/02/2015   Procedure: ESOPHAGOGASTRODUODENOSCOPY (EGD) WITH PROPOFOL;  Surgeon: Lucilla Lame, MD;  Location: [facility];  Service: Endoscopy;  Laterality: N/A;  . NO PAST SURGERIES    . PERIPHERAL VASCULAR CATHETERIZATION N/A 08/13/2015   Procedure: Glori Luis Cath Insertion;  Surgeon: Algernon Huxley, MD;  Location: [facility];  Service: Cardiovascular;  Laterality: N/A;    No family history on file.  Social History:  reports that he has never smoked. He has never used smokeless tobacco. He reports that he does not drink alcohol or use drugs.  The patient is from [place].  He speaks [place].  He moves to the [place] in 2000.  He lives in [place] with his father, mother, brother and sister.  He is a Theme park manager.  The patient is accompanied by his his sister, Anthony Melendez, and the interpreter today.  Allergies: No Known Allergies  Current Medications: Current Outpatient Prescriptions  Medication Sig  Dispense Refill  . ferrous sulfate 325 (65 FE) MG EC tablet Take 1 tablet (325 mg total) by mouth daily with breakfast. 30 tablet 3  . HYDROcodone-acetaminophen (NORCO) 5-325 MG tablet Take 1 tablet by mouth every 6 (six) hours as needed for moderate pain. 20 tablet 0  . omeprazole (PRILOSEC) 20 MG capsule Take 1 capsule (20 mg total) by mouth 2 (two) times daily before a meal. 60 capsule 2  . potassium chloride (K-DUR) 10 MEQ tablet Take 1 daily or as directed by dr Mike Gip 30 tablet 1  . prochlorperazine (COMPAZINE) 10 MG tablet Take 1 tablet (10 mg total) by mouth every 6 (six) hours as needed for nausea or vomiting. 30 tablet 1  . sucralfate (CARAFATE) 1 g tablet Take 1 tablet (1 g total) by mouth 4 (four) times daily -  with meals and at bedtime. 60 tablet 0   Current Facility-Administered Medications  Medication Dose Route Frequency Provider Last Rate Last Dose  . dexamethasone (DECADRON) injection 10 mg  10 mg Intravenous Once Lequita Asal, MD      . dexamethasone (DECADRON) injection 10 mg  10 mg Intravenous Once Lequita Asal, MD       Facility-Administered Medications Ordered in Other Visits  Medication Dose Route Frequency Provider Last Rate Last Dose  . heparin lock flush 100 unit/mL  500 Units Intravenous Once Lequita Asal, MD      . pegfilgrastim (NEULASTA) injection 6 mg  6 mg Subcutaneous Once Lequita Asal, MD      .  sodium chloride 0.9 % injection 10 mL  10 mL Intravenous Once Lequita Asal, MD      . sodium chloride flush (NS) 0.9 % injection 10 mL  10 mL Intravenous PRN Lequita Asal, MD   10 mL at 08/28/15 0272    Review of Systems:  GENERAL:  Feels good.  No fevers or sweats.  Weight down 2 pounds. PERFORMANCE STATUS (ECOG):  1 HEENT:  Sore throat x 1 week.  No visual changes, runny nose, sore throat, mouth sores or tenderness. Lungs:  No shortness of breath or cough.  No hemoptysis. Cardiac:  No chest pain, palpitations, orthopnea, or  PND. GI:   No abdominal pain.  Intermittent sticking sensation.  No vomiting, diarrhea, constipation, melena or hematochezia. GU:  No urgency, frequency, dysuria, or hematuria. Musculoskeletal:  No bone pain.  No muscle tenderness. Extremities:  No pain or swelling. Skin:  No rashes or skin changes. Neuro:  Transient tingling in fingertips.  No headache, numbness or weakness, balance or coordination issues. Endocrine:  No diabetes, thyroid issues, hot flashes or night sweats. Psych:  No mood changes, depression or anxiety. Pain:  No pain. Review of systems:  All other systems reviewed and found to be negative.  Physical Exam: Blood pressure 114/75, pulse 66, temperature 97.5 F (36.4 C), resp. rate 18, weight 186 lb 11.7 oz (84.7 kg). GENERAL:  Well developed, well nourished, gentleman sitting comfortably in the exam room in no acute distress. MENTAL STATUS:  Alert and oriented to person, place and time. HEAD:  Wearing cap.  Black hair.  Thin mustache.  Normocephalic, atraumatic, face symmetric, no Cushingoid features. EYES:  Brown eyes.  No conjunctivitis or scleral icterus. ENT:  Oropharynx clear without lesion.  No erythema.  Tongue normal. Mucous membranes moist.  RESPIRATORY:  Clear to auscultation without rales, wheezes or rhonchi. CARDIOVASCULAR:  Regular rate and rhythm without murmur, rub or gallop. ABDOMEN:  Soft, non-tender with active bowel sounds and no hepatosplenomegaly.  No masses. SKIN:  No rashes, ulcers or lesions. EXTREMITIES: No edema, no skin discoloration or tenderness.  No palpable cords. LYMPH NODES: No palpable cervical, supraclavicular, axillary or inguinal adenopathy  NEUROLOGICAL: Unremarkable. PSYCH:  Appropriate.  NEUROLOGICAL: Appropriate.   Infusion on 11/27/2015  Component Date Value Ref Range Status  . Sodium 11/27/2015 136  135 - 145 mmol/L Final  . Potassium 11/27/2015 3.6  3.5 - 5.1 mmol/L Final  . Chloride 11/27/2015 105  101 - 111 mmol/L  Final  . CO2 11/27/2015 23  22 - 32 mmol/L Final  . Glucose, Bld 11/27/2015 177* 65 - 99 mg/dL Final  . BUN 11/27/2015 9  6 - 20 mg/dL Final  . Creatinine, Ser 11/27/2015 0.62  0.61 - 1.24 mg/dL Final  . Calcium 11/27/2015 8.5* 8.9 - 10.3 mg/dL Final  . Total Protein 11/27/2015 7.4  6.5 - 8.1 g/dL Final  . Albumin 11/27/2015 4.0  3.5 - 5.0 g/dL Final  . AST 11/27/2015 35  15 - 41 U/L Final  . ALT 11/27/2015 25  17 - 63 U/L Final  . Alkaline Phosphatase 11/27/2015 93  38 - 126 U/L Final  . Total Bilirubin 11/27/2015 0.6  0.3 - 1.2 mg/dL Final  . GFR calc non Af Amer 11/27/2015 >60  >60 mL/min Final  . GFR calc Af Amer 11/27/2015 >60  >60 mL/min Final   Comment: (NOTE) The eGFR has been calculated using the CKD EPI equation. This calculation has not been validated in all clinical situations.  eGFR's persistently <60 mL/min signify possible Chronic Kidney Disease.   . Anion gap 11/27/2015 8  5 - 15 Final  . WBC 11/27/2015 3.3* 3.8 - 10.6 K/uL Final  . RBC 11/27/2015 5.15  4.40 - 5.90 MIL/uL Final  . Hemoglobin 11/27/2015 14.4  13.0 - 18.0 g/dL Final  . HCT 11/27/2015 41.4  40.0 - 52.0 % Final  . MCV 11/27/2015 80.5  80.0 - 100.0 fL Final  . MCH 11/27/2015 28.0  26.0 - 34.0 pg Final  . MCHC 11/27/2015 34.8  32.0 - 36.0 g/dL Final  . RDW 11/27/2015 23.0* 11.5 - 14.5 % Final  . Platelets 11/27/2015 106* 150 - 440 K/uL Final  . Neutrophils Relative % 11/27/2015 44  % Final  . Neutro Abs 11/27/2015 1.4  1.4 - 6.5 K/uL Final  . Lymphocytes Relative 11/27/2015 38  % Final  . Lymphs Abs 11/27/2015 1.2  1.0 - 3.6 K/uL Final  . Monocytes Relative 11/27/2015 15  % Final  . Monocytes Absolute 11/27/2015 0.5  0.2 - 1.0 K/uL Final  . Eosinophils Relative 11/27/2015 2  % Final  . Eosinophils Absolute 11/27/2015 0.1  0 - 0.7 K/uL Final  . Basophils Relative 11/27/2015 1  % Final  . Basophils Absolute 11/27/2015 0.0  0 - 0.1 K/uL Final  . Magnesium 11/27/2015 2.0  1.7 - 2.4 mg/dL Final     Assessment:  Aquilla Voiles is a 35 y.o. male with metastatic gastric cancer.  He presented with a 4-6 week history of progressive epigastric pain.  EGD on 08/02/2015 revealed a large, ulcerated, non-circumferential mass with oozing and stigmata of recent bleeding in the entire examined stomach.  The mass involved the fundus down to the antrum.  Biopsies revealed poorly differentiated adenocarcinoma.  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  Her2/neu testing was negative.  Microsatellite testing was negative.  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  Chest, abdomen and pelvic CT scan on 07/29/2015 revealed a 4 cm thickened and irregular gastric wall compatible with an infiltrative malignancy. There was extensive involvement of the upper mesentery and omentum compatible with peritoneal carcinomatosis. There was a small amount of ascites.   Head CT on 08/08/2015 revealed no evidence of metastatic disease.  He completed treatment for H pylori  (began 08/09/2015).  Stool was negative for H pylori on 11/01/2015.  He has iron deficiency anemia likely gastric oozing and modest diet.  RBC are microcytic.  Labs on 08/21/2015 revealed a ferritin was 44 with an iron saturation 4% and a TIBC of 395.  He is s/p 7 cycles of FOLFOX chemotherapy (08/14/2015 - 11/13/2015).  Chemotherapy was held on 10/23/2015 secondary to thrombocytopenia (87,000).  He has tolerated his chemotherapy well without diarrhea, mouth sores, or neuropathy.  Abdomen and pelvic CT scan on 10/05/2015 revealed a positive response to therapy with decreased size of the primary mass along the lesser curvature of the stomach, decrease extension of the mass into the gastrohepatic ligament, decreasing evidence of peritoneal carcinomatosis involving predominantly the  omentum, and resolution of previously noted malignant ascites. There was a potential ulcer along the greater curvature of the mid body of the stomach.  Symptomatically, he is doing well.  Exam is normal.  He has mild thrombocytopenia (106,000).  Plan: 1.  Labs today:  CBC with diff, CMP, Mg. 2.  Cycle #8 FOLFOX. 3.  RTC in 2  days for disconnect and Neulasta. 4.  RTC in 2 weeks for MD assessment, labs (CBC with diff, CMP, Mg) and cycle #9 FOLFOX.   Lequita Asal, MD  11/27/2015, 9:15 AM

## 2015-11-29 ENCOUNTER — Inpatient Hospital Stay: Payer: 59

## 2015-11-29 VITALS — BP 116/78 | HR 65 | Temp 97.8°F | Resp 20

## 2015-11-29 DIAGNOSIS — C168 Malignant neoplasm of overlapping sites of stomach: Secondary | ICD-10-CM

## 2015-11-29 MED ORDER — SODIUM CHLORIDE 0.9% FLUSH
10.0000 mL | INTRAVENOUS | Status: DC | PRN
  Administered 2015-11-29: 10 mL
  Filled 2015-11-29: qty 10

## 2015-11-29 MED ORDER — HEPARIN SOD (PORK) LOCK FLUSH 100 UNIT/ML IV SOLN
500.0000 [IU] | Freq: Once | INTRAVENOUS | Status: AC | PRN
  Administered 2015-11-29: 500 [IU]
  Filled 2015-11-29: qty 5

## 2015-11-29 MED ORDER — PEGFILGRASTIM INJECTION 6 MG/0.6ML ~~LOC~~
6.0000 mg | PREFILLED_SYRINGE | Freq: Once | SUBCUTANEOUS | Status: AC
  Administered 2015-11-29: 6 mg via SUBCUTANEOUS
  Filled 2015-11-29: qty 0.6

## 2015-12-11 ENCOUNTER — Inpatient Hospital Stay: Payer: 59

## 2015-12-11 ENCOUNTER — Inpatient Hospital Stay: Payer: 59 | Attending: Hematology and Oncology

## 2015-12-11 ENCOUNTER — Other Ambulatory Visit: Payer: Self-pay | Admitting: *Deleted

## 2015-12-11 ENCOUNTER — Other Ambulatory Visit: Payer: Self-pay | Admitting: Hematology and Oncology

## 2015-12-11 ENCOUNTER — Inpatient Hospital Stay (HOSPITAL_BASED_OUTPATIENT_CLINIC_OR_DEPARTMENT_OTHER): Payer: 59 | Admitting: Hematology and Oncology

## 2015-12-11 VITALS — BP 131/87 | HR 66 | Temp 98.3°F | Resp 18 | Wt 184.7 lb

## 2015-12-11 DIAGNOSIS — C786 Secondary malignant neoplasm of retroperitoneum and peritoneum: Secondary | ICD-10-CM | POA: Insufficient documentation

## 2015-12-11 DIAGNOSIS — R109 Unspecified abdominal pain: Secondary | ICD-10-CM

## 2015-12-11 DIAGNOSIS — R202 Paresthesia of skin: Secondary | ICD-10-CM | POA: Insufficient documentation

## 2015-12-11 DIAGNOSIS — D696 Thrombocytopenia, unspecified: Secondary | ICD-10-CM | POA: Insufficient documentation

## 2015-12-11 DIAGNOSIS — Z1509 Genetic susceptibility to other malignant neoplasm: Secondary | ICD-10-CM | POA: Insufficient documentation

## 2015-12-11 DIAGNOSIS — C168 Malignant neoplasm of overlapping sites of stomach: Secondary | ICD-10-CM

## 2015-12-11 DIAGNOSIS — R200 Anesthesia of skin: Secondary | ICD-10-CM | POA: Insufficient documentation

## 2015-12-11 DIAGNOSIS — C801 Malignant (primary) neoplasm, unspecified: Secondary | ICD-10-CM

## 2015-12-11 DIAGNOSIS — C169 Malignant neoplasm of stomach, unspecified: Secondary | ICD-10-CM | POA: Insufficient documentation

## 2015-12-11 DIAGNOSIS — Z5111 Encounter for antineoplastic chemotherapy: Secondary | ICD-10-CM | POA: Insufficient documentation

## 2015-12-11 DIAGNOSIS — K219 Gastro-esophageal reflux disease without esophagitis: Secondary | ICD-10-CM

## 2015-12-11 DIAGNOSIS — D509 Iron deficiency anemia, unspecified: Secondary | ICD-10-CM | POA: Insufficient documentation

## 2015-12-11 DIAGNOSIS — R188 Other ascites: Secondary | ICD-10-CM | POA: Insufficient documentation

## 2015-12-11 DIAGNOSIS — Z79899 Other long term (current) drug therapy: Secondary | ICD-10-CM | POA: Insufficient documentation

## 2015-12-11 DIAGNOSIS — Z7689 Persons encountering health services in other specified circumstances: Secondary | ICD-10-CM | POA: Diagnosis not present

## 2015-12-11 DIAGNOSIS — K5900 Constipation, unspecified: Secondary | ICD-10-CM | POA: Insufficient documentation

## 2015-12-11 LAB — CBC WITH DIFFERENTIAL/PLATELET
Basophils Absolute: 0.1 10*3/uL (ref 0–0.1)
Basophils Relative: 1 %
Eosinophils Absolute: 0.1 10*3/uL (ref 0–0.7)
Eosinophils Relative: 1 %
HCT: 43 % (ref 40.0–52.0)
Hemoglobin: 14.9 g/dL (ref 13.0–18.0)
Lymphocytes Relative: 28 %
Lymphs Abs: 1.8 10*3/uL (ref 1.0–3.6)
MCH: 28.5 pg (ref 26.0–34.0)
MCHC: 34.7 g/dL (ref 32.0–36.0)
MCV: 82.2 fL (ref 80.0–100.0)
Monocytes Absolute: 0.5 10*3/uL (ref 0.2–1.0)
Monocytes Relative: 7 %
Neutro Abs: 4.1 10*3/uL (ref 1.4–6.5)
Neutrophils Relative %: 63 %
Platelets: 112 10*3/uL — ABNORMAL LOW (ref 150–440)
RBC: 5.23 MIL/uL (ref 4.40–5.90)
RDW: 22.2 % — ABNORMAL HIGH (ref 11.5–14.5)
WBC: 6.6 10*3/uL (ref 3.8–10.6)

## 2015-12-11 LAB — COMPREHENSIVE METABOLIC PANEL
ALT: 22 U/L (ref 17–63)
AST: 37 U/L (ref 15–41)
Albumin: 4.1 g/dL (ref 3.5–5.0)
Alkaline Phosphatase: 130 U/L — ABNORMAL HIGH (ref 38–126)
Anion gap: 7 (ref 5–15)
BUN: 9 mg/dL (ref 6–20)
CO2: 24 mmol/L (ref 22–32)
Calcium: 8.7 mg/dL — ABNORMAL LOW (ref 8.9–10.3)
Chloride: 105 mmol/L (ref 101–111)
Creatinine, Ser: 0.67 mg/dL (ref 0.61–1.24)
GFR calc Af Amer: >60 mL/min (ref >60)
GFR calc non Af Amer: >60 mL/min (ref >60)
Glucose, Bld: 194 mg/dL — ABNORMAL HIGH (ref 65–99)
Potassium: 3.8 mmol/L (ref 3.5–5.1)
Sodium: 136 mmol/L (ref 135–145)
Total Bilirubin: 0.5 mg/dL (ref 0.3–1.2)
Total Protein: 7.9 g/dL (ref 6.5–8.1)

## 2015-12-11 LAB — MAGNESIUM: Magnesium: 2.1 mg/dL (ref 1.7–2.4)

## 2015-12-11 MED ORDER — FLUOROURACIL CHEMO INJECTION 2.5 GM/50ML
400.0000 mg/m2 | Freq: Once | INTRAVENOUS | Status: AC
  Administered 2015-12-11: 800 mg via INTRAVENOUS
  Filled 2015-12-11: qty 16

## 2015-12-11 MED ORDER — PALONOSETRON HCL INJECTION 0.25 MG/5ML
0.2500 mg | Freq: Once | INTRAVENOUS | Status: AC
  Administered 2015-12-11: 0.25 mg via INTRAVENOUS
  Filled 2015-12-11: qty 5

## 2015-12-11 MED ORDER — SODIUM CHLORIDE 0.9% FLUSH
10.0000 mL | INTRAVENOUS | Status: DC | PRN
  Administered 2015-12-11: 10 mL
  Filled 2015-12-11: qty 10

## 2015-12-11 MED ORDER — SODIUM CHLORIDE 0.9 % IV SOLN
2400.0000 mg/m2 | INTRAVENOUS | Status: DC
  Administered 2015-12-11: 4700 mg via INTRAVENOUS
  Filled 2015-12-11: qty 94

## 2015-12-11 MED ORDER — OXALIPLATIN CHEMO INJECTION 100 MG/20ML
85.0000 mg/m2 | Freq: Once | INTRAVENOUS | Status: AC
  Administered 2015-12-11: 165 mg via INTRAVENOUS
  Filled 2015-12-11: qty 33

## 2015-12-11 MED ORDER — DEXAMETHASONE SODIUM PHOSPHATE 100 MG/10ML IJ SOLN: 10.0000 mg | Freq: Once | INTRAMUSCULAR | Status: DC

## 2015-12-11 MED ORDER — DEXAMETHASONE SODIUM PHOSPHATE 10 MG/ML IJ SOLN
10.0000 mg | Freq: Once | INTRAMUSCULAR | Status: AC
  Administered 2015-12-11: 10 mg via INTRAVENOUS
  Filled 2015-12-11: qty 1

## 2015-12-11 MED ORDER — LEUCOVORIN CALCIUM INJECTION 350 MG
800.0000 mg | Freq: Once | INTRAVENOUS | Status: AC
  Administered 2015-12-11: 800 mg via INTRAVENOUS
  Filled 2015-12-11: qty 40

## 2015-12-11 MED ORDER — DEXTROSE 5 % IV SOLN
Freq: Once | INTRAVENOUS | Status: AC
  Administered 2015-12-11: 10:00:00 via INTRAVENOUS
  Filled 2015-12-11: qty 1000

## 2015-12-11 NOTE — Progress Notes (Signed)
Patient states he is out of potassium.  If he needs to continue taking will need refill.  For a few days after continuous infusion pump comes off he has abdominal pain and nausea.  States he has had cough and cold like symptoms for past 4 days.  States he is coughing up green phlegm.  Further states he has an earache on left side.

## 2015-12-11 NOTE — Progress Notes (Signed)
[facility] day:  12/11/15  Chief Complaint: Anthony Melendez is a 35 y.o. male with metastatic gastric cancer who is seen for assessment prior to cycle #9 FOLFOX chemotherapy.  HPI:  The patient was last seen on 11/27/2015.  At that time, he received cycle #8 FOLFOX without Neulasta.  Symptomatically, he was doing well.  Platelet count was 106,000.    He notes abdominal pain for 4 days out of the past 2 weeks. He noted some nausea but no vomiting. He has had no diarrhea. He had some brief numbness and tingling in his fingertips after chemotherapy and after exposure to cold. He is taking one iron pill a day.   Past Medical History:  Diagnosis Date  . Gastric cancer ([place]) 08/2015   Folfox chemo tx's.  Marland Kitchen GERD (gastroesophageal reflux disease)     Past Surgical History:  Procedure Laterality Date  . ESOPHAGOGASTRODUODENOSCOPY (EGD) WITH PROPOFOL N/A 08/02/2015   Procedure: ESOPHAGOGASTRODUODENOSCOPY (EGD) WITH PROPOFOL;  Surgeon: Lucilla Lame, MD;  Location: [facility];  Service: Endoscopy;  Laterality: N/A;  . NO PAST SURGERIES    . PERIPHERAL VASCULAR CATHETERIZATION N/A 08/13/2015   Procedure: Glori Luis Cath Insertion;  Surgeon: Algernon Huxley, MD;  Location: [facility];  Service: Cardiovascular;  Laterality: N/A;    No family history on file.  Social History:  reports that he has never smoked. He has never used smokeless tobacco. He reports that he does not drink alcohol or use drugs.  The patient is from [place].  He speaks [place].  He moves to the [place] in 2000.  He lives in [place] with his father, mother, brother and sister.  He is a Theme park manager.  The patient is accompanied by his his sister, Verdene Lennert, and the interpreter today.  Allergies: No Known Allergies  Current Medications: Current Outpatient Prescriptions  Medication Sig Dispense Refill  . ferrous sulfate 325 (65 FE) MG EC tablet Take 1 tablet (325 mg total)  by mouth daily with breakfast. 30 tablet 3  . HYDROcodone-acetaminophen (NORCO) 5-325 MG tablet Take 1 tablet by mouth every 6 (six) hours as needed for moderate pain. 20 tablet 0  . omeprazole (PRILOSEC) 20 MG capsule Take 1 capsule (20 mg total) by mouth 2 (two) times daily before a meal. 60 capsule 2  . prochlorperazine (COMPAZINE) 10 MG tablet Take 1 tablet (10 mg total) by mouth every 6 (six) hours as needed for nausea or vomiting. 30 tablet 1  . potassium chloride (K-DUR) 10 MEQ tablet Take 1 daily or as directed by dr Mike Gip (Patient not taking: Reported on 12/11/2015) 30 tablet 1  . sucralfate (CARAFATE) 1 g tablet Take 1 tablet (1 g total) by mouth 4 (four) times daily -  with meals and at bedtime. (Patient not taking: Reported on 12/11/2015) 60 tablet 0   Current Facility-Administered Medications  Medication Dose Route Frequency Provider Last Rate Last Dose  . dexamethasone (DECADRON) injection 10 mg  10 mg Intravenous Once Lequita Asal, MD      . dexamethasone (DECADRON) injection 10 mg  10 mg Intravenous Once Lequita Asal, MD       Facility-Administered Medications Ordered in Other Visits  Medication Dose Route Frequency Provider Last Rate Last Dose  . heparin lock flush 100 unit/mL  500 Units Intravenous Once Lequita Asal, MD      . heparin lock flush 100 unit/mL  500 Units Intracatheter Once PRN Lequita Asal,  MD      . pegfilgrastim (NEULASTA) injection 6 mg  6 mg Subcutaneous Once Lequita Asal, MD      . sodium chloride 0.9 % injection 10 mL  10 mL Intravenous Once Lequita Asal, MD      . sodium chloride flush (NS) 0.9 % injection 10 mL  10 mL Intravenous PRN Lequita Asal, MD   10 mL at 08/28/15 0835  . sodium chloride flush (NS) 0.9 % injection 10 mL  10 mL Intracatheter PRN Lequita Asal, MD      . sodium chloride flush (NS) 0.9 % injection 10 mL  10 mL Intracatheter PRN Lequita Asal, MD        Review of Systems:  GENERAL:   Feels "pretty good".  No fevers or sweats.  Weight down 2 pounds. PERFORMANCE STATUS (ECOG):  1 HEENT:  URI symptoms, resolving.  No visual changes, runny nose, sore throat, mouth sores or tenderness. Lungs:  No shortness of breath or cough.  No hemoptysis. Cardiac:  No chest pain, palpitations, orthopnea, or PND. GI:   Two days of abdominal pain, now resolved.  Brief nausea.  No vomiting, diarrhea, constipation, melena or hematochezia. GU:  No urgency, frequency, dysuria, or hematuria. Musculoskeletal:  No bone pain.  No muscle tenderness. Extremities:  No pain or swelling. Skin:  No rashes or skin changes. Neuro:  No headache, numbness or weakness, balance or coordination issues. Endocrine:  No diabetes, thyroid issues, hot flashes or night sweats. Psych:  No mood changes, depression or anxiety. Pain:  No pain. Review of systems:  All other systems reviewed and found to be negative.  Physical Exam: Blood pressure 131/87, pulse 66, temperature 98.3 F (36.8 C), temperature source Tympanic, resp. rate 18, weight 184 lb 11.9 oz (83.8 kg). GENERAL:  Well developed, well nourished, gentleman sitting comfortably in the exam room in no acute distress. MENTAL STATUS:  Alert and oriented to person, place and time. HEAD:  Wearing cap.  Black hair.  Thin mustache.  Normocephalic, atraumatic, face symmetric, no Cushingoid features. EYES:  Brown eyes.  No conjunctivitis or scleral icterus. ENT:  Oropharynx clear without lesion.  Tongue normal. Mucous membranes moist.  RESPIRATORY:  Clear to auscultation without rales, wheezes or rhonchi. CARDIOVASCULAR:  Regular rate and rhythm without murmur, rub or gallop. ABDOMEN:  Soft, slightly tender in the epigastric region.  Active bowel sounds and no hepatosplenomegaly.  No masses. SKIN:  No rashes, ulcers or lesions. EXTREMITIES: No edema, no skin discoloration or tenderness.  No palpable cords. LYMPH NODES: No palpable cervical, supraclavicular, axillary  or inguinal adenopathy  NEUROLOGICAL: Unremarkable. PSYCH:  Appropriate.  NEUROLOGICAL: Appropriate.   Infusion on 12/11/2015  Component Date Value Ref Range Status  . WBC 12/11/2015 6.6  3.8 - 10.6 K/uL Final  . RBC 12/11/2015 5.23  4.40 - 5.90 MIL/uL Final  . Hemoglobin 12/11/2015 14.9  13.0 - 18.0 g/dL Final  . HCT 12/11/2015 43.0  40.0 - 52.0 % Final  . MCV 12/11/2015 82.2  80.0 - 100.0 fL Final  . MCH 12/11/2015 28.5  26.0 - 34.0 pg Final  . MCHC 12/11/2015 34.7  32.0 - 36.0 g/dL Final  . RDW 12/11/2015 22.2* 11.5 - 14.5 % Final  . Platelets 12/11/2015 112* 150 - 440 K/uL Final  . Neutrophils Relative % 12/11/2015 63  % Final  . Neutro Abs 12/11/2015 4.1  1.4 - 6.5 K/uL Final  . Lymphocytes Relative 12/11/2015 28  % Final  .  Lymphs Abs 12/11/2015 1.8  1.0 - 3.6 K/uL Final  . Monocytes Relative 12/11/2015 7  % Final  . Monocytes Absolute 12/11/2015 0.5  0.2 - 1.0 K/uL Final  . Eosinophils Relative 12/11/2015 1  % Final  . Eosinophils Absolute 12/11/2015 0.1  0 - 0.7 K/uL Final  . Basophils Relative 12/11/2015 1  % Final  . Basophils Absolute 12/11/2015 0.1  0 - 0.1 K/uL Final  . Sodium 12/11/2015 136  135 - 145 mmol/L Final  . Potassium 12/11/2015 3.8  3.5 - 5.1 mmol/L Final  . Chloride 12/11/2015 105  101 - 111 mmol/L Final  . CO2 12/11/2015 24  22 - 32 mmol/L Final  . Glucose, Bld 12/11/2015 194* 65 - 99 mg/dL Final  . BUN 18/98/4210 9  6 - 20 mg/dL Final  . Creatinine, Ser 12/11/2015 0.67  0.61 - 1.24 mg/dL Final  . Calcium 31/28/1188 8.7* 8.9 - 10.3 mg/dL Final  . Total Protein 12/11/2015 7.9  6.5 - 8.1 g/dL Final  . Albumin 67/73/7366 4.1  3.5 - 5.0 g/dL Final  . AST 81/59/4707 37  15 - 41 U/L Final  . ALT 12/11/2015 22  17 - 63 U/L Final  . Alkaline Phosphatase 12/11/2015 130* 38 - 126 U/L Final  . Total Bilirubin 12/11/2015 0.5  0.3 - 1.2 mg/dL Final  . GFR calc non Af Amer 12/11/2015 >60  >60 mL/min Final  . GFR calc Af Amer 12/11/2015 >60  >60 mL/min Final    Comment: (NOTE) The eGFR has been calculated using the CKD EPI equation. This calculation has not been validated in all clinical situations. eGFR's persistently <60 mL/min signify possible Chronic Kidney Disease.   . Anion gap 12/11/2015 7  5 - 15 Final  . Magnesium 12/11/2015 2.1  1.7 - 2.4 mg/dL Final    Assessment:  Anthony Melendez is a 35 y.o. male with metastatic gastric cancer.  He presented with a 4-6 week history of progressive epigastric pain.  EGD on 08/02/2015 revealed a large, ulcerated, non-circumferential mass with oozing and stigmata of recent bleeding in the entire examined stomach.  The mass involved the fundus down to the antrum.  Biopsies revealed poorly differentiated adenocarcinoma.  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  Her2/neu testing was negative.  Microsatellite testing was negative.  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  Chest, abdomen and pelvic CT scan on 07/29/2015 revealed a 4 cm thickened and irregular gastric wall compatible with an infiltrative malignancy. There was extensive involvement of the upper mesentery and omentum compatible with peritoneal carcinomatosis. There was a small amount of ascites.   Head CT on 08/08/2015 revealed no evidence of metastatic disease.  He completed treatment for H pylori  (began 08/09/2015).  Stool was negative for H pylori on 11/01/2015.  He has iron deficiency anemia likely gastric oozing and modest diet.  RBC are microcytic.  Labs on 08/21/2015 revealed a ferritin was 44 with an iron saturation 4% and a TIBC of 395.  He is s/p 8 cycles of FOLFOX chemotherapy (08/14/2015 - 11/27/2015).  Chemotherapy was held on 10/23/2015 secondary to thrombocytopenia (87,000).  He has tolerated his chemotherapy well without diarrhea, mouth sores, or  neuropathy.  Abdomen and pelvic CT scan on 10/05/2015 revealed a positive response to therapy with decreased size of the primary mass along the lesser curvature of the stomach, decrease extension of  the mass into the gastrohepatic ligament, decreasing evidence of peritoneal carcinomatosis involving predominantly the omentum, and resolution of previously noted malignant ascites. There was a potential ulcer along the greater curvature of the mid body of the stomach.  Symptomatically, he has had some transient abdominal pain.  Exam is normal.  Platelet count is 112,000.  Plan: 1.  Labs today:  CBC with diff, CMP, Mg. 2.  Cycle #9 FOLFOX. 4.  RTC in 2 days for disconnect and Neulasta. 6.  Schedule abdomen/pelvic CT scan on 12/24/2015 7.  RTC on 01/01/2016 for MD assessment, labs (CBC with diff, CMP, Mg), and cycle #10 FOLFOX.   Lequita Asal, MD  12/11/2015, 9:46 AM

## 2015-12-13 ENCOUNTER — Inpatient Hospital Stay: Payer: 59

## 2015-12-13 VITALS — BP 116/78 | HR 67 | Temp 98.4°F | Resp 18

## 2015-12-13 DIAGNOSIS — C168 Malignant neoplasm of overlapping sites of stomach: Secondary | ICD-10-CM

## 2015-12-13 MED ORDER — SODIUM CHLORIDE 0.9% FLUSH
10.0000 mL | INTRAVENOUS | Status: DC | PRN
  Administered 2015-12-13: 10 mL
  Filled 2015-12-13: qty 10

## 2015-12-13 MED ORDER — PEGFILGRASTIM INJECTION 6 MG/0.6ML ~~LOC~~
6.0000 mg | PREFILLED_SYRINGE | Freq: Once | SUBCUTANEOUS | Status: AC
  Administered 2015-12-13: 6 mg via SUBCUTANEOUS
  Filled 2015-12-13: qty 0.6

## 2015-12-13 MED ORDER — HEPARIN SOD (PORK) LOCK FLUSH 100 UNIT/ML IV SOLN
500.0000 [IU] | Freq: Once | INTRAVENOUS | Status: AC | PRN
  Administered 2015-12-13: 500 [IU]
  Filled 2015-12-13: qty 5

## 2015-12-24 ENCOUNTER — Ambulatory Visit: Admission: RE | Admit: 2015-12-24 | Payer: 59 | Source: Ambulatory Visit

## 2015-12-24 ENCOUNTER — Ambulatory Visit
Admission: RE | Admit: 2015-12-24 | Discharge: 2015-12-24 | Disposition: A | Discharge status: Home / Self Care | Payer: 59 | Source: Ambulatory Visit | Attending: Hematology and Oncology | Admitting: Hematology and Oncology

## 2015-12-24 DIAGNOSIS — C169 Malignant neoplasm of stomach, unspecified: Secondary | ICD-10-CM | POA: Insufficient documentation

## 2015-12-24 DIAGNOSIS — C786 Secondary malignant neoplasm of retroperitoneum and peritoneum: Secondary | ICD-10-CM

## 2015-12-24 DIAGNOSIS — C801 Malignant (primary) neoplasm, unspecified: Secondary | ICD-10-CM

## 2015-12-24 DIAGNOSIS — R591 Generalized enlarged lymph nodes: Secondary | ICD-10-CM | POA: Insufficient documentation

## 2015-12-24 MED ORDER — IOPAMIDOL (ISOVUE-300) INJECTION 61%
100.0000 mL | Freq: Once | INTRAVENOUS | Status: AC | PRN
  Administered 2015-12-24: 100 mL via INTRAVENOUS

## 2015-12-26 ENCOUNTER — Other Ambulatory Visit: Payer: Self-pay | Admitting: *Deleted

## 2015-12-26 DIAGNOSIS — C169 Malignant neoplasm of stomach, unspecified: Secondary | ICD-10-CM

## 2016-01-01 ENCOUNTER — Inpatient Hospital Stay: Payer: 59

## 2016-01-01 ENCOUNTER — Encounter: Payer: Self-pay | Admitting: Hematology and Oncology

## 2016-01-01 ENCOUNTER — Encounter: Payer: Self-pay | Admitting: *Deleted

## 2016-01-01 ENCOUNTER — Inpatient Hospital Stay (HOSPITAL_BASED_OUTPATIENT_CLINIC_OR_DEPARTMENT_OTHER): Payer: 59 | Admitting: Hematology and Oncology

## 2016-01-01 VITALS — BP 129/86 | HR 67 | Temp 96.8°F | Resp 18 | Wt 185.2 lb

## 2016-01-01 DIAGNOSIS — C169 Malignant neoplasm of stomach, unspecified: Secondary | ICD-10-CM | POA: Diagnosis not present

## 2016-01-01 DIAGNOSIS — R202 Paresthesia of skin: Secondary | ICD-10-CM

## 2016-01-01 DIAGNOSIS — K5900 Constipation, unspecified: Secondary | ICD-10-CM | POA: Diagnosis not present

## 2016-01-01 DIAGNOSIS — D509 Iron deficiency anemia, unspecified: Secondary | ICD-10-CM

## 2016-01-01 DIAGNOSIS — R718 Other abnormality of red blood cells: Secondary | ICD-10-CM

## 2016-01-01 DIAGNOSIS — R109 Unspecified abdominal pain: Secondary | ICD-10-CM

## 2016-01-01 DIAGNOSIS — B9681 Helicobacter pylori [H. pylori] as the cause of diseases classified elsewhere: Secondary | ICD-10-CM

## 2016-01-01 DIAGNOSIS — K2970 Gastritis, unspecified, without bleeding: Secondary | ICD-10-CM

## 2016-01-01 DIAGNOSIS — K219 Gastro-esophageal reflux disease without esophagitis: Secondary | ICD-10-CM

## 2016-01-01 DIAGNOSIS — Z1509 Genetic susceptibility to other malignant neoplasm: Secondary | ICD-10-CM

## 2016-01-01 DIAGNOSIS — R188 Other ascites: Secondary | ICD-10-CM

## 2016-01-01 DIAGNOSIS — C801 Malignant (primary) neoplasm, unspecified: Secondary | ICD-10-CM

## 2016-01-01 DIAGNOSIS — C786 Secondary malignant neoplasm of retroperitoneum and peritoneum: Secondary | ICD-10-CM | POA: Diagnosis not present

## 2016-01-01 DIAGNOSIS — Z7689 Persons encountering health services in other specified circumstances: Secondary | ICD-10-CM | POA: Diagnosis not present

## 2016-01-01 DIAGNOSIS — Z5111 Encounter for antineoplastic chemotherapy: Secondary | ICD-10-CM

## 2016-01-01 DIAGNOSIS — R200 Anesthesia of skin: Secondary | ICD-10-CM

## 2016-01-01 DIAGNOSIS — D696 Thrombocytopenia, unspecified: Secondary | ICD-10-CM

## 2016-01-01 DIAGNOSIS — C168 Malignant neoplasm of overlapping sites of stomach: Secondary | ICD-10-CM

## 2016-01-01 DIAGNOSIS — E876 Hypokalemia: Secondary | ICD-10-CM

## 2016-01-01 DIAGNOSIS — Z79899 Other long term (current) drug therapy: Secondary | ICD-10-CM

## 2016-01-01 LAB — COMPREHENSIVE METABOLIC PANEL
ALT: 24 U/L (ref 17–63)
AST: 39 U/L (ref 15–41)
Albumin: 4 g/dL (ref 3.5–5.0)
Alkaline Phosphatase: 98 U/L (ref 38–126)
Anion gap: 8 (ref 5–15)
BUN: 12 mg/dL (ref 6–20)
CO2: 24 mmol/L (ref 22–32)
Calcium: 8.8 mg/dL — ABNORMAL LOW (ref 8.9–10.3)
Chloride: 105 mmol/L (ref 101–111)
Creatinine, Ser: 0.77 mg/dL (ref 0.61–1.24)
GFR calc Af Amer: >60 mL/min (ref >60)
GFR calc non Af Amer: >60 mL/min (ref >60)
Glucose, Bld: 228 mg/dL — ABNORMAL HIGH (ref 65–99)
Potassium: 3.5 mmol/L (ref 3.5–5.1)
Sodium: 137 mmol/L (ref 135–145)
Total Bilirubin: 0.5 mg/dL (ref 0.3–1.2)
Total Protein: 7.5 g/dL (ref 6.5–8.1)

## 2016-01-01 LAB — CBC WITH DIFFERENTIAL/PLATELET
Basophils Absolute: 0.1 10*3/uL (ref 0–0.1)
Basophils Relative: 1 %
Eosinophils Absolute: 0.1 10*3/uL (ref 0–0.7)
Eosinophils Relative: 1 %
HCT: 41.6 % (ref 40.0–52.0)
Hemoglobin: 14.3 g/dL (ref 13.0–18.0)
Lymphocytes Relative: 28 %
Lymphs Abs: 1.5 10*3/uL (ref 1.0–3.6)
MCH: 29.2 pg (ref 26.0–34.0)
MCHC: 34.5 g/dL (ref 32.0–36.0)
MCV: 84.6 fL (ref 80.0–100.0)
Monocytes Absolute: 0.4 10*3/uL (ref 0.2–1.0)
Monocytes Relative: 8 %
Neutro Abs: 3.3 10*3/uL (ref 1.4–6.5)
Neutrophils Relative %: 62 %
Platelets: 137 10*3/uL — ABNORMAL LOW (ref 150–440)
RBC: 4.92 MIL/uL (ref 4.40–5.90)
RDW: 20.1 % — ABNORMAL HIGH (ref 11.5–14.5)
WBC: 5.3 10*3/uL (ref 3.8–10.6)

## 2016-01-01 LAB — MAGNESIUM: Magnesium: 1.9 mg/dL (ref 1.7–2.4)

## 2016-01-01 MED ORDER — SODIUM CHLORIDE 0.9 % IV SOLN
2400.0000 mg/m2 | INTRAVENOUS | Status: DC
  Administered 2016-01-01: 4700 mg via INTRAVENOUS
  Filled 2016-01-01: qty 94

## 2016-01-01 MED ORDER — PALONOSETRON HCL INJECTION 0.25 MG/5ML
0.2500 mg | Freq: Once | INTRAVENOUS | Status: AC
  Administered 2016-01-01: 0.25 mg via INTRAVENOUS
  Filled 2016-01-01: qty 5

## 2016-01-01 MED ORDER — DEXTROSE 5 % IV SOLN
Freq: Once | INTRAVENOUS | Status: AC
  Administered 2016-01-01: 11:00:00 via INTRAVENOUS
  Filled 2016-01-01: qty 1000

## 2016-01-01 MED ORDER — HEPARIN SOD (PORK) LOCK FLUSH 100 UNIT/ML IV SOLN: 500.0000 [IU] | Freq: Once | INTRAVENOUS | Status: DC

## 2016-01-01 MED ORDER — SODIUM CHLORIDE 0.9% FLUSH
10.0000 mL | INTRAVENOUS | Status: DC | PRN
  Administered 2016-01-01: 10 mL via INTRAVENOUS
  Filled 2016-01-01: qty 10

## 2016-01-01 MED ORDER — OMEPRAZOLE 20 MG PO CPDR: 20.0000 mg | DELAYED_RELEASE_CAPSULE | Freq: Two times a day (BID) | ORAL | 2 refills | Status: DC

## 2016-01-01 MED ORDER — FLUOROURACIL CHEMO INJECTION 2.5 GM/50ML
400.0000 mg/m2 | Freq: Once | INTRAVENOUS | Status: AC
  Administered 2016-01-01: 800 mg via INTRAVENOUS
  Filled 2016-01-01: qty 16

## 2016-01-01 MED ORDER — HEPARIN SOD (PORK) LOCK FLUSH 100 UNIT/ML IV SOLN: 500.0000 [IU] | Freq: Once | INTRAVENOUS | Status: DC | PRN

## 2016-01-01 MED ORDER — LEUCOVORIN CALCIUM INJECTION 350 MG
800.0000 mg | Freq: Once | INTRAMUSCULAR | Status: AC
  Administered 2016-01-01: 800 mg via INTRAVENOUS
  Filled 2016-01-01: qty 40

## 2016-01-01 MED ORDER — SODIUM CHLORIDE 0.9 % IV SOLN: 10.0000 mg | Freq: Once | INTRAVENOUS | Status: DC

## 2016-01-01 MED ORDER — OXALIPLATIN CHEMO INJECTION 100 MG/20ML
85.0000 mg/m2 | Freq: Once | INTRAVENOUS | Status: AC
  Administered 2016-01-01: 165 mg via INTRAVENOUS
  Filled 2016-01-01: qty 33

## 2016-01-01 MED ORDER — DEXAMETHASONE SODIUM PHOSPHATE 10 MG/ML IJ SOLN
10.0000 mg | Freq: Once | INTRAMUSCULAR | Status: AC
  Administered 2016-01-01: 10 mg via INTRAVENOUS
  Filled 2016-01-01: qty 1

## 2016-01-01 NOTE — Progress Notes (Signed)
About one week ago patient c/o having chills for about 15 minutes. Evelina Bucy his temp and he had no fever.  Chills resolved and has not other episodes.

## 2016-01-01 NOTE — Progress Notes (Signed)
[facility] day:  01/01/16  Chief Complaint: Anthony Melendez is a 35 y.o. male with metastatic gastric cancer who is seen for assessment prior to cycle #10 FOLFOX chemotherapy.  HPI:  The patient was last seen on 12/11/2015.  At that time, he received cycle #9 FOLFOX without Neulasta.  Platelet count was 112,000.  Symptomatically, he was doing well.  He did note a 4 day history of abdominal pain.  Restaging scans were scheduled.  Abdomen and pelvic CT scan on 12/24/2015 revealed further improvement in primary gastric malignancy and adjacent lymphadenopathy. There was no residual peritoneal carcinomatosis identified.   Symptomatically, he has done well.  He only notes a cold neuropathy. About a week ago, he had some chills and a sensation like he had a fever.  Symptoms were briefly (lasted 15 minutes).  He had no documented fever. He has had no sick contacts. He denies any other symptoms. Yesterday he had a brief episode of abdominal pain.  He did not take a pain pill secondary to constipation.   Past Medical History:  Diagnosis Date  . Gastric cancer ([place]) 08/2015   Folfox chemo tx's.  Marland Kitchen GERD (gastroesophageal reflux disease)     Past Surgical History:  Procedure Laterality Date  . ESOPHAGOGASTRODUODENOSCOPY (EGD) WITH PROPOFOL N/A 08/02/2015   Procedure: ESOPHAGOGASTRODUODENOSCOPY (EGD) WITH PROPOFOL;  Surgeon: Lucilla Lame, MD;  Location: [facility];  Service: Endoscopy;  Laterality: N/A;  . NO PAST SURGERIES    . PERIPHERAL VASCULAR CATHETERIZATION N/A 08/13/2015   Procedure: Glori Luis Cath Insertion;  Surgeon: Algernon Huxley, MD;  Location: [facility];  Service: Cardiovascular;  Laterality: N/A;    History reviewed. No pertinent family history.  Social History:  reports that he has never smoked. He has never used smokeless tobacco. He reports that he does not drink alcohol or use drugs.  The patient is from [place].  He speaks  [place].  He moves to the [place] in 2000.  He lives in [place] with his mother, father, brother and sister.  He is a Theme park manager.  The patient is accompanied by his his mother, brother, and the interpreter today.  Allergies: No Known Allergies  Current Medications: Current Outpatient Prescriptions  Medication Sig Dispense Refill  . ferrous sulfate 325 (65 FE) MG EC tablet Take 1 tablet (325 mg total) by mouth daily with breakfast. 30 tablet 3  . HYDROcodone-acetaminophen (NORCO) 5-325 MG tablet Take 1 tablet by mouth every 6 (six) hours as needed for moderate pain. 20 tablet 0  . omeprazole (PRILOSEC) 20 MG capsule Take 1 capsule (20 mg total) by mouth 2 (two) times daily before a meal. 60 capsule 2  . potassium chloride (K-DUR) 10 MEQ tablet Take 1 daily or as directed by dr Mike Gip 30 tablet 1  . prochlorperazine (COMPAZINE) 10 MG tablet Take 1 tablet (10 mg total) by mouth every 6 (six) hours as needed for nausea or vomiting. 30 tablet 1  . sucralfate (CARAFATE) 1 g tablet Take 1 tablet (1 g total) by mouth 4 (four) times daily -  with meals and at bedtime. (Patient not taking: Reported on 01/01/2016) 60 tablet 0   Current Facility-Administered Medications  Medication Dose Route Frequency Provider Last Rate Last Dose  . dexamethasone (DECADRON) injection 10 mg  10 mg Intravenous Once Lequita Asal, MD      . dexamethasone (DECADRON) injection 10 mg  10 mg Intravenous Once Lequita Asal, MD  Facility-Administered Medications Ordered in Other Visits  Medication Dose Route Frequency Provider Last Rate Last Dose  . heparin lock flush 100 unit/mL  500 Units Intravenous Once Lequita Asal, MD      . heparin lock flush 100 unit/mL  500 Units Intracatheter Once PRN Lequita Asal, MD      . heparin lock flush 100 unit/mL  500 Units Intravenous Once Lequita Asal, MD      . pegfilgrastim (NEULASTA) injection 6 mg  6 mg Subcutaneous Once Lequita Asal, MD       . sodium chloride 0.9 % injection 10 mL  10 mL Intravenous Once Lequita Asal, MD      . sodium chloride flush (NS) 0.9 % injection 10 mL  10 mL Intravenous PRN Lequita Asal, MD   10 mL at 08/28/15 0835  . sodium chloride flush (NS) 0.9 % injection 10 mL  10 mL Intracatheter PRN Lequita Asal, MD      . sodium chloride flush (NS) 0.9 % injection 10 mL  10 mL Intravenous PRN Lequita Asal, MD   10 mL at 01/01/16 0912    Review of Systems:  GENERAL:  Feels good.  No fevers or sweats.  Weight up 1 pound. PERFORMANCE STATUS (ECOG):  1 HEENT:  No visual changes, runny nose, sore throat, mouth sores or tenderness. Lungs:  No shortness of breath or cough.  No hemoptysis. Cardiac:  No chest pain, palpitations, orthopnea, or PND. GI:   Brief abdominal pain yesterday.  Intermittent constipation.  No vomiting, diarrhea, melena or hematochezia. GU:  No urgency, frequency, dysuria, or hematuria. Musculoskeletal:  No bone pain.  No muscle tenderness.  Takes Claritin to prevent Neulasta induced bone pain. Extremities:  No pain or swelling. Skin:  No rashes or skin changes. Neuro:  No headache, numbness or weakness, balance or coordination issues. Endocrine:  No diabetes, thyroid issues, hot flashes or night sweats. Psych:  No mood changes, depression or anxiety. Pain:  No pain. Review of systems:  All other systems reviewed and found to be negative.  Physical Exam: Blood pressure 129/86, pulse 67, temperature (!) 96.8 F (36 C), temperature source Tympanic, resp. rate 18, weight 185 lb 3 oz (84 kg). GENERAL:  Well developed, well nourished, gentleman sitting comfortably in the exam room in no acute distress. MENTAL STATUS:  Alert and oriented to person, place and time. HEAD:  Wearing a blue cap.  Black hair.  Thin mustache.  Normocephalic, atraumatic, face symmetric, no Cushingoid features. EYES:  Brown eyes.  No conjunctivitis or scleral icterus. ENT:  Oropharynx clear without  lesion.  Tongue normal. Mucous membranes moist.  RESPIRATORY:  Clear to auscultation without rales, wheezes or rhonchi. CARDIOVASCULAR:  Regular rate and rhythm without murmur, rub or gallop. ABDOMEN:  Soft, non-tender with active bowel sounds and no hepatosplenomegaly.  No masses. SKIN:  No rashes, ulcers or lesions. EXTREMITIES: No edema, no skin discoloration or tenderness.  No palpable cords. LYMPH NODES: No palpable cervical, supraclavicular, axillary or inguinal adenopathy  NEUROLOGICAL: Unremarkable. PSYCH:  Appropriate.  NEUROLOGICAL: Appropriate.   Infusion on 01/01/2016  Component Date Value Ref Range Status  . Sodium 01/01/2016 137  135 - 145 mmol/L Final  . Potassium 01/01/2016 3.5  3.5 - 5.1 mmol/L Final  . Chloride 01/01/2016 105  101 - 111 mmol/L Final  . CO2 01/01/2016 24  22 - 32 mmol/L Final  . Glucose, Bld 01/01/2016 228* 65 - 99 mg/dL Final  .  BUN 01/01/2016 12  6 - 20 mg/dL Final  . Creatinine, Ser 01/01/2016 0.77  0.61 - 1.24 mg/dL Final  . Calcium 01/01/2016 8.8* 8.9 - 10.3 mg/dL Final  . Total Protein 01/01/2016 7.5  6.5 - 8.1 g/dL Final  . Albumin 01/01/2016 4.0  3.5 - 5.0 g/dL Final  . AST 01/01/2016 39  15 - 41 U/L Final  . ALT 01/01/2016 24  17 - 63 U/L Final  . Alkaline Phosphatase 01/01/2016 98  38 - 126 U/L Final  . Total Bilirubin 01/01/2016 0.5  0.3 - 1.2 mg/dL Final  . GFR calc non Af Amer 01/01/2016 >60  >60 mL/min Final  . GFR calc Af Amer 01/01/2016 >60  >60 mL/min Final   Comment: (NOTE) The eGFR has been calculated using the CKD EPI equation. This calculation has not been validated in all clinical situations. eGFR's persistently <60 mL/min signify possible Chronic Kidney Disease.   . Anion gap 01/01/2016 8  5 - 15 Final  . WBC 01/01/2016 5.3  3.8 - 10.6 K/uL Final  . RBC 01/01/2016 4.92  4.40 - 5.90 MIL/uL Final  . Hemoglobin 01/01/2016 14.3  13.0 - 18.0 g/dL Final  . HCT 01/01/2016 41.6  40.0 - 52.0 % Final  . MCV 01/01/2016 84.6  80.0  - 100.0 fL Final  . MCH 01/01/2016 29.2  26.0 - 34.0 pg Final  . MCHC 01/01/2016 34.5  32.0 - 36.0 g/dL Final  . RDW 01/01/2016 20.1* 11.5 - 14.5 % Final  . Platelets 01/01/2016 137* 150 - 440 K/uL Final  . Neutrophils Relative % 01/01/2016 62  % Final  . Neutro Abs 01/01/2016 3.3  1.4 - 6.5 K/uL Final  . Lymphocytes Relative 01/01/2016 28  % Final  . Lymphs Abs 01/01/2016 1.5  1.0 - 3.6 K/uL Final  . Monocytes Relative 01/01/2016 8  % Final  . Monocytes Absolute 01/01/2016 0.4  0.2 - 1.0 K/uL Final  . Eosinophils Relative 01/01/2016 1  % Final  . Eosinophils Absolute 01/01/2016 0.1  0 - 0.7 K/uL Final  . Basophils Relative 01/01/2016 1  % Final  . Basophils Absolute 01/01/2016 0.1  0 - 0.1 K/uL Final  . Magnesium 01/01/2016 1.9  1.7 - 2.4 mg/dL Final    Assessment:  Rutilio Yellowhair is a 35 y.o. male with metastatic gastric cancer.  He presented with a 4-6 week history of progressive epigastric pain.  EGD on 08/02/2015 revealed a large, ulcerated, non-circumferential mass with oozing and stigmata of recent bleeding in the entire examined stomach.  The mass involved the fundus down to the antrum.  Biopsies revealed poorly differentiated adenocarcinoma.  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  Her2/neu testing was negative.  Microsatellite testing was negative.  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  Chest, abdomen and pelvic CT scan on 07/29/2015 revealed a 4 cm thickened and irregular gastric wall compatible with an infiltrative malignancy. There was extensive involvement of the upper mesentery and omentum compatible with peritoneal carcinomatosis. There was a small amount of ascites.   Head CT on 08/08/2015 revealed no evidence of metastatic disease.  He completed treatment for H pylori  (began 08/09/2015).   Stool was negative for H pylori on 11/01/2015.  He has iron deficiency anemia likely gastric oozing and modest diet.  RBC are microcytic.  Labs on 08/21/2015 revealed a ferritin was 44 with an  iron saturation 4% and a TIBC of 395.  He is s/p 9 cycles of FOLFOX chemotherapy (08/14/2015 - 12/11/2015).  Chemotherapy was held on 10/23/2015 secondary to thrombocytopenia (87,000).  He has tolerated his chemotherapy well without diarrhea, mouth sores, or neuropathy.  Abdomen and pelvic CT scan on 10/05/2015 revealed a positive response to therapy with decreased size of the primary mass along the lesser curvature of the stomach, decrease extension of the mass into the gastrohepatic ligament, decreasing evidence of peritoneal carcinomatosis involving predominantly the omentum, and resolution of previously noted malignant ascites. There was a potential ulcer along the greater curvature of the mid body of the stomach.  Abdomen and pelvic CT scan on 12/24/2015 revealed further improvement in primary gastric malignancy and adjacent lymphadenopathy. There was no residual peritoneal carcinomatosis identified.  Symptomatically, he is doing well.  Exam is normal.  Platelet count is 137,000.  Plan: 1.  Labs today:  CBC with diff, CMP, Mg. 2.  Review CT scans.  Discuss continued excellent response with minimal side effects of treatment.  Discuss plan to complete 12 cycles of FOLFOX. 3.  Refill omeprazole. 4.  Cycle #10 FOLFOX. 5.  RTC in 2 days for disconnect and Neulasta. 6.  RTC in 2 weeks for MD assessment, labs (CBC with diff, CMP, Mg), and cycle #11 FOLFOX.   Lequita Asal, MD  01/01/2016, 9:57 AM

## 2016-01-03 ENCOUNTER — Inpatient Hospital Stay: Payer: 59

## 2016-01-03 VITALS — BP 119/71 | HR 65 | Temp 97.0°F | Resp 18

## 2016-01-03 DIAGNOSIS — C168 Malignant neoplasm of overlapping sites of stomach: Secondary | ICD-10-CM

## 2016-01-03 MED ORDER — PEGFILGRASTIM INJECTION 6 MG/0.6ML ~~LOC~~
6.0000 mg | PREFILLED_SYRINGE | Freq: Once | SUBCUTANEOUS | Status: AC
  Administered 2016-01-03: 6 mg via SUBCUTANEOUS
  Filled 2016-01-03: qty 0.6

## 2016-01-03 MED ORDER — SODIUM CHLORIDE 0.9% FLUSH
10.0000 mL | INTRAVENOUS | Status: DC | PRN
  Administered 2016-01-03: 10 mL
  Filled 2016-01-03: qty 10

## 2016-01-03 MED ORDER — HEPARIN SOD (PORK) LOCK FLUSH 100 UNIT/ML IV SOLN
500.0000 [IU] | Freq: Once | INTRAVENOUS | Status: AC | PRN
  Administered 2016-01-03: 500 [IU]
  Filled 2016-01-03: qty 5

## 2016-01-15 ENCOUNTER — Encounter: Payer: Self-pay | Admitting: *Deleted

## 2016-01-15 ENCOUNTER — Encounter: Payer: Self-pay | Admitting: Hematology and Oncology

## 2016-01-15 ENCOUNTER — Inpatient Hospital Stay: Payer: 59

## 2016-01-15 ENCOUNTER — Other Ambulatory Visit: Payer: Self-pay

## 2016-01-15 ENCOUNTER — Inpatient Hospital Stay (HOSPITAL_BASED_OUTPATIENT_CLINIC_OR_DEPARTMENT_OTHER): Payer: 59 | Admitting: Oncology

## 2016-01-15 ENCOUNTER — Inpatient Hospital Stay: Payer: 59 | Attending: Hematology and Oncology

## 2016-01-15 DIAGNOSIS — D696 Thrombocytopenia, unspecified: Secondary | ICD-10-CM

## 2016-01-15 DIAGNOSIS — Z1509 Genetic susceptibility to other malignant neoplasm: Secondary | ICD-10-CM

## 2016-01-15 DIAGNOSIS — Z7689 Persons encountering health services in other specified circumstances: Secondary | ICD-10-CM | POA: Diagnosis not present

## 2016-01-15 DIAGNOSIS — Z5111 Encounter for antineoplastic chemotherapy: Secondary | ICD-10-CM | POA: Insufficient documentation

## 2016-01-15 DIAGNOSIS — K219 Gastro-esophageal reflux disease without esophagitis: Secondary | ICD-10-CM | POA: Insufficient documentation

## 2016-01-15 DIAGNOSIS — D509 Iron deficiency anemia, unspecified: Secondary | ICD-10-CM | POA: Insufficient documentation

## 2016-01-15 DIAGNOSIS — C168 Malignant neoplasm of overlapping sites of stomach: Secondary | ICD-10-CM

## 2016-01-15 DIAGNOSIS — C786 Secondary malignant neoplasm of retroperitoneum and peritoneum: Secondary | ICD-10-CM

## 2016-01-15 DIAGNOSIS — K5900 Constipation, unspecified: Secondary | ICD-10-CM

## 2016-01-15 DIAGNOSIS — Z79899 Other long term (current) drug therapy: Secondary | ICD-10-CM | POA: Insufficient documentation

## 2016-01-15 DIAGNOSIS — C169 Malignant neoplasm of stomach, unspecified: Secondary | ICD-10-CM

## 2016-01-15 LAB — CBC WITH DIFFERENTIAL/PLATELET
Basophils Absolute: 0 10*3/uL (ref 0–0.1)
Basophils Relative: 1 %
Eosinophils Absolute: 0.1 10*3/uL (ref 0–0.7)
Eosinophils Relative: 1 %
HCT: 42 % (ref 40.0–52.0)
Hemoglobin: 14.6 g/dL (ref 13.0–18.0)
Lymphocytes Relative: 24 %
Lymphs Abs: 1.7 10*3/uL (ref 1.0–3.6)
MCH: 30.1 pg (ref 26.0–34.0)
MCHC: 34.8 g/dL (ref 32.0–36.0)
MCV: 86.6 fL (ref 80.0–100.0)
Monocytes Absolute: 0.5 10*3/uL (ref 0.2–1.0)
Monocytes Relative: 7 %
Neutro Abs: 4.6 10*3/uL (ref 1.4–6.5)
Neutrophils Relative %: 67 %
Platelets: 90 10*3/uL — ABNORMAL LOW (ref 150–440)
RBC: 4.86 MIL/uL (ref 4.40–5.90)
RDW: 18.5 % — ABNORMAL HIGH (ref 11.5–14.5)
WBC: 6.9 10*3/uL (ref 3.8–10.6)

## 2016-01-15 LAB — COMPREHENSIVE METABOLIC PANEL
ALT: 25 U/L (ref 17–63)
AST: 35 U/L (ref 15–41)
Albumin: 4.1 g/dL (ref 3.5–5.0)
Alkaline Phosphatase: 149 U/L — ABNORMAL HIGH (ref 38–126)
Anion gap: 6 (ref 5–15)
BUN: 10 mg/dL (ref 6–20)
CO2: 24 mmol/L (ref 22–32)
Calcium: 8.8 mg/dL — ABNORMAL LOW (ref 8.9–10.3)
Chloride: 106 mmol/L (ref 101–111)
Creatinine, Ser: 0.67 mg/dL (ref 0.61–1.24)
GFR calc Af Amer: >60 mL/min (ref >60)
GFR calc non Af Amer: >60 mL/min (ref >60)
Glucose, Bld: 172 mg/dL — ABNORMAL HIGH (ref 65–99)
Potassium: 3.5 mmol/L (ref 3.5–5.1)
Sodium: 136 mmol/L (ref 135–145)
Total Bilirubin: 0.5 mg/dL (ref 0.3–1.2)
Total Protein: 7.5 g/dL (ref 6.5–8.1)

## 2016-01-15 LAB — MAGNESIUM: Magnesium: 2 mg/dL (ref 1.7–2.4)

## 2016-01-15 MED ORDER — SODIUM CHLORIDE 0.9 % IV SOLN
2400.0000 mg/m2 | INTRAVENOUS | Status: DC
  Administered 2016-01-15: 4700 mg via INTRAVENOUS
  Filled 2016-01-15: qty 94

## 2016-01-15 MED ORDER — FLUOROURACIL CHEMO INJECTION 2.5 GM/50ML
400.0000 mg/m2 | Freq: Once | INTRAVENOUS | Status: AC
  Administered 2016-01-15: 800 mg via INTRAVENOUS
  Filled 2016-01-15: qty 16

## 2016-01-15 MED ORDER — OXALIPLATIN CHEMO INJECTION 100 MG/20ML
85.0000 mg/m2 | Freq: Once | INTRAVENOUS | Status: AC
  Administered 2016-01-15: 165 mg via INTRAVENOUS
  Filled 2016-01-15: qty 33

## 2016-01-15 MED ORDER — PALONOSETRON HCL INJECTION 0.25 MG/5ML
0.2500 mg | Freq: Once | INTRAVENOUS | Status: AC
  Administered 2016-01-15: 0.25 mg via INTRAVENOUS
  Filled 2016-01-15: qty 5

## 2016-01-15 MED ORDER — SODIUM CHLORIDE 0.9 % IV SOLN: 10.0000 mg | Freq: Once | INTRAVENOUS | Status: DC

## 2016-01-15 MED ORDER — LEUCOVORIN CALCIUM INJECTION 350 MG
800.0000 mg | Freq: Once | INTRAMUSCULAR | Status: AC
  Administered 2016-01-15: 800 mg via INTRAVENOUS
  Filled 2016-01-15: qty 35

## 2016-01-15 MED ORDER — DEXAMETHASONE SODIUM PHOSPHATE 10 MG/ML IJ SOLN
10.0000 mg | Freq: Once | INTRAMUSCULAR | Status: AC
  Administered 2016-01-15: 10 mg via INTRAVENOUS
  Filled 2016-01-15: qty 1

## 2016-01-15 MED ORDER — DEXTROSE 5 % IV SOLN
Freq: Once | INTRAVENOUS | Status: AC
  Administered 2016-01-15: 10:00:00 via INTRAVENOUS
  Filled 2016-01-15: qty 1000

## 2016-01-15 NOTE — Progress Notes (Signed)
Plts 90. MD ok to proceed with treatment.

## 2016-01-15 NOTE — Progress Notes (Signed)
Offers no complaints. States is feeling well. 

## 2016-01-17 ENCOUNTER — Other Ambulatory Visit: Payer: Self-pay | Admitting: *Deleted

## 2016-01-17 ENCOUNTER — Inpatient Hospital Stay: Payer: 59

## 2016-01-17 ENCOUNTER — Other Ambulatory Visit: Payer: Self-pay | Admitting: Hematology and Oncology

## 2016-01-17 VITALS — BP 114/73 | HR 71 | Temp 98.5°F | Resp 18

## 2016-01-17 DIAGNOSIS — C168 Malignant neoplasm of overlapping sites of stomach: Secondary | ICD-10-CM

## 2016-01-17 MED ORDER — HEPARIN SOD (PORK) LOCK FLUSH 100 UNIT/ML IV SOLN
500.0000 [IU] | Freq: Once | INTRAVENOUS | Status: AC
  Administered 2016-01-17: 500 [IU] via INTRAVENOUS

## 2016-01-17 MED ORDER — PEGFILGRASTIM INJECTION 6 MG/0.6ML ~~LOC~~
6.0000 mg | PREFILLED_SYRINGE | Freq: Once | SUBCUTANEOUS | Status: AC
  Administered 2016-01-17: 6 mg via SUBCUTANEOUS
  Filled 2016-01-17: qty 0.6

## 2016-01-17 MED ORDER — HEPARIN SOD (PORK) LOCK FLUSH 100 UNIT/ML IV SOLN
INTRAVENOUS | Status: AC
Start: 2016-01-17 — End: 2016-01-17
  Filled 2016-01-17: qty 5

## 2016-01-17 NOTE — Progress Notes (Signed)
[facility] day:  01/17/16  Chief Complaint: Anthony Melendez is a 35 y.o. male with metastatic gastric cancer who is seen for assessment prior to cycle #11 FOLFOX chemotherapy.  HPI:  Symptomatically, he was doing well. 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 He continues to have mild constipation. He denies any recent fevers. Patient feels at his baseline and offers no further specific complaints.  Abdomen and pelvic CT scan on 12/24/2015 revealed further improvement in primary gastric malignancy and adjacent lymphadenopathy. There was no residual peritoneal carcinomatosis identified.    Past Medical History:  Diagnosis Date  . Gastric cancer ([place]) 08/2015   Folfox chemo tx's.  Marland Kitchen GERD (gastroesophageal reflux disease)     Past Surgical History:  Procedure Laterality Date  . ESOPHAGOGASTRODUODENOSCOPY (EGD) WITH PROPOFOL N/A 08/02/2015   Procedure: ESOPHAGOGASTRODUODENOSCOPY (EGD) WITH PROPOFOL;  Surgeon: Lucilla Lame, MD;  Location: [facility];  Service: Endoscopy;  Laterality: N/A;  . NO PAST SURGERIES    . PERIPHERAL VASCULAR CATHETERIZATION N/A 08/13/2015   Procedure: Glori Luis Cath Insertion;  Surgeon: Algernon Huxley, MD;  Location: [facility];  Service: Cardiovascular;  Laterality: N/A;    No family history on file.  Social History:  reports that he has never smoked. He has never used smokeless tobacco. He reports that he does not drink alcohol or use drugs.  The patient is from [place].  He speaks [place].  He moves to the [place] in 2000.  He lives in [place] with his mother, father, brother and sister.  He is a Theme park manager.  The patient is accompanied by his his mother, brother, and the interpreter today.  Allergies: No Known Allergies  Current Medications: Current Outpatient Prescriptions  Medication Sig Dispense Refill  . ferrous sulfate 325 (65 FE) MG EC tablet Take 1 tablet (325 mg total) by mouth daily  with breakfast. 30 tablet 3  . HYDROcodone-acetaminophen (NORCO) 5-325 MG tablet Take 1 tablet by mouth every 6 (six) hours as needed for moderate pain. 20 tablet 0  . omeprazole (PRILOSEC) 20 MG capsule Take 1 capsule (20 mg total) by mouth 2 (two) times daily before a meal. 60 capsule 2  . potassium chloride (K-DUR) 10 MEQ tablet Take 1 daily or as directed by dr Mike Gip 30 tablet 1  . prochlorperazine (COMPAZINE) 10 MG tablet Take 1 tablet (10 mg total) by mouth every 6 (six) hours as needed for nausea or vomiting. 30 tablet 1   No current facility-administered medications for this visit.    Facility-Administered Medications Ordered in Other Visits  Medication Dose Route Frequency Provider Last Rate Last Dose  . heparin lock flush 100 unit/mL  500 Units Intravenous Once Lequita Asal, MD      . heparin lock flush 100 unit/mL  500 Units Intracatheter Once PRN Lequita Asal, MD      . pegfilgrastim (NEULASTA) injection 6 mg  6 mg Subcutaneous Once Lequita Asal, MD      . sodium chloride 0.9 % injection 10 mL  10 mL Intravenous Once Lequita Asal, MD      . sodium chloride flush (NS) 0.9 % injection 10 mL  10 mL Intravenous PRN Lequita Asal, MD   10 mL at 08/28/15 0835  . sodium chloride flush (NS) 0.9 % injection 10 mL  10 mL Intracatheter PRN Lequita Asal, MD        Review of Systems:  GENERAL:  Feels good.  No fevers or sweats.  Weight up 1 pound. PERFORMANCE STATUS (ECOG):  0 HEENT:  No visual changes, runny nose, sore throat, mouth sores or tenderness. Lungs:  No shortness of breath or cough.  No hemoptysis. Cardiac:  No chest pain, palpitations, orthopnea, or PND. GI:   Brief abdominal pain yesterday.  Intermittent constipation.  No vomiting, diarrhea, melena or hematochezia. GU:  No urgency, frequency, dysuria, or hematuria. Musculoskeletal:  No bone pain.  No muscle tenderness.  Takes Claritin to prevent Neulasta induced bone pain. Extremities:  No  pain or swelling. Skin:  No rashes or skin changes. Neuro:  No headache, numbness or weakness, balance or coordination issues. Endocrine:  No diabetes, thyroid issues, hot flashes or night sweats. Psych:  No mood changes, depression or anxiety. Pain:  No pain.  Review of systems:  All other systems reviewed and found to be negative.  Physical Exam: Blood pressure 117/82, pulse 61, temperature 97.9 F (36.6 C), temperature source Tympanic, resp. rate 18, weight 184 lb 3.1 oz (83.6 kg). GENERAL:  Well developed, well nourished, gentleman sitting comfortably in the exam room in no acute distress. MENTAL STATUS:  Alert and oriented to person, place and time. HEAD:  Wearing a blue cap.  Black hair.  Thin mustache.  Normocephalic, atraumatic, face symmetric, no Cushingoid features. EYES:  Brown eyes.  No conjunctivitis or scleral icterus. ENT:  Oropharynx clear without lesion.  Tongue normal. Mucous membranes moist.  RESPIRATORY:  Clear to auscultation without rales, wheezes or rhonchi. CARDIOVASCULAR:  Regular rate and rhythm without murmur, rub or gallop. ABDOMEN:  Soft, non-tender with active bowel sounds and no hepatosplenomegaly.  No masses. SKIN:  No rashes, ulcers or lesions. EXTREMITIES: No edema, no skin discoloration or tenderness.  No palpable cords. LYMPH NODES: No palpable cervical, supraclavicular, axillary or inguinal adenopathy  NEUROLOGICAL: Unremarkable. PSYCH:  Appropriate.  NEUROLOGICAL: Appropriate.   Orders Only on 01/15/2016  Component Date Value Ref Range Status  . WBC 01/15/2016 6.9  3.8 - 10.6 K/uL Final  . RBC 01/15/2016 4.86  4.40 - 5.90 MIL/uL Final  . Hemoglobin 01/15/2016 14.6  13.0 - 18.0 g/dL Final  . HCT 01/15/2016 42.0  40.0 - 52.0 % Final  . MCV 01/15/2016 86.6  80.0 - 100.0 fL Final  . MCH 01/15/2016 30.1  26.0 - 34.0 pg Final  . MCHC 01/15/2016 34.8  32.0 - 36.0 g/dL Final  . RDW 01/15/2016 18.5* 11.5 - 14.5 % Final  . Platelets 01/15/2016 90* 150  - 440 K/uL Final  . Neutrophils Relative % 01/15/2016 67  % Final  . Neutro Abs 01/15/2016 4.6  1.4 - 6.5 K/uL Final  . Lymphocytes Relative 01/15/2016 24  % Final  . Lymphs Abs 01/15/2016 1.7  1.0 - 3.6 K/uL Final  . Monocytes Relative 01/15/2016 7  % Final  . Monocytes Absolute 01/15/2016 0.5  0.2 - 1.0 K/uL Final  . Eosinophils Relative 01/15/2016 1  % Final  . Eosinophils Absolute 01/15/2016 0.1  0 - 0.7 K/uL Final  . Basophils Relative 01/15/2016 1  % Final  . Basophils Absolute 01/15/2016 0.0  0 - 0.1 K/uL Final  . Sodium 01/15/2016 136  135 - 145 mmol/L Final  . Potassium 01/15/2016 3.5  3.5 - 5.1 mmol/L Final  . Chloride 01/15/2016 106  101 - 111 mmol/L Final  . CO2 01/15/2016 24  22 - 32 mmol/L Final  . Glucose, Bld 01/15/2016 172* 65 - 99 mg/dL Final  . BUN  01/15/2016 10  6 - 20 mg/dL Final  . Creatinine, Ser 01/15/2016 0.67  0.61 - 1.24 mg/dL Final  . Calcium 01/15/2016 8.8* 8.9 - 10.3 mg/dL Final  . Total Protein 01/15/2016 7.5  6.5 - 8.1 g/dL Final  . Albumin 01/15/2016 4.1  3.5 - 5.0 g/dL Final  . AST 01/15/2016 35  15 - 41 U/L Final  . ALT 01/15/2016 25  17 - 63 U/L Final  . Alkaline Phosphatase 01/15/2016 149* 38 - 126 U/L Final  . Total Bilirubin 01/15/2016 0.5  0.3 - 1.2 mg/dL Final  . GFR calc non Af Amer 01/15/2016 >60  >60 mL/min Final  . GFR calc Af Amer 01/15/2016 >60  >60 mL/min Final   Comment: (NOTE) The eGFR has been calculated using the CKD EPI equation. This calculation has not been validated in all clinical situations. eGFR's persistently <60 mL/min signify possible Chronic Kidney Disease.   . Anion gap 01/15/2016 6  5 - 15 Final  . Magnesium 01/15/2016 2.0  1.7 - 2.4 mg/dL Final    Assessment:  Anthony Melendez is a 35 y.o. male with metastatic gastric cancer.  He presented with a 4-6 week history of progressive epigastric pain.  EGD on 08/02/2015 revealed a large, ulcerated, non-circumferential mass with oozing and stigmata of recent bleeding in  the entire examined stomach.  The mass involved the fundus down to the antrum.  Biopsies revealed poorly differentiated adenocarcinoma.  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  Her2/neu testing was negative.  Microsatellite testing was negative.  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  Chest, abdomen and pelvic CT scan on 07/29/2015 revealed a 4 cm thickened and irregular gastric wall compatible with an infiltrative malignancy. There was extensive involvement of the upper mesentery and omentum compatible with peritoneal carcinomatosis. There was a small amount of ascites.   Head CT on 08/08/2015 revealed no evidence of metastatic disease.  He completed treatment for H pylori  (began 08/09/2015).  Stool was negative for H pylori on 11/01/2015.  He has iron deficiency anemia likely gastric oozing and modest diet.  RBC are microcytic.  Labs on 08/21/2015 revealed a ferritin was 44 with an iron saturation 4% and a TIBC of 395.  He is s/p 9 cycles of FOLFOX chemotherapy (08/14/2015 - 12/11/2015).  Chemotherapy was held on 10/23/2015 secondary to thrombocytopenia (87,000).  He has tolerated his chemotherapy well without diarrhea, mouth sores, or neuropathy.  Abdomen and pelvic CT scan on 10/05/2015 revealed a positive response to therapy with decreased size of the primary mass along the lesser curvature of the stomach, decrease extension of the mass into the gastrohepatic ligament, decreasing evidence of peritoneal carcinomatosis involving predominantly the omentum, and resolution of previously noted malignant ascites. There was a potential ulcer along the greater curvature of the mid body of the stomach.  Abdomen and pelvic CT scan on 12/24/2015 revealed further improvement in primary gastric malignancy and adjacent  lymphadenopathy. There was no residual peritoneal carcinomatosis identified.   Plan: 1.  Labs today:  CBC with diff, CMP, Mg. 2.  Review CT scans.  Discuss continued excellent response with minimal side effects of treatment.  Discuss plan to complete 12 cycles of FOLFOX. 3.  Continue omeprazole. 4.  Cycle #11 FOLFOX, despite thrombocytopenia. 5.  RTC in 2 days for disconnect and Neulasta. 6.  RTC in 3 weeks for MD assessment,  labs (CBC with diff, CMP, Mg), and cycle #12 FOLFOX. 7.  thromocytopenia: Proceed with treatment as above. Monitor closely. Patient will have 3 weeks off until his next treatment.   Lloyd Huger, MD  01/17/2016, 6:08 PM

## 2016-02-01 ENCOUNTER — Other Ambulatory Visit: Payer: Self-pay | Admitting: *Deleted

## 2016-02-01 DIAGNOSIS — C169 Malignant neoplasm of stomach, unspecified: Secondary | ICD-10-CM

## 2016-02-03 NOTE — Progress Notes (Signed)
[facility] day:  02/05/16  Chief Complaint: Anthony Melendez is a 35 y.o. male with metastatic gastric cancer who is seen for assessment prior to cycle #12 FOLFOX chemotherapy.  HPI:  The patient was last seen by me on 01/01/2016.  At that time, he received cycle #10 FOLFOX without Neulasta.  Platelet count was 137,000.  Scans revealed further improvement in primary gastric malignancy and adjacent lymphadenopathy. There was no residual peritoneal carcinomatosis identified.  He saw Dr. Grayland Ormond on 01/15/2016 in my absence.  He denied any complaints.  Platelet count was 90,000.  He received cycle #11 FOLFOX chemotherapy with Neulasta support.  Symptomatically, he feels fine.  He notes 2 episodes of stomach pains on Friday and Saturday.  There were no precipitating events.  He notes that he had 3 fingers bother him during the interim (neuropathy), which are barely noticeable now.  He has cold sensitivity.  He denies any melena or hematochezia.   Past Medical History:  Diagnosis Date  . Gastric cancer ([facility]) 08/2015   Folfox chemo tx's.  Marland Kitchen GERD (gastroesophageal reflux disease)     Past Surgical History:  Procedure Laterality Date  . ESOPHAGOGASTRODUODENOSCOPY (EGD) WITH PROPOFOL N/A 08/02/2015   Procedure: ESOPHAGOGASTRODUODENOSCOPY (EGD) WITH PROPOFOL;  Surgeon: Lucilla Lame, MD;  Location: [facility];  Service: Endoscopy;  Laterality: N/A;  . NO PAST SURGERIES    . PERIPHERAL VASCULAR CATHETERIZATION N/A 08/13/2015   Procedure: Glori Luis Cath Insertion;  Surgeon: Algernon Huxley, MD;  Location: [facility];  Service: Cardiovascular;  Laterality: N/A;    History reviewed. No pertinent family history.  Social History:  reports that he has never smoked. He has never used smokeless tobacco. He reports that he does not drink alcohol or use drugs.  The patient is from [place].  He speaks [place].  He moves to the [place] in 2000.  He  lives in [place] with his mother, father, brother and sister.  He is a Theme park manager.  The patient is accompanied by his his sister, Verdis Frederickson, and the interpreter today.  Allergies: No Known Allergies  Current Medications: Current Outpatient Prescriptions  Medication Sig Dispense Refill  . ferrous sulfate 325 (65 FE) MG EC tablet Take 1 tablet (325 mg total) by mouth daily with breakfast. 30 tablet 3  . HYDROcodone-acetaminophen (NORCO) 5-325 MG tablet Take 1 tablet by mouth every 6 (six) hours as needed for moderate pain. 20 tablet 0  . omeprazole (PRILOSEC) 20 MG capsule Take 1 capsule (20 mg total) by mouth 2 (two) times daily before a meal. 60 capsule 2  . potassium chloride (K-DUR) 10 MEQ tablet Take 1 daily or as directed by dr Mike Gip 30 tablet 1  . prochlorperazine (COMPAZINE) 10 MG tablet Take 1 tablet (10 mg total) by mouth every 6 (six) hours as needed for nausea or vomiting. 30 tablet 1   No current facility-administered medications for this visit.    Facility-Administered Medications Ordered in Other Visits  Medication Dose Route Frequency Provider Last Rate Last Dose  . heparin lock flush 100 unit/mL  500 Units Intravenous Once Lequita Asal, MD      . heparin lock flush 100 unit/mL  500 Units Intracatheter Once PRN Lequita Asal, MD      . pegfilgrastim (NEULASTA) injection 6 mg  6 mg Subcutaneous Once Lequita Asal, MD      . sodium chloride 0.9 % injection 10 mL  10 mL Intravenous Once  Lequita Asal, MD      . sodium chloride flush (NS) 0.9 % injection 10 mL  10 mL Intravenous PRN Lequita Asal, MD   10 mL at 08/28/15 0835  . sodium chloride flush (NS) 0.9 % injection 10 mL  10 mL Intracatheter PRN Lequita Asal, MD        Review of Systems:  GENERAL:  Feels good.  No fevers or sweats.  Weight up 2 pounds. PERFORMANCE STATUS (ECOG):  1 HEENT:  No visual changes, runny nose, sore throat, mouth sores or tenderness. Lungs:  No shortness of breath or  cough.  No hemoptysis. Cardiac:  No chest pain, palpitations, orthopnea, or PND. GI:   Two episodes of abdominal pain in past 2 weeks.  No vomiting, diarrhea, constipation, melena or hematochezia. GU:  No urgency, frequency, dysuria, or hematuria. Musculoskeletal:  No bone pain.  No muscle tenderness.  Takes Claritin to prevent Neulasta induced bone pain. Extremities:  No pain or swelling. Skin:  No rashes or skin changes. Neuro:  Transient peripheral neuropathy, fading (see HPI).  Cold neuropathy.  No headache, weakness, balance or coordination issues. Endocrine:  No diabetes, thyroid issues, hot flashes or night sweats. Psych:  No mood changes, depression or anxiety. Pain:  No pain. Review of systems:  All other systems reviewed and found to be negative.  Physical Exam: Blood pressure 122/84, pulse 74, temperature 97 F (36.1 C), temperature source Tympanic, resp. rate 18, weight 186 lb 11.7 oz (84.7 kg). GENERAL:  Well developed, well nourished, gentleman sitting comfortably in the exam room in no acute distress. MENTAL STATUS:  Alert and oriented to person, place and time. HEAD:  Wearing an [employer] cap.  Short black hair.  Thin mustache.  Normocephalic, atraumatic, face symmetric, no Cushingoid features. EYES:  Brown eyes.  No conjunctivitis or scleral icterus. ENT:  Oropharynx clear without lesion.  Tongue normal. Mucous membranes moist.  RESPIRATORY:  Clear to auscultation without rales, wheezes or rhonchi. CARDIOVASCULAR:  Regular rate and rhythm without murmur, rub or gallop. ABDOMEN:  Soft, non-tender with active bowel sounds and no hepatosplenomegaly.  No masses. SKIN:  No rashes, ulcers or lesions. EXTREMITIES: No edema, no skin discoloration or tenderness.  No palpable cords. LYMPH NODES: No palpable cervical, supraclavicular, axillary or inguinal adenopathy  NEUROLOGICAL: Unremarkable. PSYCH:  Appropriate.  NEUROLOGICAL: Appropriate.   Infusion on 02/05/2016  Component  Date Value Ref Range Status  . Sodium 02/05/2016 136  135 - 145 mmol/L Final  . Potassium 02/05/2016 3.4* 3.5 - 5.1 mmol/L Final  . Chloride 02/05/2016 104  101 - 111 mmol/L Final  . CO2 02/05/2016 23  22 - 32 mmol/L Final  . Glucose, Bld 02/05/2016 196* 65 - 99 mg/dL Final  . BUN 02/05/2016 13  6 - 20 mg/dL Final  . Creatinine, Ser 02/05/2016 0.68  0.61 - 1.24 mg/dL Final  . Calcium 02/05/2016 8.7* 8.9 - 10.3 mg/dL Final  . Total Protein 02/05/2016 7.7  6.5 - 8.1 g/dL Final  . Albumin 02/05/2016 4.0  3.5 - 5.0 g/dL Final  . AST 02/05/2016 39  15 - 41 U/L Final  . ALT 02/05/2016 27  17 - 63 U/L Final  . Alkaline Phosphatase 02/05/2016 108  38 - 126 U/L Final  . Total Bilirubin 02/05/2016 0.7  0.3 - 1.2 mg/dL Final  . GFR calc non Af Amer 02/05/2016 >60  >60 mL/min Final  . GFR calc Af Amer 02/05/2016 >60  >60 mL/min Final  Comment: (NOTE) The eGFR has been calculated using the CKD EPI equation. This calculation has not been validated in all clinical situations. eGFR's persistently <60 mL/min signify possible Chronic Kidney Disease.   . Anion gap 02/05/2016 9  5 - 15 Final  . WBC 02/05/2016 5.2  3.8 - 10.6 K/uL Final  . RBC 02/05/2016 4.79  4.40 - 5.90 MIL/uL Final  . Hemoglobin 02/05/2016 14.8  13.0 - 18.0 g/dL Final  . HCT 02/05/2016 42.3  40.0 - 52.0 % Final  . MCV 02/05/2016 88.3  80.0 - 100.0 fL Final  . MCH 02/05/2016 31.0  26.0 - 34.0 pg Final  . MCHC 02/05/2016 35.1  32.0 - 36.0 g/dL Final  . RDW 02/05/2016 18.3* 11.5 - 14.5 % Final  . Platelets 02/05/2016 124* 150 - 440 K/uL Final  . Neutrophils Relative % 02/05/2016 59  % Final  . Neutro Abs 02/05/2016 3.0  1.4 - 6.5 K/uL Final  . Lymphocytes Relative 02/05/2016 27  % Final  . Lymphs Abs 02/05/2016 1.4  1.0 - 3.6 K/uL Final  . Monocytes Relative 02/05/2016 10  % Final  . Monocytes Absolute 02/05/2016 0.5  0.2 - 1.0 K/uL Final  . Eosinophils Relative 02/05/2016 1  % Final  . Eosinophils Absolute 02/05/2016 0.1  0 -  0.7 K/uL Final  . Basophils Relative 02/05/2016 3  % Final  . Basophils Absolute 02/05/2016 0.2* 0 - 0.1 K/uL Final  . Magnesium 02/05/2016 1.8  1.7 - 2.4 mg/dL Final    Assessment:  Anthony Melendez is a 35 y.o. male with metastatic gastric cancer.  He presented with a 4-6 week history of progressive epigastric pain.  EGD on 08/02/2015 revealed a large, ulcerated, non-circumferential mass with oozing and stigmata of recent bleeding in the entire examined stomach.  The mass involved the fundus down to the antrum.  Biopsies revealed poorly differentiated adenocarcinoma.  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  Her2/neu testing was negative.  Microsatellite testing was negative.  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  Chest, abdomen and pelvic CT scan on 07/29/2015 revealed a 4 cm thickened and irregular gastric wall compatible with an infiltrative malignancy. There was extensive involvement of the upper mesentery and omentum compatible with peritoneal carcinomatosis. There was a small amount of ascites.   Head CT on 08/08/2015 revealed no evidence of metastatic disease.  He completed treatment for H pylori  (began 08/09/2015).  Stool was negative for H pylori on 11/01/2015.  He has iron deficiency anemia likely gastric oozing and modest diet.  RBC are microcytic.  Labs on 08/21/2015 revealed a ferritin was 44 with an iron saturation 4% and a TIBC of 395.  He is s/p 11 cycles of FOLFOX chemotherapy (08/14/2015 - 01/15/2016).  Chemotherapy was held on 10/23/2015 secondary to thrombocytopenia (87,000).  Platelet count was 90,000 prior to cycle #11.  He has tolerated his chemotherapy well without diarrhea, mouth sores, or neuropathy.  Abdomen and pelvic CT scan on 10/05/2015 revealed a positive response to therapy with  decreased size of the primary mass along the lesser curvature of the stomach, decrease extension of the mass into the gastrohepatic ligament, decreasing evidence of peritoneal carcinomatosis involving predominantly the omentum, and resolution of previously noted malignant ascites. There was a potential ulcer along the greater curvature of the mid body of the stomach.  Abdomen and pelvic CT scan on  12/24/2015 revealed further improvement in primary gastric malignancy and adjacent lymphadenopathy. There was no residual peritoneal carcinomatosis identified.  Symptomatically, has a fading grade I peripheral neuropathy.  He has a cold neuropathy.  He had 2 days of abdominal discomfort.  Platelet count is 124,000.  Plan: 1.  Labs today:  CBC with diff, CMP, Mg. 2.  Discuss plan for follow-up EGD.  Discuss switch in therapy after 12 cycles of FOLFOX to maintenance 5FU/LV. 3.  Cycle #12 FOLFOX. 4.  RTC in 2 days for disconnect and Neulasta. 5.  Schedule follow-up appontment with Dr. Allen Norris. 6.  Rx:  Compazine 10 mg po q 6 hours prn nausea. 7.  Patient to take potassium chloride 10 meq BID x 2 days. 8.  Patient to take omeprazole. 9.  RTC in 2 weeks for MD assessment, labs (CBC with diff, CMP, Mg), and cycle #1 maintenance 5FU/LV.   Lequita Asal, MD  02/05/2016, 10:41 AM

## 2016-02-04 ENCOUNTER — Other Ambulatory Visit: Payer: Self-pay | Admitting: Hematology and Oncology

## 2016-02-04 DIAGNOSIS — C168 Malignant neoplasm of overlapping sites of stomach: Secondary | ICD-10-CM

## 2016-02-05 ENCOUNTER — Encounter: Payer: Self-pay | Admitting: Hematology and Oncology

## 2016-02-05 ENCOUNTER — Inpatient Hospital Stay: Payer: Self-pay | Attending: Hematology and Oncology | Admitting: Hematology and Oncology

## 2016-02-05 ENCOUNTER — Inpatient Hospital Stay: Payer: Self-pay

## 2016-02-05 ENCOUNTER — Telehealth: Payer: Self-pay | Admitting: Gastroenterology

## 2016-02-05 VITALS — BP 122/84 | HR 74 | Temp 97.0°F | Resp 18 | Wt 186.7 lb

## 2016-02-05 DIAGNOSIS — K2970 Gastritis, unspecified, without bleeding: Secondary | ICD-10-CM

## 2016-02-05 DIAGNOSIS — E876 Hypokalemia: Secondary | ICD-10-CM

## 2016-02-05 DIAGNOSIS — T451X5A Adverse effect of antineoplastic and immunosuppressive drugs, initial encounter: Secondary | ICD-10-CM

## 2016-02-05 DIAGNOSIS — K219 Gastro-esophageal reflux disease without esophagitis: Secondary | ICD-10-CM | POA: Insufficient documentation

## 2016-02-05 DIAGNOSIS — Z1509 Genetic susceptibility to other malignant neoplasm: Secondary | ICD-10-CM

## 2016-02-05 DIAGNOSIS — Z8619 Personal history of other infectious and parasitic diseases: Secondary | ICD-10-CM

## 2016-02-05 DIAGNOSIS — G629 Polyneuropathy, unspecified: Secondary | ICD-10-CM

## 2016-02-05 DIAGNOSIS — Z7689 Persons encountering health services in other specified circumstances: Secondary | ICD-10-CM

## 2016-02-05 DIAGNOSIS — C169 Malignant neoplasm of stomach, unspecified: Secondary | ICD-10-CM

## 2016-02-05 DIAGNOSIS — B9681 Helicobacter pylori [H. pylori] as the cause of diseases classified elsewhere: Secondary | ICD-10-CM

## 2016-02-05 DIAGNOSIS — C168 Malignant neoplasm of overlapping sites of stomach: Secondary | ICD-10-CM

## 2016-02-05 DIAGNOSIS — D509 Iron deficiency anemia, unspecified: Secondary | ICD-10-CM

## 2016-02-05 DIAGNOSIS — C786 Secondary malignant neoplasm of retroperitoneum and peritoneum: Secondary | ICD-10-CM

## 2016-02-05 DIAGNOSIS — R718 Other abnormality of red blood cells: Secondary | ICD-10-CM

## 2016-02-05 DIAGNOSIS — C801 Malignant (primary) neoplasm, unspecified: Secondary | ICD-10-CM

## 2016-02-05 DIAGNOSIS — Z5111 Encounter for antineoplastic chemotherapy: Secondary | ICD-10-CM | POA: Insufficient documentation

## 2016-02-05 DIAGNOSIS — Z79899 Other long term (current) drug therapy: Secondary | ICD-10-CM

## 2016-02-05 DIAGNOSIS — R1013 Epigastric pain: Secondary | ICD-10-CM

## 2016-02-05 DIAGNOSIS — G620 Drug-induced polyneuropathy: Secondary | ICD-10-CM

## 2016-02-05 LAB — COMPREHENSIVE METABOLIC PANEL
ALT: 27 U/L (ref 17–63)
AST: 39 U/L (ref 15–41)
Albumin: 4 g/dL (ref 3.5–5.0)
Alkaline Phosphatase: 108 U/L (ref 38–126)
Anion gap: 9 (ref 5–15)
BUN: 13 mg/dL (ref 6–20)
CO2: 23 mmol/L (ref 22–32)
Calcium: 8.7 mg/dL — ABNORMAL LOW (ref 8.9–10.3)
Chloride: 104 mmol/L (ref 101–111)
Creatinine, Ser: 0.68 mg/dL (ref 0.61–1.24)
GFR calc Af Amer: >60 mL/min (ref >60)
GFR calc non Af Amer: >60 mL/min (ref >60)
Glucose, Bld: 196 mg/dL — ABNORMAL HIGH (ref 65–99)
Potassium: 3.4 mmol/L — ABNORMAL LOW (ref 3.5–5.1)
Sodium: 136 mmol/L (ref 135–145)
Total Bilirubin: 0.7 mg/dL (ref 0.3–1.2)
Total Protein: 7.7 g/dL (ref 6.5–8.1)

## 2016-02-05 LAB — CBC WITH DIFFERENTIAL/PLATELET
Basophils Absolute: 0.2 10*3/uL — ABNORMAL HIGH (ref 0–0.1)
Basophils Relative: 3 %
Eosinophils Absolute: 0.1 10*3/uL (ref 0–0.7)
Eosinophils Relative: 1 %
HCT: 42.3 % (ref 40.0–52.0)
Hemoglobin: 14.8 g/dL (ref 13.0–18.0)
Lymphocytes Relative: 27 %
Lymphs Abs: 1.4 10*3/uL (ref 1.0–3.6)
MCH: 31 pg (ref 26.0–34.0)
MCHC: 35.1 g/dL (ref 32.0–36.0)
MCV: 88.3 fL (ref 80.0–100.0)
Monocytes Absolute: 0.5 10*3/uL (ref 0.2–1.0)
Monocytes Relative: 10 %
Neutro Abs: 3 10*3/uL (ref 1.4–6.5)
Neutrophils Relative %: 59 %
Platelets: 124 10*3/uL — ABNORMAL LOW (ref 150–440)
RBC: 4.79 MIL/uL (ref 4.40–5.90)
RDW: 18.3 % — ABNORMAL HIGH (ref 11.5–14.5)
WBC: 5.2 10*3/uL (ref 3.8–10.6)

## 2016-02-05 LAB — MAGNESIUM: Magnesium: 1.8 mg/dL (ref 1.7–2.4)

## 2016-02-05 MED ORDER — FLUOROURACIL CHEMO INJECTION 2.5 GM/50ML
400.0000 mg/m2 | Freq: Once | INTRAVENOUS | Status: AC
  Administered 2016-02-05: 800 mg via INTRAVENOUS
  Filled 2016-02-05: qty 16

## 2016-02-05 MED ORDER — DEXTROSE 5 % IV SOLN
85.0000 mg/m2 | Freq: Once | INTRAVENOUS | Status: AC
  Administered 2016-02-05: 165 mg via INTRAVENOUS
  Filled 2016-02-05: qty 33

## 2016-02-05 MED ORDER — DEXAMETHASONE SODIUM PHOSPHATE 10 MG/ML IJ SOLN
10.0000 mg | Freq: Once | INTRAMUSCULAR | Status: AC
  Administered 2016-02-05: 10 mg via INTRAVENOUS
  Filled 2016-02-05: qty 1

## 2016-02-05 MED ORDER — PALONOSETRON HCL INJECTION 0.25 MG/5ML
0.2500 mg | Freq: Once | INTRAVENOUS | Status: AC
  Administered 2016-02-05: 0.25 mg via INTRAVENOUS
  Filled 2016-02-05: qty 5

## 2016-02-05 MED ORDER — SODIUM CHLORIDE 0.9 % IV SOLN
2400.0000 mg/m2 | INTRAVENOUS | Status: DC
  Administered 2016-02-05: 4700 mg via INTRAVENOUS
  Filled 2016-02-05: qty 94

## 2016-02-05 MED ORDER — DEXTROSE 5 % IV SOLN
800.0000 mg | Freq: Once | INTRAVENOUS | Status: AC
  Administered 2016-02-05: 800 mg via INTRAVENOUS
  Filled 2016-02-05: qty 40

## 2016-02-05 MED ORDER — PROCHLORPERAZINE MALEATE 10 MG PO TABS: 10.0000 mg | ORAL_TABLET | Freq: Four times a day (QID) | ORAL | 1 refills | Status: DC | PRN

## 2016-02-05 MED ORDER — SODIUM CHLORIDE 0.9 % IV SOLN: 10.0000 mg | Freq: Once | INTRAVENOUS | Status: DC

## 2016-02-05 MED ORDER — DEXTROSE 5 % IV SOLN
Freq: Once | INTRAVENOUS | Status: AC
  Administered 2016-02-05: 11:00:00 via INTRAVENOUS
  Filled 2016-02-05: qty 1000

## 2016-02-05 NOTE — Telephone Encounter (Signed)
Colette called from the [facility] and stated that this patient needs to have another colonoscopy and EGD to be re-evaluated. Please call

## 2016-02-05 NOTE — Progress Notes (Signed)
Patient states he sometimes gets nauseated.  Nothing today.  Also states a few days ago he had pain in his mid abdomen.  No pain today. Patient requesting refill for compazine.

## 2016-02-06 ENCOUNTER — Other Ambulatory Visit: Payer: Self-pay

## 2016-02-06 NOTE — Telephone Encounter (Signed)
Called patient to schedule his Endoscopy for 02/26/2016. I had offered him on 02/19/2016 but he stated that he had an appointment scheduled with Dr. Mike Gip on that date. Therefore, his Endoscopy will be done on the following week. Patient's information (in Spanish) will be mailed to him. Patient understood and had no further questions.

## 2016-02-07 ENCOUNTER — Inpatient Hospital Stay: Payer: Self-pay

## 2016-02-07 VITALS — BP 116/75 | HR 62 | Temp 97.4°F | Resp 16

## 2016-02-07 DIAGNOSIS — C168 Malignant neoplasm of overlapping sites of stomach: Secondary | ICD-10-CM

## 2016-02-07 MED ORDER — HEPARIN SOD (PORK) LOCK FLUSH 100 UNIT/ML IV SOLN
500.0000 [IU] | Freq: Once | INTRAVENOUS | Status: AC | PRN
  Administered 2016-02-07: 500 [IU]
  Filled 2016-02-07: qty 5

## 2016-02-07 MED ORDER — SODIUM CHLORIDE 0.9% FLUSH
10.0000 mL | INTRAVENOUS | Status: DC | PRN
  Administered 2016-02-07: 10 mL
  Filled 2016-02-07: qty 10

## 2016-02-07 MED ORDER — PEGFILGRASTIM INJECTION 6 MG/0.6ML ~~LOC~~
6.0000 mg | PREFILLED_SYRINGE | Freq: Once | SUBCUTANEOUS | Status: AC
  Administered 2016-02-07: 6 mg via SUBCUTANEOUS
  Filled 2016-02-07: qty 0.6

## 2016-02-07 NOTE — Telephone Encounter (Signed)
EGD at [facility] on 02/26/16 with Wohl. Please precert for Malignant Neoplasm of stomach C16.9 (stomach cancer)

## 2016-02-08 NOTE — Telephone Encounter (Signed)
The notification/prior authorization case information was transmitted on 02/08/2016 at 10:00 AM CST. The notification/prior authorization reference number is [id number].

## 2016-02-19 ENCOUNTER — Inpatient Hospital Stay: Payer: Self-pay

## 2016-02-19 ENCOUNTER — Other Ambulatory Visit: Payer: Self-pay | Admitting: *Deleted

## 2016-02-19 ENCOUNTER — Telehealth: Payer: Self-pay | Admitting: *Deleted

## 2016-02-19 ENCOUNTER — Encounter: Payer: Self-pay | Admitting: Hematology and Oncology

## 2016-02-19 ENCOUNTER — Inpatient Hospital Stay (HOSPITAL_BASED_OUTPATIENT_CLINIC_OR_DEPARTMENT_OTHER): Payer: Self-pay | Admitting: Hematology and Oncology

## 2016-02-19 ENCOUNTER — Other Ambulatory Visit: Payer: Self-pay | Admitting: Hematology and Oncology

## 2016-02-19 VITALS — BP 136/86 | HR 61 | Temp 97.0°F | Resp 18 | Wt 185.4 lb

## 2016-02-19 DIAGNOSIS — B9681 Helicobacter pylori [H. pylori] as the cause of diseases classified elsewhere: Secondary | ICD-10-CM

## 2016-02-19 DIAGNOSIS — K2970 Gastritis, unspecified, without bleeding: Secondary | ICD-10-CM

## 2016-02-19 DIAGNOSIS — C786 Secondary malignant neoplasm of retroperitoneum and peritoneum: Secondary | ICD-10-CM

## 2016-02-19 DIAGNOSIS — Z79899 Other long term (current) drug therapy: Secondary | ICD-10-CM

## 2016-02-19 DIAGNOSIS — K219 Gastro-esophageal reflux disease without esophagitis: Secondary | ICD-10-CM

## 2016-02-19 DIAGNOSIS — C168 Malignant neoplasm of overlapping sites of stomach: Secondary | ICD-10-CM

## 2016-02-19 DIAGNOSIS — C801 Malignant (primary) neoplasm, unspecified: Secondary | ICD-10-CM

## 2016-02-19 DIAGNOSIS — Z7689 Persons encountering health services in other specified circumstances: Secondary | ICD-10-CM

## 2016-02-19 DIAGNOSIS — E876 Hypokalemia: Secondary | ICD-10-CM

## 2016-02-19 DIAGNOSIS — Z8619 Personal history of other infectious and parasitic diseases: Secondary | ICD-10-CM

## 2016-02-19 DIAGNOSIS — R718 Other abnormality of red blood cells: Secondary | ICD-10-CM

## 2016-02-19 DIAGNOSIS — D696 Thrombocytopenia, unspecified: Secondary | ICD-10-CM

## 2016-02-19 DIAGNOSIS — C169 Malignant neoplasm of stomach, unspecified: Secondary | ICD-10-CM

## 2016-02-19 DIAGNOSIS — G629 Polyneuropathy, unspecified: Secondary | ICD-10-CM

## 2016-02-19 DIAGNOSIS — D509 Iron deficiency anemia, unspecified: Secondary | ICD-10-CM

## 2016-02-19 DIAGNOSIS — Z1509 Genetic susceptibility to other malignant neoplasm: Secondary | ICD-10-CM

## 2016-02-19 LAB — CBC WITH DIFFERENTIAL/PLATELET
Basophils Absolute: 0.1 10*3/uL (ref 0–0.1)
Basophils Relative: 1 %
Eosinophils Absolute: 0.1 10*3/uL (ref 0–0.7)
Eosinophils Relative: 2 %
HCT: 41.1 % (ref 40.0–52.0)
Hemoglobin: 14.5 g/dL (ref 13.0–18.0)
Lymphocytes Relative: 20 %
Lymphs Abs: 1.4 10*3/uL (ref 1.0–3.6)
MCH: 31.6 pg (ref 26.0–34.0)
MCHC: 35.2 g/dL (ref 32.0–36.0)
MCV: 89.7 fL (ref 80.0–100.0)
Monocytes Absolute: 0.5 10*3/uL (ref 0.2–1.0)
Monocytes Relative: 7 %
Neutro Abs: 5 10*3/uL (ref 1.4–6.5)
Neutrophils Relative %: 70 %
Platelets: 90 10*3/uL — ABNORMAL LOW (ref 150–440)
RBC: 4.58 MIL/uL (ref 4.40–5.90)
RDW: 17.9 % — ABNORMAL HIGH (ref 11.5–14.5)
WBC: 7.2 10*3/uL (ref 3.8–10.6)

## 2016-02-19 LAB — MAGNESIUM: Magnesium: 2 mg/dL (ref 1.7–2.4)

## 2016-02-19 LAB — COMPREHENSIVE METABOLIC PANEL
ALT: 28 U/L (ref 17–63)
AST: 37 U/L (ref 15–41)
Albumin: 4.1 g/dL (ref 3.5–5.0)
Alkaline Phosphatase: 147 U/L — ABNORMAL HIGH (ref 38–126)
Anion gap: 7 (ref 5–15)
BUN: 11 mg/dL (ref 6–20)
CO2: 23 mmol/L (ref 22–32)
Calcium: 8.7 mg/dL — ABNORMAL LOW (ref 8.9–10.3)
Chloride: 107 mmol/L (ref 101–111)
Creatinine, Ser: 0.59 mg/dL — ABNORMAL LOW (ref 0.61–1.24)
GFR calc Af Amer: >60 mL/min (ref >60)
GFR calc non Af Amer: >60 mL/min (ref >60)
Glucose, Bld: 172 mg/dL — ABNORMAL HIGH (ref 65–99)
Potassium: 3.4 mmol/L — ABNORMAL LOW (ref 3.5–5.1)
Sodium: 137 mmol/L (ref 135–145)
Total Bilirubin: 0.5 mg/dL (ref 0.3–1.2)
Total Protein: 7.7 g/dL (ref 6.5–8.1)

## 2016-02-19 MED ORDER — POTASSIUM CHLORIDE ER 10 MEQ PO TBCR: EXTENDED_RELEASE_TABLET | ORAL | 0 refills | Status: DC

## 2016-02-19 MED ORDER — SODIUM CHLORIDE 0.9% FLUSH
10.0000 mL | INTRAVENOUS | Status: DC | PRN
  Administered 2016-02-19: 10 mL via INTRAVENOUS
  Filled 2016-02-19: qty 10

## 2016-02-19 MED ORDER — HEPARIN SOD (PORK) LOCK FLUSH 100 UNIT/ML IV SOLN
500.0000 [IU] | Freq: Once | INTRAVENOUS | Status: AC
Start: 2016-02-19 — End: 2016-02-19
  Administered 2016-02-19: 500 [IU] via INTRAVENOUS
  Filled 2016-02-19: qty 5

## 2016-02-19 NOTE — Progress Notes (Signed)
Patient states after eating when his stomach is full his pain that is usually at 2/10 increases to 5/10. The pain then radiates into his back.  States when he moves around after eating a meal he gets SOB also.

## 2016-02-19 NOTE — Progress Notes (Signed)
[facility] day:  02/19/16  Chief Complaint: Anthony Melendez is a 36 y.o. male with metastatic gastric cancer who is seen for assessment prior to cycle #1 5FU/LV chemotherapy.  HPI:  The patient was last seen by me on 02/05/2016  At that time, he described 2 days of epigastric discomfort.  He had a fading grade I peripheral neuropathy.  Platelet count was 137,000.  He received cycle #12 FOLFOX without Neulasta support.  We discussed follow-up with Dr. Allen Norris.  He was scheduled for EGD on 02/26/2016.  Symptomatically, he feels fine.  He describes eating, getting full, and having abdominal pain. Pain is a level 2 out of 10.   Past Medical History:  Diagnosis Date  . Gastric cancer ([place]) 08/2015   Folfox chemo tx's.  Marland Kitchen GERD (gastroesophageal reflux disease)     Past Surgical History:  Procedure Laterality Date  . ESOPHAGOGASTRODUODENOSCOPY (EGD) WITH PROPOFOL N/A 08/02/2015   Procedure: ESOPHAGOGASTRODUODENOSCOPY (EGD) WITH PROPOFOL;  Surgeon: Lucilla Lame, MD;  Location: [facility];  Service: Endoscopy;  Laterality: N/A;  . NO PAST SURGERIES    . PERIPHERAL VASCULAR CATHETERIZATION N/A 08/13/2015   Procedure: Glori Luis Cath Insertion;  Surgeon: Algernon Huxley, MD;  Location: [facility];  Service: Cardiovascular;  Laterality: N/A;    History reviewed. No pertinent family history.  Social History:  reports that he has never smoked. He has never used smokeless tobacco. He reports that he does not drink alcohol or use drugs.  The patient is from [place].  He speaks [place].  He moves to the [place] in 2000.  He lives in [place] with his mother, father, brother and sister.  He is a Theme park manager.  The patient is accompanied by his his brother and the interpreter today.  Allergies: No Known Allergies  Current Medications: Current Outpatient Prescriptions  Medication Sig Dispense Refill  . ferrous sulfate 325 (65 FE) MG EC tablet Take 1  tablet (325 mg total) by mouth daily with breakfast. 30 tablet 3  . HYDROcodone-acetaminophen (NORCO) 5-325 MG tablet Take 1 tablet by mouth every 6 (six) hours as needed for moderate pain. 20 tablet 0  . omeprazole (PRILOSEC) 20 MG capsule Take 1 capsule (20 mg total) by mouth 2 (two) times daily before a meal. 60 capsule 2  . potassium chloride (K-DUR) 10 MEQ tablet Take 1 daily or as directed by dr Mike Gip 30 tablet 1  . prochlorperazine (COMPAZINE) 10 MG tablet Take 1 tablet (10 mg total) by mouth every 6 (six) hours as needed for nausea or vomiting. 30 tablet 1   No current facility-administered medications for this visit.    Facility-Administered Medications Ordered in Other Visits  Medication Dose Route Frequency Provider Last Rate Last Dose  . heparin lock flush 100 unit/mL  500 Units Intravenous Once Lequita Asal, MD      . pegfilgrastim (NEULASTA) injection 6 mg  6 mg Subcutaneous Once Lequita Asal, MD      . sodium chloride 0.9 % injection 10 mL  10 mL Intravenous Once Lequita Asal, MD      . sodium chloride flush (NS) 0.9 % injection 10 mL  10 mL Intravenous PRN Lequita Asal, MD   10 mL at 08/28/15 8921    Review of Systems:  GENERAL:  Feels good.  No fevers or sweats.  Weight down 1 pound. PERFORMANCE STATUS (ECOG):  1 HEENT:  No visual changes, runny nose, sore  throat, mouth sores or tenderness. Lungs:  No shortness of breath or cough.  No hemoptysis. Cardiac:  No chest pain, palpitations, orthopnea, or PND. GI:   Abdominal pain after eating (see HPI).  No nausea, vomiting, diarrhea, constipation, melena or hematochezia. GU:  No urgency, frequency, dysuria, or hematuria. Musculoskeletal:  No bone pain.  No muscle tenderness.  Takes Claritin to prevent Neulasta induced bone pain. Extremities:  No pain or swelling. Skin:  No rashes or skin changes. Neuro:  No headache, numbness or weakness, balance or coordination issues. Endocrine:  No diabetes,  thyroid issues, hot flashes or night sweats. Psych:  No mood changes, depression or anxiety. Pain:  Epigastric discomfort. Review of systems:  All other systems reviewed and found to be negative.  Physical Exam: Blood pressure 136/86, pulse 61, temperature 97 F (36.1 C), temperature source Tympanic, resp. rate 18, weight 185 lb 6.5 oz (84.1 kg). GENERAL:  Well developed, well nourished, gentleman sitting comfortably in the exam room in no acute distress. MENTAL STATUS:  Alert and oriented to person, place and time. HEAD:  Wearing a blue cap.  Black hair.  Thin mustache.  Normocephalic, atraumatic, face symmetric, no Cushingoid features. EYES:  Brown eyes.  No conjunctivitis or scleral icterus. ENT:  Oropharynx clear without lesion.  Tongue normal. Mucous membranes moist.  RESPIRATORY:  Clear to auscultation without rales, wheezes or rhonchi. CARDIOVASCULAR:  Regular rate and rhythm without murmur, rub or gallop. ABDOMEN:  Soft, minimally tender in the epigastric region without guarding or rebound tenderness.  Active bowel sounds and no hepatosplenomegaly.  No masses. SKIN:  No rashes, ulcers or lesions. EXTREMITIES: No edema, no skin discoloration or tenderness.  No palpable cords. LYMPH NODES: No palpable cervical, supraclavicular, axillary or inguinal adenopathy  NEUROLOGICAL: Unremarkable. PSYCH:  Appropriate.  NEUROLOGICAL: Appropriate.   Appointment on 02/19/2016  Component Date Value Ref Range Status  . WBC 02/19/2016 7.2  3.8 - 10.6 K/uL Final  . RBC 02/19/2016 4.58  4.40 - 5.90 MIL/uL Final  . Hemoglobin 02/19/2016 14.5  13.0 - 18.0 g/dL Final  . HCT 02/19/2016 41.1  40.0 - 52.0 % Final  . MCV 02/19/2016 89.7  80.0 - 100.0 fL Final  . MCH 02/19/2016 31.6  26.0 - 34.0 pg Final  . MCHC 02/19/2016 35.2  32.0 - 36.0 g/dL Final  . RDW 02/19/2016 17.9* 11.5 - 14.5 % Final  . Platelets 02/19/2016 90* 150 - 440 K/uL Final  . Neutrophils Relative % 02/19/2016 70  % Final  . Neutro  Abs 02/19/2016 5.0  1.4 - 6.5 K/uL Final  . Lymphocytes Relative 02/19/2016 20  % Final  . Lymphs Abs 02/19/2016 1.4  1.0 - 3.6 K/uL Final  . Monocytes Relative 02/19/2016 7  % Final  . Monocytes Absolute 02/19/2016 0.5  0.2 - 1.0 K/uL Final  . Eosinophils Relative 02/19/2016 2  % Final  . Eosinophils Absolute 02/19/2016 0.1  0 - 0.7 K/uL Final  . Basophils Relative 02/19/2016 1  % Final  . Basophils Absolute 02/19/2016 0.1  0 - 0.1 K/uL Final    Assessment:  Anthony Melendez is a 36 y.o. male with metastatic gastric cancer.  He presented with a 4-6 week history of progressive epigastric pain.  EGD on 08/02/2015 revealed a large, ulcerated, non-circumferential mass with oozing and stigmata of recent bleeding in the entire examined stomach.  The mass involved the fundus down to the antrum.  Biopsies revealed poorly differentiated adenocarcinoma.  There was moderate chronic active Helicobacter associated  gastritis.  Her2/neu testing was negative.  Microsatellite testing was negative.  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  Chest, abdomen and pelvic CT scan on 07/29/2015 revealed a 4 cm thickened and irregular gastric wall compatible with an infiltrative malignancy. There was extensive involvement of the upper mesentery and omentum compatible with peritoneal carcinomatosis. There was a small amount of ascites.   Head CT on 08/08/2015 revealed no evidence of metastatic disease.  He completed treatment for H pylori  (began 08/09/2015).  Stool was negative for H pylori on 11/01/2015.  He has iron deficiency anemia likely gastric oozing and modest diet.  RBC are microcytic.  Labs on 08/21/2015 revealed a ferritin was 44 with an iron saturation 4% and a TIBC of 395.  Ferritin was 116 on 09/18/2015.  He received 12 cycles of FOLFOX chemotherapy  (08/14/2015 - 02/05/2016).  Chemotherapy was held on 10/23/2015 secondary to thrombocytopenia (87,000).  Platelet count was 90,000 prior to cycle #11.  He has tolerated his chemotherapy well without diarrhea, mouth sores, or neuropathy.  Abdomen and pelvic CT scan on 10/05/2015 revealed a positive response to therapy with decreased size of the primary mass along the lesser curvature of the stomach, decrease extension of the mass into the gastrohepatic ligament, decreasing evidence of peritoneal carcinomatosis involving predominantly the omentum, and resolution of previously noted malignant ascites. There was a potential ulcer along the greater curvature of the mid body of the stomach.  Abdomen and pelvic CT scan on 12/24/2015 revealed further improvement in primary gastric malignancy and adjacent lymphadenopathy. There was no residual peritoneal carcinomatosis identified.  Symptomatically, he has intermittent epigastric pain.  Exam reveals minimal epigastric tenderness.  Platelet count is 90,000.  Plan: 1.  Labs today:  CBC with diff, CMP, Mg. 2.  Postpone cycle #1 5FU and LV.  Discuss plan for no Neulasta with 5FU/LV alone. 3.  Anticipate EGD on 02/26/2016. 4.  Continue omeprazole. 5.  RTC on 03/03/2016 for MD assessment, labs (CBC with diff, CMP, Mg), and cycle #1 maintenance 5FU/LV.   Lequita Asal, MD  02/19/2016, 9:11 AM

## 2016-02-19 NOTE — Telephone Encounter (Signed)
Called patient via interpreter Chip Boer) to inform him that MD wants him to start back on potassium 10 meq per day.  Patient verbalized understanding.  States he has plenty and does not need refill.

## 2016-02-19 NOTE — Telephone Encounter (Signed)
-----   Message from Lequita Asal, MD sent at 02/19/2016 11:19 AM EST ----- Regarding: Please call patient  He should take potassium chloride 10 meq po q day.  M  ----- Message ----- From: Interface, Lab In [place] Sent: 02/19/2016   9:10 AM To: Lequita Asal, MD

## 2016-02-19 NOTE — Telephone Encounter (Signed)
Patient called back via interpreter to ask for a refill on his potassium.  States when he told us he had plenty he was looking at another bottle.  He is out of potassium.  Leana Roe, RN e-scribed new prescription.

## 2016-02-21 ENCOUNTER — Inpatient Hospital Stay: Payer: Self-pay

## 2016-02-25 ENCOUNTER — Encounter: Payer: Self-pay | Admitting: *Deleted

## 2016-02-26 ENCOUNTER — Ambulatory Visit: Payer: Self-pay | Admitting: Certified Registered"

## 2016-02-26 ENCOUNTER — Encounter
Admission: RE | Disposition: A | Discharge status: Home / Self Care | Payer: Self-pay | Source: Ambulatory Visit | Attending: Gastroenterology

## 2016-02-26 ENCOUNTER — Ambulatory Visit
Admission: RE | Admit: 2016-02-26 | Discharge: 2016-02-26 | Disposition: A | Discharge status: Home / Self Care | Payer: 59 | Source: Ambulatory Visit | Attending: Gastroenterology | Admitting: Gastroenterology

## 2016-02-26 DIAGNOSIS — K257 Chronic gastric ulcer without hemorrhage or perforation: Secondary | ICD-10-CM

## 2016-02-26 DIAGNOSIS — C169 Malignant neoplasm of stomach, unspecified: Secondary | ICD-10-CM | POA: Insufficient documentation

## 2016-02-26 DIAGNOSIS — C162 Malignant neoplasm of body of stomach: Secondary | ICD-10-CM | POA: Diagnosis not present

## 2016-02-26 DIAGNOSIS — D649 Anemia, unspecified: Secondary | ICD-10-CM | POA: Insufficient documentation

## 2016-02-26 DIAGNOSIS — Z79899 Other long term (current) drug therapy: Secondary | ICD-10-CM | POA: Insufficient documentation

## 2016-02-26 DIAGNOSIS — K219 Gastro-esophageal reflux disease without esophagitis: Secondary | ICD-10-CM | POA: Insufficient documentation

## 2016-02-26 DIAGNOSIS — K2950 Unspecified chronic gastritis without bleeding: Secondary | ICD-10-CM | POA: Insufficient documentation

## 2016-02-26 DIAGNOSIS — K259 Gastric ulcer, unspecified as acute or chronic, without hemorrhage or perforation: Secondary | ICD-10-CM | POA: Insufficient documentation

## 2016-02-26 HISTORY — PX: ESOPHAGOGASTRODUODENOSCOPY (EGD) WITH PROPOFOL: SHX5813

## 2016-02-26 SURGERY — ESOPHAGOGASTRODUODENOSCOPY (EGD) WITH PROPOFOL
Anesthesia: General

## 2016-02-26 MED ORDER — PROPOFOL 500 MG/50ML IV EMUL
INTRAVENOUS | Status: AC
  Filled 2016-02-26: qty 50

## 2016-02-26 MED ORDER — LIDOCAINE HCL (PF) 2 % IJ SOLN
INTRAMUSCULAR | Status: AC
  Filled 2016-02-26: qty 2

## 2016-02-26 MED ORDER — LIDOCAINE HCL (CARDIAC) 20 MG/ML IV SOLN
INTRAVENOUS | Status: DC | PRN
  Administered 2016-02-26: 60 mg via INTRAVENOUS

## 2016-02-26 MED ORDER — MIDAZOLAM HCL 2 MG/2ML IJ SOLN
INTRAMUSCULAR | Status: AC
  Filled 2016-02-26: qty 2

## 2016-02-26 MED ORDER — PROPOFOL 10 MG/ML IV BOLUS
INTRAVENOUS | Status: DC | PRN
  Administered 2016-02-26: 70 mg via INTRAVENOUS

## 2016-02-26 MED ORDER — PROPOFOL 500 MG/50ML IV EMUL
INTRAVENOUS | Status: DC | PRN
  Administered 2016-02-26: 150 ug/kg/min via INTRAVENOUS

## 2016-02-26 MED ORDER — FENTANYL CITRATE (PF) 100 MCG/2ML IJ SOLN
INTRAMUSCULAR | Status: AC
  Filled 2016-02-26: qty 2

## 2016-02-26 MED ORDER — FENTANYL CITRATE (PF) 100 MCG/2ML IJ SOLN
INTRAMUSCULAR | Status: DC | PRN
  Administered 2016-02-26: 50 ug via INTRAVENOUS

## 2016-02-26 MED ORDER — SODIUM CHLORIDE 0.9 % IV SOLN
INTRAVENOUS | Status: DC
  Administered 2016-02-26: 1000 mL via INTRAVENOUS

## 2016-02-26 MED ORDER — GLYCOPYRROLATE 0.2 MG/ML IJ SOLN
INTRAMUSCULAR | Status: AC
  Filled 2016-02-26: qty 1

## 2016-02-26 MED ORDER — MIDAZOLAM HCL 2 MG/2ML IJ SOLN
INTRAMUSCULAR | Status: DC | PRN
  Administered 2016-02-26: 2 mg via INTRAVENOUS

## 2016-02-26 MED ORDER — GLYCOPYRROLATE 0.2 MG/ML IJ SOLN
INTRAMUSCULAR | Status: DC | PRN
  Administered 2016-02-26: 0.2 mg via INTRAVENOUS

## 2016-02-26 NOTE — Transfer of Care (Signed)
Immediate Anesthesia Transfer of Care Note  Patient: Anthony Melendez  Procedure(s) Performed: Procedure(s): ESOPHAGOGASTRODUODENOSCOPY (EGD) WITH PROPOFOL (N/A)  Patient Location: PACU  Anesthesia Type:General  Level of Consciousness: sedated  Airway & Oxygen Therapy: Patient Spontanous Breathing and Patient connected to nasal cannula oxygen  Post-op Assessment: Report given to RN and Post -op Vital signs reviewed and stable  Post vital signs: Reviewed and stable  Last Vitals:  Vitals:   02/26/16 0819 02/26/16 0846  BP: (!) 136/94 (!) 91/52  Pulse: 74 85  Resp: 20 12  Temp: 36.6 C     Last Pain:  Vitals:   02/26/16 0819  TempSrc: Tympanic         Complications: No apparent anesthesia complications

## 2016-02-26 NOTE — Anesthesia Procedure Notes (Signed)
Performed by: Kohler Pellerito Pre-anesthesia Checklist: Patient identified, Emergency Drugs available, Suction available, Patient being monitored and Timeout performed Patient Re-evaluated:Patient Re-evaluated prior to inductionOxygen Delivery Method: Nasal cannula Preoxygenation: Pre-oxygenation with 100% oxygen Intubation Type: IV induction       

## 2016-02-26 NOTE — Op Note (Signed)
[facility] Gastroenterology Patient Name: Anthony Melendez Procedure Date: 02/26/2016 8:33 AM MRN: [id number] Account #: [id number] Date of Birth: February 06, 1980 Admit Type: Outpatient Age: 36 Room: [facility] ENDO ROOM 4 Gender: Male Note Status: Finalized Procedure:            Upper GI endoscopy Indications:          Follow-up of malignant adenocarcinoma of the stomach Providers:            Lucilla Lame MD, MD Referring MD:         No Local Md, MD (Referring MD) Medicines:            Propofol per Anesthesia Complications:        No immediate complications. Procedure:            Pre-Anesthesia Assessment:                       - Prior to the procedure, a History and Physical was                        performed, and patient medications and allergies were                        reviewed. The patient's tolerance of previous                        anesthesia was also reviewed. The risks and benefits of                        the procedure and the sedation options and risks were                        discussed with the patient. All questions were                        answered, and informed consent was obtained. Prior                        Anticoagulants: The patient has taken no previous                        anticoagulant or antiplatelet agents. ASA Grade                        Assessment: II - A patient with mild systemic disease.                        After reviewing the risks and benefits, the patient was                        deemed in satisfactory condition to undergo the                        procedure.                       After obtaining informed consent, the endoscope was                        passed under direct vision. Throughout the procedure,  the patient's blood pressure, pulse, and oxygen                        saturations were monitored continuously. The Endoscope                        was introduced through the mouth, and  advanced to the                        second part of duodenum. The upper GI endoscopy was                        accomplished without difficulty. The patient tolerated                        the procedure well. Findings:      The examined esophagus was normal.      One non-bleeding superficial gastric ulcer with no stigmata of bleeding       was found in the gastric body. Biopsies were taken with a cold forceps       for histology.      The examined duodenum was normal. Impression:           - Normal esophagus.                       - Non-bleeding gastric ulcer with no stigmata of                        bleeding. Biopsied.                       - Normal examined duodenum.                       - The mass appears smaller then the previously seen. Recommendation:       - Await pathology results.                       - Discharge patient to home.                       - Resume previous diet.                       - Continue present medications.                       - Await pathology results. Procedure Code(s):    --- Professional ---                       [phone], Esophagogastroduodenoscopy, flexible, transoral;                        with biopsy, single or multiple Diagnosis Code(s):    --- Professional ---                       C16.9, Malignant neoplasm of stomach, unspecified                       K25.9, Gastric ulcer, unspecified as acute or chronic,  without hemorrhage or perforation CPT copyright 2016 American Medical Association. All rights reserved. The codes documented in this report are preliminary and upon coder review may  be revised to meet current compliance requirements. Lucilla Lame MD, MD 02/26/2016 8:45:00 AM This report has been signed electronically. Number of Addenda: 0 Note Initiated On: 02/26/2016 8:33 AM      [facility]

## 2016-02-26 NOTE — Anesthesia Post-op Follow-up Note (Cosign Needed)
Anesthesia QCDR form completed.        

## 2016-02-26 NOTE — H&P (Signed)
  Lucilla Lame, MD [address] [place], [place] [address] Phone: [phone] Fax : [phone]  Primary Care Physician:  No PCP Per Patient Primary Gastroenterologist:  Dr. Allen Norris  Pre-Procedure History & Physical: HPI:  Anthony Melendez is a 36 y.o. male is here for an endoscopy.   Past Medical History:  Diagnosis Date  . Gastric cancer ([place]) 08/2015   Folfox chemo tx's.  Marland Kitchen GERD (gastroesophageal reflux disease)     Past Surgical History:  Procedure Laterality Date  . ESOPHAGOGASTRODUODENOSCOPY (EGD) WITH PROPOFOL N/A 08/02/2015   Procedure: ESOPHAGOGASTRODUODENOSCOPY (EGD) WITH PROPOFOL;  Surgeon: Lucilla Lame, MD;  Location: [facility];  Service: Endoscopy;  Laterality: N/A;  . NO PAST SURGERIES    . PERIPHERAL VASCULAR CATHETERIZATION N/A 08/13/2015   Procedure: Glori Luis Cath Insertion;  Surgeon: Algernon Huxley, MD;  Location: [facility];  Service: Cardiovascular;  Laterality: N/A;    Prior to Admission medications   Medication Sig Start Date End Date Taking? Authorizing Provider  ferrous sulfate 325 (65 FE) MG EC tablet Take 1 tablet (325 mg total) by mouth daily with breakfast. 08/28/15  Yes Lequita Asal, MD  HYDROcodone-acetaminophen (NORCO) 5-325 MG tablet Take 1 tablet by mouth every 6 (six) hours as needed for moderate pain. 11/13/15  Yes Lequita Asal, MD  omeprazole (PRILOSEC) 20 MG capsule Take 1 capsule (20 mg total) by mouth 2 (two) times daily before a meal. 01/01/16  Yes Lequita Asal, MD  potassium chloride (K-DUR) 10 MEQ tablet Take 1 tablet daily 02/19/16  Yes Lequita Asal, MD  prochlorperazine (COMPAZINE) 10 MG tablet Take 1 tablet (10 mg total) by mouth every 6 (six) hours as needed for nausea or vomiting. 02/05/16  Yes Lequita Asal, MD    Allergies as of 02/06/2016  . (No Known Allergies)    History reviewed. No pertinent family history.  Social History   Social History  . Marital status: Single   Spouse name: N/A  . Number of children: N/A  . Years of education: N/A   Occupational History  . Not on file.   Social History Main Topics  . Smoking status: Never Smoker  . Smokeless tobacco: Never Used  . Alcohol use No  . Drug use: No  . Sexual activity: Not on file   Other Topics Concern  . Not on file   Social History Narrative  . No narrative on file    Review of Systems: See HPI, otherwise negative ROS  Physical Exam: BP (!) 136/94   Pulse 74   Temp 97.9 F (36.6 C) (Tympanic)   Resp 20   Ht 5\' 5"  (1.651 m)   Wt 185 lb (83.9 kg)   SpO2 100%   BMI 30.79 kg/m  General:   Alert,  pleasant and cooperative in NAD Head:  Normocephalic and atraumatic. Neck:  Supple; no masses or thyromegaly. Lungs:  Clear throughout to auscultation.    Heart:  Regular rate and rhythm. Abdomen:  Soft, nontender and nondistended. Normal bowel sounds, without guarding, and without rebound.   Neurologic:  Alert and  oriented x4;  grossly normal neurologically.  Impression/Plan: Anthony Melendez is here for an endoscopy to be performed for gastric cancer  Risks, benefits, limitations, and alternatives regarding  endoscopy have been reviewed with the patient.  Questions have been answered.  All parties agreeable.   Lucilla Lame, MD  02/26/2016, 8:30 AM

## 2016-02-26 NOTE — Anesthesia Postprocedure Evaluation (Signed)
Anesthesia Post Note  Patient: Romelle Glanton  Procedure(s) Performed: Procedure(s) (LRB): ESOPHAGOGASTRODUODENOSCOPY (EGD) WITH PROPOFOL (N/A)  Patient location during evaluation: Endoscopy Anesthesia Type: General Level of consciousness: awake and alert and oriented Pain management: pain level controlled Vital Signs Assessment: post-procedure vital signs reviewed and stable Respiratory status: spontaneous breathing, nonlabored ventilation and respiratory function stable Cardiovascular status: blood pressure returned to baseline and stable Postop Assessment: no signs of nausea or vomiting Anesthetic complications: no     Last Vitals:  Vitals:   02/26/16 0846 02/26/16 0848  BP: (!) 91/52   Pulse: 85   Resp: 12   Temp:  36.6 C    Last Pain:  Vitals:   02/26/16 0848  TempSrc: Tympanic  PainSc: Asleep                 Gwynne Kemnitz

## 2016-02-26 NOTE — Anesthesia Preprocedure Evaluation (Signed)
Anesthesia Evaluation  Patient identified by MRN, date of birth, ID band Patient awake    Reviewed: Allergy & Precautions, NPO status , Patient's Chart, lab work & pertinent test results  History of Anesthesia Complications Negative for: history of anesthetic complications  Airway Mallampati: II  TM Distance: >3 FB Neck ROM: Full    Dental no notable dental hx.    Pulmonary neg pulmonary ROS, neg sleep apnea, neg COPD,    breath sounds clear to auscultation- rhonchi (-) wheezing      Cardiovascular Exercise Tolerance: Good (-) hypertension(-) CAD and (-) Past MI  Rhythm:Regular Rate:Normal - Systolic murmurs and - Diastolic murmurs    Neuro/Psych negative neurological ROS  negative psych ROS   GI/Hepatic Neg liver ROS, PUD, GERD  ,  Endo/Other  negative endocrine ROSneg diabetes  Renal/GU negative Renal ROS     Musculoskeletal negative musculoskeletal ROS (+)   Abdominal (+) + obese,   Peds  Hematology  (+) anemia ,   Anesthesia Other Findings Past Medical History: 08/2015: Gastric cancer ([place])     Comment: Folfox chemo tx's. No date: GERD (gastroesophageal reflux disease)   Reproductive/Obstetrics                             Anesthesia Physical Anesthesia Plan  ASA: III  Anesthesia Plan: General   Post-op Pain Management:    Induction: Intravenous  Airway Management Planned: Natural Airway  Additional Equipment:   Intra-op Plan:   Post-operative Plan:   Informed Consent: I have reviewed the patients History and Physical, chart, labs and discussed the procedure including the risks, benefits and alternatives for the proposed anesthesia with the patient or authorized representative who has indicated his/her understanding and acceptance.   Dental advisory given  Plan Discussed with: CRNA and Anesthesiologist  Anesthesia Plan Comments:         Anesthesia Quick  Evaluation

## 2016-02-27 LAB — SURGICAL PATHOLOGY

## 2016-02-28 ENCOUNTER — Encounter: Payer: Self-pay | Admitting: Gastroenterology

## 2016-03-02 NOTE — Progress Notes (Signed)
[facility] day:  03/05/16  Chief Complaint: Anthony Melendez is a 36 y.o. male with metastatic gastric cancer who is seen for assessment prior to cycle #1 5FU/LV chemotherapy.  HPI:  Patient returns to clinic today to initiate maintenance 5-FU therapy. He currently feels well and is asymptomatic. He has no neurologic complaints. He denies any recent fevers or illnesses. He is a good appetite and denies weight loss. He denies any chest pain or shortness of breath. He does not have any nausea, vomiting, constipation, or diarrhea. He is no urinary complaints. Patient feels at his baseline and offers no specific complaints today.    Past Medical History:  Diagnosis Date  . Gastric cancer ([place]) 08/2015   Folfox chemo tx's.  Marland Kitchen GERD (gastroesophageal reflux disease)     Past Surgical History:  Procedure Laterality Date  . ESOPHAGOGASTRODUODENOSCOPY (EGD) WITH PROPOFOL N/A 08/02/2015   Procedure: ESOPHAGOGASTRODUODENOSCOPY (EGD) WITH PROPOFOL;  Surgeon: Lucilla Lame, MD;  Location: [facility];  Service: Endoscopy;  Laterality: N/A;  . ESOPHAGOGASTRODUODENOSCOPY (EGD) WITH PROPOFOL N/A 02/26/2016   Procedure: ESOPHAGOGASTRODUODENOSCOPY (EGD) WITH PROPOFOL;  Surgeon: Lucilla Lame, MD;  Location: [facility];  Service: Endoscopy;  Laterality: N/A;  . NO PAST SURGERIES    . PERIPHERAL VASCULAR CATHETERIZATION N/A 08/13/2015   Procedure: Glori Luis Cath Insertion;  Surgeon: Algernon Huxley, MD;  Location: [facility];  Service: Cardiovascular;  Laterality: N/A;    No family history on file.  Social History:  reports that he has never smoked. He has never used smokeless tobacco. He reports that he does not drink alcohol or use drugs.  The patient is from [place].  He speaks [place].  He moves to the [place] in 2000.  He lives in [place] with his mother, father, brother and sister.  He is a Theme park manager.  The patient is accompanied by his his  mother, brother, and the interpreter today.  Allergies: No Known Allergies  Current Medications: Current Outpatient Prescriptions  Medication Sig Dispense Refill  . ferrous sulfate 325 (65 FE) MG EC tablet Take 1 tablet (325 mg total) by mouth daily with breakfast. 30 tablet 3  . HYDROcodone-acetaminophen (NORCO) 5-325 MG tablet Take 1 tablet by mouth every 6 (six) hours as needed for moderate pain. 20 tablet 0  . omeprazole (PRILOSEC) 20 MG capsule Take 1 capsule (20 mg total) by mouth 2 (two) times daily before a meal. 60 capsule 2  . potassium chloride (K-DUR) 10 MEQ tablet Take 1 tablet daily 30 tablet 0  . prochlorperazine (COMPAZINE) 10 MG tablet Take 1 tablet (10 mg total) by mouth every 6 (six) hours as needed for nausea or vomiting. 30 tablet 1   No current facility-administered medications for this visit.    Facility-Administered Medications Ordered in Other Visits  Medication Dose Route Frequency Provider Last Rate Last Dose  . heparin lock flush 100 unit/mL  500 Units Intravenous Once Lequita Asal, MD      . pegfilgrastim (NEULASTA) injection 6 mg  6 mg Subcutaneous Once Lequita Asal, MD      . sodium chloride 0.9 % injection 10 mL  10 mL Intravenous Once Lequita Asal, MD      . sodium chloride flush (NS) 0.9 % injection 10 mL  10 mL Intravenous PRN Lequita Asal, MD   10 mL at 08/28/15 0835  . sodium chloride flush (NS) 0.9 % injection 10 mL  10 mL Intracatheter PRN  Lequita Asal, MD   10 mL at 03/05/16 1138    Review of Systems:  GENERAL:  Feels good.  No fevers or sweats.  PERFORMANCE STATUS (ECOG):  0 HEENT:  No visual changes, runny nose, sore throat, mouth sores or tenderness. Lungs:  No shortness of breath or cough.  No hemoptysis. Cardiac:  No chest pain, palpitations, orthopnea, or PND. GI:   Brief abdominal pain yesterday.  Intermittent constipation.  No vomiting, diarrhea, melena or hematochezia. GU:  No urgency, frequency, dysuria, or  hematuria. Musculoskeletal:  No bone pain.  No muscle tenderness.  Extremities:  No pain or swelling. Skin:  No rashes or skin changes. Neuro:  No headache, numbness or weakness, balance or coordination issues. Endocrine:  No diabetes, thyroid issues, hot flashes or night sweats. Psych:  No mood changes, depression or anxiety. Pain:  No pain.  Review of systems:  All other systems reviewed and found to be negative.  Physical Exam: Blood pressure 132/88, pulse 67, temperature 98.7 F (37.1 C), temperature source Tympanic, resp. rate 18, weight 188 lb 15 oz (85.7 kg). GENERAL:  Well developed, well nourished, gentleman sitting comfortably in the exam room in no acute distress. MENTAL STATUS:  Alert and oriented to person, place and time. HEAD:  Wearing a blue cap.  Black hair.  Thin mustache.  Normocephalic, atraumatic, face symmetric, no Cushingoid features. EYES:  Brown eyes.  No conjunctivitis or scleral icterus. ENT:  Oropharynx clear without lesion.  Tongue normal. Mucous membranes moist.  RESPIRATORY:  Clear to auscultation without rales, wheezes or rhonchi. CARDIOVASCULAR:  Regular rate and rhythm without murmur, rub or gallop. ABDOMEN:  Soft, non-tender with active bowel sounds and no hepatosplenomegaly.  No masses. SKIN:  No rashes, ulcers or lesions. EXTREMITIES: No edema, no skin discoloration or tenderness.  No palpable cords. LYMPH NODES: No palpable cervical, supraclavicular, axillary or inguinal adenopathy  NEUROLOGICAL: Unremarkable. PSYCH:  Appropriate.  NEUROLOGICAL: Appropriate.   Appointment on 03/03/2016  Component Date Value Ref Range Status  . WBC 03/03/2016 4.8  3.8 - 10.6 K/uL Final  . RBC 03/03/2016 4.80  4.40 - 5.90 MIL/uL Final  . Hemoglobin 03/03/2016 15.0  13.0 - 18.0 g/dL Final  . HCT 03/03/2016 43.4  40.0 - 52.0 % Final  . MCV 03/03/2016 90.4  80.0 - 100.0 fL Final  . MCH 03/03/2016 31.3  26.0 - 34.0 pg Final  . MCHC 03/03/2016 34.6  32.0 - 36.0  g/dL Final  . RDW 03/03/2016 16.4* 11.5 - 14.5 % Final  . Platelets 03/03/2016 121* 150 - 440 K/uL Final  . Neutrophils Relative % 03/03/2016 67  % Final  . Neutro Abs 03/03/2016 3.2  1.4 - 6.5 K/uL Final  . Lymphocytes Relative 03/03/2016 21  % Final  . Lymphs Abs 03/03/2016 1.0  1.0 - 3.6 K/uL Final  . Monocytes Relative 03/03/2016 10  % Final  . Monocytes Absolute 03/03/2016 0.5  0.2 - 1.0 K/uL Final  . Eosinophils Relative 03/03/2016 1  % Final  . Eosinophils Absolute 03/03/2016 0.1  0 - 0.7 K/uL Final  . Basophils Relative 03/03/2016 1  % Final  . Basophils Absolute 03/03/2016 0.0  0 - 0.1 K/uL Final  . Sodium 03/03/2016 138  135 - 145 mmol/L Final  . Potassium 03/03/2016 3.5  3.5 - 5.1 mmol/L Final  . Chloride 03/03/2016 107  101 - 111 mmol/L Final  . CO2 03/03/2016 26  22 - 32 mmol/L Final  . Glucose, Bld 03/03/2016 182* 65 -  99 mg/dL Final  . BUN 03/03/2016 13  6 - 20 mg/dL Final  . Creatinine, Ser 03/03/2016 0.71  0.61 - 1.24 mg/dL Final  . Calcium 03/03/2016 8.9  8.9 - 10.3 mg/dL Final  . Total Protein 03/03/2016 7.8  6.5 - 8.1 g/dL Final  . Albumin 03/03/2016 4.2  3.5 - 5.0 g/dL Final  . AST 03/03/2016 41  15 - 41 U/L Final  . ALT 03/03/2016 32  17 - 63 U/L Final  . Alkaline Phosphatase 03/03/2016 97  38 - 126 U/L Final  . Total Bilirubin 03/03/2016 0.7  0.3 - 1.2 mg/dL Final  . GFR calc non Af Amer 03/03/2016 >60  >60 mL/min Final  . GFR calc Af Amer 03/03/2016 >60  >60 mL/min Final   Comment: (NOTE) The eGFR has been calculated using the CKD EPI equation. This calculation has not been validated in all clinical situations. eGFR's persistently <60 mL/min signify possible Chronic Kidney Disease.   . Anion gap 03/03/2016 5  5 - 15 Final  . Magnesium 03/03/2016 1.9  1.7 - 2.4 mg/dL Final    Assessment:  Anthony Melendez is a 36 y.o. male with metastatic gastric cancer.  He presented with a 4-6 week history of progressive epigastric pain.  EGD on 08/02/2015 revealed a  large, ulcerated, non-circumferential mass with oozing and stigmata of recent bleeding in the entire examined stomach.  The mass involved the fundus down to the antrum.  Biopsies revealed poorly differentiated adenocarcinoma.  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  Her2/neu testing was negative.  Microsatellite testing was negative.  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  Chest, abdomen and pelvic CT scan on 07/29/2015 revealed a 4 cm thickened and irregular gastric wall compatible with an infiltrative malignancy. There was extensive involvement of the upper mesentery and omentum compatible with peritoneal carcinomatosis. There was a small amount of ascites.   Head CT on 08/08/2015 revealed no evidence of metastatic disease.  He completed treatment for H pylori  (began 08/09/2015).  Stool was negative for H pylori on 11/01/2015.  He has iron deficiency anemia likely gastric oozing and modest diet.  RBC are microcytic.  Labs on 08/21/2015 revealed a ferritin was 44 with an iron saturation 4% and a TIBC of 395.  Ferritin was 116 on 09/18/2015.  He received 12 cycles of FOLFOX chemotherapy (08/14/2015 - 02/05/2016).  Chemotherapy was held on 10/23/2015 secondary to thrombocytopenia (87,000).  Platelet count was 90,000 prior to cycle #11.  He has tolerated his chemotherapy well without diarrhea, mouth sores, or neuropathy.  Abdomen and pelvic CT scan on 10/05/2015 revealed a positive response to therapy with decreased size of the primary mass along the lesser curvature of the stomach, decrease extension of the mass into the gastrohepatic ligament, decreasing evidence of peritoneal carcinomatosis involving predominantly the omentum, and resolution of previously noted malignant ascites. There was a potential ulcer along the  greater curvature of the mid body of the stomach.  Abdomen and pelvic CT scan on 12/24/2015 revealed further improvement in primary gastric malignancy and adjacent lymphadenopathy. There was no residual peritoneal carcinomatosis identified.  Symptomatically, he is doing well.  Exam is normal.    Plan: 1.  Labs today:  CBC with diff, CMP, Mg. 2.  Proceed with cycle #1 5FU and LV. 3.  EGD on 02/26/2016 revealed decreased size of mass, but it was  otherwise normal. Biopsy was negative for malignancy. 4.  RTC in 2 weeks for repeat laboratory work, further evaluation, and consideration of cycle 2 of maintenance 5FU/LV.   Lloyd Huger, MD  03/05/2016, 1:15 PM

## 2016-03-03 ENCOUNTER — Inpatient Hospital Stay: Payer: Self-pay

## 2016-03-03 ENCOUNTER — Inpatient Hospital Stay (HOSPITAL_BASED_OUTPATIENT_CLINIC_OR_DEPARTMENT_OTHER): Payer: 59 | Admitting: Oncology

## 2016-03-03 VITALS — BP 132/88 | HR 67 | Temp 98.7°F | Resp 18 | Wt 188.9 lb

## 2016-03-03 DIAGNOSIS — Z7689 Persons encountering health services in other specified circumstances: Secondary | ICD-10-CM

## 2016-03-03 DIAGNOSIS — C168 Malignant neoplasm of overlapping sites of stomach: Secondary | ICD-10-CM

## 2016-03-03 DIAGNOSIS — K2970 Gastritis, unspecified, without bleeding: Secondary | ICD-10-CM

## 2016-03-03 DIAGNOSIS — C786 Secondary malignant neoplasm of retroperitoneum and peritoneum: Secondary | ICD-10-CM

## 2016-03-03 DIAGNOSIS — C162 Malignant neoplasm of body of stomach: Secondary | ICD-10-CM

## 2016-03-03 DIAGNOSIS — Z8619 Personal history of other infectious and parasitic diseases: Secondary | ICD-10-CM

## 2016-03-03 DIAGNOSIS — D509 Iron deficiency anemia, unspecified: Secondary | ICD-10-CM | POA: Diagnosis not present

## 2016-03-03 DIAGNOSIS — Z79899 Other long term (current) drug therapy: Secondary | ICD-10-CM

## 2016-03-03 DIAGNOSIS — T451X5A Adverse effect of antineoplastic and immunosuppressive drugs, initial encounter: Secondary | ICD-10-CM

## 2016-03-03 DIAGNOSIS — Z1509 Genetic susceptibility to other malignant neoplasm: Secondary | ICD-10-CM

## 2016-03-03 DIAGNOSIS — K219 Gastro-esophageal reflux disease without esophagitis: Secondary | ICD-10-CM

## 2016-03-03 DIAGNOSIS — G620 Drug-induced polyneuropathy: Secondary | ICD-10-CM | POA: Insufficient documentation

## 2016-03-03 DIAGNOSIS — C801 Malignant (primary) neoplasm, unspecified: Secondary | ICD-10-CM

## 2016-03-03 DIAGNOSIS — D696 Thrombocytopenia, unspecified: Secondary | ICD-10-CM

## 2016-03-03 DIAGNOSIS — G629 Polyneuropathy, unspecified: Secondary | ICD-10-CM

## 2016-03-03 DIAGNOSIS — C169 Malignant neoplasm of stomach, unspecified: Secondary | ICD-10-CM

## 2016-03-03 LAB — COMPREHENSIVE METABOLIC PANEL
ALT: 32 U/L (ref 17–63)
AST: 41 U/L (ref 15–41)
Albumin: 4.2 g/dL (ref 3.5–5.0)
Alkaline Phosphatase: 97 U/L (ref 38–126)
Anion gap: 5 (ref 5–15)
BUN: 13 mg/dL (ref 6–20)
CO2: 26 mmol/L (ref 22–32)
Calcium: 8.9 mg/dL (ref 8.9–10.3)
Chloride: 107 mmol/L (ref 101–111)
Creatinine, Ser: 0.71 mg/dL (ref 0.61–1.24)
GFR calc Af Amer: >60 mL/min (ref >60)
GFR calc non Af Amer: >60 mL/min (ref >60)
Glucose, Bld: 182 mg/dL — ABNORMAL HIGH (ref 65–99)
Potassium: 3.5 mmol/L (ref 3.5–5.1)
Sodium: 138 mmol/L (ref 135–145)
Total Bilirubin: 0.7 mg/dL (ref 0.3–1.2)
Total Protein: 7.8 g/dL (ref 6.5–8.1)

## 2016-03-03 LAB — CBC WITH DIFFERENTIAL/PLATELET
Basophils Absolute: 0 10*3/uL (ref 0–0.1)
Basophils Relative: 1 %
Eosinophils Absolute: 0.1 10*3/uL (ref 0–0.7)
Eosinophils Relative: 1 %
HCT: 43.4 % (ref 40.0–52.0)
Hemoglobin: 15 g/dL (ref 13.0–18.0)
Lymphocytes Relative: 21 %
Lymphs Abs: 1 10*3/uL (ref 1.0–3.6)
MCH: 31.3 pg (ref 26.0–34.0)
MCHC: 34.6 g/dL (ref 32.0–36.0)
MCV: 90.4 fL (ref 80.0–100.0)
Monocytes Absolute: 0.5 10*3/uL (ref 0.2–1.0)
Monocytes Relative: 10 %
Neutro Abs: 3.2 10*3/uL (ref 1.4–6.5)
Neutrophils Relative %: 67 %
Platelets: 121 10*3/uL — ABNORMAL LOW (ref 150–440)
RBC: 4.8 MIL/uL (ref 4.40–5.90)
RDW: 16.4 % — ABNORMAL HIGH (ref 11.5–14.5)
WBC: 4.8 10*3/uL (ref 3.8–10.6)

## 2016-03-03 LAB — MAGNESIUM: Magnesium: 1.9 mg/dL (ref 1.7–2.4)

## 2016-03-03 MED ORDER — SODIUM CHLORIDE 0.9% FLUSH
10.0000 mL | INTRAVENOUS | Status: DC | PRN
  Administered 2016-03-03: 10 mL
  Filled 2016-03-03: qty 10

## 2016-03-03 MED ORDER — FLUOROURACIL CHEMO INJECTION 2.5 GM/50ML
400.0000 mg/m2 | Freq: Once | INTRAVENOUS | Status: AC
  Administered 2016-03-03: 800 mg via INTRAVENOUS
  Filled 2016-03-03: qty 16

## 2016-03-03 MED ORDER — LEUCOVORIN CALCIUM INJECTION 100 MG
20.0000 mg/m2 | Freq: Once | INTRAMUSCULAR | Status: AC
  Administered 2016-03-03: 40 mg via INTRAVENOUS
  Filled 2016-03-03: qty 2

## 2016-03-03 MED ORDER — ONDANSETRON HCL 4 MG PO TABS
8.0000 mg | ORAL_TABLET | Freq: Once | ORAL | Status: AC
  Administered 2016-03-03: 8 mg via ORAL
  Filled 2016-03-03: qty 2

## 2016-03-03 MED ORDER — DEXTROSE 5 % IV SOLN: 400.0000 mg/m2 | Freq: Once | INTRAVENOUS | Status: DC

## 2016-03-03 MED ORDER — SODIUM CHLORIDE 0.9 % IV SOLN
Freq: Once | INTRAVENOUS | Status: AC
  Administered 2016-03-03: 10:00:00 via INTRAVENOUS
  Filled 2016-03-03: qty 1000

## 2016-03-03 MED ORDER — SODIUM CHLORIDE 0.9 % IV SOLN
2400.0000 mg/m2 | INTRAVENOUS | Status: DC
  Administered 2016-03-03: 4750 mg via INTRAVENOUS
  Filled 2016-03-03: qty 95

## 2016-03-03 NOTE — Progress Notes (Signed)
Patient states for past few days he feels full.  States he feels hungry but full at the same time.  Noticed that when he feels like this he also has pain in his back that has radiated from that area. Also at this time he feels SOB.  States pain is more in upper abdomen.  A few days ago experienced pain in right upper quadrant.

## 2016-03-05 ENCOUNTER — Inpatient Hospital Stay: Payer: Self-pay

## 2016-03-05 VITALS — BP 124/80 | HR 73 | Temp 96.3°F | Resp 18

## 2016-03-05 DIAGNOSIS — C786 Secondary malignant neoplasm of retroperitoneum and peritoneum: Secondary | ICD-10-CM

## 2016-03-05 DIAGNOSIS — C162 Malignant neoplasm of body of stomach: Secondary | ICD-10-CM

## 2016-03-05 DIAGNOSIS — C801 Malignant (primary) neoplasm, unspecified: Secondary | ICD-10-CM

## 2016-03-05 MED ORDER — SODIUM CHLORIDE 0.9% FLUSH
10.0000 mL | INTRAVENOUS | Status: DC | PRN
  Administered 2016-03-05: 10 mL
  Filled 2016-03-05: qty 10

## 2016-03-05 MED ORDER — HEPARIN SOD (PORK) LOCK FLUSH 100 UNIT/ML IV SOLN
500.0000 [IU] | Freq: Once | INTRAVENOUS | Status: AC | PRN
  Administered 2016-03-05: 500 [IU]
  Filled 2016-03-05: qty 5

## 2016-03-16 ENCOUNTER — Other Ambulatory Visit: Payer: Self-pay | Admitting: Hematology and Oncology

## 2016-03-16 NOTE — Progress Notes (Signed)
[facility] day:  03/17/16  Chief Complaint: Anthony Melendez is a 36 y.o. male with metastatic gastric cancer who is seen for assessment prior to cycle #2 5FU/LV chemotherapy.  HPI:  The patient was last seen in the medical oncology clinic on 03/03/2016.  At that time, he had intermittent epigastric pain.  Chemotherapy was postponed secondary to a platelet count of 90,000.  EGD on 02/26/2016 revealed one non-bleeding gastric ulcer.   The mass appeared smaller.  Biopsy revealed moderate chronic gastritis and edema, negative for H pylori, dysplasia and malignancy.  He saw Dr. Grayland Ormond in my absence on 03/03/2016.  Platelet count was 121,000.  He received cycle #1 maintenance 5FU/LV.  He denied any complaint.  Symptomatically, he is feeling "good". 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 He has intermittent epigastric, RUQ and LUQ pain (3/10) and fullness. He states it is intermittent and he has not had to take any medications to help with these symptoms. He has had this pain for several weeks. He denies any appetite concerns, nausea, vomiting or diarrhea.    Past Medical History:  Diagnosis Date  . Gastric cancer ([place]) 08/2015   Folfox chemo tx's.  Marland Kitchen GERD (gastroesophageal reflux disease)     Past Surgical History:  Procedure Laterality Date  . ESOPHAGOGASTRODUODENOSCOPY (EGD) WITH PROPOFOL N/A 08/02/2015   Procedure: ESOPHAGOGASTRODUODENOSCOPY (EGD) WITH PROPOFOL;  Surgeon: Lucilla Lame, MD;  Location: [facility];  Service: Endoscopy;  Laterality: N/A;  . ESOPHAGOGASTRODUODENOSCOPY (EGD) WITH PROPOFOL N/A 02/26/2016   Procedure: ESOPHAGOGASTRODUODENOSCOPY (EGD) WITH PROPOFOL;  Surgeon: Lucilla Lame, MD;  Location: [facility];  Service: Endoscopy;  Laterality: N/A;  . NO PAST SURGERIES    . PERIPHERAL VASCULAR CATHETERIZATION N/A 08/13/2015   Procedure: Glori Luis Cath Insertion;  Surgeon: Algernon Huxley, MD;  Location: [facility];  Service: Cardiovascular;  Laterality: N/A;    No family history on file.  Social History:  reports that he has never smoked. He has never used smokeless tobacco. He reports that he does not drink alcohol or use drugs.  The patient is from [place].  He speaks [place].  He moves to the [place] in 2000.  He lives in [place] with his mother, father, brother and sister.  He is a Theme park manager.  The patient is accompanied by his mother and sister. An interpreter is present today.   Allergies: No Known Allergies  Current Medications: Current Outpatient Prescriptions  Medication Sig Dispense Refill  . ferrous sulfate 325 (65 FE) MG EC tablet Take 1 tablet (325 mg total) by mouth daily with breakfast. 30 tablet 3  . HYDROcodone-acetaminophen (NORCO) 5-325 MG tablet Take 1 tablet by mouth every 6 (six) hours as needed for moderate pain. 20 tablet 0  . omeprazole (PRILOSEC) 20 MG capsule Take 1 capsule (20 mg total) by mouth 2 (two) times daily before a meal. 60 capsule 2  . potassium chloride (K-DUR) 10 MEQ tablet Take 1 tablet daily 30 tablet 0  . prochlorperazine (COMPAZINE) 10 MG tablet Take 1 tablet (10 mg total) by mouth every 6 (six) hours as needed for nausea or vomiting. 30 tablet 1   No current facility-administered medications for this visit.    Facility-Administered Medications Ordered in Other Visits  Medication Dose Route Frequency Provider Last Rate Last Dose  . heparin lock flush 100 unit/mL  500 Units Intravenous Once Lequita Asal, MD      . pegfilgrastim (NEULASTA) injection 6  mg  6 mg Subcutaneous Once Lequita Asal, MD      . sodium chloride 0.9 % injection 10 mL  10 mL Intravenous Once Lequita Asal, MD      . sodium chloride flush (NS) 0.9 % injection 10 mL  10 mL Intravenous PRN Lequita Asal, MD   10 mL at 08/28/15 0835  . sodium chloride flush (NS) 0.9 % injection 10 mL  10 mL Intravenous PRN Lequita Asal, MD   10 mL at 03/17/16 0940     Review of Systems:  GENERAL:  Feels good.  No fevers or sweats.  Weight up 4 pounds. PERFORMANCE STATUS (ECOG):  1 HEENT:  No visual changes, runny nose, sore throat, mouth sores or tenderness. Lungs:  No shortness of breath or cough.  No hemoptysis. Cardiac:  No chest pain, palpitations, orthopnea, or PND. GI:   Abdominal pain (see HPI).  No nausea, vomiting, diarrhea, constipation, melena or hematochezia. GU:  No urgency, frequency, dysuria, or hematuria. Musculoskeletal:  No bone pain.  No muscle tenderness. . Extremities:  No pain or swelling. Skin:  No rashes or skin changes. Neuro:  No headache, numbness or weakness, balance or coordination issues. Endocrine:  No diabetes, thyroid issues, hot flashes or night sweats. Psych:  No mood changes, depression or anxiety. Pain:  Upper abdominal pain (3 out of 10). Review of systems:  All other systems reviewed and found to be negative.  Physical Exam: Blood pressure (!) 151/95, pulse 84, temperature 98.2 F (36.8 C), temperature source Oral, weight 192 lb 10.9 oz (87.4 kg). GENERAL:  Well developed, well nourished, gentleman sitting comfortably in the exam room in no acute distress. MENTAL STATUS:  Alert and oriented to person, place and time. HEAD:  Wearing a blue cap.  Black hair.  Thin mustache.  Normocephalic, atraumatic, face symmetric, no Cushingoid features. EYES:  Brown eyes.  No conjunctivitis or scleral icterus. ENT:  Oropharynx clear without lesion.  Tongue normal. Mucous membranes moist.  RESPIRATORY:  Clear to auscultation without rales, wheezes or rhonchi. CARDIOVASCULAR:  Regular rate and rhythm without murmur, rub or gallop. ABDOMEN:  Soft, tender in the epigastric and LUQ region (increased from prior exams.  No guarding or rebound tenderness.  Active bowel sounds and no hepatosplenomegaly.  No masses. SKIN:  No rashes, ulcers or lesions. EXTREMITIES: No edema, no skin discoloration or tenderness.  No palpable  cords. LYMPH NODES: No palpable cervical, supraclavicular, axillary or inguinal adenopathy  NEUROLOGICAL: Unremarkable. PSYCH:  Appropriate.  NEUROLOGICAL: Appropriate.   Infusion on 03/17/2016  Component Date Value Ref Range Status  . WBC 03/17/2016 3.4* 3.8 - 10.6 K/uL Final  . RBC 03/17/2016 4.58  4.40 - 5.90 MIL/uL Final  . Hemoglobin 03/17/2016 14.5  13.0 - 18.0 g/dL Final  . HCT 03/17/2016 40.9  40.0 - 52.0 % Final  . MCV 03/17/2016 89.3  80.0 - 100.0 fL Final  . MCH 03/17/2016 31.6  26.0 - 34.0 pg Final  . MCHC 03/17/2016 35.4  32.0 - 36.0 g/dL Final  . RDW 03/17/2016 15.2* 11.5 - 14.5 % Final  . Platelets 03/17/2016 102* 150 - 440 K/uL Final  . Neutrophils Relative % 03/17/2016 57  % Final  . Neutro Abs 03/17/2016 2.0  1.4 - 6.5 K/uL Final  . Lymphocytes Relative 03/17/2016 30  % Final  . Lymphs Abs 03/17/2016 1.0  1.0 - 3.6 K/uL Final  . Monocytes Relative 03/17/2016 9  % Final  . Monocytes Absolute 03/17/2016 0.3  0.2 - 1.0 K/uL Final  . Eosinophils Relative 03/17/2016 3  % Final  . Eosinophils Absolute 03/17/2016 0.1  0 - 0.7 K/uL Final  . Basophils Relative 03/17/2016 1  % Final  . Basophils Absolute 03/17/2016 0.0  0 - 0.1 K/uL Final  . Sodium 03/17/2016 137  135 - 145 mmol/L Final  . Potassium 03/17/2016 3.4* 3.5 - 5.1 mmol/L Final  . Chloride 03/17/2016 105  101 - 111 mmol/L Final  . CO2 03/17/2016 27  22 - 32 mmol/L Final  . Glucose, Bld 03/17/2016 179* 65 - 99 mg/dL Final  . BUN 03/17/2016 11  6 - 20 mg/dL Final  . Creatinine, Ser 03/17/2016 0.69  0.61 - 1.24 mg/dL Final  . Calcium 03/17/2016 8.8* 8.9 - 10.3 mg/dL Final  . Total Protein 03/17/2016 7.5  6.5 - 8.1 g/dL Final  . Albumin 03/17/2016 4.0  3.5 - 5.0 g/dL Final  . AST 03/17/2016 40  15 - 41 U/L Final  . ALT 03/17/2016 29  17 - 63 U/L Final  . Alkaline Phosphatase 03/17/2016 76  38 - 126 U/L Final  . Total Bilirubin 03/17/2016 0.8  0.3 - 1.2 mg/dL Final  . GFR calc non Af Amer 03/17/2016 >60  >60  mL/min Final  . GFR calc Af Amer 03/17/2016 >60  >60 mL/min Final   Comment: (NOTE) The eGFR has been calculated using the CKD EPI equation. This calculation has not been validated in all clinical situations. eGFR's persistently <60 mL/min signify possible Chronic Kidney Disease.   . Anion gap 03/17/2016 5  5 - 15 Final    Assessment:  Anthony Melendez is a 36 y.o. male with metastatic gastric cancer.  He presented with a 4-6 week history of progressive epigastric pain.  EGD on 08/02/2015 revealed a large, ulcerated, non-circumferential mass with oozing and stigmata of recent bleeding in the entire examined stomach.  The mass involved the fundus down to the antrum.  Biopsies revealed poorly differentiated adenocarcinoma.  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  Her2/neu testing was negative.  Microsatellite testing was negative.  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  CEA was 0.3 on 07/30/2015.  Chest, abdomen and pelvic CT scan on 07/29/2015 revealed a 4 cm thickened and irregular gastric wall compatible with an infiltrative malignancy. There was extensive involvement of the upper mesentery and omentum compatible with peritoneal carcinomatosis. There was a small amount of ascites.   Head CT on 08/08/2015 revealed no evidence of metastatic disease.  He completed treatment for H pylori  (began 08/09/2015).  Stool was negative for H pylori on 11/01/2015.  He has iron deficiency anemia likely gastric oozing and modest diet.  RBC are microcytic.  Labs on 08/21/2015 revealed a ferritin was 44 with an iron saturation 4% and a TIBC of 395.  Ferritin was 116 on 09/18/2015.  He received 12 cycles of FOLFOX chemotherapy (08/14/2015 - 02/05/2016).  Chemotherapy was held on 10/23/2015 secondary to thrombocytopenia (87,000).   Platelet count was 90,000 prior to cycle #11.  He has tolerated his chemotherapy well without diarrhea, mouth sores, or neuropathy.  Abdomen and pelvic CT scan on 10/05/2015 revealed a positive response to therapy with decreased size of the primary mass along the lesser curvature of the stomach, decrease extension of the mass into the gastrohepatic ligament, decreasing evidence of peritoneal carcinomatosis involving predominantly the omentum, and resolution  of previously noted malignant ascites. There was a potential ulcer along the greater curvature of the mid body of the stomach.  Abdomen and pelvic CT scan on 12/24/2015 revealed further improvement in primary gastric malignancy and adjacent lymphadenopathy. There was no residual peritoneal carcinomatosis identified.  EGD on 02/26/2016 revealed one non-bleeding gastric ulcer.   The mass appeared smaller.  Biopsy revealed moderate chronic gastritis and edema, negative for H pylori, dysplasia and malignancy.  He is s/p cycle 1 of maintenance 5FU/LV (03/03/2016).  Symptomatically, he has intermittent upper abdominal pain and pressure. Exam reveals increased tenderness on palpation.  Platelet count is 102,000.   Plan: 1.  Labs today:  CBC with diff, CMP. 2.  Hypokalemia: K+ 3.4 today. Discussed foods rich in potassium.  3.  Hold treatment today due to worsening abdominal pain.  4.  CT of abdomen w/contrast this week for evaluation of worsening abdominal pain.  5.  Continue omeprazole.  6.  RTC in 1 week for MD assessment, labs (CBC with diff, CMP, Mg), CT scan review and re-consideration of cycle #2 maintenance 5FU/LV.  The patient was seen and examined.  Discuss concern for worsening physical exam (pain on palpation of abdomen).  Discuss temporarily holding therapy and getting a CT scan.  The patient felt comfortable with this plan.  The assessment and plan was discussed with the patient.  A few questions were asked and answered.    Faythe Casa, NP  Lequita Asal, MD 03/17/2016, 12:26 PM

## 2016-03-17 ENCOUNTER — Inpatient Hospital Stay: Payer: Self-pay | Attending: Hematology and Oncology

## 2016-03-17 ENCOUNTER — Inpatient Hospital Stay: Payer: Self-pay

## 2016-03-17 ENCOUNTER — Inpatient Hospital Stay (HOSPITAL_BASED_OUTPATIENT_CLINIC_OR_DEPARTMENT_OTHER): Payer: Self-pay | Admitting: Hematology and Oncology

## 2016-03-17 VITALS — BP 151/95 | HR 84 | Temp 98.2°F | Wt 192.7 lb

## 2016-03-17 DIAGNOSIS — D509 Iron deficiency anemia, unspecified: Secondary | ICD-10-CM | POA: Insufficient documentation

## 2016-03-17 DIAGNOSIS — Z79899 Other long term (current) drug therapy: Secondary | ICD-10-CM | POA: Insufficient documentation

## 2016-03-17 DIAGNOSIS — R188 Other ascites: Secondary | ICD-10-CM

## 2016-03-17 DIAGNOSIS — R1012 Left upper quadrant pain: Secondary | ICD-10-CM | POA: Insufficient documentation

## 2016-03-17 DIAGNOSIS — R109 Unspecified abdominal pain: Secondary | ICD-10-CM | POA: Insufficient documentation

## 2016-03-17 DIAGNOSIS — K219 Gastro-esophageal reflux disease without esophagitis: Secondary | ICD-10-CM | POA: Insufficient documentation

## 2016-03-17 DIAGNOSIS — C169 Malignant neoplasm of stomach, unspecified: Secondary | ICD-10-CM

## 2016-03-17 DIAGNOSIS — R1900 Intra-abdominal and pelvic swelling, mass and lump, unspecified site: Secondary | ICD-10-CM

## 2016-03-17 DIAGNOSIS — E876 Hypokalemia: Secondary | ICD-10-CM

## 2016-03-17 DIAGNOSIS — C162 Malignant neoplasm of body of stomach: Secondary | ICD-10-CM | POA: Insufficient documentation

## 2016-03-17 DIAGNOSIS — C801 Malignant (primary) neoplasm, unspecified: Secondary | ICD-10-CM

## 2016-03-17 DIAGNOSIS — L299 Pruritus, unspecified: Secondary | ICD-10-CM | POA: Insufficient documentation

## 2016-03-17 DIAGNOSIS — Z1509 Genetic susceptibility to other malignant neoplasm: Secondary | ICD-10-CM

## 2016-03-17 DIAGNOSIS — C786 Secondary malignant neoplasm of retroperitoneum and peritoneum: Secondary | ICD-10-CM

## 2016-03-17 DIAGNOSIS — K2950 Unspecified chronic gastritis without bleeding: Secondary | ICD-10-CM | POA: Insufficient documentation

## 2016-03-17 DIAGNOSIS — Z5111 Encounter for antineoplastic chemotherapy: Secondary | ICD-10-CM | POA: Insufficient documentation

## 2016-03-17 DIAGNOSIS — C168 Malignant neoplasm of overlapping sites of stomach: Secondary | ICD-10-CM

## 2016-03-17 LAB — COMPREHENSIVE METABOLIC PANEL
ALT: 29 U/L (ref 17–63)
AST: 40 U/L (ref 15–41)
Albumin: 4 g/dL (ref 3.5–5.0)
Alkaline Phosphatase: 76 U/L (ref 38–126)
Anion gap: 5 (ref 5–15)
BUN: 11 mg/dL (ref 6–20)
CO2: 27 mmol/L (ref 22–32)
Calcium: 8.8 mg/dL — ABNORMAL LOW (ref 8.9–10.3)
Chloride: 105 mmol/L (ref 101–111)
Creatinine, Ser: 0.69 mg/dL (ref 0.61–1.24)
GFR calc Af Amer: >60 mL/min (ref >60)
GFR calc non Af Amer: >60 mL/min (ref >60)
Glucose, Bld: 179 mg/dL — ABNORMAL HIGH (ref 65–99)
Potassium: 3.4 mmol/L — ABNORMAL LOW (ref 3.5–5.1)
Sodium: 137 mmol/L (ref 135–145)
Total Bilirubin: 0.8 mg/dL (ref 0.3–1.2)
Total Protein: 7.5 g/dL (ref 6.5–8.1)

## 2016-03-17 LAB — CBC WITH DIFFERENTIAL/PLATELET
Basophils Absolute: 0 10*3/uL (ref 0–0.1)
Basophils Relative: 1 %
Eosinophils Absolute: 0.1 10*3/uL (ref 0–0.7)
Eosinophils Relative: 3 %
HCT: 40.9 % (ref 40.0–52.0)
Hemoglobin: 14.5 g/dL (ref 13.0–18.0)
Lymphocytes Relative: 30 %
Lymphs Abs: 1 10*3/uL (ref 1.0–3.6)
MCH: 31.6 pg (ref 26.0–34.0)
MCHC: 35.4 g/dL (ref 32.0–36.0)
MCV: 89.3 fL (ref 80.0–100.0)
Monocytes Absolute: 0.3 10*3/uL (ref 0.2–1.0)
Monocytes Relative: 9 %
Neutro Abs: 2 10*3/uL (ref 1.4–6.5)
Neutrophils Relative %: 57 %
Platelets: 102 10*3/uL — ABNORMAL LOW (ref 150–440)
RBC: 4.58 MIL/uL (ref 4.40–5.90)
RDW: 15.2 % — ABNORMAL HIGH (ref 11.5–14.5)
WBC: 3.4 10*3/uL — ABNORMAL LOW (ref 3.8–10.6)

## 2016-03-17 MED ORDER — SODIUM CHLORIDE 0.9% FLUSH
10.0000 mL | INTRAVENOUS | Status: DC | PRN
  Administered 2016-03-17: 10 mL via INTRAVENOUS
  Filled 2016-03-17: qty 10

## 2016-03-17 MED ORDER — HEPARIN SOD (PORK) LOCK FLUSH 100 UNIT/ML IV SOLN
500.0000 [IU] | Freq: Once | INTRAVENOUS | Status: AC
  Administered 2016-03-17: 500 [IU] via INTRAVENOUS
  Filled 2016-03-17: qty 5

## 2016-03-17 NOTE — Progress Notes (Signed)
Patient reports feeling full after eating and then having back pain, pt reports pain 3/10 today in mid upper abdomen, no n/v/d noted, denies mouth sores.

## 2016-03-21 ENCOUNTER — Ambulatory Visit
Admission: RE | Admit: 2016-03-21 | Discharge: 2016-03-21 | Disposition: A | Discharge status: Home / Self Care | Payer: Self-pay | Source: Ambulatory Visit | Attending: Oncology | Admitting: Oncology

## 2016-03-21 DIAGNOSIS — C801 Malignant (primary) neoplasm, unspecified: Secondary | ICD-10-CM | POA: Insufficient documentation

## 2016-03-21 DIAGNOSIS — Z5111 Encounter for antineoplastic chemotherapy: Secondary | ICD-10-CM

## 2016-03-21 DIAGNOSIS — R188 Other ascites: Secondary | ICD-10-CM | POA: Insufficient documentation

## 2016-03-21 DIAGNOSIS — C162 Malignant neoplasm of body of stomach: Secondary | ICD-10-CM | POA: Insufficient documentation

## 2016-03-21 DIAGNOSIS — K760 Fatty (change of) liver, not elsewhere classified: Secondary | ICD-10-CM | POA: Insufficient documentation

## 2016-03-21 DIAGNOSIS — R161 Splenomegaly, not elsewhere classified: Secondary | ICD-10-CM | POA: Insufficient documentation

## 2016-03-21 DIAGNOSIS — C786 Secondary malignant neoplasm of retroperitoneum and peritoneum: Secondary | ICD-10-CM | POA: Insufficient documentation

## 2016-03-21 MED ORDER — IOPAMIDOL (ISOVUE-300) INJECTION 61%
100.0000 mL | Freq: Once | INTRAVENOUS | Status: AC | PRN
  Administered 2016-03-21: 100 mL via INTRAVENOUS

## 2016-03-25 ENCOUNTER — Inpatient Hospital Stay: Payer: Self-pay

## 2016-03-25 ENCOUNTER — Telehealth: Payer: Self-pay | Admitting: *Deleted

## 2016-03-25 ENCOUNTER — Encounter: Payer: Self-pay | Admitting: Hematology and Oncology

## 2016-03-25 ENCOUNTER — Inpatient Hospital Stay (HOSPITAL_BASED_OUTPATIENT_CLINIC_OR_DEPARTMENT_OTHER): Payer: Self-pay | Admitting: Hematology and Oncology

## 2016-03-25 VITALS — BP 117/78 | HR 58 | Temp 97.7°F | Resp 18 | Wt 195.1 lb

## 2016-03-25 DIAGNOSIS — Z79899 Other long term (current) drug therapy: Secondary | ICD-10-CM

## 2016-03-25 DIAGNOSIS — R188 Other ascites: Secondary | ICD-10-CM

## 2016-03-25 DIAGNOSIS — C162 Malignant neoplasm of body of stomach: Secondary | ICD-10-CM

## 2016-03-25 DIAGNOSIS — Z5111 Encounter for antineoplastic chemotherapy: Secondary | ICD-10-CM

## 2016-03-25 DIAGNOSIS — C801 Malignant (primary) neoplasm, unspecified: Secondary | ICD-10-CM

## 2016-03-25 DIAGNOSIS — R1900 Intra-abdominal and pelvic swelling, mass and lump, unspecified site: Secondary | ICD-10-CM

## 2016-03-25 DIAGNOSIS — L299 Pruritus, unspecified: Secondary | ICD-10-CM

## 2016-03-25 DIAGNOSIS — C786 Secondary malignant neoplasm of retroperitoneum and peritoneum: Secondary | ICD-10-CM

## 2016-03-25 DIAGNOSIS — R109 Unspecified abdominal pain: Secondary | ICD-10-CM

## 2016-03-25 DIAGNOSIS — K219 Gastro-esophageal reflux disease without esophagitis: Secondary | ICD-10-CM

## 2016-03-25 LAB — CBC WITH DIFFERENTIAL/PLATELET
Basophils Absolute: 0 10*3/uL (ref 0–0.1)
Basophils Relative: 1 %
Eosinophils Absolute: 0.1 10*3/uL (ref 0–0.7)
Eosinophils Relative: 3 %
HCT: 43.3 % (ref 40.0–52.0)
Hemoglobin: 15.1 g/dL (ref 13.0–18.0)
Lymphocytes Relative: 25 %
Lymphs Abs: 1 10*3/uL (ref 1.0–3.6)
MCH: 31.1 pg (ref 26.0–34.0)
MCHC: 34.9 g/dL (ref 32.0–36.0)
MCV: 89 fL (ref 80.0–100.0)
Monocytes Absolute: 0.4 10*3/uL (ref 0.2–1.0)
Monocytes Relative: 11 %
Neutro Abs: 2.3 10*3/uL (ref 1.4–6.5)
Neutrophils Relative %: 60 %
Platelets: 107 10*3/uL — ABNORMAL LOW (ref 150–440)
RBC: 4.86 MIL/uL (ref 4.40–5.90)
RDW: 14.3 % (ref 11.5–14.5)
WBC: 3.8 10*3/uL (ref 3.8–10.6)

## 2016-03-25 LAB — COMPREHENSIVE METABOLIC PANEL
ALT: 37 U/L (ref 17–63)
AST: 46 U/L — ABNORMAL HIGH (ref 15–41)
Albumin: 4.1 g/dL (ref 3.5–5.0)
Alkaline Phosphatase: 84 U/L (ref 38–126)
Anion gap: 7 (ref 5–15)
BUN: 11 mg/dL (ref 6–20)
CO2: 25 mmol/L (ref 22–32)
Calcium: 8.6 mg/dL — ABNORMAL LOW (ref 8.9–10.3)
Chloride: 105 mmol/L (ref 101–111)
Creatinine, Ser: 0.67 mg/dL (ref 0.61–1.24)
GFR calc Af Amer: >60 mL/min (ref >60)
GFR calc non Af Amer: >60 mL/min (ref >60)
Glucose, Bld: 181 mg/dL — ABNORMAL HIGH (ref 65–99)
Potassium: 3.3 mmol/L — ABNORMAL LOW (ref 3.5–5.1)
Sodium: 137 mmol/L (ref 135–145)
Total Bilirubin: 0.8 mg/dL (ref 0.3–1.2)
Total Protein: 7.5 g/dL (ref 6.5–8.1)

## 2016-03-25 LAB — MAGNESIUM: Magnesium: 2 mg/dL (ref 1.7–2.4)

## 2016-03-25 MED ORDER — FLUOROURACIL CHEMO INJECTION 2.5 GM/50ML
400.0000 mg/m2 | Freq: Once | INTRAVENOUS | Status: AC
  Administered 2016-03-25: 800 mg via INTRAVENOUS
  Filled 2016-03-25: qty 16

## 2016-03-25 MED ORDER — SODIUM CHLORIDE 0.9 % IV SOLN
Freq: Once | INTRAVENOUS | Status: AC
  Administered 2016-03-25: 09:00:00 via INTRAVENOUS
  Filled 2016-03-25: qty 1000

## 2016-03-25 MED ORDER — LEUCOVORIN CALCIUM INJECTION 100 MG
20.0000 mg/m2 | Freq: Once | INTRAMUSCULAR | Status: AC
  Administered 2016-03-25: 40 mg via INTRAVENOUS
  Filled 2016-03-25: qty 2

## 2016-03-25 MED ORDER — LEUCOVORIN CALCIUM INJECTION 350 MG: 400.0000 mg/m2 | Freq: Once | INTRAVENOUS | Status: DC

## 2016-03-25 MED ORDER — SODIUM CHLORIDE 0.9% FLUSH
10.0000 mL | INTRAVENOUS | Status: DC | PRN
  Administered 2016-03-25: 10 mL
  Filled 2016-03-25: qty 10

## 2016-03-25 MED ORDER — HEPARIN SOD (PORK) LOCK FLUSH 100 UNIT/ML IV SOLN: 500.0000 [IU] | Freq: Once | INTRAVENOUS | Status: DC | PRN

## 2016-03-25 MED ORDER — ONDANSETRON HCL 4 MG PO TABS
8.0000 mg | ORAL_TABLET | Freq: Once | ORAL | Status: AC
  Administered 2016-03-25: 8 mg via ORAL
  Filled 2016-03-25: qty 2

## 2016-03-25 MED ORDER — SODIUM CHLORIDE 0.9 % IV SOLN
2400.0000 mg/m2 | INTRAVENOUS | Status: DC
  Administered 2016-03-25: 4750 mg via INTRAVENOUS
  Filled 2016-03-25: qty 95

## 2016-03-25 NOTE — Telephone Encounter (Signed)
Called patient with the use of an interpreter, Anthony Melendez through [facility] has potassium at home 10 meq but reports that he has not been taking them. Patient instructed to take 1 tablet twice daily for 3 days starting today through the interpreter, voiced understanding and repeated back the instructions, verified the bottle of potassium with the patient through the patient reading the label to the interpreter. Voiced understanding.

## 2016-03-25 NOTE — Progress Notes (Signed)
[facility] day:  03/25/16  Chief Complaint: Anthony Melendez is a 37 y.o. male with metastatic gastric cancer who is seen for review of interval studies and assessment prior to cycle #2 5FU/LV chemotherapy.  HPI:  The patient was last seen in the medical oncology clinic on 03/17/2016.  At that time, noted intermittent epigastric, RUQ and LUQ pain (3/10) and fullness. Exam revealed discomfort on palpation.  Recent upper endoscopy had revealed no evidence of malignancy.  Decision was made to postpone therapy and repeat imaging studies.  Abdominal CT scan on 03/21/2016 revealed new small volume ascites within the abdomen extending over th liver and spleen.  There was increase soft tissue stranding throughout the peritoneal cavity worrisome for recurrence of peritoneal carcinomatosis.  Symptomatically, he denies any abdominal pain.  He had some pain under his umbilicus which extended laterally.  He notes some itching for the past 2 days.  He denies any shortness of breath, wheezing or diarrhea.  He denies any new soaps, lotions, or foods.   Past Medical History:  Diagnosis Date  . Gastric cancer ([place]) 08/2015   Folfox chemo tx's.  Marland Kitchen GERD (gastroesophageal reflux disease)     Past Surgical History:  Procedure Laterality Date  . ESOPHAGOGASTRODUODENOSCOPY (EGD) WITH PROPOFOL N/A 08/02/2015   Procedure: ESOPHAGOGASTRODUODENOSCOPY (EGD) WITH PROPOFOL;  Surgeon: Lucilla Lame, MD;  Location: [facility];  Service: Endoscopy;  Laterality: N/A;  . ESOPHAGOGASTRODUODENOSCOPY (EGD) WITH PROPOFOL N/A 02/26/2016   Procedure: ESOPHAGOGASTRODUODENOSCOPY (EGD) WITH PROPOFOL;  Surgeon: Lucilla Lame, MD;  Location: [facility];  Service: Endoscopy;  Laterality: N/A;  . NO PAST SURGERIES    . PERIPHERAL VASCULAR CATHETERIZATION N/A 08/13/2015   Procedure: Glori Luis Cath Insertion;  Surgeon: Algernon Huxley, MD;  Location: [facility];  Service:  Cardiovascular;  Laterality: N/A;    History reviewed. No pertinent family history.  Social History:  reports that he has never smoked. He has never used smokeless tobacco. He reports that he does not drink alcohol or use drugs.  The patient is from [place].  He speaks [place].  He moves to the [place] in 2000.  He lives in [place] with his mother, father, brother and sister.  He is a Theme park manager.  The patient is accompanied by his brother and the interpreter today.   Allergies: No Known Allergies  Current Medications: Current Outpatient Prescriptions  Medication Sig Dispense Refill  . ferrous sulfate 325 (65 FE) MG EC tablet Take 1 tablet (325 mg total) by mouth daily with breakfast. 30 tablet 3  . HYDROcodone-acetaminophen (NORCO) 5-325 MG tablet Take 1 tablet by mouth every 6 (six) hours as needed for moderate pain. 20 tablet 0  . omeprazole (PRILOSEC) 20 MG capsule Take 1 capsule (20 mg total) by mouth 2 (two) times daily before a meal. 60 capsule 2  . potassium chloride (K-DUR) 10 MEQ tablet Take 1 tablet daily 30 tablet 0  . prochlorperazine (COMPAZINE) 10 MG tablet Take 1 tablet (10 mg total) by mouth every 6 (six) hours as needed for nausea or vomiting. 30 tablet 1   No current facility-administered medications for this visit.    Facility-Administered Medications Ordered in Other Visits  Medication Dose Route Frequency Provider Last Rate Last Dose  . heparin lock flush 100 unit/mL  500 Units Intravenous Once Lequita Asal, MD      . pegfilgrastim (NEULASTA) injection 6 mg  6 mg Subcutaneous Once Lequita Asal, MD      .  sodium chloride 0.9 % injection 10 mL  10 mL Intravenous Once Lequita Asal, MD      . sodium chloride flush (NS) 0.9 % injection 10 mL  10 mL Intravenous PRN Lequita Asal, MD   10 mL at 08/28/15 4174    Review of Systems:  GENERAL:  Feels "ok".  No fevers or sweats.  Weight up 3 pounds. PERFORMANCE STATUS (ECOG):  1 HEENT:  No visual  changes, runny nose, sore throat, mouth sores or tenderness. Lungs:  No shortness of breath or cough.  No hemoptysis. Cardiac:  No chest pain, palpitations, orthopnea, or PND. GI:   Intermittent abdominal pain (see HPI).  No nausea, vomiting, diarrhea, constipation, melena or hematochezia. GU:  No urgency, frequency, dysuria, or hematuria. Musculoskeletal:  No bone pain.  No muscle tenderness.  Takes Claritin to prevent Neulasta induced bone pain. Extremities:  No pain or swelling. Skin:  Itching x 2 days. Neuro:  No headache, numbness or weakness, balance or coordination issues. Endocrine:  No diabetes, thyroid issues, hot flashes or night sweats. Psych:  No mood changes, depression or anxiety. Pain:  Intermittent pain below the umbilicus. Review of systems:  All other systems reviewed and found to be negative.  Physical Exam: Blood pressure 117/78, pulse (!) 58, temperature 97.7 F (36.5 C), temperature source Tympanic, resp. rate 18, weight 195 lb 1.7 oz (88.5 kg). GENERAL:  Well developed, well nourished, gentleman sitting comfortably in the exam room in no acute distress. MENTAL STATUS:  Alert and oriented to person, place and time. HEAD:  Wearing acap.  Black hair.  Thin mustache.  Normocephalic, atraumatic, face symmetric, no Cushingoid features. EYES:  Brown eyes.  No conjunctivitis or scleral icterus. ENT:  Oropharynx clear without lesion.  Tongue normal. Mucous membranes moist.  RESPIRATORY:  Clear to auscultation without rales, wheezes or rhonchi. CARDIOVASCULAR:  Regular rate and rhythm without murmur, rub or gallop. ABDOMEN:  Soft, non-tender with active bowel sounds and no hepatosplenomegaly.  No masses. SKIN:  Excoriations on back with mild erythema.  No evidence of infection. EXTREMITIES: No edema, no skin discoloration or tenderness.  No palpable cords. LYMPH NODES: No palpable cervical, supraclavicular, axillary or inguinal adenopathy  NEUROLOGICAL: Unremarkable. PSYCH:   Appropriate.    Appointment on 03/25/2016  Component Date Value Ref Range Status  . WBC 03/25/2016 3.8  3.8 - 10.6 K/uL Final  . RBC 03/25/2016 4.86  4.40 - 5.90 MIL/uL Final  . Hemoglobin 03/25/2016 15.1  13.0 - 18.0 g/dL Final  . HCT 03/25/2016 43.3  40.0 - 52.0 % Final  . MCV 03/25/2016 89.0  80.0 - 100.0 fL Final  . MCH 03/25/2016 31.1  26.0 - 34.0 pg Final  . MCHC 03/25/2016 34.9  32.0 - 36.0 g/dL Final  . RDW 03/25/2016 14.3  11.5 - 14.5 % Final  . Platelets 03/25/2016 107* 150 - 440 K/uL Final  . Neutrophils Relative % 03/25/2016 60  % Final  . Neutro Abs 03/25/2016 2.3  1.4 - 6.5 K/uL Final  . Lymphocytes Relative 03/25/2016 25  % Final  . Lymphs Abs 03/25/2016 1.0  1.0 - 3.6 K/uL Final  . Monocytes Relative 03/25/2016 11  % Final  . Monocytes Absolute 03/25/2016 0.4  0.2 - 1.0 K/uL Final  . Eosinophils Relative 03/25/2016 3  % Final  . Eosinophils Absolute 03/25/2016 0.1  0 - 0.7 K/uL Final  . Basophils Relative 03/25/2016 1  % Final  . Basophils Absolute 03/25/2016 0.0  0 - 0.1  K/uL Final  . Sodium 03/25/2016 137  135 - 145 mmol/L Final  . Potassium 03/25/2016 3.3* 3.5 - 5.1 mmol/L Final  . Chloride 03/25/2016 105  101 - 111 mmol/L Final  . CO2 03/25/2016 25  22 - 32 mmol/L Final  . Glucose, Bld 03/25/2016 181* 65 - 99 mg/dL Final  . BUN 03/25/2016 11  6 - 20 mg/dL Final  . Creatinine, Ser 03/25/2016 0.67  0.61 - 1.24 mg/dL Final  . Calcium 03/25/2016 8.6* 8.9 - 10.3 mg/dL Final  . Total Protein 03/25/2016 7.5  6.5 - 8.1 g/dL Final  . Albumin 03/25/2016 4.1  3.5 - 5.0 g/dL Final  . AST 03/25/2016 46* 15 - 41 U/L Final  . ALT 03/25/2016 37  17 - 63 U/L Final  . Alkaline Phosphatase 03/25/2016 84  38 - 126 U/L Final  . Total Bilirubin 03/25/2016 0.8  0.3 - 1.2 mg/dL Final  . GFR calc non Af Amer 03/25/2016 >60  >60 mL/min Final  . GFR calc Af Amer 03/25/2016 >60  >60 mL/min Final   Comment: (NOTE) The eGFR has been calculated using the CKD EPI equation. This  calculation has not been validated in all clinical situations. eGFR's persistently <60 mL/min signify possible Chronic Kidney Disease.   . Anion gap 03/25/2016 7  5 - 15 Final  . Magnesium 03/25/2016 2.0  1.7 - 2.4 mg/dL Final    Assessment:  Anthony Melendez is a 35 y.o. male with metastatic gastric cancer.  He presented with a 4-6 week history of progressive epigastric pain.  EGD on 08/02/2015 revealed a large, ulcerated, non-circumferential mass with oozing and stigmata of recent bleeding in the entire examined stomach.  The mass involved the fundus down to the antrum.  Biopsies revealed poorly differentiated adenocarcinoma.  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  Her2/neu testing was negative.  Microsatellite testing was negative.  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  CEA was 0.3 on 07/30/2015.  Chest, abdomen and pelvic CT scan on 07/29/2015 revealed a 4 cm thickened and irregular gastric wall compatible with an infiltrative malignancy. There was extensive involvement of the upper mesentery and omentum compatible with peritoneal carcinomatosis. There was a small amount of ascites.   Head CT on 08/08/2015 revealed no evidence of metastatic disease.  He completed treatment for H pylori  (began 08/09/2015).  Stool was negative for H pylori on 11/01/2015.  He has iron deficiency anemia likely gastric oozing and modest diet.  RBC are microcytic.  Labs on 08/21/2015 revealed a ferritin was 44 with an iron saturation 4% and a TIBC of 395.  Ferritin was 116 on 09/18/2015.  He received 12 cycles of FOLFOX chemotherapy (08/14/2015 - 02/05/2016).  Chemotherapy was held on 10/23/2015 secondary to thrombocytopenia (87,000).  Platelet count was 90,000 prior to cycle #11.  He has tolerated his chemotherapy well without  diarrhea, mouth sores, or neuropathy.  Abdomen and pelvic CT scan on 10/05/2015 revealed a positive response to therapy with decreased size of the primary mass along the lesser curvature of the stomach, decrease extension of the mass into the gastrohepatic ligament, decreasing evidence of peritoneal carcinomatosis involving predominantly the omentum, and resolution of previously noted malignant ascites. There was a potential ulcer along the greater curvature of the mid body of the stomach.  Abdomen and pelvic CT scan on 12/24/2015 revealed further improvement in primary gastric  malignancy and adjacent lymphadenopathy. There was no residual peritoneal carcinomatosis identified.  Abdominal CT scan on 03/21/2016 revealed new small volume ascites within the abdomen extending over th liver and spleen.  There was increase soft tissue stranding throughout the peritoneal cavity worrisome for recurrence of peritoneal carcinomatosis.  EGD on 02/26/2016 revealed one non-bleeding gastric ulcer.   The mass appeared smaller.  Biopsy revealed moderate chronic gastritis and edema, negative for H pylori, dysplasia and malignancy.  He is s/p cycle 1 of maintenance 5FU/LV (03/03/2016).  Symptomatically, he has intermittent abdominal pain.  His back has been pruritic x 2 days.  He is eating well.  Weight is up.  Exam reveals no shifting dullness.  Plan: 1.  Labs today:  CBC with diff, CMP. 2.  Discuss CT results.  Suggestion of early progressive disease.  Not enough fluid to biopsy.  Difficult to biopsy peritoneum.  Discuss continuation of therapy for now.  Discuss sending off additional testing to assess treatment options.  Discuss future use of ramucirumab (Cyramza) + Taxol.  Response rate 8%, with disease control rate 49%.  Also discuss use of pembrolizumab if PDL-1 positive (response rates 22% + 8% stable disease). 3.  Send tissue for PDL-1 testing. 4.  Present at tumor board. 5.  Cycle #2 5FU/LV today 6.  RTC in  2 days for MD assessment of skin and pump disconnect 7.  Discuss Benadryl po q 6 hours prn pruritus. 8.  Note for brother's absence from work/school. 9.  RTC in 1 week for labs (CBC with diff) 10.  RTC in 2 weeks for MD assessment, labs (CBC with diff, CMP, Mg, CEA), and cycle #3 5FU/LV.    Lequita Asal, MD  03/25/2016, 8:42 AM

## 2016-03-25 NOTE — Progress Notes (Signed)
Patient states in the past 2 days he has developed blisters all over his body that itch. After lunch they do not bother him.States they seem to appear when he is warm.  Continues to have pain in lower abdomen that radiates.  Worse when having a BM.

## 2016-03-26 ENCOUNTER — Telehealth: Payer: Self-pay | Admitting: *Deleted

## 2016-03-26 DIAGNOSIS — C169 Malignant neoplasm of stomach, unspecified: Secondary | ICD-10-CM

## 2016-03-26 MED ORDER — OMEPRAZOLE 20 MG PO CPDR: 20.0000 mg | DELAYED_RELEASE_CAPSULE | Freq: Two times a day (BID) | ORAL | 2 refills | Status: DC

## 2016-03-26 NOTE — Telephone Encounter (Signed)
E scribed omeprazole to [employer]

## 2016-03-26 NOTE — Telephone Encounter (Signed)
Patient called to ask what medication MD told him to take for his rash/itching.  Per MD patient to take Benadryl 25 mg every 6 hoursa.  If not better MD will prescribe a prednisone dose pak to help with inflammation.

## 2016-03-27 ENCOUNTER — Inpatient Hospital Stay: Payer: Self-pay

## 2016-03-27 VITALS — BP 125/82 | HR 76 | Temp 98.6°F

## 2016-03-27 DIAGNOSIS — C162 Malignant neoplasm of body of stomach: Secondary | ICD-10-CM

## 2016-03-27 DIAGNOSIS — G620 Drug-induced polyneuropathy: Secondary | ICD-10-CM

## 2016-03-27 DIAGNOSIS — C786 Secondary malignant neoplasm of retroperitoneum and peritoneum: Secondary | ICD-10-CM

## 2016-03-27 DIAGNOSIS — T451X5A Adverse effect of antineoplastic and immunosuppressive drugs, initial encounter: Principal | ICD-10-CM

## 2016-03-27 DIAGNOSIS — C801 Malignant (primary) neoplasm, unspecified: Secondary | ICD-10-CM

## 2016-03-27 MED ORDER — HEPARIN SOD (PORK) LOCK FLUSH 100 UNIT/ML IV SOLN
500.0000 [IU] | Freq: Once | INTRAVENOUS | Status: AC | PRN
  Administered 2016-03-27: 500 [IU]

## 2016-03-27 MED ORDER — SODIUM CHLORIDE 0.9% FLUSH
10.0000 mL | INTRAVENOUS | Status: DC | PRN
  Administered 2016-03-27: 10 mL
  Filled 2016-03-27: qty 10

## 2016-03-31 ENCOUNTER — Ambulatory Visit: Payer: Self-pay | Admitting: Hematology and Oncology

## 2016-03-31 ENCOUNTER — Ambulatory Visit: Payer: Self-pay

## 2016-03-31 ENCOUNTER — Other Ambulatory Visit: Payer: Self-pay

## 2016-04-01 ENCOUNTER — Inpatient Hospital Stay: Payer: Self-pay

## 2016-04-01 DIAGNOSIS — C169 Malignant neoplasm of stomach, unspecified: Secondary | ICD-10-CM

## 2016-04-01 DIAGNOSIS — C786 Secondary malignant neoplasm of retroperitoneum and peritoneum: Secondary | ICD-10-CM

## 2016-04-01 DIAGNOSIS — C801 Malignant (primary) neoplasm, unspecified: Secondary | ICD-10-CM

## 2016-04-01 DIAGNOSIS — C168 Malignant neoplasm of overlapping sites of stomach: Secondary | ICD-10-CM

## 2016-04-01 LAB — COMPREHENSIVE METABOLIC PANEL
ALT: 33 U/L (ref 17–63)
AST: 41 U/L (ref 15–41)
Albumin: 4.4 g/dL (ref 3.5–5.0)
Alkaline Phosphatase: 87 U/L (ref 38–126)
Anion gap: 8 (ref 5–15)
BUN: 15 mg/dL (ref 6–20)
CO2: 24 mmol/L (ref 22–32)
Calcium: 8.8 mg/dL — ABNORMAL LOW (ref 8.9–10.3)
Chloride: 104 mmol/L (ref 101–111)
Creatinine, Ser: 0.71 mg/dL (ref 0.61–1.24)
GFR calc Af Amer: >60 mL/min (ref >60)
GFR calc non Af Amer: >60 mL/min (ref >60)
Glucose, Bld: 218 mg/dL — ABNORMAL HIGH (ref 65–99)
Potassium: 3.6 mmol/L (ref 3.5–5.1)
Sodium: 136 mmol/L (ref 135–145)
Total Bilirubin: 0.6 mg/dL (ref 0.3–1.2)
Total Protein: 8 g/dL (ref 6.5–8.1)

## 2016-04-01 LAB — CBC WITH DIFFERENTIAL/PLATELET
Basophils Absolute: 0 10*3/uL (ref 0–0.1)
Basophils Relative: 1 %
Eosinophils Absolute: 0.1 10*3/uL (ref 0–0.7)
Eosinophils Relative: 1 %
HCT: 43.4 % (ref 40.0–52.0)
Hemoglobin: 15.1 g/dL (ref 13.0–18.0)
Lymphocytes Relative: 31 %
Lymphs Abs: 1.2 10*3/uL (ref 1.0–3.6)
MCH: 31 pg (ref 26.0–34.0)
MCHC: 34.9 g/dL (ref 32.0–36.0)
MCV: 88.9 fL (ref 80.0–100.0)
Monocytes Absolute: 0.2 10*3/uL (ref 0.2–1.0)
Monocytes Relative: 6 %
Neutro Abs: 2.3 10*3/uL (ref 1.4–6.5)
Neutrophils Relative %: 61 %
Platelets: 116 10*3/uL — ABNORMAL LOW (ref 150–440)
RBC: 4.88 MIL/uL (ref 4.40–5.90)
RDW: 13.2 % (ref 11.5–14.5)
WBC: 3.9 10*3/uL (ref 3.8–10.6)

## 2016-04-03 LAB — SURGICAL PATHOLOGY

## 2016-04-04 ENCOUNTER — Encounter: Payer: Self-pay | Admitting: Hematology and Oncology

## 2016-04-07 ENCOUNTER — Other Ambulatory Visit: Payer: Self-pay | Admitting: Oncology

## 2016-04-07 NOTE — Progress Notes (Signed)
[facility] day:  04/08/16  Chief Complaint: Anthony Melendez is a 36 y.o. male with metastatic gastric cancer who is seen for review of interval studies and assessment prior to cycle #3 5FU/LV chemotherapy.  HPI:  The patient was last seen in the medical oncology clinic on 03/25/2016 for Cycle #2.  He continues to report intermittent epigastric, RUQ and LUQ pain (3/10) and fullness.  Continues to have discomfort on palpation.  Recent upper endoscopy on 02/26/16 had revealed no evidence of malignancy.  Decision was made to postpone therapy on 03/17/16 and repeat imaging studies.  Abdominal CT scan on 03/21/2016 revealed new small volume ascites within the abdomen extending over the liver and spleen.  There was increased soft tissue stranding throughout the peritoneal cavity worrisome for recurrence of peritoneal carcinomatosis.  Symptomatically, he reports occasional abdominal pain.  He reports an itchy rash that recurs every morning and is typically gone by noon, unrelieved with benadryl. He denies any new soaps, lotions, or foods.  He reports occasional shortness of breath with abdominal pain. No wheezing or diarrhea.  He also reports intermittent burning with urination for past 15 days.     Past Medical History:  Diagnosis Date  . Gastric cancer ([place]) 08/2015   Folfox chemo tx's.  Marland Kitchen GERD (gastroesophageal reflux disease)     Past Surgical History:  Procedure Laterality Date  . ESOPHAGOGASTRODUODENOSCOPY (EGD) WITH PROPOFOL N/A 08/02/2015   Procedure: ESOPHAGOGASTRODUODENOSCOPY (EGD) WITH PROPOFOL;  Surgeon: Lucilla Lame, MD;  Location: [facility];  Service: Endoscopy;  Laterality: N/A;  . ESOPHAGOGASTRODUODENOSCOPY (EGD) WITH PROPOFOL N/A 02/26/2016   Procedure: ESOPHAGOGASTRODUODENOSCOPY (EGD) WITH PROPOFOL;  Surgeon: Lucilla Lame, MD;  Location: [facility];  Service: Endoscopy;  Laterality: N/A;  . NO PAST SURGERIES    . PERIPHERAL  VASCULAR CATHETERIZATION N/A 08/13/2015   Procedure: Glori Luis Cath Insertion;  Surgeon: Algernon Huxley, MD;  Location: [facility];  Service: Cardiovascular;  Laterality: N/A;    No family history on file.  Social History:  reports that he has never smoked. He has never used smokeless tobacco. He reports that he does not drink alcohol or use drugs.  The patient is from [place].  He speaks [place].  He moves to the [place] in 2000.  He lives in [place] with his mother, father, brother and sister.  He is a Theme park manager.  The patient is accompanied by his brother and the interpreter today.   Allergies: No Known Allergies  Current Medications: Current Outpatient Prescriptions  Medication Sig Dispense Refill  . ferrous sulfate 325 (65 FE) MG EC tablet Take 1 tablet (325 mg total) by mouth daily with breakfast. 30 tablet 3  . HYDROcodone-acetaminophen (NORCO) 5-325 MG tablet Take 1 tablet by mouth every 6 (six) hours as needed for moderate pain. 20 tablet 0  . omeprazole (PRILOSEC) 20 MG capsule Take 1 capsule (20 mg total) by mouth 2 (two) times daily before a meal. 60 capsule 2  . potassium chloride (K-DUR) 10 MEQ tablet Take 1 tablet daily 30 tablet 0  . prochlorperazine (COMPAZINE) 10 MG tablet Take 1 tablet (10 mg total) by mouth every 6 (six) hours as needed for nausea or vomiting. 30 tablet 1   No current facility-administered medications for this visit.    Facility-Administered Medications Ordered in Other Visits  Medication Dose Route Frequency Provider Last Rate Last Dose  . fluorouracil (ADRUCIL) 4,750 mg in sodium chloride 0.9 % 55 mL chemo infusion  2,400 mg/m2 (Order-Specific) Intravenous 1 day or 1 dose Lloyd Huger, MD   4,750 mg at 04/08/16 1125  . heparin lock flush 100 unit/mL  500 Units Intravenous Once Lequita Asal, MD      . pegfilgrastim (NEULASTA) injection 6 mg  6 mg Subcutaneous Once Lequita Asal, MD      . sodium chloride 0.9 % injection 10 mL  10  mL Intravenous Once Lequita Asal, MD      . sodium chloride flush (NS) 0.9 % injection 10 mL  10 mL Intravenous PRN Lequita Asal, MD   10 mL at 08/28/15 0835  . sodium chloride flush (NS) 0.9 % injection 10 mL  10 mL Intracatheter PRN Lloyd Huger, MD   10 mL at 04/08/16 3825    Review of Systems:  GENERAL:  Feels "ok".  No fevers or sweats. PERFORMANCE STATUS (ECOG):  1 HEENT:  No visual changes, runny nose, sore throat, mouth sores or tenderness. Lungs:  Occasional shortness of breath with abdominal pain. No cough.  No hemoptysis. Cardiac:  No chest pain, palpitations, orthopnea, or PND. GI:   Intermittent abdominal pain (see HPI).  No nausea, vomiting, diarrhea, constipation, melena or hematochezia. GU:  No urgency, frequency, dysuria, or hematuria. Musculoskeletal:  No bone pain.  No muscle tenderness.  Takes Claritin to prevent Neulasta induced bone pain. Extremities:  Bilateral hand swelling with morning rash. No pain. Skin:  Itchy intermittent rash every morning x 15 days Neuro:  No headache, numbness or weakness, balance or coordination issues. Endocrine:  No diabetes, thyroid issues, hot flashes or night sweats. Psych:  No mood changes, depression or anxiety. Pain:  Intermittent pain below the umbilicus. Review of systems:  All other systems reviewed and found to be negative.  Physical Exam: Blood pressure 133/85, pulse 73, temperature 98.7 F (37.1 C), temperature source Tympanic, resp. rate 18, weight 196 lb 13.9 oz (89.3 kg). GENERAL:  Well developed, well nourished, gentleman sitting comfortably in the exam room in no acute distress. Accompanied by male family member, interpreter in room. MENTAL STATUS:  Alert and oriented to person, place and time. HEAD:  Wearing a cap.  Black hair.  Thin mustache.  Normocephalic, atraumatic, face symmetric, no Cushingoid features. EYES:  Brown eyes.  No conjunctivitis or scleral icterus. ENT:  Oropharynx clear without  lesion.  Tongue normal. Mucous membranes moist.  RESPIRATORY:  Clear to auscultation without rales, wheezes or rhonchi. CARDIOVASCULAR:  Regular rate and rhythm without murmur, rub or gallop. ABDOMEN:  Soft, non-tender with active bowel sounds and no hepatosplenomegaly.  No masses. SKIN:  Diffuse raised reddened edematous patches on hands, arms, abdomen.  No evidence of infection. EXTREMITIES: Mild non-pitting edema in hands, at site of lesions. No tenderness.  No palpable cords. LYMPH NODES: No palpable cervical, supraclavicular, axillary or inguinal adenopathy  NEUROLOGICAL: Unremarkable. PSYCH:  Appropriate.    Office Visit on 04/08/2016  Component Date Value Ref Range Status  . WBC 04/08/2016 4.8  3.8 - 10.6 K/uL Final  . RBC 04/08/2016 4.96  4.40 - 5.90 MIL/uL Final  . Hemoglobin 04/08/2016 15.4  13.0 - 18.0 g/dL Final  . HCT 04/08/2016 43.9  40.0 - 52.0 % Final  . MCV 04/08/2016 88.5  80.0 - 100.0 fL Final  . MCH 04/08/2016 31.1  26.0 - 34.0 pg Final  . MCHC 04/08/2016 35.2  32.0 - 36.0 g/dL Final  . RDW 04/08/2016 13.4  11.5 - 14.5 % Final  . Platelets  04/08/2016 122* 150 - 440 K/uL Final  . Neutrophils Relative % 04/08/2016 65  % Final  . Neutro Abs 04/08/2016 3.1  1.4 - 6.5 K/uL Final  . Lymphocytes Relative 04/08/2016 26  % Final  . Lymphs Abs 04/08/2016 1.3  1.0 - 3.6 K/uL Final  . Monocytes Relative 04/08/2016 7  % Final  . Monocytes Absolute 04/08/2016 0.4  0.2 - 1.0 K/uL Final  . Eosinophils Relative 04/08/2016 1  % Final  . Eosinophils Absolute 04/08/2016 0.1  0 - 0.7 K/uL Final  . Basophils Relative 04/08/2016 1  % Final  . Basophils Absolute 04/08/2016 0.0  0 - 0.1 K/uL Final  . Sodium 04/08/2016 136  135 - 145 mmol/L Final  . Potassium 04/08/2016 3.5  3.5 - 5.1 mmol/L Final  . Chloride 04/08/2016 104  101 - 111 mmol/L Final  . CO2 04/08/2016 26  22 - 32 mmol/L Final  . Glucose, Bld 04/08/2016 178* 65 - 99 mg/dL Final  . BUN 04/08/2016 13  6 - 20 mg/dL Final  .  Creatinine, Ser 04/08/2016 0.69  0.61 - 1.24 mg/dL Final  . Calcium 04/08/2016 8.6* 8.9 - 10.3 mg/dL Final  . Total Protein 04/08/2016 7.4  6.5 - 8.1 g/dL Final  . Albumin 04/08/2016 4.2  3.5 - 5.0 g/dL Final  . AST 04/08/2016 36  15 - 41 U/L Final  . ALT 04/08/2016 25  17 - 63 U/L Final  . Alkaline Phosphatase 04/08/2016 81  38 - 126 U/L Final  . Total Bilirubin 04/08/2016 0.6  0.3 - 1.2 mg/dL Final  . GFR calc non Af Amer 04/08/2016 >60  >60 mL/min Final  . GFR calc Af Amer 04/08/2016 >60  >60 mL/min Final   Comment: (NOTE) The eGFR has been calculated using the CKD EPI equation. This calculation has not been validated in all clinical situations. eGFR's persistently <60 mL/min signify possible Chronic Kidney Disease.   . Anion gap 04/08/2016 6  5 - 15 Final  . Magnesium 04/08/2016 2.0  1.7 - 2.4 mg/dL Final    Assessment:  Anthony Melendez is a 36 y.o. male with metastatic gastric cancer.  He presented with a 4-6 week history of progressive epigastric pain.  EGD on 08/02/2015 revealed a large, ulcerated, non-circumferential mass with oozing and stigmata of recent bleeding in the entire examined stomach.  The mass involved the fundus down to the antrum.  Biopsies revealed poorly differentiated adenocarcinoma.  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  Her2/neu testing was negative.  Microsatellite testing was negative.  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  CEA was 0.3 on 07/30/2015.  Chest, abdomen and pelvic CT scan on 07/29/2015 revealed a 4 cm thickened and irregular gastric wall compatible with an infiltrative malignancy. There was extensive involvement of the upper mesentery and omentum compatible with peritoneal carcinomatosis. There was a small amount of ascites.   Head CT on 08/08/2015 revealed no  evidence of metastatic disease.  He completed treatment for H pylori  (began 08/09/2015).  Stool was negative for H pylori on 11/01/2015.  He has iron deficiency anemia likely gastric oozing and modest diet.  RBC are microcytic.  Labs on 08/21/2015 revealed a ferritin was 44 with an iron saturation 4% and a TIBC of 395.  Ferritin was 116 on 09/18/2015.  He received 12 cycles of FOLFOX chemotherapy (08/14/2015 - 02/05/2016).  Chemotherapy  was held on 10/23/2015 secondary to thrombocytopenia (87,000).  Platelet count was 90,000 prior to cycle #11.  He has tolerated his chemotherapy well without diarrhea, mouth sores, or neuropathy.  Abdomen and pelvic CT scan on 10/05/2015 revealed a positive response to therapy with decreased size of the primary mass along the lesser curvature of the stomach, decrease extension of the mass into the gastrohepatic ligament, decreasing evidence of peritoneal carcinomatosis involving predominantly the omentum, and resolution of previously noted malignant ascites. There was a potential ulcer along the greater curvature of the mid body of the stomach.  Abdomen and pelvic CT scan on 12/24/2015 revealed further improvement in primary gastric malignancy and adjacent lymphadenopathy. There was no residual peritoneal carcinomatosis identified.  Abdominal CT scan on 03/21/2016 revealed new small volume ascites within the abdomen extending over th liver and spleen.  There was increase soft tissue stranding throughout the peritoneal cavity worrisome for recurrence of peritoneal carcinomatosis.  EGD on 02/26/2016 revealed one non-bleeding gastric ulcer.   The mass appeared smaller.  Biopsy revealed moderate chronic gastritis and edema, negative for H pylori, dysplasia and malignancy.  He is s/p cycle 2 of maintenance 5FU/LV (03/25/2016).  Symptomatically, he has intermittent abdominal pain. He has diffuse itching with intermittent rash.  He is eating well.  Weight is up.  Exam reveals  no shifting dullness.  Plan: 1.  Labs today:  CBC with diff, CMP. No UA low suspicion for UTI d/t intermittent nature of dysuria. 2.  Cycle #3 5FU/LV today 3.  RTC in 2 days for pump disconnect 4.  Medrol dose pack called into pt's pharmacy for itching rash (confirmed receipt with pharmacy) 5.  RTC in 2 weeks for MD assessment, labs (CBC with diff, CMP, Mg, CEA), and cycle #4 5FU/LV.    Lucendia Herrlich, NP  04/08/2016, 1:25 PM   Patient was seen and evaluated independently and I agree with the assessment and plan as dictated above.  Lloyd Huger, MD 04/11/16 4:52 PM

## 2016-04-08 ENCOUNTER — Inpatient Hospital Stay: Payer: Self-pay | Attending: Hematology and Oncology | Admitting: Oncology

## 2016-04-08 ENCOUNTER — Inpatient Hospital Stay: Payer: Self-pay

## 2016-04-08 ENCOUNTER — Other Ambulatory Visit: Payer: Self-pay | Admitting: Hematology and Oncology

## 2016-04-08 ENCOUNTER — Encounter: Payer: Self-pay | Admitting: Hematology and Oncology

## 2016-04-08 VITALS — BP 133/85 | HR 73 | Temp 98.7°F | Resp 18 | Wt 196.9 lb

## 2016-04-08 DIAGNOSIS — C786 Secondary malignant neoplasm of retroperitoneum and peritoneum: Secondary | ICD-10-CM | POA: Insufficient documentation

## 2016-04-08 DIAGNOSIS — R1012 Left upper quadrant pain: Secondary | ICD-10-CM | POA: Insufficient documentation

## 2016-04-08 DIAGNOSIS — C162 Malignant neoplasm of body of stomach: Secondary | ICD-10-CM | POA: Insufficient documentation

## 2016-04-08 DIAGNOSIS — K219 Gastro-esophageal reflux disease without esophagitis: Secondary | ICD-10-CM | POA: Insufficient documentation

## 2016-04-08 DIAGNOSIS — Z79899 Other long term (current) drug therapy: Secondary | ICD-10-CM | POA: Insufficient documentation

## 2016-04-08 DIAGNOSIS — R1011 Right upper quadrant pain: Secondary | ICD-10-CM | POA: Insufficient documentation

## 2016-04-08 DIAGNOSIS — Z5111 Encounter for antineoplastic chemotherapy: Secondary | ICD-10-CM | POA: Insufficient documentation

## 2016-04-08 DIAGNOSIS — R0602 Shortness of breath: Secondary | ICD-10-CM | POA: Insufficient documentation

## 2016-04-08 DIAGNOSIS — K2950 Unspecified chronic gastritis without bleeding: Secondary | ICD-10-CM | POA: Insufficient documentation

## 2016-04-08 DIAGNOSIS — R188 Other ascites: Secondary | ICD-10-CM | POA: Insufficient documentation

## 2016-04-08 DIAGNOSIS — C801 Malignant (primary) neoplasm, unspecified: Secondary | ICD-10-CM

## 2016-04-08 DIAGNOSIS — R21 Rash and other nonspecific skin eruption: Secondary | ICD-10-CM | POA: Insufficient documentation

## 2016-04-08 DIAGNOSIS — D509 Iron deficiency anemia, unspecified: Secondary | ICD-10-CM | POA: Insufficient documentation

## 2016-04-08 DIAGNOSIS — R140 Abdominal distension (gaseous): Secondary | ICD-10-CM | POA: Insufficient documentation

## 2016-04-08 DIAGNOSIS — Z1509 Genetic susceptibility to other malignant neoplasm: Secondary | ICD-10-CM | POA: Insufficient documentation

## 2016-04-08 LAB — CBC WITH DIFFERENTIAL/PLATELET
Basophils Absolute: 0 10*3/uL (ref 0–0.1)
Basophils Relative: 1 %
Eosinophils Absolute: 0.1 10*3/uL (ref 0–0.7)
Eosinophils Relative: 1 %
HCT: 43.9 % (ref 40.0–52.0)
Hemoglobin: 15.4 g/dL (ref 13.0–18.0)
Lymphocytes Relative: 26 %
Lymphs Abs: 1.3 10*3/uL (ref 1.0–3.6)
MCH: 31.1 pg (ref 26.0–34.0)
MCHC: 35.2 g/dL (ref 32.0–36.0)
MCV: 88.5 fL (ref 80.0–100.0)
Monocytes Absolute: 0.4 10*3/uL (ref 0.2–1.0)
Monocytes Relative: 7 %
Neutro Abs: 3.1 10*3/uL (ref 1.4–6.5)
Neutrophils Relative %: 65 %
Platelets: 122 10*3/uL — ABNORMAL LOW (ref 150–440)
RBC: 4.96 MIL/uL (ref 4.40–5.90)
RDW: 13.4 % (ref 11.5–14.5)
WBC: 4.8 10*3/uL (ref 3.8–10.6)

## 2016-04-08 LAB — COMPREHENSIVE METABOLIC PANEL
ALT: 25 U/L (ref 17–63)
AST: 36 U/L (ref 15–41)
Albumin: 4.2 g/dL (ref 3.5–5.0)
Alkaline Phosphatase: 81 U/L (ref 38–126)
Anion gap: 6 (ref 5–15)
BUN: 13 mg/dL (ref 6–20)
CO2: 26 mmol/L (ref 22–32)
Calcium: 8.6 mg/dL — ABNORMAL LOW (ref 8.9–10.3)
Chloride: 104 mmol/L (ref 101–111)
Creatinine, Ser: 0.69 mg/dL (ref 0.61–1.24)
GFR calc Af Amer: >60 mL/min (ref >60)
GFR calc non Af Amer: >60 mL/min (ref >60)
Glucose, Bld: 178 mg/dL — ABNORMAL HIGH (ref 65–99)
Potassium: 3.5 mmol/L (ref 3.5–5.1)
Sodium: 136 mmol/L (ref 135–145)
Total Bilirubin: 0.6 mg/dL (ref 0.3–1.2)
Total Protein: 7.4 g/dL (ref 6.5–8.1)

## 2016-04-08 LAB — MAGNESIUM: Magnesium: 2 mg/dL (ref 1.7–2.4)

## 2016-04-08 MED ORDER — SODIUM CHLORIDE 0.9% FLUSH
10.0000 mL | INTRAVENOUS | Status: DC | PRN
  Administered 2016-04-08: 10 mL
  Filled 2016-04-08: qty 10

## 2016-04-08 MED ORDER — LEUCOVORIN CALCIUM INJECTION 100 MG
20.0000 mg/m2 | Freq: Once | INTRAMUSCULAR | Status: AC
  Administered 2016-04-08: 40 mg via INTRAVENOUS
  Filled 2016-04-08: qty 2

## 2016-04-08 MED ORDER — LEUCOVORIN CALCIUM INJECTION 350 MG: 400.0000 mg/m2 | Freq: Once | INTRAVENOUS | Status: DC

## 2016-04-08 MED ORDER — SODIUM CHLORIDE 0.9 % IV SOLN
2400.0000 mg/m2 | INTRAVENOUS | Status: DC
  Administered 2016-04-08: 4750 mg via INTRAVENOUS
  Filled 2016-04-08: qty 95

## 2016-04-08 MED ORDER — FLUOROURACIL CHEMO INJECTION 2.5 GM/50ML
400.0000 mg/m2 | Freq: Once | INTRAVENOUS | Status: AC
  Administered 2016-04-08: 800 mg via INTRAVENOUS
  Filled 2016-04-08: qty 16

## 2016-04-08 MED ORDER — SODIUM CHLORIDE 0.9 % IV SOLN
Freq: Once | INTRAVENOUS | Status: AC
  Administered 2016-04-08: 10:00:00 via INTRAVENOUS
  Filled 2016-04-08: qty 1000

## 2016-04-08 MED ORDER — ONDANSETRON HCL 4 MG PO TABS
8.0000 mg | ORAL_TABLET | Freq: Once | ORAL | Status: AC
  Administered 2016-04-08: 8 mg via ORAL
  Filled 2016-04-08: qty 2

## 2016-04-08 NOTE — Progress Notes (Signed)
Patient states when he takes a deep breath or turns to his side he has pain in his abdomen.  At last visit patient complained of rash that appears on his body in the morning after sleeping overnight.  States it is only in the morning when his body is warm.  Dr. Mike Gip advised him to take OTC benadryl and if that did not help him she was going to give him steroids.  Patient states it is no better with taking the Benadryl.  Patient also complains of burning on urination.

## 2016-04-09 ENCOUNTER — Encounter: Payer: Self-pay | Admitting: Hematology and Oncology

## 2016-04-09 LAB — CEA: CEA: 1 ng/mL (ref 0.0–4.7)

## 2016-04-10 ENCOUNTER — Inpatient Hospital Stay: Payer: Self-pay

## 2016-04-10 ENCOUNTER — Other Ambulatory Visit: Payer: Self-pay

## 2016-04-10 ENCOUNTER — Ambulatory Visit: Payer: Self-pay

## 2016-04-10 ENCOUNTER — Ambulatory Visit: Payer: Self-pay | Admitting: Hematology and Oncology

## 2016-04-10 VITALS — BP 123/80 | HR 102 | Temp 97.0°F | Resp 18

## 2016-04-10 DIAGNOSIS — C801 Malignant (primary) neoplasm, unspecified: Secondary | ICD-10-CM

## 2016-04-10 DIAGNOSIS — C786 Secondary malignant neoplasm of retroperitoneum and peritoneum: Secondary | ICD-10-CM

## 2016-04-10 DIAGNOSIS — C162 Malignant neoplasm of body of stomach: Secondary | ICD-10-CM

## 2016-04-10 MED ORDER — SODIUM CHLORIDE 0.9% FLUSH
10.0000 mL | INTRAVENOUS | Status: DC | PRN
  Administered 2016-04-10: 10 mL
  Filled 2016-04-10: qty 10

## 2016-04-10 MED ORDER — HEPARIN SOD (PORK) LOCK FLUSH 100 UNIT/ML IV SOLN
500.0000 [IU] | Freq: Once | INTRAVENOUS | Status: AC | PRN
  Administered 2016-04-10: 500 [IU]

## 2016-04-10 MED ORDER — HEPARIN SOD (PORK) LOCK FLUSH 100 UNIT/ML IV SOLN
INTRAVENOUS | Status: AC
  Filled 2016-04-10: qty 5

## 2016-04-18 ENCOUNTER — Telehealth: Payer: Self-pay

## 2016-04-18 NOTE — Telephone Encounter (Signed)
-----   Message from Lucilla Lame, MD sent at 04/03/2016 12:11 PM EST ----- This patient has stomach cancer and he is aware but the biopsies came back with H. pylori positive. The patient should be treated for his H. pylori.

## 2016-04-18 NOTE — Telephone Encounter (Signed)
These results are from 2017. Not sure why we received. Pt has already been treated for H pylori.

## 2016-04-22 ENCOUNTER — Inpatient Hospital Stay (HOSPITAL_BASED_OUTPATIENT_CLINIC_OR_DEPARTMENT_OTHER): Payer: Self-pay | Admitting: Hematology and Oncology

## 2016-04-22 ENCOUNTER — Inpatient Hospital Stay: Payer: Self-pay

## 2016-04-22 ENCOUNTER — Encounter: Payer: Self-pay | Admitting: Hematology and Oncology

## 2016-04-22 VITALS — BP 130/94 | HR 80 | Temp 95.8°F | Resp 18 | Wt 195.4 lb

## 2016-04-22 DIAGNOSIS — K2950 Unspecified chronic gastritis without bleeding: Secondary | ICD-10-CM

## 2016-04-22 DIAGNOSIS — R1012 Left upper quadrant pain: Secondary | ICD-10-CM

## 2016-04-22 DIAGNOSIS — D509 Iron deficiency anemia, unspecified: Secondary | ICD-10-CM

## 2016-04-22 DIAGNOSIS — C162 Malignant neoplasm of body of stomach: Secondary | ICD-10-CM

## 2016-04-22 DIAGNOSIS — C786 Secondary malignant neoplasm of retroperitoneum and peritoneum: Secondary | ICD-10-CM

## 2016-04-22 DIAGNOSIS — R21 Rash and other nonspecific skin eruption: Secondary | ICD-10-CM

## 2016-04-22 DIAGNOSIS — C168 Malignant neoplasm of overlapping sites of stomach: Secondary | ICD-10-CM

## 2016-04-22 DIAGNOSIS — R0602 Shortness of breath: Secondary | ICD-10-CM

## 2016-04-22 DIAGNOSIS — Z79899 Other long term (current) drug therapy: Secondary | ICD-10-CM

## 2016-04-22 DIAGNOSIS — C801 Malignant (primary) neoplasm, unspecified: Secondary | ICD-10-CM

## 2016-04-22 DIAGNOSIS — R188 Other ascites: Secondary | ICD-10-CM

## 2016-04-22 DIAGNOSIS — R140 Abdominal distension (gaseous): Secondary | ICD-10-CM

## 2016-04-22 DIAGNOSIS — Z1509 Genetic susceptibility to other malignant neoplasm: Secondary | ICD-10-CM

## 2016-04-22 DIAGNOSIS — K219 Gastro-esophageal reflux disease without esophagitis: Secondary | ICD-10-CM

## 2016-04-22 DIAGNOSIS — C169 Malignant neoplasm of stomach, unspecified: Secondary | ICD-10-CM

## 2016-04-22 DIAGNOSIS — L509 Urticaria, unspecified: Secondary | ICD-10-CM | POA: Insufficient documentation

## 2016-04-22 DIAGNOSIS — R1011 Right upper quadrant pain: Secondary | ICD-10-CM

## 2016-04-22 LAB — CBC WITH DIFFERENTIAL/PLATELET
Basophils Absolute: 0 10*3/uL (ref 0–0.1)
Basophils Relative: 1 %
Eosinophils Absolute: 0 10*3/uL (ref 0–0.7)
Eosinophils Relative: 1 %
HCT: 44.9 % (ref 40.0–52.0)
Hemoglobin: 15.8 g/dL (ref 13.0–18.0)
Lymphocytes Relative: 22 %
Lymphs Abs: 1.2 10*3/uL (ref 1.0–3.6)
MCH: 31 pg (ref 26.0–34.0)
MCHC: 35.1 g/dL (ref 32.0–36.0)
MCV: 88.3 fL (ref 80.0–100.0)
Monocytes Absolute: 0.5 10*3/uL (ref 0.2–1.0)
Monocytes Relative: 9 %
Neutro Abs: 3.5 10*3/uL (ref 1.4–6.5)
Neutrophils Relative %: 67 %
Platelets: 108 10*3/uL — ABNORMAL LOW (ref 150–440)
RBC: 5.08 MIL/uL (ref 4.40–5.90)
RDW: 13.4 % (ref 11.5–14.5)
WBC: 5.3 10*3/uL (ref 3.8–10.6)

## 2016-04-22 LAB — COMPREHENSIVE METABOLIC PANEL
ALT: 27 U/L (ref 17–63)
AST: 35 U/L (ref 15–41)
Albumin: 4 g/dL (ref 3.5–5.0)
Alkaline Phosphatase: 77 U/L (ref 38–126)
Anion gap: 5 (ref 5–15)
BUN: 14 mg/dL (ref 6–20)
CO2: 27 mmol/L (ref 22–32)
Calcium: 8.7 mg/dL — ABNORMAL LOW (ref 8.9–10.3)
Chloride: 102 mmol/L (ref 101–111)
Creatinine, Ser: 0.66 mg/dL (ref 0.61–1.24)
GFR calc Af Amer: >60 mL/min (ref >60)
GFR calc non Af Amer: >60 mL/min (ref >60)
Glucose, Bld: 222 mg/dL — ABNORMAL HIGH (ref 65–99)
Potassium: 3.7 mmol/L (ref 3.5–5.1)
Sodium: 134 mmol/L — ABNORMAL LOW (ref 135–145)
Total Bilirubin: 0.6 mg/dL (ref 0.3–1.2)
Total Protein: 7.3 g/dL (ref 6.5–8.1)

## 2016-04-22 MED ORDER — HEPARIN SOD (PORK) LOCK FLUSH 100 UNIT/ML IV SOLN
500.0000 [IU] | Freq: Once | INTRAVENOUS | Status: AC
  Administered 2016-04-22: 500 [IU] via INTRAVENOUS
  Filled 2016-04-22: qty 5

## 2016-04-22 MED ORDER — SODIUM CHLORIDE 0.9% FLUSH
10.0000 mL | INTRAVENOUS | Status: DC | PRN
  Filled 2016-04-22: qty 10

## 2016-04-22 NOTE — Progress Notes (Signed)
[facility] day:  04/22/16  Chief Complaint: Anthony Melendez is a 36 y.o. male with metastatic gastric cancer who is seen for review of interval studies and assessment prior to cycle #4 5FU/LV chemotherapy.  HPI:  The patient was last seen in the medical oncology clinic by me on 03/25/2016.  At that time, he had intermittent abdominal pain.  Scans suggested possible early progression.  There was not enough fluid to biopsy.  It was felt difficult to biopsy peritoneum.  We discussed continuation of therapy with plans for close follow-up imaging.  Discuss sending off additional testing to assess treatment options. PDL-1 testing was positive.  He was seen by Dr. Delight Hoh on 04/08/2016 in my absence.  He notes occasional abdominal pain.  He had an itchy rash that recurred every morning and was typically gone by noon.  He received cycle #3 5FU/LV.  He also received a Medrol dose pack.  During the interim, he has some lower abdominal discomfort. He denies any nausea, vomiting, diarrhea, or constipation.  He denies any abdominal distention.  He states that his rash and pruritus improved dramatically on the Medrol dose pack (completed 04/14/2016).  He states that his rash and pruritus returned on 04/20/2016.  He denies any new medications, lotions, soaps, colognes or detergents.   Past Medical History:  Diagnosis Date  . Gastric cancer ([place]) 08/2015   Folfox chemo tx's.  Marland Kitchen GERD (gastroesophageal reflux disease)     Past Surgical History:  Procedure Laterality Date  . ESOPHAGOGASTRODUODENOSCOPY (EGD) WITH PROPOFOL N/A 08/02/2015   Procedure: ESOPHAGOGASTRODUODENOSCOPY (EGD) WITH PROPOFOL;  Surgeon: Lucilla Lame, MD;  Location: [facility];  Service: Endoscopy;  Laterality: N/A;  . ESOPHAGOGASTRODUODENOSCOPY (EGD) WITH PROPOFOL N/A 02/26/2016   Procedure: ESOPHAGOGASTRODUODENOSCOPY (EGD) WITH PROPOFOL;  Surgeon: Lucilla Lame, MD;  Location: [facility]  ENDOSCOPY;  Service: Endoscopy;  Laterality: N/A;  . NO PAST SURGERIES    . PERIPHERAL VASCULAR CATHETERIZATION N/A 08/13/2015   Procedure: Glori Luis Cath Insertion;  Surgeon: Algernon Huxley, MD;  Location: [facility];  Service: Cardiovascular;  Laterality: N/A;    History reviewed. No pertinent family history.  Social History:  reports that he has never smoked. He has never used smokeless tobacco. He reports that he does not drink alcohol or use drugs.  The patient is from [place].  He speaks [place].  He moves to the [place] in 2000.  He lives in [place] with his mother, father, brother and sister.  He is a Theme park manager.  The patient is accompanied by his father and the interpreter today.   Allergies: No Known Allergies  Current Medications: Current Outpatient Prescriptions  Medication Sig Dispense Refill  . ferrous sulfate 325 (65 FE) MG EC tablet Take 1 tablet (325 mg total) by mouth daily with breakfast. 30 tablet 3  . HYDROcodone-acetaminophen (NORCO) 5-325 MG tablet Take 1 tablet by mouth every 6 (six) hours as needed for moderate pain. 20 tablet 0  . omeprazole (PRILOSEC) 20 MG capsule Take 1 capsule (20 mg total) by mouth 2 (two) times daily before a meal. 60 capsule 2  . potassium chloride (K-DUR) 10 MEQ tablet Take 1 tablet daily 30 tablet 0  . prochlorperazine (COMPAZINE) 10 MG tablet Take 1 tablet (10 mg total) by mouth every 6 (six) hours as needed for nausea or vomiting. 30 tablet 1   No current facility-administered medications for this visit.    Facility-Administered Medications Ordered in Other Visits  Medication Dose Route Frequency Provider Last Rate Last Dose  . heparin lock flush 100 unit/mL  500 Units Intravenous Once Lequita Asal, MD      . heparin lock flush 100 unit/mL  500 Units Intravenous Once Lequita Asal, MD      . pegfilgrastim (NEULASTA) injection 6 mg  6 mg Subcutaneous Once Lequita Asal, MD      . sodium chloride 0.9 % injection 10  mL  10 mL Intravenous Once Lequita Asal, MD      . sodium chloride flush (NS) 0.9 % injection 10 mL  10 mL Intravenous PRN Lequita Asal, MD   10 mL at 08/28/15 0835  . sodium chloride flush (NS) 0.9 % injection 10 mL  10 mL Intravenous PRN Lequita Asal, MD        Review of Systems:  GENERAL:  Feels "fine".  No fevers or sweats.  Weight down 1 pound. PERFORMANCE STATUS (ECOG):  1 HEENT:  No visual changes, runny nose, sore throat, mouth sores or tenderness. Lungs:  No shortness of breath or cough.  No hemoptysis. Cardiac:  No chest pain, palpitations, orthopnea, or PND. GI:   Intermittent abdominal pain.  No nausea, vomiting, diarrhea, constipation, melena or hematochezia. GU:  No urgency, frequency, dysuria, or hematuria. Musculoskeletal:  No bone pain.  No muscle tenderness.  Takes Claritin to prevent Neulasta induced bone pain. Extremities:  No pain or swelling. Skin:  "All over" pruritic body rash. Neuro:  No headache, numbness or weakness, balance or coordination issues. Endocrine:  No diabetes, thyroid issues, hot flashes or night sweats. Psych:  No mood changes, depression or anxiety. Pain: No pain. Review of systems:  All other systems reviewed and found to be negative.  Physical Exam: Blood pressure (!) 130/94, pulse 80, temperature (!) 95.8 F (35.4 C), temperature source Tympanic, resp. rate 18, weight 195 lb 7 oz (88.6 kg). GENERAL:  Well developed, well nourished, gentleman sitting comfortably in the exam room in no acute distress. MENTAL STATUS:  Alert and oriented to person, place and time. HEAD:  Wearing a maroon Nike cap.  Black hair.  Thin mustache.  Normocephalic, atraumatic, face symmetric, no Cushingoid features. EYES:  Brown eyes.  No conjunctivitis or scleral icterus. ENT:  Oropharynx clear without lesion.  Tongue normal. Mucous membranes moist.  RESPIRATORY:  Clear to auscultation without rales, wheezes or rhonchi. CARDIOVASCULAR:  Regular rate  and rhythm without murmur, rub or gallop. ABDOMEN:  Slightly distended.  Soft, non-tender with active bowel sounds and no hepatosplenomegaly.  No masses. SKIN:  Urticaria predominantly involving back and abdomen. EXTREMITIES: No edema, no skin discoloration or tenderness.  No palpable cords. LYMPH NODES: No palpable cervical, supraclavicular, axillary or inguinal adenopathy  NEUROLOGICAL: Unremarkable. PSYCH:  Appropriate.    Infusion on 04/22/2016  Component Date Value Ref Range Status  . WBC 04/22/2016 5.3  3.8 - 10.6 K/uL Final  . RBC 04/22/2016 5.08  4.40 - 5.90 MIL/uL Final  . Hemoglobin 04/22/2016 15.8  13.0 - 18.0 g/dL Final  . HCT 04/22/2016 44.9  40.0 - 52.0 % Final  . MCV 04/22/2016 88.3  80.0 - 100.0 fL Final  . MCH 04/22/2016 31.0  26.0 - 34.0 pg Final  . MCHC 04/22/2016 35.1  32.0 - 36.0 g/dL Final  . RDW 04/22/2016 13.4  11.5 - 14.5 % Final  . Platelets 04/22/2016 108* 150 - 440 K/uL Final  . Neutrophils Relative % 04/22/2016 67  % Final  .  Neutro Abs 04/22/2016 3.5  1.4 - 6.5 K/uL Final  . Lymphocytes Relative 04/22/2016 22  % Final  . Lymphs Abs 04/22/2016 1.2  1.0 - 3.6 K/uL Final  . Monocytes Relative 04/22/2016 9  % Final  . Monocytes Absolute 04/22/2016 0.5  0.2 - 1.0 K/uL Final  . Eosinophils Relative 04/22/2016 1  % Final  . Eosinophils Absolute 04/22/2016 0.0  0 - 0.7 K/uL Final  . Basophils Relative 04/22/2016 1  % Final  . Basophils Absolute 04/22/2016 0.0  0 - 0.1 K/uL Final  . Sodium 04/22/2016 134* 135 - 145 mmol/L Final  . Potassium 04/22/2016 3.7  3.5 - 5.1 mmol/L Final  . Chloride 04/22/2016 102  101 - 111 mmol/L Final  . CO2 04/22/2016 27  22 - 32 mmol/L Final  . Glucose, Bld 04/22/2016 222* 65 - 99 mg/dL Final  . BUN 04/22/2016 14  6 - 20 mg/dL Final  . Creatinine, Ser 04/22/2016 0.66  0.61 - 1.24 mg/dL Final  . Calcium 04/22/2016 8.7* 8.9 - 10.3 mg/dL Final  . Total Protein 04/22/2016 7.3  6.5 - 8.1 g/dL Final  . Albumin 04/22/2016 4.0  3.5 -  5.0 g/dL Final  . AST 04/22/2016 35  15 - 41 U/L Final  . ALT 04/22/2016 27  17 - 63 U/L Final  . Alkaline Phosphatase 04/22/2016 77  38 - 126 U/L Final  . Total Bilirubin 04/22/2016 0.6  0.3 - 1.2 mg/dL Final  . GFR calc non Af Amer 04/22/2016 >60  >60 mL/min Final  . GFR calc Af Amer 04/22/2016 >60  >60 mL/min Final   Comment: (NOTE) The eGFR has been calculated using the CKD EPI equation. This calculation has not been validated in all clinical situations. eGFR's persistently <60 mL/min signify possible Chronic Kidney Disease.   . Anion gap 04/22/2016 5  5 - 15 Final    Assessment:  Anthony Melendez is a 36 y.o. male with metastatic gastric cancer.  He presented with a 4-6 week history of progressive epigastric pain.  EGD on 08/02/2015 revealed a large, ulcerated, non-circumferential mass with oozing and stigmata of recent bleeding in the entire examined stomach.  The mass involved the fundus down to the antrum.  Biopsies revealed poorly differentiated adenocarcinoma.  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  Her2/neu testing was negative.  Microsatellite testing was negative.  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  PDL-1 was expressed.  CEA was 0.3 on 07/30/2015.  Chest, abdomen and pelvic CT scan on 07/29/2015 revealed a 4 cm thickened and irregular gastric wall compatible with an infiltrative malignancy. There was extensive involvement of the upper mesentery and omentum compatible with peritoneal carcinomatosis. There was a small amount of ascites.   Head CT on 08/08/2015 revealed no evidence of metastatic disease.  He completed treatment for H pylori  (began 08/09/2015).  Stool was negative for H pylori on 11/01/2015.  He has iron deficiency anemia likely gastric oozing and modest diet.  RBC are microcytic.   Labs on 08/21/2015 revealed a ferritin was 44 with an iron saturation 4% and a TIBC of 395.  Ferritin was 116 on 09/18/2015.  He received 12 cycles of FOLFOX chemotherapy (08/14/2015 - 02/05/2016).  Chemotherapy was held on 10/23/2015 secondary to thrombocytopenia (87,000).  Platelet count was 90,000 prior to cycle #11.  He has tolerated his chemotherapy well without diarrhea, mouth  sores, or neuropathy.  Abdomen and pelvic CT on 10/05/2015 revealed a positive response to therapy with decreased size of the primary mass along the lesser curvature of the stomach, decrease extension of the mass into the gastrohepatic ligament, decreasing evidence of peritoneal carcinomatosis involving predominantly the omentum, and resolution of previously noted malignant ascites. There was a potential ulcer along the greater curvature of the mid body of the stomach.  Abdomen and pelvic CT scan on 12/24/2015 revealed further improvement in primary gastric malignancy and adjacent lymphadenopathy. There was no residual peritoneal carcinomatosis identified.  Abdominal CT scan on 03/21/2016 revealed new small volume ascites within the abdomen extending over the liver and spleen.  There was increase soft tissue stranding throughout the peritoneal cavity worrisome for recurrence of peritoneal carcinomatosis.  EGD on 02/26/2016 revealed one non-bleeding gastric ulcer.   The mass appeared smaller.  Biopsy revealed moderate chronic gastritis and edema, negative for H pylori, dysplasia and malignancy.  He is s/p 3 cycles of maintenance 5FU/LV (03/03/2016 - 04/08/2016).  Symptomatically, he has occasional abdominal pain.  Exam reveals urticaria.  He denies any exposures to new medications, herbal products, lotions, soaps, and detergents..  Plan: 1.  Labs today:  CBC with diff, CMP. 2.  Review CT scan 1 month ago.  Discuss persistent abdominal discomfort.  Discuss conversation at last tumor board and plan for reimaging.  At last  imaging, tissue only accessible with laparoscopic surgery.  However, if new imaging reveals clear progression, discuss initiation of second line therapy.  Patient is PDL-1 positive. Discuss treatment at progression with ramucirumab (Cyramza) + Taxol.  Response rate 8%, with disease control rate 49%.  Pembrolizumab for 3rd line therapy (response rates 22% + 8% stable disease). 3.  Discuss unexplained urticaria.  Discuss referral to dermatology. 4.  Postpone chemotherapy. 5.  Consult Dr. Ree Edman, dermatologist, today. 6.  Schedule abdomen and pelvis CT scan with contrast. [Change to non-contrast if dermatology recommends] 7.  RTC after CT scans.    Lequita Asal, MD  04/22/2016, 9:14 AM

## 2016-04-22 NOTE — Progress Notes (Signed)
Patient states the rash over his body has started up again on Sunday.  As long as he was on steroids he was okay.  Patient states he is not having abdominal pain but slight discomfort in his mid abdomen.  Patient states his legs have gotten weak when working and he became fatigued.

## 2016-04-24 ENCOUNTER — Ambulatory Visit
Admission: RE | Admit: 2016-04-24 | Discharge: 2016-04-24 | Disposition: A | Discharge status: Home / Self Care | Payer: Self-pay | Source: Ambulatory Visit | Attending: Hematology and Oncology | Admitting: Hematology and Oncology

## 2016-04-24 ENCOUNTER — Other Ambulatory Visit: Payer: Self-pay | Admitting: Hematology and Oncology

## 2016-04-24 DIAGNOSIS — R188 Other ascites: Secondary | ICD-10-CM | POA: Insufficient documentation

## 2016-04-24 DIAGNOSIS — C786 Secondary malignant neoplasm of retroperitoneum and peritoneum: Secondary | ICD-10-CM

## 2016-04-24 DIAGNOSIS — C162 Malignant neoplasm of body of stomach: Secondary | ICD-10-CM | POA: Insufficient documentation

## 2016-04-24 DIAGNOSIS — C801 Malignant (primary) neoplasm, unspecified: Secondary | ICD-10-CM

## 2016-04-25 ENCOUNTER — Other Ambulatory Visit: Payer: Self-pay | Admitting: Hematology and Oncology

## 2016-04-25 ENCOUNTER — Inpatient Hospital Stay (HOSPITAL_BASED_OUTPATIENT_CLINIC_OR_DEPARTMENT_OTHER): Payer: Self-pay | Admitting: Hematology and Oncology

## 2016-04-25 VITALS — BP 134/88 | HR 80 | Temp 98.2°F | Resp 18 | Wt 194.2 lb

## 2016-04-25 DIAGNOSIS — R21 Rash and other nonspecific skin eruption: Secondary | ICD-10-CM

## 2016-04-25 DIAGNOSIS — C162 Malignant neoplasm of body of stomach: Secondary | ICD-10-CM

## 2016-04-25 DIAGNOSIS — R1011 Right upper quadrant pain: Secondary | ICD-10-CM

## 2016-04-25 DIAGNOSIS — Z1509 Genetic susceptibility to other malignant neoplasm: Secondary | ICD-10-CM

## 2016-04-25 DIAGNOSIS — C786 Secondary malignant neoplasm of retroperitoneum and peritoneum: Secondary | ICD-10-CM

## 2016-04-25 DIAGNOSIS — R140 Abdominal distension (gaseous): Secondary | ICD-10-CM

## 2016-04-25 DIAGNOSIS — R188 Other ascites: Secondary | ICD-10-CM

## 2016-04-25 DIAGNOSIS — R1012 Left upper quadrant pain: Secondary | ICD-10-CM

## 2016-04-25 DIAGNOSIS — K2950 Unspecified chronic gastritis without bleeding: Secondary | ICD-10-CM

## 2016-04-25 DIAGNOSIS — K219 Gastro-esophageal reflux disease without esophagitis: Secondary | ICD-10-CM

## 2016-04-25 DIAGNOSIS — Z79899 Other long term (current) drug therapy: Secondary | ICD-10-CM

## 2016-04-25 DIAGNOSIS — D509 Iron deficiency anemia, unspecified: Secondary | ICD-10-CM

## 2016-04-25 DIAGNOSIS — R0602 Shortness of breath: Secondary | ICD-10-CM

## 2016-04-25 DIAGNOSIS — C801 Malignant (primary) neoplasm, unspecified: Secondary | ICD-10-CM

## 2016-04-25 NOTE — Patient Instructions (Signed)
Paclitaxel injection Qu es este medicamento? El PACLITAXEL es un agente quimioteraputico. Este medicamento acta sobre las clulas que se dividen rpidamente, como las clulas cancerosas, y finalmente provoca la muerte de estas clulas. Se utiliza en el tratamiento del cncer de [place], mama y otros tipos de cncer. Este medicamento puede ser utilizado para otros usos; si tiene alguna pregunta consulte con su proveedor de atencin mdica o con su farmacutico. MARCAS COMUNES: Onxol, Taxol Qu le debo informar a mi profesional de la salud antes de tomar este medicamento? Necesita saber si usted presenta alguno de los siguientes problemas o situaciones: -trastornos sanguneos -pulso cardiaco irregular -infeccin (especialmente infecciones virales, como varicela o herpes) -enfermedad heptica -radioterapia reciente o continua -una reaccin alrgica o inusual al paclitaxel, alcohol, aceite de ricino polioxietilado, otros agentes quimioteraputicos, medicamentos, alimentos, colorantes o conservantes -si est embarazada o buscando quedar embarazada -si est amamantando a un beb Cmo debo utilizar este medicamento? Este medicamento se administra como infusin en una vena. Un profesional de la salud especialmente capacitado lo administra en un hospital o clnica. Hable con su pediatra para informarse acerca del uso de este medicamento en nios. Puede requerir atencin especial. Sobredosis: Pngase en contacto inmediatamente con un centro toxicolgico o una sala de urgencia si usted cree que haya tomado demasiado medicamento. ATENCIN: [employer] es solo para usted. No comparta este medicamento con nadie. Qu sucede si me olvido de una dosis? Es importante no olvidar ninguna dosis. Informe a su mdico o a su profesional de la salud si no puede asistir a Photographer. Qu puede interactuar con este medicamento? No tome esta medicina con ninguno de los siguientes  medicamentos: -disulfiram -metronidazol Esta medicina tambin puede interactuar con los siguientes medicamentos: -ciclosporina -diazepam -quetoconazol -medicamentos para incrementar los conteos sanguneos, tales como filgrastim, pegfilgrastim, sargramostim -otros agentes quimioteraputicos, tales como cisplatino, doxorrubicina, epirubicina, etopsido, teniposida, vincristina -quinidina -testosterona -vacunas -verapamilo Consulte a su mdico o a su profesional de la salud antes de tomar cualquiera de los siguientes medicamentos: -acetaminofeno -aspirina -ibuprofeno -quetoprofeno -naproxeno Puede ser que esta lista no menciona todas las posibles interacciones. Informe a su profesional de [employer]	[place] de [employer] productos a base de hierbas, medicamentos de [place] o suplementos nutritivos que est tomando. Si usted fuma, consume bebidas alcohlicas o si utiliza drogas ilegales, indqueselo tambin a su profesional de [employer]	[place]. Algunas sustancias pueden interactuar con su medicamento. A qu debo estar atento al usar [employer]? Se supervisar su estado de salud atentamente mientras reciba este medicamento. Tendr que hacerse anlisis de sangre importantes mientras est tomando este medicamento. Este medicamento puede causar Chief of Staff graves. Para reducir su riesgo, necesitar tomar otro(s) medicamento(s) antes del tratamiento con este medicamento. Si tiene [employer] erupcin cutnea, comezn/picazn o urticaria, hinchazn del rostro, los labios, o la [place], informe de inmediato a su mdico o profesional de Technical sales engineer. En algunos casos, podra recibir [employer] para ayudarlo con los efectos secundarios. Siga todas las instrucciones para usarlos. Este medicamento podra hacerle sentir un Nurse, mental health. Esto es normal ya que la quimioterapia afecta tanto a las clulas sanas como a las clulas cancerosas. Si presenta algn efecto secundario,  infrmelo. Sin embargo, contine con el tratamiento aun si se siente enfermo, a menos que su mdico le indique que lo suspenda. Consulte a su mdico o a su profesional de la salud si tiene fiebre, escalofros o dolor de garganta, o cualquier otro sntoma de resfro o gripe. No se trate usted mismo. Este medicamento  reduce la capacidad del cuerpo para combatir infecciones. Trate de no acercarse a personas que estn enfermas. Este medicamento podra aumentar el riesgo de moretones o sangrado. Consulte a su mdico o a su profesional de la salud si observa sangrados inusuales. Proceda con cuidado al cepillar sus dientes, usar hilo dental o Risk manager palillos para los dientes, ya que podra contraer una infeccin o Therapist, art con mayor facilidad. Si se somete a algn tratamiento dental, informe a su dentista que est [employer]. Evite tomar productos que contienen aspirina, acetaminofeno, ibuprofeno, naproxeno o ketoprofeno, a menos que as lo indique su mdico. Estos productos pueden ocultar la fiebre. No debe quedar embarazada mientras reciba este medicamento. Las mujeres deben informar a su mdico si estn buscando quedar embarazadas o si creen que estn embarazadas. Existe la posibilidad de efectos secundarios graves en un beb sin nacer. Para obtener ms informacin, hable con su profesional de la salud o su farmacutico. No debe amamantar a un beb mientras est tomando este medicamento. Para los hombres, se desaconseja tener nios mientras reciben [employer]. Este producto podra contener alcohol. Pregunte a Midwife o a su proveedor de atencin de la salud si este medicamento contiene alcohol. Asegrese de decirles a todos los proveedores de atencin de la salud que usted est tomando [place]. Ciertos medicamentos, como metronidazol y disulfiram, pueden causar una reaccin desagradable cuando se toman con alcohol. Esta reaccin incluye enrojecimiento, dolor de cabeza,  nuseas, vmitos, sudoracin y aumento de la sed. La reaccin puede durar de 30 minutos a varias horas. Qu efectos secundarios puedo tener al [employer] este medicamento? Efectos secundarios que debe informar a su mdico o a Barrister's clerk de la salud tan pronto como sea posible: Chief of Staff, como erupcin cutnea, picazn o urticarias, hinchazn de la cara, los labios o la lengua conteos sanguneos bajos - este frmaco puede reducir la cantidad de glbulos blancos, glbulos rojos y plaquetas. Su riesgo de infeccin y [place]. signos de infeccin - fiebre o escalofros, tos, dolor de garganta, dolor o dificultad para orinar signos de reduccin de plaquetas o sangrado - magulladuras, puntos rojos en la piel, heces de color oscuro o con aspecto alquitranado, sangrado por la nariz signos de reduccin de glbulos rojos - cansancio o debilidad inusual, desmayos, sensacin de mareo problemas respiratorios dolor en el pecho alta o baja presin sangunea llagas en la boca nuseas y vmito dolor, hinchazn, enrojecimiento o irritacin en el lugar de la [place], hormigueo, entumecimiento de las manos o los pies pulso cardiaco lento o irregular hinchazn de tobillos, pies, manos Efectos secundarios que, por lo general, no requieren atencin mdica (debe informarlos a su mdico o a su profesional de la salud si persisten o si son molestos): dolor de huesos cada total del cabello, incluyendo el cabello de la cabeza, de las [place], el vello pbico, las cejas y las pestaas cambios en el color de las uas de las manos diarrea aflojamiento de las uas de las manos prdida del apetito dolores musculares o articulares enrojecimiento de la piel sudoracin Puede ser que esta lista no menciona todos los posibles efectos secundarios. Comunquese a su mdico por asesoramiento mdico [employer] Usted puede informar los efectos secundarios a la FDA por telfono al  1-800-FDA-1088. Dnde debo guardar mi medicina? Este medicamento se administra en hospitales o clnicas y no necesitar guardarlo en su domicilio. ATENCIN: Este folleto es un resumen. Puede ser que no cubra toda la posible informacin. Si usted  tiene preguntas acerca de esta medicina, consulte con su mdico, su farmacutico o su profesional de Technical sales engineer.  2018 Elsevier/Gold Standard (2016-02-21 00:00:00) Ramucirumab injection Qu es este medicamento? El RAMUCIRUMAB es un anticuerpo monoclonal. [place] para tratar el cncer de estmago, cncer colorrectal o cncer de pulmn. Este medicamento puede ser utilizado para otros usos; si tiene alguna pregunta consulte con su proveedor de atencin mdica o con su farmacutico. MARCAS COMUNES: [employer] Qu le debo informar a mi profesional de la salud antes de tomar este medicamento? Necesita saber si usted presenta alguno de los siguientes problemas o situaciones: trastornos sanguneos cogulos sanguneos enfermedad cardiaca, incluyendo insuficiencia cardiaca, ataque cardiaco o dolor en el pecho (angina) alta presin sangunea infeccin (especialmente infecciones virales, como varicela o herpes) protena en la orina ciruga reciente derrame cerebral una reaccin alrgica o inusual al ramucirumab, a otros medicamentos, alimentos, colorantes o conservantes si est embarazada o buscando quedar embarazada si est amamantando a un beb Cmo debo utilizar este medicamento? Este medicamento se administra mediante infusin por va intravenosa. Lo administra un profesional de Technical sales engineer en un hospital o en un entorno clnico. Hable con su pediatra para informarse acerca del uso de este medicamento en nios. Puede requerir atencin especial. Sobredosis: Pngase en contacto inmediatamente con un centro toxicolgico o una sala de urgencia si usted cree que haya tomado demasiado medicamento. ATENCIN: [employer] es solo para usted. No comparta este medicamento con  nadie. Qu sucede si me olvido de una dosis? Es importante no olvidar ninguna dosis. Informe a su mdico o a su profesional de la salud si no puede asistir a Photographer. Qu puede interactuar con este medicamento? No se han estudiado las interacciones. Puede ser que esta lista no menciona todas las posibles interacciones. Informe a su profesional de [employer]	[place] de [employer] productos a base de hierbas, medicamentos de [place] o suplementos nutritivos que est tomando. Si usted fuma, consume bebidas alcohlicas o si utiliza drogas ilegales, indqueselo tambin a su profesional de [employer]	[place]. Algunas sustancias pueden interactuar con su medicamento. A qu debo estar atento al usar Coca[employer]Cola? Se supervisar su condicin atentamente mientras reciba este medicamento. Tendr que revisar su presin sangunea y [employer] de [place] y [place] mientras est tomando este medicamento. Se supervisar su estado de salud atentamente mientras reciba este medicamento. [employer] puede aumentar el riesgo de magulladuras o sangrado. Consulte a su mdico o a su profesional de la salud si observa sangrados inusuales. Este medicamento puede causar raramente 'perforacin gastrointestinal' (agujeros en el estmago, intestinos o colon), un efecto secundario severo que requieren ciruga para Psychiatric nurse. Este Biomedical engineer por lo menos 28 das despus de la [place] mayor y TEFL teacher de la ciruga debe ser totalmente curado. Consulte a su mdico antes de programar una ciruga o tratamiento dental mientras que est recibiendo [employer]. Hable con su mdico si usted ha tenido [place] recientemente o si tiene una herida que no ha curado. No se debe quedar embarazada mientras est tomando este medicamento o durante 3 meses despus de dejar de tomarlo. Las mujeres deben informar a su mdico si estn buscando quedar embarazadas o si creen que estn embarazadas. Existe la posibilidad de efectos  secundarios graves a un beb sin nacer. Para ms informacin hable con su profesional de la salud o su farmacutico. Qu efectos secundarios puedo tener al utilizar este medicamento? Efectos secundarios que debe informar a su mdico o a Barrister's clerk de la salud tan  pronto como sea posible: Chief of Staff, como erupcin cutnea, picazn o urticarias, problemas respiratorios, hinchazn de la cara, los labios o la lengua signos de infeccin: fiebre o escalofros, tos, dolor de Press photographer u opresin en el pecho confusin mareos sensacin de Youth worker o aturdimiento, cadas dolor abdominal grave nuseas, vmito graves signos y sntomas de Teacher, music, tales como heces con sangre o de color negro y Curator alquitranado; [place] de color rojo o marrn oscuro; escupir sangre o material marrn que tiene el aspecto de granos de caf molido; Tree surgeon rojas en la piel; sangrado o moretones inusuales en los ojos, las encas o la nariz signos y sntomas de un cogulo sanguneo, tales como problemas respiratorios; cambios en la visin; dolor en el pecho; dolor de cabeza severo, repentino; dolor, hinchazn, calor en la pierna; dificultad para hablar; entumecimiento o debilidad repentina de la cara, el brazo o la pierna sntomas de un derrame cerebral: cambio en la claridad mental, incapacidad para hablar o mover un lado del cuerpo dificultad para caminar, mareos, prdida del equilibrio o la coordinacin Efectos secundarios que generalmente no requieren Geophysical data processor (infrmelos a su mdico o a Barrister's clerk de la salud si persisten o si son molestos): piel fra y sudorosa estreimiento diarrea dolor de cabeza nuseas, vmito dolor estomacal ritmo cardiaco inusualmente lento cansancio o debilidad inusual Puede ser que esta lista no menciona todos los posibles efectos secundarios. Comunquese a su mdico por asesoramiento mdico [employer] Usted puede informar los efectos secundarios a la FDA por  telfono al 1-800-FDA-1088. Dnde debo guardar mi medicina? Este medicamento se administra en hospitales o clnicas y no necesitar guardarlo en su domicilio. ATENCIN: Este folleto es un resumen. Puede ser que no cubra toda la posible informacin. Si usted tiene preguntas acerca de esta medicina, consulte con su mdico, su farmacutico o su profesional de Technical sales engineer.  2018 Elsevier/Gold Standard (2016-02-21 00:00:00)

## 2016-04-25 NOTE — Progress Notes (Signed)
Patient states on Wednesday he developed a cough.  Better now.  Saw the dermatologist.  Placed on medications.  Does not remember the names.  Patient instructed to bring them with him at next visit.  Patient here today re CT results.

## 2016-04-25 NOTE — Progress Notes (Signed)
[facility] day:  04/25/2016  Chief Complaint: Anthony Melendez is a 36 y.o. male with metastatic gastric cancer who is seen for review of interval CT scan and discussion regarding direction of therapy.  HPI:  The patient was last seen in the medical oncology clinic by me on 04/22/2016.  At that time, he had persistent abdominal discomfort.  Exam revealed urticaria.  He denies any exposures to new medications, herbal products, lotions, soaps, and detergents.  Decision was made to postpone chemotherapy and perform follow-up CT scans.    He was referred to dermatology for his urticaria.  He was seen by Dr. Ree Edman on 04/22/2016.  He was felt to have urticaria of unclear etiology.  He was instructed to take Zyrtec and Xyzal.  Note is currently unavailable.  Abdomen and pelvic CT scan on 04/24/2016 revealed evidence of progressive intraperitoneal metastatic disease with increased ascites and omental caking.  Symptomatically, he denies any new complaints.   Past Medical History:  Diagnosis Date  . Gastric cancer ([place]) 08/2015   Folfox chemo tx's.  Marland Kitchen GERD (gastroesophageal reflux disease)     Past Surgical History:  Procedure Laterality Date  . ESOPHAGOGASTRODUODENOSCOPY (EGD) WITH PROPOFOL N/A 08/02/2015   Procedure: ESOPHAGOGASTRODUODENOSCOPY (EGD) WITH PROPOFOL;  Surgeon: Lucilla Lame, MD;  Location: [facility];  Service: Endoscopy;  Laterality: N/A;  . ESOPHAGOGASTRODUODENOSCOPY (EGD) WITH PROPOFOL N/A 02/26/2016   Procedure: ESOPHAGOGASTRODUODENOSCOPY (EGD) WITH PROPOFOL;  Surgeon: Lucilla Lame, MD;  Location: [facility];  Service: Endoscopy;  Laterality: N/A;  . NO PAST SURGERIES    . PERIPHERAL VASCULAR CATHETERIZATION N/A 08/13/2015   Procedure: Glori Luis Cath Insertion;  Surgeon: Algernon Huxley, MD;  Location: [facility];  Service: Cardiovascular;  Laterality: N/A;    No family history on file.  Social History:   reports that he has never smoked. He has never used smokeless tobacco. He reports that he does not drink alcohol or use drugs.  The patient is from [place].  He speaks [place].  He moves to the [place] in 2000.  He lives in [place] with his mother, father, brother and sister.  He is a Theme park manager.  The patient is accompanied by his father, sister, and the interpreter today.   Allergies:  Allergies  Allergen Reactions  . Iodine     Possible anaphylaxis per MD     Current Medications: Current Outpatient Prescriptions  Medication Sig Dispense Refill  . ferrous sulfate 325 (65 FE) MG EC tablet Take 1 tablet (325 mg total) by mouth daily with breakfast. 30 tablet 3  . HYDROcodone-acetaminophen (NORCO) 5-325 MG tablet Take 1 tablet by mouth every 6 (six) hours as needed for moderate pain. 20 tablet 0  . levocetirizine (XYZAL) 5 MG tablet Take 5 mg by mouth every evening.    Marland Kitchen omeprazole (PRILOSEC) 20 MG capsule Take 1 capsule (20 mg total) by mouth 2 (two) times daily before a meal. 60 capsule 2  . potassium chloride (K-DUR) 10 MEQ tablet Take 1 tablet daily 30 tablet 0  . prochlorperazine (COMPAZINE) 10 MG tablet Take 1 tablet (10 mg total) by mouth every 6 (six) hours as needed for nausea or vomiting. 30 tablet 1  . hydrOXYzine (ATARAX/VISTARIL) 25 MG tablet Take 25 mg by mouth daily.    . ondansetron (ZOFRAN) 8 MG tablet Take 1 tablet (8 mg total) by mouth 2 (two) times daily as needed (Nausea or vomiting). 30 tablet 1   No  current facility-administered medications for this visit.    Facility-Administered Medications Ordered in Other Visits  Medication Dose Route Frequency Provider Last Rate Last Dose  . heparin lock flush 100 unit/mL  500 Units Intravenous Once Lequita Asal, MD      . pegfilgrastim (NEULASTA) injection 6 mg  6 mg Subcutaneous Once Lequita Asal, MD      . sodium chloride 0.9 % injection 10 mL  10 mL Intravenous Once Lequita Asal, MD      . sodium  chloride flush (NS) 0.9 % injection 10 mL  10 mL Intravenous PRN Lequita Asal, MD   10 mL at 08/28/15 0569    Review of Systems:  GENERAL:  Feels "fine".  No fevers or sweats.  Weight down 1 pound. PERFORMANCE STATUS (ECOG):  1 HEENT:  No visual changes, runny nose, sore throat, mouth sores or tenderness. Lungs:  No shortness of breath or cough.  No hemoptysis. Cardiac:  No chest pain, palpitations, orthopnea, or PND. GI:   Intermittent abdominal pain.  No nausea, vomiting, diarrhea, constipation, melena or hematochezia. GU:  No urgency, frequency, dysuria, or hematuria. Musculoskeletal:  No bone pain.  No muscle tenderness. Extremities:  No pain or swelling. Skin:  Pruritic body rash (see HPI). Neuro:  No headache, numbness or weakness, balance or coordination issues. Endocrine:  No diabetes, thyroid issues, hot flashes or night sweats. Psych:  No mood changes, depression or anxiety. Pain: No pain. Review of systems:  All other systems reviewed and found to be negative.  Physical Exam: Blood pressure 134/88, pulse 80, temperature 98.2 F (36.8 C), temperature source Tympanic, resp. rate 18, weight 194 lb 3 oz (88.1 kg). GENERAL:  Well developed, well nourished, gentleman sitting comfortably in the exam room in no acute distress. MENTAL STATUS:  Alert and oriented to person, place and time. HEAD:  Wearing a cap.  Black hair.  Thin mustache.  Normocephalic, atraumatic, face symmetric, no Cushingoid features. EYES:  Brown eyes.  No conjunctivitis or scleral icterus. NEUROLOGICAL: Unremarkable. PSYCH:  Appropriate.    No visits with results within 3 Day(s) from this visit.  Latest known visit with results is:  Infusion on 04/22/2016  Component Date Value Ref Range Status  . WBC 04/22/2016 5.3  3.8 - 10.6 K/uL Final  . RBC 04/22/2016 5.08  4.40 - 5.90 MIL/uL Final  . Hemoglobin 04/22/2016 15.8  13.0 - 18.0 g/dL Final  . HCT 04/22/2016 44.9  40.0 - 52.0 % Final  . MCV  04/22/2016 88.3  80.0 - 100.0 fL Final  . MCH 04/22/2016 31.0  26.0 - 34.0 pg Final  . MCHC 04/22/2016 35.1  32.0 - 36.0 g/dL Final  . RDW 04/22/2016 13.4  11.5 - 14.5 % Final  . Platelets 04/22/2016 108* 150 - 440 K/uL Final  . Neutrophils Relative % 04/22/2016 67  % Final  . Neutro Abs 04/22/2016 3.5  1.4 - 6.5 K/uL Final  . Lymphocytes Relative 04/22/2016 22  % Final  . Lymphs Abs 04/22/2016 1.2  1.0 - 3.6 K/uL Final  . Monocytes Relative 04/22/2016 9  % Final  . Monocytes Absolute 04/22/2016 0.5  0.2 - 1.0 K/uL Final  . Eosinophils Relative 04/22/2016 1  % Final  . Eosinophils Absolute 04/22/2016 0.0  0 - 0.7 K/uL Final  . Basophils Relative 04/22/2016 1  % Final  . Basophils Absolute 04/22/2016 0.0  0 - 0.1 K/uL Final  . Sodium 04/22/2016 134* 135 - 145 mmol/L Final  .  Potassium 04/22/2016 3.7  3.5 - 5.1 mmol/L Final  . Chloride 04/22/2016 102  101 - 111 mmol/L Final  . CO2 04/22/2016 27  22 - 32 mmol/L Final  . Glucose, Bld 04/22/2016 222* 65 - 99 mg/dL Final  . BUN 04/22/2016 14  6 - 20 mg/dL Final  . Creatinine, Ser 04/22/2016 0.66  0.61 - 1.24 mg/dL Final  . Calcium 04/22/2016 8.7* 8.9 - 10.3 mg/dL Final  . Total Protein 04/22/2016 7.3  6.5 - 8.1 g/dL Final  . Albumin 04/22/2016 4.0  3.5 - 5.0 g/dL Final  . AST 04/22/2016 35  15 - 41 U/L Final  . ALT 04/22/2016 27  17 - 63 U/L Final  . Alkaline Phosphatase 04/22/2016 77  38 - 126 U/L Final  . Total Bilirubin 04/22/2016 0.6  0.3 - 1.2 mg/dL Final  . GFR calc non Af Amer 04/22/2016 >60  >60 mL/min Final  . GFR calc Af Amer 04/22/2016 >60  >60 mL/min Final   Comment: (NOTE) The eGFR has been calculated using the CKD EPI equation. This calculation has not been validated in all clinical situations. eGFR's persistently <60 mL/min signify possible Chronic Kidney Disease.   . Anion gap 04/22/2016 5  5 - 15 Final    Assessment:  Sewell Pitner is a 36 y.o. male with metastatic gastric cancer.  He presented with a 4-6 week  history of progressive epigastric pain.  EGD on 08/02/2015 revealed a large, ulcerated, non-circumferential mass with oozing and stigmata of recent bleeding in the entire examined stomach.  The mass involved the fundus down to the antrum.  Biopsies revealed poorly differentiated adenocarcinoma.  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  Her2/neu testing was negative.  Microsatellite testing was negative.  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  PDL-1 was expressed.  CEA was 0.3 on 07/30/2015.  Chest, abdomen and pelvic CT scan on 07/29/2015 revealed a 4 cm thickened and irregular gastric wall compatible with an infiltrative malignancy. There was extensive involvement of the upper mesentery and omentum compatible with peritoneal carcinomatosis. There was a small amount of ascites.   Head CT on 08/08/2015 revealed no evidence of metastatic disease.  He completed treatment for H pylori  (began 08/09/2015).  Stool was negative for H pylori on 11/01/2015.  He has iron deficiency anemia likely gastric oozing and modest diet.  RBC are microcytic.  Labs on 08/21/2015 revealed a ferritin was 44 with an iron saturation 4% and a TIBC of 395.  Ferritin was 116 on 09/18/2015.  Abdomen and pelvic CT on 10/05/2015 revealed a positive response to therapy with decreased size of the primary mass along the lesser curvature of the stomach, decrease extension of the mass into the gastrohepatic ligament, decreasing evidence of peritoneal carcinomatosis involving predominantly the omentum, and resolution of previously noted malignant ascites. There was a potential ulcer along the greater curvature of the mid body of the stomach.  Abdomen and pelvic CT scan on 12/24/2015 revealed further improvement in primary gastric malignancy and adjacent  lymphadenopathy. There was no residual peritoneal carcinomatosis identified.  Abdominal CT on 03/21/2016 revealed new small volume ascites within the abdomen extending over the liver and spleen.  There was increase soft tissue stranding throughout the peritoneal cavity worrisome for recurrence of peritoneal carcinomatosis.  Abdomen and pelvic CT on 04/24/2016 revealed evidence of progressive intraperitoneal metastatic disease with increased ascites  and omental caking.  EGD on 02/26/2016 revealed one non-bleeding gastric ulcer.   The mass appeared smaller.  Biopsy revealed moderate chronic gastritis and edema, negative for H pylori, dysplasia and malignancy.  He received 12 cycles of FOLFOX chemotherapy (08/14/2015 - 02/05/2016).  Chemotherapy was held on 10/23/2015 secondary to thrombocytopenia (87,000).  Platelet count was 90,000 prior to cycle #11.  He has tolerated his chemotherapy well without diarrhea, mouth sores, or neuropathy.  He received 3 cycles of maintenance 5FU/LV (03/03/2016 - 04/08/2016).  Symptomatically, he has occasional abdominal pain.  Exam reveals urticaria.  Plan: 1.  Review CT scan.  Discuss progressive disease.  Discuss discontinuation of current chemotherapy.  Discuss second line therapy of ramucirumab (Cyramza) + Taxol.  Side effects reviewed in detail.  Information provided.  Re-reviewed that treatment is palliative.  Multiple questions asked and answered. 2.  Discuss consult with dermatology.  Etiology of urticaria is unknown.  Continue Zyrtec and Xyzal. 3.  Preauth ramucirumab (Cyramza) + Taxol 4.  RTC next week for MD assessment, labs (CBC with diff, CMP, Mg) and day 1 of cycle #1 ramucirumab (Cyramza) + Taxol    Lequita Asal, MD  04/25/2016

## 2016-04-25 NOTE — Progress Notes (Signed)
START ON PATHWAY REGIMEN - Gastroesophageal     A cycle is every 28 days:     Ramucirumab      Paclitaxel   **Always confirm dose/schedule in your pharmacy ordering system**    Patient Characteristics: Distant Metastases (cM1/pM1) / Locally Recurrent Disease, Adenocarcinoma - Esophageal, GE Junction, and Gastric, Second Line, MSS / pMMR or MSI Unknown Histology: Adenocarcinoma Disease Classification: Gastric Therapeutic Status: Distant Metastases (No Additional Staging) Would you be surprised if this patient died  in the next year? I would be surprised if this patient died in the next year Line of therapy: Second Line Microsatellite/Mismatch Repair Status: MSS/pMMR  Intent of Therapy: Non-Curative / Palliative Intent, Discussed with Patient

## 2016-04-28 ENCOUNTER — Other Ambulatory Visit: Payer: Self-pay

## 2016-04-28 ENCOUNTER — Ambulatory Visit: Payer: Self-pay | Admitting: Hematology and Oncology

## 2016-04-28 ENCOUNTER — Ambulatory Visit: Payer: Self-pay

## 2016-05-01 ENCOUNTER — Inpatient Hospital Stay (HOSPITAL_BASED_OUTPATIENT_CLINIC_OR_DEPARTMENT_OTHER): Payer: Self-pay | Admitting: Hematology and Oncology

## 2016-05-01 ENCOUNTER — Inpatient Hospital Stay: Payer: Self-pay

## 2016-05-01 ENCOUNTER — Other Ambulatory Visit: Payer: Self-pay | Admitting: Hematology and Oncology

## 2016-05-01 ENCOUNTER — Encounter: Payer: Self-pay | Admitting: Hematology and Oncology

## 2016-05-01 VITALS — BP 103/70 | HR 82 | Temp 97.1°F | Resp 20

## 2016-05-01 VITALS — BP 112/76 | HR 71 | Temp 97.6°F | Resp 18 | Wt 195.2 lb

## 2016-05-01 DIAGNOSIS — R0602 Shortness of breath: Secondary | ICD-10-CM

## 2016-05-01 DIAGNOSIS — R1012 Left upper quadrant pain: Secondary | ICD-10-CM

## 2016-05-01 DIAGNOSIS — Z7189 Other specified counseling: Secondary | ICD-10-CM

## 2016-05-01 DIAGNOSIS — K219 Gastro-esophageal reflux disease without esophagitis: Secondary | ICD-10-CM

## 2016-05-01 DIAGNOSIS — R188 Other ascites: Secondary | ICD-10-CM

## 2016-05-01 DIAGNOSIS — R21 Rash and other nonspecific skin eruption: Secondary | ICD-10-CM

## 2016-05-01 DIAGNOSIS — Z1509 Genetic susceptibility to other malignant neoplasm: Secondary | ICD-10-CM

## 2016-05-01 DIAGNOSIS — C786 Secondary malignant neoplasm of retroperitoneum and peritoneum: Secondary | ICD-10-CM

## 2016-05-01 DIAGNOSIS — D509 Iron deficiency anemia, unspecified: Secondary | ICD-10-CM

## 2016-05-01 DIAGNOSIS — L509 Urticaria, unspecified: Secondary | ICD-10-CM

## 2016-05-01 DIAGNOSIS — C162 Malignant neoplasm of body of stomach: Secondary | ICD-10-CM

## 2016-05-01 DIAGNOSIS — Z79899 Other long term (current) drug therapy: Secondary | ICD-10-CM

## 2016-05-01 DIAGNOSIS — R140 Abdominal distension (gaseous): Secondary | ICD-10-CM

## 2016-05-01 DIAGNOSIS — C801 Malignant (primary) neoplasm, unspecified: Secondary | ICD-10-CM

## 2016-05-01 DIAGNOSIS — R1011 Right upper quadrant pain: Secondary | ICD-10-CM

## 2016-05-01 DIAGNOSIS — K2950 Unspecified chronic gastritis without bleeding: Secondary | ICD-10-CM

## 2016-05-01 LAB — CBC WITH DIFFERENTIAL/PLATELET
Basophils Absolute: 0 10*3/uL (ref 0–0.1)
Basophils Relative: 1 %
Eosinophils Absolute: 0.1 10*3/uL (ref 0–0.7)
Eosinophils Relative: 2 %
HCT: 44.6 % (ref 40.0–52.0)
Hemoglobin: 15.7 g/dL (ref 13.0–18.0)
Lymphocytes Relative: 29 %
Lymphs Abs: 1.3 10*3/uL (ref 1.0–3.6)
MCH: 30.8 pg (ref 26.0–34.0)
MCHC: 35.2 g/dL (ref 32.0–36.0)
MCV: 87.5 fL (ref 80.0–100.0)
Monocytes Absolute: 0.5 10*3/uL (ref 0.2–1.0)
Monocytes Relative: 12 %
Neutro Abs: 2.6 10*3/uL (ref 1.4–6.5)
Neutrophils Relative %: 56 %
Platelets: 135 10*3/uL — ABNORMAL LOW (ref 150–440)
RBC: 5.1 MIL/uL (ref 4.40–5.90)
RDW: 13.4 % (ref 11.5–14.5)
WBC: 4.5 10*3/uL (ref 3.8–10.6)

## 2016-05-01 LAB — COMPREHENSIVE METABOLIC PANEL
ALT: 24 U/L (ref 17–63)
AST: 30 U/L (ref 15–41)
Albumin: 4 g/dL (ref 3.5–5.0)
Alkaline Phosphatase: 80 U/L (ref 38–126)
Anion gap: 7 (ref 5–15)
BUN: 12 mg/dL (ref 6–20)
CO2: 24 mmol/L (ref 22–32)
Calcium: 8.7 mg/dL — ABNORMAL LOW (ref 8.9–10.3)
Chloride: 106 mmol/L (ref 101–111)
Creatinine, Ser: 0.74 mg/dL (ref 0.61–1.24)
GFR calc Af Amer: >60 mL/min (ref >60)
GFR calc non Af Amer: >60 mL/min (ref >60)
Glucose, Bld: 122 mg/dL — ABNORMAL HIGH (ref 65–99)
Potassium: 3.4 mmol/L — ABNORMAL LOW (ref 3.5–5.1)
Sodium: 137 mmol/L (ref 135–145)
Total Bilirubin: 0.9 mg/dL (ref 0.3–1.2)
Total Protein: 7.4 g/dL (ref 6.5–8.1)

## 2016-05-01 LAB — PROTEIN, URINE, RANDOM: Total Protein, Urine: 56 mg/dL

## 2016-05-01 LAB — MAGNESIUM: Magnesium: 2.1 mg/dL (ref 1.7–2.4)

## 2016-05-01 MED ORDER — ONDANSETRON HCL 8 MG PO TABS: 8.0000 mg | ORAL_TABLET | Freq: Two times a day (BID) | ORAL | 1 refills | Status: DC | PRN

## 2016-05-01 MED ORDER — SODIUM CHLORIDE 0.9 % IV SOLN
Freq: Once | INTRAVENOUS | Status: AC
  Administered 2016-05-01: 10:00:00 via INTRAVENOUS
  Filled 2016-05-01: qty 1000

## 2016-05-01 MED ORDER — SODIUM CHLORIDE 0.9% FLUSH
10.0000 mL | INTRAVENOUS | Status: DC | PRN
Start: 2016-05-01 — End: 2016-05-01
  Administered 2016-05-01: 10 mL
  Filled 2016-05-01: qty 10

## 2016-05-01 MED ORDER — DIPHENHYDRAMINE HCL 50 MG/ML IJ SOLN
50.0000 mg | Freq: Once | INTRAMUSCULAR | Status: AC
  Administered 2016-05-01: 50 mg via INTRAVENOUS
  Filled 2016-05-01: qty 1

## 2016-05-01 MED ORDER — ACETAMINOPHEN 325 MG PO TABS
650.0000 mg | ORAL_TABLET | Freq: Once | ORAL | Status: AC
  Administered 2016-05-01: 650 mg via ORAL
  Filled 2016-05-01: qty 2

## 2016-05-01 MED ORDER — SODIUM CHLORIDE 0.9 % IV SOLN
80.0000 mg/m2 | Freq: Once | INTRAVENOUS | Status: AC
  Administered 2016-05-01: 162 mg via INTRAVENOUS
  Filled 2016-05-01: qty 27

## 2016-05-01 MED ORDER — DEXAMETHASONE SODIUM PHOSPHATE 10 MG/ML IJ SOLN
10.0000 mg | Freq: Once | INTRAMUSCULAR | Status: AC
  Administered 2016-05-01: 10 mg via INTRAVENOUS
  Filled 2016-05-01: qty 1

## 2016-05-01 MED ORDER — FAMOTIDINE IN NACL 20-0.9 MG/50ML-% IV SOLN
20.0000 mg | Freq: Once | INTRAVENOUS | Status: AC
  Administered 2016-05-01: 20 mg via INTRAVENOUS
  Filled 2016-05-01: qty 50

## 2016-05-01 MED ORDER — SODIUM CHLORIDE 0.9 % IV SOLN: 10.0000 mg | Freq: Once | INTRAVENOUS | Status: DC

## 2016-05-01 MED ORDER — HEPARIN SOD (PORK) LOCK FLUSH 100 UNIT/ML IV SOLN
500.0000 [IU] | Freq: Once | INTRAVENOUS | Status: AC | PRN
  Administered 2016-05-01: 500 [IU]
  Filled 2016-05-01: qty 5

## 2016-05-01 MED ORDER — HYDROCORTISONE NA SUCCINATE PF 100 MG IJ SOLR
100.0000 mg | Freq: Once | INTRAMUSCULAR | Status: AC
  Administered 2016-05-01: 100 mg via INTRAVENOUS

## 2016-05-01 MED ORDER — DIPHENHYDRAMINE HCL 50 MG/ML IJ SOLN
25.0000 mg | Freq: Once | INTRAMUSCULAR | Status: AC | PRN
  Administered 2016-05-01: 25 mg via INTRAVENOUS

## 2016-05-01 MED ORDER — SODIUM CHLORIDE 0.9 % IV SOLN
8.0000 mg/kg | Freq: Once | INTRAVENOUS | Status: AC
  Administered 2016-05-01: 700 mg via INTRAVENOUS
  Filled 2016-05-01: qty 50

## 2016-05-01 NOTE — Progress Notes (Signed)
Patient states he has pain in his left upper abdomen when he tries to move around in his bed.  Has some pain on right side but worse on left side.  Also states he feels full and does not always eat so much.  Further c/o indigestion.

## 2016-05-01 NOTE — Progress Notes (Signed)
[facility] day:  05/01/16  Chief Complaint: Anthony Melendez is a 36 y.o. male with metastatic gastric cancer who is seen for assessment prior to day 1 of cycle #1 ramucirumab (Cyramza) + Taxol.  HPI:  The patient was last seen in the medical oncology clinic by me on 04/24/2016.  At that time, scans revealed progressive intraperitoneal metastatic disease with increased ascites and omental caking.  We discussed a change in therapy to ramucirumab + Taxol.  Side effects were reviewed.  In addition, we discussed his follow-up with dermatology for urticaria.  He was seen by Dr. Ree Edman on 04/22/2016.  He was felt to have urticaria of unclear etiology.  He was instructed to take Zyrtec every morning and hydroxyzine at night.  He was to take Xyzal 5 mg daily.  He was to buy triamcinolone cerave and apply to body from neck down daily.  He was instructed to use mild soaps and not to use perfumes.  He was felt to be able to continue chemotherapy.  Dr. Aubery Lapping did not think the urticaria was caused by CT contrast.  During the interim, his hives have almost cleared.  He feels good.  He has abdominal discomfort under his ribs (left > right) and in the left lower quadrant.  He feels full/bloated.  He feels like he is carrying a heavy back pack.   Past Medical History:  Diagnosis Date  . Gastric cancer ([place]) 08/2015   Folfox chemo tx's.  Marland Kitchen GERD (gastroesophageal reflux disease)     Past Surgical History:  Procedure Laterality Date  . ESOPHAGOGASTRODUODENOSCOPY (EGD) WITH PROPOFOL N/A 08/02/2015   Procedure: ESOPHAGOGASTRODUODENOSCOPY (EGD) WITH PROPOFOL;  Surgeon: Lucilla Lame, MD;  Location: [facility];  Service: Endoscopy;  Laterality: N/A;  . ESOPHAGOGASTRODUODENOSCOPY (EGD) WITH PROPOFOL N/A 02/26/2016   Procedure: ESOPHAGOGASTRODUODENOSCOPY (EGD) WITH PROPOFOL;  Surgeon: Lucilla Lame, MD;  Location: [facility];  Service:  Endoscopy;  Laterality: N/A;  . NO PAST SURGERIES    . PERIPHERAL VASCULAR CATHETERIZATION N/A 08/13/2015   Procedure: Glori Luis Cath Insertion;  Surgeon: Algernon Huxley, MD;  Location: [facility];  Service: Cardiovascular;  Laterality: N/A;    History reviewed. No pertinent family history.  Social History:  reports that he has never smoked. He has never used smokeless tobacco. He reports that he does not drink alcohol or use drugs.  The patient is from [place].  He speaks [place].  He moves to the [place] in 2000.  He lives in [place] with his mother, father, brother and sister.  He is a Theme park manager.  The patient is accompanied by his mother, brother, and the interpreter today.   Allergies:  Allergies  Allergen Reactions  . Iodine     Possible anaphylaxis per MD     Current Medications: Current Outpatient Prescriptions  Medication Sig Dispense Refill  . ferrous sulfate 325 (65 FE) MG EC tablet Take 1 tablet (325 mg total) by mouth daily with breakfast. 30 tablet 3  . HYDROcodone-acetaminophen (NORCO) 5-325 MG tablet Take 1 tablet by mouth every 6 (six) hours as needed for moderate pain. 20 tablet 0  . hydrOXYzine (ATARAX/VISTARIL) 25 MG tablet Take 25 mg by mouth daily.    Marland Kitchen levocetirizine (XYZAL) 5 MG tablet Take 5 mg by mouth every evening.    Marland Kitchen omeprazole (PRILOSEC) 20 MG capsule Take 1 capsule (20 mg total) by mouth 2 (two) times daily before a meal. 60 capsule 2  .  potassium chloride (K-DUR) 10 MEQ tablet Take 1 tablet daily 30 tablet 0  . prochlorperazine (COMPAZINE) 10 MG tablet Take 1 tablet (10 mg total) by mouth every 6 (six) hours as needed for nausea or vomiting. 30 tablet 1   No current facility-administered medications for this visit.    Facility-Administered Medications Ordered in Other Visits  Medication Dose Route Frequency Provider Last Rate Last Dose  . heparin lock flush 100 unit/mL  500 Units Intravenous Once Lequita Asal, MD      . pegfilgrastim  (NEULASTA) injection 6 mg  6 mg Subcutaneous Once Lequita Asal, MD      . sodium chloride 0.9 % injection 10 mL  10 mL Intravenous Once Lequita Asal, MD      . sodium chloride flush (NS) 0.9 % injection 10 mL  10 mL Intravenous PRN Lequita Asal, MD   10 mL at 08/28/15 7017    Review of Systems:  GENERAL:  Feels "good".  No fevers or sweats.  Weight up 1 pound. PERFORMANCE STATUS (ECOG):  1 HEENT:  No visual changes, runny nose, sore throat, mouth sores or tenderness. Lungs:  No shortness of breath or cough.  No hemoptysis. Cardiac:  No chest pain, palpitations, orthopnea, or PND. GI:    Mild abdominal pain (see HPI).  Feels bloated.  No nausea, vomiting, diarrhea, constipation, melena or hematochezia. GU:  No urgency, frequency, dysuria, or hematuria. Musculoskeletal:  No bone pain.  No muscle tenderness.  Takes Claritin to prevent Neulasta induced bone pain. Extremities:  No pain or swelling. Skin:  Urticaria, almost resolved. Neuro:  No headache, numbness or weakness, balance or coordination issues. Endocrine:  No diabetes, thyroid issues, hot flashes or night sweats. Psych:  No mood changes, depression or anxiety. Pain: No pain. Review of systems:  All other systems reviewed and found to be negative.  Physical Exam: Blood pressure 112/76, pulse 71, temperature 97.6 F (36.4 C), temperature source Tympanic, resp. rate 18, weight 195 lb 4 oz (88.6 kg). GENERAL:  Well developed, well nourished, gentleman sitting comfortably in the exam room in no acute distress. MENTAL STATUS:  Alert and oriented to person, place and time. HEAD:  Wearing a cap.  Black hair.  Thin mustache.  Normocephalic, atraumatic, face symmetric, no Cushingoid features. EYES:  Brown eyes.  No conjunctivitis or scleral icterus. ENT:  Oropharynx clear without lesion.  Tongue normal. Mucous membranes moist.  RESPIRATORY:  Clear to auscultation without rales, wheezes or rhonchi. CARDIOVASCULAR:   Regular rate and rhythm without murmur, rub or gallop. ABDOMEN:  Slightly distended.  Soft, non-tender with active bowel sounds and no hepatosplenomegaly.  No masses. SKIN:  No urticaria.  No rashes or ulcers. EXTREMITIES: No edema, no skin discoloration or tenderness.  No palpable cords. LYMPH NODES: No palpable cervical, supraclavicular, axillary or inguinal adenopathy  NEUROLOGICAL: Unremarkable. PSYCH:  Appropriate.    Imaging studies: 07/29/2015:  Chest, abdomen and pelvic CT revealed a 4 cm thickened and irregular gastric wall compatible with an infiltrative malignancy. There was extensive involvement of the upper mesentery and omentum compatible with peritoneal carcinomatosis. There was a small amount of ascites.    08/08/2015:  Head CT revealed no evidence of metastatic disease. 10/05/2015:  Abdomen and pelvic CT revealed a positive response to therapy with decreased size of the primary mass along the lesser curvature of the stomach, decrease extension of the mass into the gastrohepatic ligament, decreasing evidence of peritoneal carcinomatosis involving predominantly the omentum, and resolution  of previously noted malignant ascites. There was a potential ulcer along the greater curvature of the mid body of the stomach.   12/24/2015:  Abdomen and pelvic CT revealed further improvement in primary gastric malignancy and adjacent lymphadenopathy. There was no residual peritoneal carcinomatosis identified. 03/21/2016:  Abdominal CT revealed new small volume ascites within the abdomen extending over the liver and spleen.  There was increase soft tissue stranding throughout the peritoneal cavity worrisome for recurrence of peritoneal carcinomatosis. 04/24/2016:  Abdomen and pelvic CT revealed progressive intraperitoneal metastatic disease with increased ascites and omental caking.   Appointment on 05/01/2016  Component Date Value Ref Range Status  . Sodium 05/01/2016 137  135 - 145 mmol/L Final   . Potassium 05/01/2016 3.4* 3.5 - 5.1 mmol/L Final  . Chloride 05/01/2016 106  101 - 111 mmol/L Final  . CO2 05/01/2016 24  22 - 32 mmol/L Final  . Glucose, Bld 05/01/2016 122* 65 - 99 mg/dL Final  . BUN 05/01/2016 12  6 - 20 mg/dL Final  . Creatinine, Ser 05/01/2016 0.74  0.61 - 1.24 mg/dL Final  . Calcium 05/01/2016 8.7* 8.9 - 10.3 mg/dL Final  . Total Protein 05/01/2016 7.4  6.5 - 8.1 g/dL Final  . Albumin 05/01/2016 4.0  3.5 - 5.0 g/dL Final  . AST 05/01/2016 30  15 - 41 U/L Final  . ALT 05/01/2016 24  17 - 63 U/L Final  . Alkaline Phosphatase 05/01/2016 80  38 - 126 U/L Final  . Total Bilirubin 05/01/2016 0.9  0.3 - 1.2 mg/dL Final  . GFR calc non Af Amer 05/01/2016 >60  >60 mL/min Final  . GFR calc Af Amer 05/01/2016 >60  >60 mL/min Final   Comment: (NOTE) The eGFR has been calculated using the CKD EPI equation. This calculation has not been validated in all clinical situations. eGFR's persistently <60 mL/min signify possible Chronic Kidney Disease.   . Anion gap 05/01/2016 7  5 - 15 Final  . WBC 05/01/2016 4.5  3.8 - 10.6 K/uL Final  . RBC 05/01/2016 5.10  4.40 - 5.90 MIL/uL Final  . Hemoglobin 05/01/2016 15.7  13.0 - 18.0 g/dL Final  . HCT 05/01/2016 44.6  40.0 - 52.0 % Final  . MCV 05/01/2016 87.5  80.0 - 100.0 fL Final  . MCH 05/01/2016 30.8  26.0 - 34.0 pg Final  . MCHC 05/01/2016 35.2  32.0 - 36.0 g/dL Final  . RDW 05/01/2016 13.4  11.5 - 14.5 % Final  . Platelets 05/01/2016 135* 150 - 440 K/uL Final  . Neutrophils Relative % 05/01/2016 56  % Final  . Neutro Abs 05/01/2016 2.6  1.4 - 6.5 K/uL Final  . Lymphocytes Relative 05/01/2016 29  % Final  . Lymphs Abs 05/01/2016 1.3  1.0 - 3.6 K/uL Final  . Monocytes Relative 05/01/2016 12  % Final  . Monocytes Absolute 05/01/2016 0.5  0.2 - 1.0 K/uL Final  . Eosinophils Relative 05/01/2016 2  % Final  . Eosinophils Absolute 05/01/2016 0.1  0 - 0.7 K/uL Final  . Basophils Relative 05/01/2016 1  % Final  . Basophils  Absolute 05/01/2016 0.0  0 - 0.1 K/uL Final  . Magnesium 05/01/2016 2.1  1.7 - 2.4 mg/dL Final    Assessment:  Anthony Melendez is a 36 y.o. male with metastatic gastric cancer.  He presented with a 4-6 week history of progressive epigastric pain.  EGD on 08/02/2015 revealed a large, ulcerated, non-circumferential mass with oozing and stigmata of recent bleeding in the  entire examined stomach.  The mass involved the fundus down to the antrum.  Biopsies revealed poorly differentiated adenocarcinoma.  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  Her2/neu testing was negative.  Microsatellite testing was negative.  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  PDL-1 was expressed.  CEA was 0.3 on 07/30/2015.  Chest, abdomen and pelvic CT scan on 07/29/2015 revealed a 4 cm thickened and irregular gastric wall compatible with an infiltrative malignancy. There was extensive involvement of the upper mesentery and omentum compatible with peritoneal carcinomatosis. There was a small amount of ascites.     He completed treatment for H pylori  (began 08/09/2015).  Stool was negative for H pylori on 11/01/2015.  He has iron deficiency anemia likely gastric oozing and modest diet.  RBC are microcytic.  Labs on 08/21/2015 revealed a ferritin was 44 with an iron saturation 4% and a TIBC of 395.  Ferritin was 116 on 09/18/2015.  He received 12 cycles of FOLFOX chemotherapy (08/14/2015 - 02/05/2016).  Chemotherapy was held on 10/23/2015 secondary to thrombocytopenia (87,000).  Platelet count was 90,000 prior to cycle #11.  He has tolerated his chemotherapy well without diarrhea, mouth sores, or neuropathy.  He received 3 cycles of maintenance 5FU/LV (03/03/2016 - 04/08/2016).  EGD on 02/26/2016 revealed one non-bleeding gastric ulcer.   The mass  appeared smaller.  Biopsy revealed moderate chronic gastritis and edema, negative for H pylori, dysplasia and malignancy.  Abdomen and pelvic CT on 04/24/2016 revealed progressive intraperitoneal metastatic disease with increased ascites and omental caking.  He has urticaria of unclear etiology.  He is followed by dermatology.  He takes Zyrtec every morning and hydroxyzine at night.  He takes Xyzal 5 mg daily.  He was to buy triamcinolone cerave and apply to body from neck down daily.  He was instructed to use mild soaps and not to use perfumes.   Symptomatically, urticaria/hives have almost cleared.  He has abdominal discomfortunder his ribs (left > right) and in the left lower quadrant.  He feels full/bloated.  He feels like he is carrying a heavy back pack.  Exam reveals mild abdominal distention.  Plan: 1.  Labs today:  CBC with diff, CMP, Mg. 2.  Day 1 of cycle #1 Taxol and ramucirumab today. 3.  RTC in 1 week for labs (CBC with diff, CMP) and day 8 of cycle #1 Taxol. 4.  RTC in 2 weeks for MD assessment, labs (CBC with diff, CMP, Mg), and day 15 of cycle #1 Taxol and ramucirumab. 5.  RTC in 3 weeks for labs (CBC with diff). 6.  RTC in 4 weeks for MD assessment, labs (CBC with diff, CMP, Mg), and day 1 of cycle #2 Taxol and ramucirumab.   Addendum:  At the end of his infusion today, while his line was being flushed, he developed chest tightness and trouble getting a deep breath.  Vitals remained stable.  He was temporarily placed on oxygen via nasal cannulae.  He received Benadryl 25 mg.  Symptoms resolved quickly.   Lequita Asal, MD  05/01/2016, 9:16 AM

## 2016-05-01 NOTE — Progress Notes (Signed)
At 1440, pt reports that he feels back to his baseline.  No complaints noted.  Pt left the chemo area stable and ambulatory with his family.  Aware to call our office should he have any further complaints.

## 2016-05-01 NOTE — Progress Notes (Signed)
13:40 - patient just finished Taxol and was flushing for about five minutes and complained he was having trouble getting a deep breath and has chest tightness.  Increased fluids, took vital signs (see Flowsheet), Oxygen at 2 liters, called Dr. Mike Gip, gave meds (see MAR) per Dr. Mike Gip.  Vital signs, including pulse ox, remained stable.  Dr. Mike Gip examined the patient and instructed to continue monitoring until symptoms resolved.  LJ

## 2016-05-05 ENCOUNTER — Other Ambulatory Visit: Payer: Self-pay | Admitting: Hematology and Oncology

## 2016-05-05 ENCOUNTER — Telehealth: Payer: Self-pay | Admitting: *Deleted

## 2016-05-05 DIAGNOSIS — C786 Secondary malignant neoplasm of retroperitoneum and peritoneum: Secondary | ICD-10-CM

## 2016-05-05 DIAGNOSIS — C801 Malignant (primary) neoplasm, unspecified: Secondary | ICD-10-CM

## 2016-05-05 DIAGNOSIS — C169 Malignant neoplasm of stomach, unspecified: Secondary | ICD-10-CM

## 2016-05-05 NOTE — Telephone Encounter (Addendum)
Per Dr Grayland Ormond, use his Norco. I called back and spoke with Verdis Frederickson and she stated he has some Norco left and will have him to use them and call back if needs more.   She then asked about a prescription that was to be sent to pharmacy for steroids last week, I see in her note that he was given benadryl for reaction. I see nothing in her note regarding starting him on steroids

## 2016-05-05 NOTE — Telephone Encounter (Signed)
Called to report that he is having all over his body. He feels hot, but temps is 98.5. Started new treatment Thursday of Cyramza and Taxol. Complaining of knee pain and H/A also. Please advise. [place] patient

## 2016-05-06 ENCOUNTER — Other Ambulatory Visit: Payer: Self-pay | Admitting: Hematology and Oncology

## 2016-05-06 ENCOUNTER — Encounter: Payer: Self-pay | Admitting: Hematology and Oncology

## 2016-05-06 ENCOUNTER — Telehealth: Payer: Self-pay | Admitting: *Deleted

## 2016-05-06 NOTE — Telephone Encounter (Signed)
Cecille Rubin already told them to go to the ED

## 2016-05-06 NOTE — Telephone Encounter (Signed)
Patient's brother called regarding new onset of bone pain after Neulasta injection. Advised brother to have his brother take Claritin as directed on the package and tylenol as needed for pain. During call back brother informed writer that patient was also experiencing chest pain. Shortness of breath and chest tightness, I advised him to call 911 and go the ER.

## 2016-05-06 NOTE — Telephone Encounter (Signed)
  Patient did not receive Neulasta.  Will need ER evaluation to r/o PE etc.  M

## 2016-05-07 ENCOUNTER — Telehealth: Payer: Self-pay | Admitting: *Deleted

## 2016-05-07 MED ORDER — DEXAMETHASONE 4 MG PO TABS: 4.0000 mg | ORAL_TABLET | Freq: Two times a day (BID) | ORAL | 0 refills | Status: AC

## 2016-05-07 NOTE — Telephone Encounter (Signed)
Called patients brother and he reported that he did not go to the ED yesterday as the chest pain subsided and pt refused to go. Reports that he has had no further chest pain. Instructed brother that if he had any further chest pain that he would need to go to the ED immediately, voiced understanding.  Dr. Rogue Bussing prescribed Decadron 4mg  bid today, 4 mg in the evening on day of chemo and 4 mg bid day after chemo as pt and family were reporting that they were suppose to have steroids ordered per Dr. Mike Gip.  Brother informed of administration and medication voiced understanding.  Patient for chemo tomorrow.

## 2016-05-07 NOTE — Telephone Encounter (Signed)
Attempted to call all family members listed for this patient and the patient.  Unable to leave message on phone as no voice mail was set up and no answer on all three lines.  Attempted to follow up on chest pain from yesterday as it does not appear that the patient went to the ED per EPIC.  Will attempt to call later today.

## 2016-05-08 ENCOUNTER — Inpatient Hospital Stay: Payer: Self-pay | Attending: Oncology

## 2016-05-08 ENCOUNTER — Inpatient Hospital Stay: Payer: Self-pay

## 2016-05-08 VITALS — BP 110/71 | HR 77 | Temp 97.0°F | Resp 20

## 2016-05-08 DIAGNOSIS — C786 Secondary malignant neoplasm of retroperitoneum and peritoneum: Secondary | ICD-10-CM

## 2016-05-08 DIAGNOSIS — R202 Paresthesia of skin: Secondary | ICD-10-CM | POA: Insufficient documentation

## 2016-05-08 DIAGNOSIS — L509 Urticaria, unspecified: Secondary | ICD-10-CM | POA: Insufficient documentation

## 2016-05-08 DIAGNOSIS — C801 Malignant (primary) neoplasm, unspecified: Secondary | ICD-10-CM

## 2016-05-08 DIAGNOSIS — Z5112 Encounter for antineoplastic immunotherapy: Secondary | ICD-10-CM | POA: Insufficient documentation

## 2016-05-08 DIAGNOSIS — D509 Iron deficiency anemia, unspecified: Secondary | ICD-10-CM | POA: Insufficient documentation

## 2016-05-08 DIAGNOSIS — E876 Hypokalemia: Secondary | ICD-10-CM | POA: Insufficient documentation

## 2016-05-08 DIAGNOSIS — K625 Hemorrhage of anus and rectum: Secondary | ICD-10-CM | POA: Insufficient documentation

## 2016-05-08 DIAGNOSIS — D72819 Decreased white blood cell count, unspecified: Secondary | ICD-10-CM | POA: Insufficient documentation

## 2016-05-08 DIAGNOSIS — K259 Gastric ulcer, unspecified as acute or chronic, without hemorrhage or perforation: Secondary | ICD-10-CM | POA: Insufficient documentation

## 2016-05-08 DIAGNOSIS — Z1509 Genetic susceptibility to other malignant neoplasm: Secondary | ICD-10-CM | POA: Insufficient documentation

## 2016-05-08 DIAGNOSIS — R188 Other ascites: Secondary | ICD-10-CM | POA: Insufficient documentation

## 2016-05-08 DIAGNOSIS — K219 Gastro-esophageal reflux disease without esophagitis: Secondary | ICD-10-CM | POA: Insufficient documentation

## 2016-05-08 DIAGNOSIS — Z5111 Encounter for antineoplastic chemotherapy: Secondary | ICD-10-CM | POA: Insufficient documentation

## 2016-05-08 DIAGNOSIS — R140 Abdominal distension (gaseous): Secondary | ICD-10-CM | POA: Insufficient documentation

## 2016-05-08 DIAGNOSIS — C162 Malignant neoplasm of body of stomach: Secondary | ICD-10-CM

## 2016-05-08 DIAGNOSIS — Z79899 Other long term (current) drug therapy: Secondary | ICD-10-CM | POA: Insufficient documentation

## 2016-05-08 DIAGNOSIS — G620 Drug-induced polyneuropathy: Secondary | ICD-10-CM | POA: Insufficient documentation

## 2016-05-08 DIAGNOSIS — Z7189 Other specified counseling: Secondary | ICD-10-CM

## 2016-05-08 LAB — COMPREHENSIVE METABOLIC PANEL
ALT: 23 U/L (ref 17–63)
AST: 30 U/L (ref 15–41)
Albumin: 3.8 g/dL (ref 3.5–5.0)
Alkaline Phosphatase: 80 U/L (ref 38–126)
Anion gap: 5 (ref 5–15)
BUN: 12 mg/dL (ref 6–20)
CO2: 26 mmol/L (ref 22–32)
Calcium: 8.5 mg/dL — ABNORMAL LOW (ref 8.9–10.3)
Chloride: 105 mmol/L (ref 101–111)
Creatinine, Ser: 0.6 mg/dL — ABNORMAL LOW (ref 0.61–1.24)
GFR calc Af Amer: >60 mL/min (ref >60)
GFR calc non Af Amer: >60 mL/min (ref >60)
Glucose, Bld: 103 mg/dL — ABNORMAL HIGH (ref 65–99)
Potassium: 3.8 mmol/L (ref 3.5–5.1)
Sodium: 136 mmol/L (ref 135–145)
Total Bilirubin: 0.7 mg/dL (ref 0.3–1.2)
Total Protein: 6.9 g/dL (ref 6.5–8.1)

## 2016-05-08 LAB — CBC WITH DIFFERENTIAL/PLATELET
Basophils Absolute: 0 10*3/uL (ref 0–0.1)
Basophils Relative: 1 %
Eosinophils Absolute: 0.1 10*3/uL (ref 0–0.7)
Eosinophils Relative: 3 %
HCT: 41.5 % (ref 40.0–52.0)
Hemoglobin: 14.7 g/dL (ref 13.0–18.0)
Lymphocytes Relative: 39 %
Lymphs Abs: 1.7 10*3/uL (ref 1.0–3.6)
MCH: 30.7 pg (ref 26.0–34.0)
MCHC: 35.4 g/dL (ref 32.0–36.0)
MCV: 86.7 fL (ref 80.0–100.0)
Monocytes Absolute: 0.2 10*3/uL (ref 0.2–1.0)
Monocytes Relative: 5 %
Neutro Abs: 2.2 10*3/uL (ref 1.4–6.5)
Neutrophils Relative %: 52 %
Platelets: 179 10*3/uL (ref 150–440)
RBC: 4.78 MIL/uL (ref 4.40–5.90)
RDW: 12.9 % (ref 11.5–14.5)
WBC: 4.3 10*3/uL (ref 3.8–10.6)

## 2016-05-08 MED ORDER — SODIUM CHLORIDE 0.9 % IV SOLN
80.0000 mg/m2 | Freq: Once | INTRAVENOUS | Status: AC
  Administered 2016-05-08: 162 mg via INTRAVENOUS
  Filled 2016-05-08: qty 27

## 2016-05-08 MED ORDER — SODIUM CHLORIDE 0.9 % IV SOLN: 10.0000 mg | Freq: Once | INTRAVENOUS | Status: DC

## 2016-05-08 MED ORDER — DEXAMETHASONE SODIUM PHOSPHATE 10 MG/ML IJ SOLN
10.0000 mg | Freq: Once | INTRAMUSCULAR | Status: AC
  Administered 2016-05-08: 10 mg via INTRAVENOUS
  Filled 2016-05-08: qty 1

## 2016-05-08 MED ORDER — HEPARIN SOD (PORK) LOCK FLUSH 100 UNIT/ML IV SOLN
500.0000 [IU] | Freq: Once | INTRAVENOUS | Status: AC | PRN
  Administered 2016-05-08: 500 [IU]
  Filled 2016-05-08: qty 5

## 2016-05-08 MED ORDER — SODIUM CHLORIDE 0.9 % IV SOLN
Freq: Once | INTRAVENOUS | Status: AC
  Administered 2016-05-08: 09:00:00 via INTRAVENOUS
  Filled 2016-05-08: qty 1000

## 2016-05-08 MED ORDER — DIPHENHYDRAMINE HCL 50 MG/ML IJ SOLN
50.0000 mg | Freq: Once | INTRAMUSCULAR | Status: AC
  Administered 2016-05-08: 50 mg via INTRAVENOUS
  Filled 2016-05-08: qty 1

## 2016-05-08 MED ORDER — FAMOTIDINE IN NACL 20-0.9 MG/50ML-% IV SOLN
20.0000 mg | Freq: Once | INTRAVENOUS | Status: AC
  Administered 2016-05-08: 20 mg via INTRAVENOUS
  Filled 2016-05-08: qty 50

## 2016-05-12 ENCOUNTER — Emergency Department
Admission: EM | Admit: 2016-05-12 | Discharge: 2016-05-13 | Disposition: A | Discharge status: Home / Self Care | Payer: Self-pay | Attending: Emergency Medicine | Admitting: Emergency Medicine

## 2016-05-12 ENCOUNTER — Encounter: Payer: Self-pay | Admitting: Emergency Medicine

## 2016-05-12 DIAGNOSIS — K625 Hemorrhage of anus and rectum: Secondary | ICD-10-CM | POA: Insufficient documentation

## 2016-05-12 DIAGNOSIS — Z79899 Other long term (current) drug therapy: Secondary | ICD-10-CM | POA: Insufficient documentation

## 2016-05-12 DIAGNOSIS — R202 Paresthesia of skin: Secondary | ICD-10-CM | POA: Insufficient documentation

## 2016-05-12 DIAGNOSIS — G629 Polyneuropathy, unspecified: Secondary | ICD-10-CM | POA: Insufficient documentation

## 2016-05-12 NOTE — ED Notes (Signed)
Pt does not speak [place], Spanish interpreter requested.

## 2016-05-12 NOTE — ED Triage Notes (Addendum)
Patient ambulatory to triage with steady gait, without difficulty or distress noted; pt with gastric CA, currently received chemo; having rectal bleeding with sharp abd pain radiating down right leg since Sunday

## 2016-05-13 LAB — COMPREHENSIVE METABOLIC PANEL
ALK PHOS: 80 U/L (ref 38–126)
ALT: 28 U/L (ref 17–63)
AST: 29 U/L (ref 15–41)
Albumin: 4 g/dL (ref 3.5–5.0)
Anion gap: 5 (ref 5–15)
BILIRUBIN TOTAL: 0.4 mg/dL (ref 0.3–1.2)
BUN: 14 mg/dL (ref 6–20)
CO2: 29 mmol/L (ref 22–32)
CREATININE: 0.72 mg/dL (ref 0.61–1.24)
Calcium: 8.3 mg/dL — ABNORMAL LOW (ref 8.9–10.3)
Chloride: 105 mmol/L (ref 101–111)
GFR calc Af Amer: >60 mL/min (ref >60)
Glucose, Bld: 169 mg/dL — ABNORMAL HIGH (ref 65–99)
POTASSIUM: 3.5 mmol/L (ref 3.5–5.1)
Sodium: 139 mmol/L (ref 135–145)
TOTAL PROTEIN: 7.1 g/dL (ref 6.5–8.1)

## 2016-05-13 LAB — CBC
HEMATOCRIT: 41.1 % (ref 40.0–52.0)
Hemoglobin: 14.3 g/dL (ref 13.0–18.0)
MCH: 29.9 pg (ref 26.0–34.0)
MCHC: 34.7 g/dL (ref 32.0–36.0)
MCV: 86.2 fL (ref 80.0–100.0)
Platelets: 140 10*3/uL — ABNORMAL LOW (ref 150–440)
RBC: 4.77 MIL/uL (ref 4.40–5.90)
RDW: 13.3 % (ref 11.5–14.5)
WBC: 3 10*3/uL — AB (ref 3.8–10.6)

## 2016-05-13 LAB — TYPE AND SCREEN
ABO/RH(D): A POS
Antibody Screen: NEGATIVE

## 2016-05-13 LAB — TROPONIN I
Troponin I: <0.03 ng/mL (ref <0.03)
Troponin I: <0.03 ng/mL (ref <0.03)

## 2016-05-13 LAB — MAGNESIUM: Magnesium: 2.1 mg/dL (ref 1.7–2.4)

## 2016-05-13 MED ORDER — GABAPENTIN 100 MG PO CAPS: 100.0000 mg | ORAL_CAPSULE | Freq: Three times a day (TID) | ORAL | 0 refills | Status: DC

## 2016-05-13 NOTE — ED Provider Notes (Signed)
[facility] Emergency Department Provider Note   ____________________________________________   None    (approximate)  I have reviewed the triage vital signs and the nursing notes.   HISTORY  Chief Complaint Rectal Bleeding    HPI Anthony Melendez is a 36 y.o. male who comes into the hospital today with multiple complaints. The patient has a history of stomach cancer and has had chemotherapy twice. The patient reports that he noticed some blood in his stool twice today. He was told that from the chemotherapy he may have some but then he states that his doctor told him to come in with the blood in his stool. He reports that he's also been having pinching/perking pain to his legs, arms and chest. He reports that he's been having in his abdomen as well. The patient reports that he's been having some headaches and is Ace feels hot. He's had some symptoms since April 2 which was a week ago. He reports that he did speak to the doctor about it and was prescribed hydrocodone for pain. He reports that the pain was very intense yesterday and he took the hydrocodone but he has not taken any today. He reports that it does not take away this pricking pain. He reports it is all over his legs and his arms. The patient reports that he did have some chest pain with some shortness of breath and some pressure in his chest. The patient reports that his doctor told him to come into the hospital so he decided to come in and get checked out today. He reports though that his doctor told him he might have the parking pain in his body and that could be from his chemotherapy.   Past Medical History:  Diagnosis Date  . Gastric cancer ([place]) 08/2015   Folfox chemo tx's.  Marland Kitchen GERD (gastroesophageal reflux disease)     Patient Active Problem List   Diagnosis Date Noted  . Urticaria 04/22/2016  . Chemotherapy-induced peripheral neuropathy ([place]) 03/03/2016  . Chronic peptic ulcer of stomach     . Encounter for antineoplastic chemotherapy 01/15/2016  . 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 Chronic gastric ulcer 10/28/2015  . Hypocalcemia 09/30/2015  . Iron deficiency anemia 08/28/2015  . Microcytic red blood cells 08/14/2015  . Hypokalemia 08/14/2015  . Malignant neoplasm of body of stomach ([place]) 08/07/2015  . Helicobacter pylori gastritis 08/02/2015  . Abnormal findings-gastrointestinal tract   . Neoplasm of digestive system   . Gastric wall thickening 08/01/2015  . 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 ESOPHAGOGASTRODUODENOSCOPY (EGD) WITH PROPOFOL N/A 08/02/2015   Procedure: ESOPHAGOGASTRODUODENOSCOPY (EGD) WITH PROPOFOL;  Surgeon: Lucilla Lame, MD;  Location: [place];  Service: Endoscopy;  Laterality: N/A;  . ESOPHAGOGASTRODUODENOSCOPY (EGD) WITH PROPOFOL N/A 02/26/2016   Procedure: ESOPHAGOGASTRODUODENOSCOPY (EGD) WITH PROPOFOL;  Surgeon: Lucilla Lame, MD;  Location: [facility];  Service: Endoscopy;  Laterality: N/A;  . NO PAST SURGERIES    . PERIPHERAL VASCULAR CATHETERIZATION N/A 08/13/2015   Procedure: Glori Luis Cath Insertion;  Surgeon: Algernon Huxley, MD;  Location: [place] [facility];  Service: Cardiovascular;  Laterality: N/A;    Prior to Admission medications   Medication Sig Start Date End Date Taking? Authorizing Provider  ferrous sulfate 325 (65 FE) MG EC tablet Take 1 tablet (325 mg total) by mouth daily with breakfast. 08/28/15  Yes Lequita Asal, MD  HYDROcodone-acetaminophen (NORCO) 5-325 MG tablet Take 1 tablet by mouth  every 6 (six) hours as needed for moderate pain. 11/13/15  Yes Rosey Bath, MD  levocetirizine (XYZAL) 5 MG tablet Take 5 mg by mouth every evening.   Yes Historical Provider, MD  omeprazole (PRILOSEC) 20 MG capsule Take 1 capsule (20 mg total) by mouth 2 (two) times daily before a meal. 03/26/16  Yes Rosey Bath, MD  ondansetron (ZOFRAN) 8 MG tablet Take 1  tablet (8 mg total) by mouth 2 (two) times daily as needed (Nausea or vomiting). 05/01/16  Yes Rosey Bath, MD  potassium chloride (K-DUR) 10 MEQ tablet Take 1 tablet daily 02/19/16  Yes Rosey Bath, MD  prochlorperazine (COMPAZINE) 10 MG tablet Take 1 tablet (10 mg total) by mouth every 6 (six) hours as needed for nausea or vomiting. 02/05/16  Yes Rosey Bath, MD  gabapentin (NEURONTIN) 100 MG capsule Take 1 capsule (100 mg total) by mouth 3 (three) times daily. 05/13/16 05/13/17  Rebecka Apley, MD  hydrOXYzine (ATARAX/VISTARIL) 25 MG tablet Take 25 mg by mouth daily.    Historical Provider, MD    Allergies Iodine  No family history on file.  Social History Social History  Substance Use Topics  . Smoking status: Never Smoker  . Smokeless tobacco: Never Used  . Alcohol use No    Review of Systems Constitutional: No fever/chills Eyes: No visual changes. ENT: No sore throat. Cardiovascular: Denies chest pain. Respiratory: Denies shortness of breath. Gastrointestinal: Rectal bleeding, mild abdominal pain.  No nausea, no vomiting.  No diarrhea.  No constipation. Genitourinary: Negative for dysuria. Musculoskeletal: Negative for back pain. Skin: Negative for rash. Neurological: Paresthesias  10-point ROS otherwise negative.  ____________________________________________   PHYSICAL EXAM:  VITAL SIGNS: ED Triage Vitals  Enc Vitals Group     BP 05/12/16 2239 (!) 144/95     Pulse Rate 05/12/16 2239 98     Resp 05/12/16 2239 20     Temp 05/12/16 2239 97.9 F (36.6 C)     Temp Source 05/12/16 2239 Oral     SpO2 05/12/16 2239 97 %     Weight 05/12/16 2247 195 lb (88.5 kg)     Height 05/12/16 2247 5\' 5"  (1.651 m)     Head Circumference --      Peak Flow --      Pain Score 05/12/16 2246 5     Pain Loc --      Pain Edu? --      Excl. in GC? --     Constitutional: Alert and oriented. Well appearing and in mild distress. Eyes: Conjunctivae are normal.  PERRL. EOMI. Head: Atraumatic. Nose: No congestion/rhinnorhea. Mouth/Throat: Mucous membranes are moist.  Oropharynx non-erythematous. Cardiovascular: Normal rate, regular rhythm. Grossly normal heart sounds.  Good peripheral circulation. Respiratory: Normal respiratory effort.  No retractions. Lungs CTAB. Gastrointestinal: Soft with some mild tenderness to palpation diffusely. No distention. Positive bowel sounds Rectal: Normal rectal tone with gross blood that is heme positive, control positive Musculoskeletal: No lower extremity tenderness nor edema.   Neurologic:  Normal speech and language.  Skin:  Skin is warm, dry and intact.  Psychiatric: Mood and affect are normal.   ____________________________________________   LABS (all labs ordered are listed, but only abnormal results are displayed)  Labs Reviewed  CBC - Abnormal; Notable for the following:       Result Value   WBC 3.0 (*)    Platelets 140 (*)    All other components within normal limits  COMPREHENSIVE  METABOLIC PANEL - Abnormal; Notable for the following:    Glucose, Bld 169 (*)    Calcium 8.3 (*)    All other components within normal limits  TROPONIN I  MAGNESIUM  TROPONIN I  TYPE AND SCREEN   ____________________________________________  EKG  none ____________________________________________  RADIOLOGY  none ____________________________________________   PROCEDURES  Procedure(s) performed: None  Procedures  Critical Care performed: No  ____________________________________________   INITIAL IMPRESSION / ASSESSMENT AND PLAN / ED COURSE  Pertinent labs & imaging results that were available during my care of the patient were reviewed by me and considered in my medical decision making (see chart for details).  This is a 36 year old male who comes into the hospital today with some paresthesia-like pain to his arms legs chest and abdomen. The patient also had some blood in his stool. I looked  back at the notes from the patient's oncology office. The patient has been told for multiple days to come into the emergency department. It was because he had some chest pain. They were concerned about a possible PE. The patient reports that it is not pain in his chest that he has an he is not having any shortness of breath or pain with deep inspiration. He reports this as pricking pain that he has it is bothering him. The patient's blood work including a magnesium and a troponin are unremarkable. I contacted Dr. Grayland Ormond who was the oncologist on-call and discussed this with him. I did discover that Taxol which is the medication the patient is receiving for chemotherapy can cause a peripheral neuropathy. Dr. Grayland Ormond agrees that this is likely a peripheral neuropathy that the patient is having. I will prescribe him some gabapentin. I also spoke to Dr. Grayland Ormond about the rectal bleeding. The patient's hemoglobin is 14.3 and his hematocrit is 41.1 which is the same as it was when checked 4 days ago. Since the patient has no mucositis and does not have any abnormal vital signs and Dr. Grayland Ormond feels that he can follow-up with Dr. Mike Gip in the office. The patient is not actively bleeding and has had no bleeding while in the emergency department. I discussed with the patient and he understands. Dr. Grayland Ormond did report though that if this were from the chemotherapy because he has gastric cancer it would come out as black stool. He feels that that the patient can be followed up and the patient does have an appointment on Thursday. The patient will be discharged to home to follow up with his oncologist.      ____________________________________________   FINAL CLINICAL IMPRESSION(S) / ED DIAGNOSES  Final diagnoses:  Rectal bleeding  Neuropathy ([place])  Paresthesia      NEW MEDICATIONS STARTED DURING THIS VISIT:  Discharge Medication List as of 05/13/2016  4:12 AM    START taking these medications    Details  gabapentin (NEURONTIN) 100 MG capsule Take 1 capsule (100 mg total) by mouth 3 (three) times daily., Starting Tue 05/13/2016, Until Wed 05/13/2017, Print         Note:  This document was prepared using Dragon voice recognition software and may include unintentional dictation errors.    Loney Hering, MD 05/13/16 [phone]

## 2016-05-13 NOTE — ED Notes (Signed)
Pt assisted to ambulate from the stretcher to the commode and back, pt displays steady, balanced gait, and does not appear to be in distress.

## 2016-05-15 ENCOUNTER — Inpatient Hospital Stay: Payer: Self-pay

## 2016-05-15 ENCOUNTER — Encounter: Payer: Self-pay | Admitting: Oncology

## 2016-05-15 ENCOUNTER — Inpatient Hospital Stay (HOSPITAL_BASED_OUTPATIENT_CLINIC_OR_DEPARTMENT_OTHER): Payer: Self-pay | Admitting: Oncology

## 2016-05-15 ENCOUNTER — Other Ambulatory Visit: Payer: Self-pay | Admitting: Hematology and Oncology

## 2016-05-15 VITALS — BP 145/95 | HR 85 | Temp 96.8°F | Resp 18 | Wt 193.1 lb

## 2016-05-15 VITALS — BP 128/68 | HR 80 | Resp 18

## 2016-05-15 DIAGNOSIS — K625 Hemorrhage of anus and rectum: Secondary | ICD-10-CM

## 2016-05-15 DIAGNOSIS — D72819 Decreased white blood cell count, unspecified: Secondary | ICD-10-CM

## 2016-05-15 DIAGNOSIS — T451X5A Adverse effect of antineoplastic and immunosuppressive drugs, initial encounter: Secondary | ICD-10-CM

## 2016-05-15 DIAGNOSIS — Z79899 Other long term (current) drug therapy: Secondary | ICD-10-CM

## 2016-05-15 DIAGNOSIS — C162 Malignant neoplasm of body of stomach: Secondary | ICD-10-CM

## 2016-05-15 DIAGNOSIS — Z5111 Encounter for antineoplastic chemotherapy: Secondary | ICD-10-CM

## 2016-05-15 DIAGNOSIS — C801 Malignant (primary) neoplasm, unspecified: Secondary | ICD-10-CM

## 2016-05-15 DIAGNOSIS — L509 Urticaria, unspecified: Secondary | ICD-10-CM

## 2016-05-15 DIAGNOSIS — C786 Secondary malignant neoplasm of retroperitoneum and peritoneum: Secondary | ICD-10-CM

## 2016-05-15 DIAGNOSIS — Z1509 Genetic susceptibility to other malignant neoplasm: Secondary | ICD-10-CM

## 2016-05-15 DIAGNOSIS — Z7189 Other specified counseling: Secondary | ICD-10-CM

## 2016-05-15 DIAGNOSIS — R188 Other ascites: Secondary | ICD-10-CM

## 2016-05-15 DIAGNOSIS — G620 Drug-induced polyneuropathy: Secondary | ICD-10-CM

## 2016-05-15 DIAGNOSIS — K219 Gastro-esophageal reflux disease without esophagitis: Secondary | ICD-10-CM

## 2016-05-15 DIAGNOSIS — D509 Iron deficiency anemia, unspecified: Secondary | ICD-10-CM

## 2016-05-15 LAB — CBC WITH DIFFERENTIAL/PLATELET
Basophils Absolute: 0 10*3/uL (ref 0–0.1)
Basophils Relative: 1 %
Eosinophils Absolute: 0.1 10*3/uL (ref 0–0.7)
Eosinophils Relative: 2 %
HCT: 38.4 % — ABNORMAL LOW (ref 40.0–52.0)
Hemoglobin: 13.6 g/dL (ref 13.0–18.0)
Lymphocytes Relative: 50 %
Lymphs Abs: 1.5 10*3/uL (ref 1.0–3.6)
MCH: 30.3 pg (ref 26.0–34.0)
MCHC: 35.5 g/dL (ref 32.0–36.0)
MCV: 85.4 fL (ref 80.0–100.0)
Monocytes Absolute: 0.2 10*3/uL (ref 0.2–1.0)
Monocytes Relative: 7 %
Neutro Abs: 1.2 10*3/uL — ABNORMAL LOW (ref 1.4–6.5)
Neutrophils Relative %: 40 %
Platelets: 163 10*3/uL (ref 150–440)
RBC: 4.5 MIL/uL (ref 4.40–5.90)
RDW: 13 % (ref 11.5–14.5)
WBC: 3 10*3/uL — ABNORMAL LOW (ref 3.8–10.6)

## 2016-05-15 LAB — URINALYSIS, COMPLETE (UACMP) WITH MICROSCOPIC
BACTERIA UA: NONE SEEN
Bilirubin Urine: NEGATIVE
GLUCOSE, UA: NEGATIVE mg/dL
Hgb urine dipstick: NEGATIVE
KETONES UR: NEGATIVE mg/dL
Leukocytes, UA: NEGATIVE
Nitrite: NEGATIVE
PROTEIN: NEGATIVE mg/dL
SQUAMOUS EPITHELIAL / LPF: NONE SEEN
Specific Gravity, Urine: 1.006 (ref 1.005–1.030)
pH: 7 (ref 5.0–8.0)

## 2016-05-15 LAB — COMPREHENSIVE METABOLIC PANEL
ALT: 23 U/L (ref 17–63)
AST: 25 U/L (ref 15–41)
Albumin: 3.8 g/dL (ref 3.5–5.0)
Alkaline Phosphatase: 79 U/L (ref 38–126)
Anion gap: 5 (ref 5–15)
BUN: 8 mg/dL (ref 6–20)
CO2: 27 mmol/L (ref 22–32)
Calcium: 8.4 mg/dL — ABNORMAL LOW (ref 8.9–10.3)
Chloride: 106 mmol/L (ref 101–111)
Creatinine, Ser: 0.54 mg/dL — ABNORMAL LOW (ref 0.61–1.24)
GFR calc Af Amer: >60 mL/min (ref >60)
GFR calc non Af Amer: >60 mL/min (ref >60)
Glucose, Bld: 100 mg/dL — ABNORMAL HIGH (ref 65–99)
Potassium: 3.6 mmol/L (ref 3.5–5.1)
Sodium: 138 mmol/L (ref 135–145)
Total Bilirubin: 0.7 mg/dL (ref 0.3–1.2)
Total Protein: 7 g/dL (ref 6.5–8.1)

## 2016-05-15 LAB — MAGNESIUM: Magnesium: 2.1 mg/dL (ref 1.7–2.4)

## 2016-05-15 MED ORDER — SODIUM CHLORIDE 0.9% FLUSH
10.0000 mL | Freq: Once | INTRAVENOUS | Status: AC
Start: 2016-05-15 — End: 2016-05-15
  Administered 2016-05-15: 10 mL via INTRAVENOUS
  Filled 2016-05-15: qty 10

## 2016-05-15 MED ORDER — HEPARIN SOD (PORK) LOCK FLUSH 100 UNIT/ML IV SOLN
500.0000 [IU] | Freq: Once | INTRAVENOUS | Status: AC | PRN
  Administered 2016-05-15: 500 [IU]

## 2016-05-15 MED ORDER — PACLITAXEL CHEMO INJECTION 300 MG/50ML
80.0000 mg/m2 | Freq: Once | INTRAVENOUS | Status: AC
  Administered 2016-05-15: 162 mg via INTRAVENOUS
  Filled 2016-05-15: qty 27

## 2016-05-15 MED ORDER — ACETAMINOPHEN 325 MG PO TABS
650.0000 mg | ORAL_TABLET | Freq: Once | ORAL | Status: AC
  Administered 2016-05-15: 650 mg via ORAL
  Filled 2016-05-15: qty 2

## 2016-05-15 MED ORDER — RAMUCIRUMAB CHEMO INJECTION 500 MG/50ML
8.0000 mg/kg | Freq: Once | INTRAVENOUS | Status: AC
  Administered 2016-05-15: 700 mg via INTRAVENOUS
  Filled 2016-05-15: qty 50

## 2016-05-15 MED ORDER — DIPHENHYDRAMINE HCL 50 MG/ML IJ SOLN
50.0000 mg | Freq: Once | INTRAMUSCULAR | Status: AC
  Administered 2016-05-15: 50 mg via INTRAVENOUS
  Filled 2016-05-15: qty 1

## 2016-05-15 MED ORDER — FAMOTIDINE IN NACL 20-0.9 MG/50ML-% IV SOLN
20.0000 mg | Freq: Once | INTRAVENOUS | Status: AC
  Administered 2016-05-15: 20 mg via INTRAVENOUS
  Filled 2016-05-15: qty 50

## 2016-05-15 MED ORDER — HEPARIN SOD (PORK) LOCK FLUSH 100 UNIT/ML IV SOLN
500.0000 [IU] | Freq: Once | INTRAVENOUS | Status: DC
Start: 2016-05-15 — End: 2016-05-15

## 2016-05-15 MED ORDER — SODIUM CHLORIDE 0.9 % IV SOLN
Freq: Once | INTRAVENOUS | Status: AC
  Administered 2016-05-15: 12:00:00 via INTRAVENOUS
  Filled 2016-05-15: qty 1000

## 2016-05-15 MED ORDER — DEXAMETHASONE SODIUM PHOSPHATE 10 MG/ML IJ SOLN
10.0000 mg | Freq: Once | INTRAMUSCULAR | Status: AC
  Administered 2016-05-15: 10 mg via INTRAVENOUS
  Filled 2016-05-15: qty 1

## 2016-05-15 MED ORDER — SODIUM CHLORIDE 0.9 % IV SOLN: 10.0000 mg | Freq: Once | INTRAVENOUS | Status: DC

## 2016-05-15 MED ORDER — SODIUM CHLORIDE 0.9% FLUSH
10.0000 mL | Freq: Once | INTRAVENOUS | Status: AC
  Administered 2016-05-15: 10 mL via INTRAVENOUS
  Filled 2016-05-15: qty 10

## 2016-05-15 MED ORDER — GABAPENTIN 100 MG PO CAPS: 100.0000 mg | ORAL_CAPSULE | Freq: Three times a day (TID) | ORAL | 1 refills | Status: DC

## 2016-05-15 NOTE — Progress Notes (Signed)
Hematology/Oncology Consult note [facility]  [phone] [phone]  Patient Care Team: No Pcp Per Patient as PCP - General (General Practice) Lucilla Lame, MD as Consulting Physician (Gastroenterology) Hinda Kehr, MD as Attending Physician (Emergency Medicine)   Name of the patient: Anthony Melendez  [id number]  [birth year]   Date of visit: 05/15/16  Diagnosis- metastatic gastric cancer  Chief complaint/ Reason for visit- on treatment assessment prior to cyramza and taxol  Heme/Onc history: Pervis Macintyre is a 36 y.o. male with metastatic gastric cancer.  He presented with a 4-6 week history of progressive epigastric pain.  EGD on 08/02/2015 revealed a large, ulcerated, non-circumferential mass with oozing and stigmata of recent bleeding in the entire examined stomach.  The mass involved the fundus down to the antrum.  Biopsies revealed poorly differentiated adenocarcinoma.  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  Her2/neu testing was negative.  Microsatellite testing was negative.  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  PDL-1 was expressed.  CEA was 0.3 on 07/30/2015.  Chest, abdomen and pelvic CT scan on 07/29/2015 revealed a 4 cm thickened and irregular gastric wall compatible with an infiltrative malignancy. There was extensive involvement of the upper mesentery and omentum compatible with peritoneal carcinomatosis. There was a small amount of ascites.     He completed treatment for H pylori  (began 08/09/2015).  Stool was negative for H pylori on 11/01/2015.  He has iron deficiency anemia likely gastric oozing and modest diet.  RBC are microcytic.  Labs on 08/21/2015 revealed a ferritin was 44 with an iron saturation 4% and a TIBC of 395.  Ferritin was  116 on 09/18/2015.  He received 12 cycles of FOLFOX chemotherapy (08/14/2015 - 02/05/2016).  Chemotherapy was held on 10/23/2015 secondary to thrombocytopenia (87,000).  Platelet count was 90,000 prior to cycle #11.  He has tolerated his chemotherapy well without diarrhea, mouth sores, or neuropathy.  He received 3 cycles of maintenance 5FU/LV (03/03/2016 - 04/08/2016).  EGD on 02/26/2016 revealed one non-bleeding gastric ulcer.   The mass appeared smaller.  Biopsy revealed moderate chronic gastritis and edema, negative for H pylori, dysplasia and malignancy.  Abdomen and pelvic CT on 04/24/2016 revealed progressive intraperitoneal metastatic disease with increased ascites and omental caking.  He has urticaria of unclear etiology.  He is followed by dermatology.  He takes Zyrtec every morning and hydroxyzine at night.  He takes Xyzal 5 mg daily.  He was to buy triamcinolone cerave and apply to body from neck down daily.  He was instructed to use mild soaps and not to use perfumes.   Interval history- history obtained through interpretor. denies any chest pain or exertional SOB. Reports occasional watery stools with trace blood. Denies any BRBPR. He went to ER on 4/10 and given that his pain was generalized and not chest pain in particular, no intervention done in ER  ECOG PS- 0 Pain scale- 0 Opioid associated constipation- n/a  Review of systems- Review of Systems  Constitutional: Negative for chills, fever, malaise/fatigue and weight loss.  HENT: Negative for congestion, ear discharge and nosebleeds.   Eyes: Negative for blurred vision.  Respiratory: Negative for cough, hemoptysis, sputum production, shortness of breath and wheezing.   Cardiovascular: Negative for chest pain, palpitations, orthopnea and claudication.  Gastrointestinal: Negative for abdominal pain, blood in stool, constipation, diarrhea, heartburn, melena, nausea  and vomiting.  Genitourinary: Negative for dysuria, flank  pain, frequency, hematuria and urgency.  Musculoskeletal: Negative for back pain, joint pain and myalgias.  Skin: Negative for rash.  Neurological: Negative for dizziness, tingling, focal weakness, seizures, weakness and headaches.  Endo/Heme/Allergies: Does not bruise/bleed easily.  Psychiatric/Behavioral: Negative for depression and suicidal ideas. The patient does not have insomnia.      Current treatment- cyramza/taxol  Allergies  Allergen Reactions  . Iodine     Possible anaphylaxis per MD      Past Medical History:  Diagnosis Date  . Gastric cancer ([place]) 08/2015   Folfox chemo tx's.  Marland Kitchen GERD (gastroesophageal reflux disease)      Past Surgical History:  Procedure Laterality Date  . ESOPHAGOGASTRODUODENOSCOPY (EGD) WITH PROPOFOL N/A 08/02/2015   Procedure: ESOPHAGOGASTRODUODENOSCOPY (EGD) WITH PROPOFOL;  Surgeon: Lucilla Lame, MD;  Location: [place];  Service: Endoscopy;  Laterality: N/A;  . ESOPHAGOGASTRODUODENOSCOPY (EGD) WITH PROPOFOL N/A 02/26/2016   Procedure: ESOPHAGOGASTRODUODENOSCOPY (EGD) WITH PROPOFOL;  Surgeon: Lucilla Lame, MD;  Location: [facility];  Service: Endoscopy;  Laterality: N/A;  . NO PAST SURGERIES    . PERIPHERAL VASCULAR CATHETERIZATION N/A 08/13/2015   Procedure: Glori Luis Cath Insertion;  Surgeon: Algernon Huxley, MD;  Location: [place] [facility];  Service: Cardiovascular;  Laterality: N/A;    Social History   Social History  . Marital status: Single    Spouse name: N/A  . Number of children: N/A  . Years of education: N/A   Occupational History  . Not on file.   Social History Main Topics  . Smoking status: Never Smoker  . Smokeless tobacco: Never Used  . Alcohol use No  . Drug use: No  . Sexual activity: Not on file   Other Topics Concern  . Not on file   Social History Narrative  . No narrative on file    No family history on file.   Current Outpatient Prescriptions:  .  ferrous sulfate 325 (65 FE) MG EC tablet,  Take 1 tablet (325 mg total) by mouth daily with breakfast., Disp: 30 tablet, Rfl: 3 .  gabapentin (NEURONTIN) 100 MG capsule, Take 1 capsule (100 mg total) by mouth 3 (three) times daily., Disp: 30 capsule, Rfl: 0 .  HYDROcodone-acetaminophen (NORCO) 5-325 MG tablet, Take 1 tablet by mouth every 6 (six) hours as needed for moderate pain., Disp: 20 tablet, Rfl: 0 .  hydrOXYzine (ATARAX/VISTARIL) 25 MG tablet, Take 25 mg by mouth daily., Disp: , Rfl:  .  levocetirizine (XYZAL) 5 MG tablet, Take 5 mg by mouth every evening., Disp: , Rfl:  .  omeprazole (PRILOSEC) 20 MG capsule, Take 1 capsule (20 mg total) by mouth 2 (two) times daily before a meal., Disp: 60 capsule, Rfl: 2 .  ondansetron (ZOFRAN) 8 MG tablet, Take 1 tablet (8 mg total) by mouth 2 (two) times daily as needed (Nausea or vomiting)., Disp: 30 tablet, Rfl: 1 .  potassium chloride (K-DUR) 10 MEQ tablet, Take 1 tablet daily, Disp: 30 tablet, Rfl: 0 .  prochlorperazine (COMPAZINE) 10 MG tablet, Take 1 tablet (10 mg total) by mouth every 6 (six) hours as needed for nausea or vomiting., Disp: 30 tablet, Rfl: 1 No current facility-administered medications for this visit.   Facility-Administered Medications Ordered in Other Visits:  .  heparin lock flush 100 unit/mL, 500 Units, Intravenous, Once, Lequita Asal, MD .  pegfilgrastim (NEULASTA) injection 6 mg, 6 mg, Subcutaneous, Once, Lequita Asal, MD .  sodium chloride  0.9 % injection 10 mL, 10 mL, Intravenous, Once, Lequita Asal, MD .  sodium chloride flush (NS) 0.9 % injection 10 mL, 10 mL, Intravenous, PRN, Lequita Asal, MD, 10 mL at 08/28/15 0835  Physical exam:  Vitals:   05/15/16 0920  BP: (!) 145/95  Pulse: 85  Resp: 18  Temp: (!) 96.8 F (36 C)  TempSrc: Tympanic  Weight: 193 lb 2 oz (87.6 kg)   Physical Exam  Constitutional: He is oriented to person, place, and time and well-developed, well-nourished, and in no distress.  HENT:  Head: Normocephalic  and atraumatic.  Eyes: EOM are normal. Pupils are equal, round, and reactive to light.  Neck: Normal range of motion.  Cardiovascular: Normal rate, regular rhythm and normal heart sounds.   Pulmonary/Chest: Effort normal and breath sounds normal.  Abdominal: Soft. Bowel sounds are normal.  Neurological: He is alert and oriented to person, place, and time.  Skin: Skin is warm and dry.     CMP Latest Ref Rng & Units 05/12/2016  Glucose 65 - 99 mg/dL 169(H)  BUN 6 - 20 mg/dL 14  Creatinine 0.61 - 1.24 mg/dL 0.72  Sodium 135 - 145 mmol/L 139  Potassium 3.5 - 5.1 mmol/L 3.5  Chloride 101 - 111 mmol/L 105  CO2 22 - 32 mmol/L 29  Calcium 8.9 - 10.3 mg/dL 8.3(L)  Total Protein 6.5 - 8.1 g/dL 7.1  Total Bilirubin 0.3 - 1.2 mg/dL 0.4  Alkaline Phos 38 - 126 U/L 80  AST 15 - 41 U/L 29  ALT 17 - 63 U/L 28   CBC Latest Ref Rng & Units 05/12/2016  WBC 3.8 - 10.6 K/uL 3.0(L)  Hemoglobin 13.0 - 18.0 g/dL 14.3  Hematocrit 40.0 - 52.0 % 41.1  Platelets 150 - 440 K/uL 140(L)    No images are attached to the encounter.  Ct Abdomen Pelvis Wo Contrast  Result Date: 04/24/2016 CLINICAL DATA:  36 year old male with history of epigastric pain and pelvic pain after eating. Patient has metastatic gastric adenocarcinoma. EXAM: CT ABDOMEN AND PELVIS WITHOUT CONTRAST TECHNIQUE: Multidetector CT imaging of the abdomen and pelvis was performed following the standard protocol without IV contrast. COMPARISON:  CT the abdomen and pelvis 03/21/2016. FINDINGS: Lower chest: Unremarkable. Hepatobiliary: No definite cystic or solid hepatic lesions on today's noncontrast CT examination. Unenhanced appearance of the gallbladder is normal. Pancreas: No definite pancreatic mass or peripancreatic inflammatory changes on today's noncontrast CT examination. Spleen: Unremarkable. Adrenals/Urinary Tract: Unenhanced appearance of the kidneys and bilateral adrenal glands is normal. There is no hydroureteronephrosis. Urinary bladder is  normal in appearance. Stomach/Bowel: Unenhanced appearance of the stomach is unremarkable. No pathologic dilatation of small bowel or colon. The appendix is not confidently identified and may be surgically absent. Vascular/Lymphatic: No atherosclerotic calcifications or definite aneurysm identified in the abdominal or pelvic vasculature. No lymphadenopathy noted in the abdomen or pelvis on today's noncontrast CT examination. Reproductive: Prostate gland and seminal vesicles are unremarkable in appearance. Other: Increasing omental soft tissue, compatible with omental caking. Increasing small volume of ascites throughout the peritoneal cavity, likely malignant. No pneumoperitoneum. Musculoskeletal: There are no aggressive appearing lytic or blastic lesions noted in the visualized portions of the skeleton. IMPRESSION: 1. Today's study again demonstrates evidence of progressive intraperitoneal metastatic disease with increased ascites and omental caking, as discussed above. 2. The stomach itself is grossly unremarkable on today's examination. Electronically Signed   By: Vinnie Langton M.D.   On: 04/24/2016 14:18     Assessment  and plan- Patient is a 36 y.o. male with h/o metastatic gastric cancer currently on cyramza/ taxol  1. Counts are okay to proceed with next cycle of Cyramza on Taxol today. He does have mild leukopenia and his ANC is 1.2. However I will continue his treatment without any dose modifications today. He will be getting next week off from treatment when he will come for blood work. He will see Dr. Mike Gip in 2 weeks' time for the next cycle of Cyramza on Taxol.  2. Intermittent rectal bleeding: Patient states that this is trace blood tinged watery stools which are inferquent. H/H stable. Continue to monitor   3. Chemo induced peripheral neuropathy- gabapentin refilled today   Visit Diagnosis 1. Malignant neoplasm of body of stomach ([place])   2. Peritoneal carcinomatosis ([place])   3.  Chemotherapy-induced peripheral neuropathy ([place])   4. Encounter for antineoplastic chemotherapy      Dr. Randa Evens, MD, MPH [facility] at [facility] Pager- [phone] 05/15/2016 11:13 AM

## 2016-05-15 NOTE — Progress Notes (Signed)
Patient has had two treatments of Cyramza (Ramucirumab) and on Sunday evening had bleeding when he had a BM.  Patient states he went to ED.  Was told this is normal with the treatment he is getting.  He has had bleeding several times since.  Also complains of joint pain and headaches.  Patient requesting refill for Gabapentin for leg pain.

## 2016-05-16 ENCOUNTER — Other Ambulatory Visit: Payer: Self-pay | Admitting: *Deleted

## 2016-05-16 DIAGNOSIS — C801 Malignant (primary) neoplasm, unspecified: Principal | ICD-10-CM

## 2016-05-16 DIAGNOSIS — C786 Secondary malignant neoplasm of retroperitoneum and peritoneum: Secondary | ICD-10-CM

## 2016-05-19 ENCOUNTER — Telehealth: Payer: Self-pay | Admitting: *Deleted

## 2016-05-19 DIAGNOSIS — C801 Malignant (primary) neoplasm, unspecified: Secondary | ICD-10-CM

## 2016-05-19 DIAGNOSIS — C169 Malignant neoplasm of stomach, unspecified: Secondary | ICD-10-CM

## 2016-05-19 DIAGNOSIS — C786 Secondary malignant neoplasm of retroperitoneum and peritoneum: Secondary | ICD-10-CM

## 2016-05-19 MED ORDER — HYDROCODONE-ACETAMINOPHEN 5-325 MG PO TABS: 1.0000 | ORAL_TABLET | Freq: Four times a day (QID) | ORAL | 0 refills | Status: DC | PRN

## 2016-05-19 NOTE — Telephone Encounter (Signed)
Advised to pick uo  Rx in [place] office

## 2016-05-22 ENCOUNTER — Inpatient Hospital Stay: Payer: Self-pay

## 2016-05-26 ENCOUNTER — Telehealth: Payer: Self-pay | Admitting: *Deleted

## 2016-05-26 MED ORDER — FIRST-DUKES MOUTHWASH MT SUSP: 10.0000 mL | Freq: Four times a day (QID) | OROMUCOSAL | 3 refills | Status: DC | PRN

## 2016-05-26 NOTE — Telephone Encounter (Signed)
Called to report that he is having pain in his mouth, at his jaw "mouth sores" She has not looked in his mouth to see how it looks and he has gone out right now. He has been using warm salt water rinses, but it is very painful. Asking for something to be recommended for treating it. Please advise

## 2016-05-26 NOTE — Telephone Encounter (Signed)
  Magic mouthwash 5 cc swish and spit 4 times a day.  M

## 2016-05-29 ENCOUNTER — Encounter: Payer: Self-pay | Admitting: Hematology and Oncology

## 2016-05-29 ENCOUNTER — Inpatient Hospital Stay (HOSPITAL_BASED_OUTPATIENT_CLINIC_OR_DEPARTMENT_OTHER): Payer: Self-pay | Admitting: Hematology and Oncology

## 2016-05-29 ENCOUNTER — Other Ambulatory Visit: Payer: Self-pay

## 2016-05-29 ENCOUNTER — Inpatient Hospital Stay: Payer: Self-pay

## 2016-05-29 VITALS — BP 128/82 | HR 69 | Temp 97.2°F | Resp 18 | Wt 192.2 lb

## 2016-05-29 DIAGNOSIS — C162 Malignant neoplasm of body of stomach: Secondary | ICD-10-CM

## 2016-05-29 DIAGNOSIS — K625 Hemorrhage of anus and rectum: Secondary | ICD-10-CM

## 2016-05-29 DIAGNOSIS — D72819 Decreased white blood cell count, unspecified: Secondary | ICD-10-CM

## 2016-05-29 DIAGNOSIS — Z79899 Other long term (current) drug therapy: Secondary | ICD-10-CM

## 2016-05-29 DIAGNOSIS — G620 Drug-induced polyneuropathy: Secondary | ICD-10-CM

## 2016-05-29 DIAGNOSIS — C786 Secondary malignant neoplasm of retroperitoneum and peritoneum: Secondary | ICD-10-CM

## 2016-05-29 DIAGNOSIS — Z7189 Other specified counseling: Secondary | ICD-10-CM

## 2016-05-29 DIAGNOSIS — C801 Malignant (primary) neoplasm, unspecified: Secondary | ICD-10-CM

## 2016-05-29 DIAGNOSIS — K219 Gastro-esophageal reflux disease without esophagitis: Secondary | ICD-10-CM

## 2016-05-29 DIAGNOSIS — R188 Other ascites: Secondary | ICD-10-CM

## 2016-05-29 DIAGNOSIS — R202 Paresthesia of skin: Secondary | ICD-10-CM

## 2016-05-29 DIAGNOSIS — Z1509 Genetic susceptibility to other malignant neoplasm: Secondary | ICD-10-CM

## 2016-05-29 DIAGNOSIS — K259 Gastric ulcer, unspecified as acute or chronic, without hemorrhage or perforation: Secondary | ICD-10-CM

## 2016-05-29 DIAGNOSIS — R140 Abdominal distension (gaseous): Secondary | ICD-10-CM

## 2016-05-29 DIAGNOSIS — L509 Urticaria, unspecified: Secondary | ICD-10-CM

## 2016-05-29 DIAGNOSIS — Z5111 Encounter for antineoplastic chemotherapy: Secondary | ICD-10-CM

## 2016-05-29 DIAGNOSIS — E876 Hypokalemia: Secondary | ICD-10-CM

## 2016-05-29 DIAGNOSIS — D509 Iron deficiency anemia, unspecified: Secondary | ICD-10-CM

## 2016-05-29 LAB — CBC WITH DIFFERENTIAL/PLATELET
Basophils Absolute: 0 10*3/uL (ref 0–0.1)
Basophils Relative: 1 %
Eosinophils Absolute: 0 10*3/uL (ref 0–0.7)
Eosinophils Relative: 1 %
HCT: 40.6 % (ref 40.0–52.0)
Hemoglobin: 14.3 g/dL (ref 13.0–18.0)
Lymphocytes Relative: 39 %
Lymphs Abs: 1.2 10*3/uL (ref 1.0–3.6)
MCH: 29.8 pg (ref 26.0–34.0)
MCHC: 35.2 g/dL (ref 32.0–36.0)
MCV: 84.8 fL (ref 80.0–100.0)
Monocytes Absolute: 0.4 10*3/uL (ref 0.2–1.0)
Monocytes Relative: 13 %
Neutro Abs: 1.4 10*3/uL (ref 1.4–6.5)
Neutrophils Relative %: 46 %
Platelets: 137 10*3/uL — ABNORMAL LOW (ref 150–440)
RBC: 4.78 MIL/uL (ref 4.40–5.90)
RDW: 13.8 % (ref 11.5–14.5)
WBC: 3 10*3/uL — ABNORMAL LOW (ref 3.8–10.6)

## 2016-05-29 LAB — COMPREHENSIVE METABOLIC PANEL
ALT: 25 U/L (ref 17–63)
AST: 33 U/L (ref 15–41)
Albumin: 3.9 g/dL (ref 3.5–5.0)
Alkaline Phosphatase: 84 U/L (ref 38–126)
Anion gap: 6 (ref 5–15)
BUN: 9 mg/dL (ref 6–20)
CO2: 26 mmol/L (ref 22–32)
Calcium: 8.9 mg/dL (ref 8.9–10.3)
Chloride: 107 mmol/L (ref 101–111)
Creatinine, Ser: 0.71 mg/dL (ref 0.61–1.24)
GFR calc Af Amer: >60 mL/min (ref >60)
GFR calc non Af Amer: >60 mL/min (ref >60)
Glucose, Bld: 204 mg/dL — ABNORMAL HIGH (ref 65–99)
Potassium: 3.3 mmol/L — ABNORMAL LOW (ref 3.5–5.1)
Sodium: 139 mmol/L (ref 135–145)
Total Bilirubin: 0.7 mg/dL (ref 0.3–1.2)
Total Protein: 7.1 g/dL (ref 6.5–8.1)

## 2016-05-29 LAB — TSH: TSH: 4.233 u[IU]/mL (ref 0.350–4.500)

## 2016-05-29 LAB — MAGNESIUM: Magnesium: 1.9 mg/dL (ref 1.7–2.4)

## 2016-05-29 LAB — PROTEIN, URINE, RANDOM: Total Protein, Urine: 22 mg/dL

## 2016-05-29 MED ORDER — SODIUM CHLORIDE 0.9 % IV SOLN
8.0000 mg/kg | Freq: Once | INTRAVENOUS | Status: AC
  Administered 2016-05-29: 700 mg via INTRAVENOUS
  Filled 2016-05-29: qty 50

## 2016-05-29 MED ORDER — ACETAMINOPHEN 325 MG PO TABS
650.0000 mg | ORAL_TABLET | Freq: Once | ORAL | Status: AC
  Administered 2016-05-29: 650 mg via ORAL
  Filled 2016-05-29: qty 2

## 2016-05-29 MED ORDER — SODIUM CHLORIDE 0.9% FLUSH
10.0000 mL | Freq: Once | INTRAVENOUS | Status: AC
  Administered 2016-05-29: 10 mL via INTRAVENOUS
  Filled 2016-05-29: qty 10

## 2016-05-29 MED ORDER — SODIUM CHLORIDE 0.9 % IV SOLN
Freq: Once | INTRAVENOUS | Status: AC
  Administered 2016-05-29: 12:00:00 via INTRAVENOUS
  Filled 2016-05-29: qty 1000

## 2016-05-29 MED ORDER — PACLITAXEL CHEMO INJECTION 300 MG/50ML: 80.0000 mg/m2 | Freq: Once | INTRAVENOUS | Status: DC

## 2016-05-29 MED ORDER — HEPARIN SOD (PORK) LOCK FLUSH 100 UNIT/ML IV SOLN
500.0000 [IU] | Freq: Once | INTRAVENOUS | Status: AC | PRN
  Administered 2016-05-29: 500 [IU]

## 2016-05-29 MED ORDER — FAMOTIDINE IN NACL 20-0.9 MG/50ML-% IV SOLN
20.0000 mg | Freq: Once | INTRAVENOUS | Status: AC
  Administered 2016-05-29: 20 mg via INTRAVENOUS
  Filled 2016-05-29: qty 50

## 2016-05-29 MED ORDER — SODIUM CHLORIDE 0.9 % IV SOLN
80.0000 mg/m2 | Freq: Once | INTRAVENOUS | Status: AC
  Administered 2016-05-29: 162 mg via INTRAVENOUS
  Filled 2016-05-29: qty 27

## 2016-05-29 MED ORDER — DIPHENHYDRAMINE HCL 50 MG/ML IJ SOLN
50.0000 mg | Freq: Once | INTRAMUSCULAR | Status: AC
  Administered 2016-05-29: 50 mg via INTRAVENOUS
  Filled 2016-05-29: qty 1

## 2016-05-29 MED ORDER — POTASSIUM CHLORIDE ER 10 MEQ PO TBCR: EXTENDED_RELEASE_TABLET | ORAL | 1 refills | Status: DC

## 2016-05-29 MED ORDER — HEPARIN SOD (PORK) LOCK FLUSH 100 UNIT/ML IV SOLN
500.0000 [IU] | Freq: Once | INTRAVENOUS | Status: AC
  Filled 2016-05-29: qty 5

## 2016-05-29 MED ORDER — DEXAMETHASONE SODIUM PHOSPHATE 10 MG/ML IJ SOLN
10.0000 mg | Freq: Once | INTRAMUSCULAR | Status: AC
  Administered 2016-05-29: 10 mg via INTRAVENOUS
  Filled 2016-05-29: qty 1

## 2016-05-29 NOTE — Progress Notes (Signed)
[facility] day:  05/29/16  Chief Complaint: Anthony Melendez is a 36 y.o. male with metastatic gastric cancer who is seen for assessment prior to day 1 of cycle #2 ramucirumab (Cyramza) + Taxol.  HPI:  The patient was last seen in the medical oncology clinic by me on 05/01/2016.  At that time, his urticaria had almost cleared.  He had abdominal discomfort under his ribs (left > right) and in the left lower quadrant.  He felt full/bloated.  Exam revealed mild abdominal distention.  He began day 1 of cycle #1 ramucirumab and Taxol.  He saw Dr. Janese Banks in my absence on 05/15/2016.  He received day 15 treatment.  ANC was 1200.  During the interim, his abdominal pain dramatically improved.  He is more active.  His rash went away. He has a little tingling in his fingertips.   Past Medical History:  Diagnosis Date  . Gastric cancer ([facility]) 08/2015   Folfox chemo tx's.  Marland Kitchen GERD (gastroesophageal reflux disease)     Past Surgical History:  Procedure Laterality Date  . ESOPHAGOGASTRODUODENOSCOPY (EGD) WITH PROPOFOL N/A 08/02/2015   Procedure: ESOPHAGOGASTRODUODENOSCOPY (EGD) WITH PROPOFOL;  Surgeon: Lucilla Lame, MD;  Location: [facility];  Service: Endoscopy;  Laterality: N/A;  . ESOPHAGOGASTRODUODENOSCOPY (EGD) WITH PROPOFOL N/A 02/26/2016   Procedure: ESOPHAGOGASTRODUODENOSCOPY (EGD) WITH PROPOFOL;  Surgeon: Lucilla Lame, MD;  Location: [facility];  Service: Endoscopy;  Laterality: N/A;  . NO PAST SURGERIES    . PERIPHERAL VASCULAR CATHETERIZATION N/A 08/13/2015   Procedure: Glori Luis Cath Insertion;  Surgeon: Algernon Huxley, MD;  Location: [facility];  Service: Cardiovascular;  Laterality: N/A;    History reviewed. No pertinent family history.  Social History:  reports that he has never smoked. He has never used smokeless tobacco. He reports that he does not drink alcohol or use drugs.  The patient is from [place].  He speaks [place].  He  moves to the [place] in 2000.  He lives in [place] with his mother, father, brother and sister.  He is a Theme park manager.  The patient is accompanied by his mother, brother, and the interpreter today.   Allergies:  Allergies  Allergen Reactions  . Iodine     Possible anaphylaxis per MD     Current Medications: Current Outpatient Prescriptions  Medication Sig Dispense Refill  . Diphenhyd-Hydrocort-Nystatin (FIRST-DUKES MOUTHWASH) SUSP Use as directed 10 mLs in the mouth or throat 4 (four) times daily as needed. 480 mL 3  . ferrous sulfate 325 (65 FE) MG EC tablet Take 1 tablet (325 mg total) by mouth daily with breakfast. 30 tablet 3  . gabapentin (NEURONTIN) 100 MG capsule Take 1 capsule (100 mg total) by mouth 3 (three) times daily. 90 capsule 1  . HYDROcodone-acetaminophen (NORCO) 5-325 MG tablet Take 1 tablet by mouth every 6 (six) hours as needed for moderate pain. 20 tablet 0  . hydrOXYzine (ATARAX/VISTARIL) 25 MG tablet Take 25 mg by mouth daily.    Marland Kitchen levocetirizine (XYZAL) 5 MG tablet Take 5 mg by mouth every evening.    Marland Kitchen omeprazole (PRILOSEC) 20 MG capsule Take 1 capsule (20 mg total) by mouth 2 (two) times daily before a meal. 60 capsule 2  . ondansetron (ZOFRAN) 8 MG tablet Take 1 tablet (8 mg total) by mouth 2 (two) times daily as needed (Nausea or vomiting). 30 tablet 1  . potassium chloride (K-DUR) 10 MEQ tablet Take 1 tablet daily 30 tablet 0  .  prochlorperazine (COMPAZINE) 10 MG tablet Take 1 tablet (10 mg total) by mouth every 6 (six) hours as needed for nausea or vomiting. 30 tablet 1   No current facility-administered medications for this visit.    Facility-Administered Medications Ordered in Other Visits  Medication Dose Route Frequency Provider Last Rate Last Dose  . heparin lock flush 100 unit/mL  500 Units Intravenous Once Rosey Bath, MD      . heparin lock flush 100 unit/mL  500 Units Intravenous Once Rosey Bath, MD      . pegfilgrastim (NEULASTA)  injection 6 mg  6 mg Subcutaneous Once Rosey Bath, MD      . sodium chloride 0.9 % injection 10 mL  10 mL Intravenous Once Rosey Bath, MD      . sodium chloride flush (NS) 0.9 % injection 10 mL  10 mL Intravenous PRN Rosey Bath, MD   10 mL at 08/28/15 9909    Review of Systems:  GENERAL:  Feels "very good".  No fevers or sweats.  Weight down 3 pounds. PERFORMANCE STATUS (ECOG):  1 HEENT:  No visual changes, runny nose, sore throat, mouth sores or tenderness. Lungs:  No shortness of breath or cough.  No hemoptysis. Cardiac:  No chest pain, palpitations, orthopnea, or PND. GI:    Abdominal pain, improved.  No loner feels bloated.  No nausea, vomiting, diarrhea, constipation, melena or hematochezia. GU:  No urgency, frequency, dysuria, or hematuria. Musculoskeletal:  No bone pain.  No muscle tenderness.  Takes Claritin to prevent Neulasta induced bone pain. Extremities:  No pain or swelling. Skin:  Urticaria, resolved.  No rashes or ulcers. Neuro:  Little tingling in fingertips.  No headache, numbness or weakness, balance or coordination issues. Endocrine:  No diabetes, thyroid issues, hot flashes or night sweats. Psych:  No mood changes, depression or anxiety. Pain: No pain. Review of systems:  All other systems reviewed and found to be negative.  Physical Exam: Blood pressure 128/82, pulse 69, temperature 97.2 F (36.2 C), temperature source Tympanic, resp. rate 18, weight 192 lb 4 oz (87.2 kg). GENERAL:  Well developed, well nourished, gentleman sitting comfortably in the exam room in no acute distress. MENTAL STATUS:  Alert and oriented to person, place and time. HEAD:  Wearing a [employer] cap.  Black hair.  Thin mustache.  Normocephalic, atraumatic, face symmetric, no Cushingoid features. EYES:  Brown eyes.  No conjunctivitis or scleral icterus. ENT:  Oropharynx clear without lesion.  Tongue normal. Mucous membranes moist.  RESPIRATORY:  Clear to auscultation  without rales, wheezes or rhonchi. CARDIOVASCULAR:  Regular rate and rhythm without murmur, rub or gallop. ABDOMEN:  Soft, non-tender with active bowel sounds and no hepatosplenomegaly.  No masses. SKIN:  No urticaria.  No rashes or ulcers. EXTREMITIES: No edema, no skin discoloration or tenderness.  No palpable cords. LYMPH NODES: No palpable cervical, supraclavicular, axillary or inguinal adenopathy  NEUROLOGICAL: Unremarkable. PSYCH:  Appropriate. Smiling.  Imaging studies: 07/29/2015:  Chest, abdomen and pelvic CT revealed a 4 cm thickened and irregular gastric wall compatible with an infiltrative malignancy. There was extensive involvement of the upper mesentery and omentum compatible with peritoneal carcinomatosis. There was a small amount of ascites.    08/08/2015:  Head CT revealed no evidence of metastatic disease. 10/05/2015:  Abdomen and pelvic CT revealed a positive response to therapy with decreased size of the primary mass along the lesser curvature of the stomach, decrease extension of the mass into the gastrohepatic  ligament, decreasing evidence of peritoneal carcinomatosis involving predominantly the omentum, and resolution of previously noted malignant ascites. There was a potential ulcer along the greater curvature of the mid body of the stomach.   12/24/2015:  Abdomen and pelvic CT revealed further improvement in primary gastric malignancy and adjacent lymphadenopathy. There was no residual peritoneal carcinomatosis identified. 03/21/2016:  Abdominal CT revealed new small volume ascites within the abdomen extending over the liver and spleen.  There was increase soft tissue stranding throughout the peritoneal cavity worrisome for recurrence of peritoneal carcinomatosis. 04/24/2016:  Abdomen and pelvic CT revealed progressive intraperitoneal metastatic disease with increased ascites and omental caking.   Infusion on 05/29/2016  Component Date Value Ref Range Status  . WBC  05/29/2016 3.0* 3.8 - 10.6 K/uL Final  . RBC 05/29/2016 4.78  4.40 - 5.90 MIL/uL Final  . Hemoglobin 05/29/2016 14.3  13.0 - 18.0 g/dL Final  . HCT 05/29/2016 40.6  40.0 - 52.0 % Final  . MCV 05/29/2016 84.8  80.0 - 100.0 fL Final  . MCH 05/29/2016 29.8  26.0 - 34.0 pg Final  . MCHC 05/29/2016 35.2  32.0 - 36.0 g/dL Final  . RDW 05/29/2016 13.8  11.5 - 14.5 % Final  . Platelets 05/29/2016 137* 150 - 440 K/uL Final  . Neutrophils Relative % 05/29/2016 46  % Final  . Neutro Abs 05/29/2016 1.4  1.4 - 6.5 K/uL Final  . Lymphocytes Relative 05/29/2016 39  % Final  . Lymphs Abs 05/29/2016 1.2  1.0 - 3.6 K/uL Final  . Monocytes Relative 05/29/2016 13  % Final  . Monocytes Absolute 05/29/2016 0.4  0.2 - 1.0 K/uL Final  . Eosinophils Relative 05/29/2016 1  % Final  . Eosinophils Absolute 05/29/2016 0.0  0 - 0.7 K/uL Final  . Basophils Relative 05/29/2016 1  % Final  . Basophils Absolute 05/29/2016 0.0  0 - 0.1 K/uL Final  . Sodium 05/29/2016 139  135 - 145 mmol/L Final  . Potassium 05/29/2016 3.3* 3.5 - 5.1 mmol/L Final  . Chloride 05/29/2016 107  101 - 111 mmol/L Final  . CO2 05/29/2016 26  22 - 32 mmol/L Final  . Glucose, Bld 05/29/2016 204* 65 - 99 mg/dL Final  . BUN 05/29/2016 9  6 - 20 mg/dL Final  . Creatinine, Ser 05/29/2016 0.71  0.61 - 1.24 mg/dL Final  . Calcium 05/29/2016 8.9  8.9 - 10.3 mg/dL Final  . Total Protein 05/29/2016 7.1  6.5 - 8.1 g/dL Final  . Albumin 05/29/2016 3.9  3.5 - 5.0 g/dL Final  . AST 05/29/2016 33  15 - 41 U/L Final  . ALT 05/29/2016 25  17 - 63 U/L Final  . Alkaline Phosphatase 05/29/2016 84  38 - 126 U/L Final  . Total Bilirubin 05/29/2016 0.7  0.3 - 1.2 mg/dL Final  . GFR calc non Af Amer 05/29/2016 >60  >60 mL/min Final  . GFR calc Af Amer 05/29/2016 >60  >60 mL/min Final   Comment: (NOTE) The eGFR has been calculated using the CKD EPI equation. This calculation has not been validated in all clinical situations. eGFR's persistently <60 mL/min signify  possible Chronic Kidney Disease.   . Anion gap 05/29/2016 6  5 - 15 Final  . Magnesium 05/29/2016 1.9  1.7 - 2.4 mg/dL Final    Assessment:  Anthony Melendez is a 37 y.o. male with metastatic gastric cancer.  He presented with a 4-6 week history of progressive epigastric pain.  EGD on 08/02/2015 revealed a large,  ulcerated, non-circumferential mass with oozing and stigmata of recent bleeding in the entire examined stomach.  The mass involved the fundus down to the antrum.  Biopsies revealed poorly differentiated adenocarcinoma.  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  Her2/neu testing was negative.  Microsatellite testing was negative.  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  PDL-1 was expressed.  CEA was 0.3 on 07/30/2015.  Chest, abdomen and pelvic CT scan on 07/29/2015 revealed a 4 cm thickened and irregular gastric wall compatible with an infiltrative malignancy. There was extensive involvement of the upper mesentery and omentum compatible with peritoneal carcinomatosis. There was a small amount of ascites.     He completed treatment for H pylori  (began 08/09/2015).  Stool was negative for H pylori on 11/01/2015.  He has iron deficiency anemia likely gastric oozing and modest diet.  RBC are microcytic.  Labs on 08/21/2015 revealed a ferritin was 44 with an iron saturation 4% and a TIBC of 395.  Ferritin was 116 on 09/18/2015.  He received 12 cycles of FOLFOX chemotherapy (08/14/2015 - 02/05/2016).  Chemotherapy was held on 10/23/2015 secondary to thrombocytopenia (87,000).  Platelet count was 90,000 prior to cycle #11.  He has tolerated his chemotherapy well without diarrhea, mouth sores, or neuropathy.  He received 3 cycles of maintenance 5FU/LV (03/03/2016 - 04/08/2016).  He is s/p 1 cycle of ramucirumab +  Taxol (05/01/2016 - 05/29/2016).  He has a little tingling in his fingertips.  EGD on 02/26/2016 revealed one non-bleeding gastric ulcer.   The mass appeared smaller.  Biopsy revealed moderate chronic gastritis and edema, negative for H pylori, dysplasia and malignancy.  Abdomen and pelvic CT on 04/24/2016 revealed progressive intraperitoneal metastatic disease with increased ascites and omental caking.  He had urticaria of unclear etiology.  He is followed by dermatology.  He takes Zyrtec every morning and hydroxyzine at night.  He takes Xyzal 5 mg daily.  He was instructed to use mild soaps and not to use perfumes.   Symptomatically, he his abdominal pain has improved.  Exam is unremarkable.  He has mild hypokalemia (3.3).  Plan: 1.  Labs today:  CBC with diff, CMP, Mg. 2.  Day 2 of cycle #1 Taxol and ramucirumab today. 3.  Rx:  potassium 10 meq po q day. 4.  RTC in 1 week for labs (CBC with diff, CMP) and day 8 of cycle #2 Taxol. 5.  RTC in 2 weeks for MD assessment, labs (CBC with diff, CMP, Mg), and day 15 of cycle #2 Taxol and ramucirumab. 6.  RTC in 3 weeks for labs (CBC with diff). 7.  RTC in 4 weeks for MD assessment, labs (CBC with diff, CMP, Mg), and day 1 of cycle #3 Taxol and ramucirumab.   Lequita Asal, MD  05/29/2016, 10:43 AM

## 2016-05-29 NOTE — Progress Notes (Signed)
Patient offers no complaints today. 

## 2016-06-05 ENCOUNTER — Inpatient Hospital Stay: Payer: Self-pay | Attending: Hematology and Oncology

## 2016-06-05 ENCOUNTER — Other Ambulatory Visit: Payer: Self-pay | Admitting: Hematology and Oncology

## 2016-06-05 ENCOUNTER — Inpatient Hospital Stay: Payer: Self-pay

## 2016-06-05 VITALS — BP 122/84 | HR 79 | Temp 97.8°F | Resp 18

## 2016-06-05 DIAGNOSIS — C786 Secondary malignant neoplasm of retroperitoneum and peritoneum: Secondary | ICD-10-CM | POA: Insufficient documentation

## 2016-06-05 DIAGNOSIS — R718 Other abnormality of red blood cells: Secondary | ICD-10-CM

## 2016-06-05 DIAGNOSIS — Z7189 Other specified counseling: Secondary | ICD-10-CM

## 2016-06-05 DIAGNOSIS — K219 Gastro-esophageal reflux disease without esophagitis: Secondary | ICD-10-CM | POA: Insufficient documentation

## 2016-06-05 DIAGNOSIS — Z79899 Other long term (current) drug therapy: Secondary | ICD-10-CM | POA: Insufficient documentation

## 2016-06-05 DIAGNOSIS — R188 Other ascites: Secondary | ICD-10-CM | POA: Insufficient documentation

## 2016-06-05 DIAGNOSIS — Z5111 Encounter for antineoplastic chemotherapy: Secondary | ICD-10-CM | POA: Insufficient documentation

## 2016-06-05 DIAGNOSIS — B9681 Helicobacter pylori [H. pylori] as the cause of diseases classified elsewhere: Secondary | ICD-10-CM

## 2016-06-05 DIAGNOSIS — C801 Malignant (primary) neoplasm, unspecified: Secondary | ICD-10-CM

## 2016-06-05 DIAGNOSIS — K2970 Gastritis, unspecified, without bleeding: Secondary | ICD-10-CM

## 2016-06-05 DIAGNOSIS — C162 Malignant neoplasm of body of stomach: Secondary | ICD-10-CM

## 2016-06-05 DIAGNOSIS — K5900 Constipation, unspecified: Secondary | ICD-10-CM | POA: Insufficient documentation

## 2016-06-05 DIAGNOSIS — M2550 Pain in unspecified joint: Secondary | ICD-10-CM | POA: Insufficient documentation

## 2016-06-05 DIAGNOSIS — G8929 Other chronic pain: Secondary | ICD-10-CM | POA: Insufficient documentation

## 2016-06-05 DIAGNOSIS — D709 Neutropenia, unspecified: Secondary | ICD-10-CM | POA: Insufficient documentation

## 2016-06-05 DIAGNOSIS — D509 Iron deficiency anemia, unspecified: Secondary | ICD-10-CM | POA: Insufficient documentation

## 2016-06-05 DIAGNOSIS — M549 Dorsalgia, unspecified: Secondary | ICD-10-CM | POA: Insufficient documentation

## 2016-06-05 DIAGNOSIS — L509 Urticaria, unspecified: Secondary | ICD-10-CM | POA: Insufficient documentation

## 2016-06-05 DIAGNOSIS — E876 Hypokalemia: Secondary | ICD-10-CM

## 2016-06-05 DIAGNOSIS — Z1509 Genetic susceptibility to other malignant neoplasm: Secondary | ICD-10-CM | POA: Insufficient documentation

## 2016-06-05 DIAGNOSIS — C168 Malignant neoplasm of overlapping sites of stomach: Secondary | ICD-10-CM

## 2016-06-05 LAB — COMPREHENSIVE METABOLIC PANEL
ALT: 24 U/L (ref 17–63)
AST: 28 U/L (ref 15–41)
Albumin: 3.8 g/dL (ref 3.5–5.0)
Alkaline Phosphatase: 88 U/L (ref 38–126)
Anion gap: 6 (ref 5–15)
BUN: 9 mg/dL (ref 6–20)
CO2: 25 mmol/L (ref 22–32)
Calcium: 8.7 mg/dL — ABNORMAL LOW (ref 8.9–10.3)
Chloride: 106 mmol/L (ref 101–111)
Creatinine, Ser: 0.55 mg/dL — ABNORMAL LOW (ref 0.61–1.24)
GFR calc Af Amer: >60 mL/min (ref >60)
GFR calc non Af Amer: >60 mL/min (ref >60)
Glucose, Bld: 173 mg/dL — ABNORMAL HIGH (ref 65–99)
Potassium: 3.5 mmol/L (ref 3.5–5.1)
Sodium: 137 mmol/L (ref 135–145)
Total Bilirubin: 0.6 mg/dL (ref 0.3–1.2)
Total Protein: 6.9 g/dL (ref 6.5–8.1)

## 2016-06-05 LAB — CBC WITH DIFFERENTIAL/PLATELET
Basophils Absolute: 0 10*3/uL (ref 0–0.1)
Basophils Relative: 1 %
Eosinophils Absolute: 0 10*3/uL (ref 0–0.7)
Eosinophils Relative: 1 %
HCT: 37.8 % — ABNORMAL LOW (ref 40.0–52.0)
Hemoglobin: 13.4 g/dL (ref 13.0–18.0)
Lymphocytes Relative: 32 %
Lymphs Abs: 1.1 10*3/uL (ref 1.0–3.6)
MCH: 30 pg (ref 26.0–34.0)
MCHC: 35.5 g/dL (ref 32.0–36.0)
MCV: 84.4 fL (ref 80.0–100.0)
Monocytes Absolute: 0.2 10*3/uL (ref 0.2–1.0)
Monocytes Relative: 5 %
Neutro Abs: 2.2 10*3/uL (ref 1.4–6.5)
Neutrophils Relative %: 61 %
Platelets: 141 10*3/uL — ABNORMAL LOW (ref 150–440)
RBC: 4.48 MIL/uL (ref 4.40–5.90)
RDW: 13.3 % (ref 11.5–14.5)
WBC: 3.6 10*3/uL — ABNORMAL LOW (ref 3.8–10.6)

## 2016-06-05 MED ORDER — DIPHENHYDRAMINE HCL 50 MG/ML IJ SOLN
50.0000 mg | Freq: Once | INTRAMUSCULAR | Status: AC
  Administered 2016-06-05: 50 mg via INTRAVENOUS
  Filled 2016-06-05: qty 1

## 2016-06-05 MED ORDER — PACLITAXEL CHEMO INJECTION 300 MG/50ML
80.0000 mg/m2 | Freq: Once | INTRAVENOUS | Status: AC
  Administered 2016-06-05: 162 mg via INTRAVENOUS
  Filled 2016-06-05: qty 27

## 2016-06-05 MED ORDER — SODIUM CHLORIDE 0.9 % IV SOLN
Freq: Once | INTRAVENOUS | Status: AC
  Administered 2016-06-05: 09:00:00 via INTRAVENOUS
  Filled 2016-06-05: qty 1000

## 2016-06-05 MED ORDER — SODIUM CHLORIDE 0.9% FLUSH
10.0000 mL | INTRAVENOUS | Status: DC | PRN
  Administered 2016-06-05: 10 mL via INTRAVENOUS
  Filled 2016-06-05: qty 10

## 2016-06-05 MED ORDER — FAMOTIDINE IN NACL 20-0.9 MG/50ML-% IV SOLN
20.0000 mg | Freq: Once | INTRAVENOUS | Status: AC
  Administered 2016-06-05: 20 mg via INTRAVENOUS
  Filled 2016-06-05: qty 50

## 2016-06-05 MED ORDER — HEPARIN SOD (PORK) LOCK FLUSH 100 UNIT/ML IV SOLN
500.0000 [IU] | Freq: Once | INTRAVENOUS | Status: AC
  Administered 2016-06-05: 500 [IU] via INTRAVENOUS
  Filled 2016-06-05: qty 5

## 2016-06-05 MED ORDER — DEXAMETHASONE SODIUM PHOSPHATE 10 MG/ML IJ SOLN
10.0000 mg | Freq: Once | INTRAMUSCULAR | Status: AC
  Administered 2016-06-05: 10 mg via INTRAVENOUS
  Filled 2016-06-05: qty 1

## 2016-06-05 MED ORDER — SODIUM CHLORIDE 0.9 % IV SOLN: 10.0000 mg | Freq: Once | INTRAVENOUS | Status: DC

## 2016-06-11 ENCOUNTER — Telehealth: Payer: Self-pay | Admitting: *Deleted

## 2016-06-11 DIAGNOSIS — C169 Malignant neoplasm of stomach, unspecified: Secondary | ICD-10-CM

## 2016-06-11 DIAGNOSIS — C801 Malignant (primary) neoplasm, unspecified: Secondary | ICD-10-CM

## 2016-06-11 DIAGNOSIS — C786 Secondary malignant neoplasm of retroperitoneum and peritoneum: Secondary | ICD-10-CM

## 2016-06-11 MED ORDER — OXYCODONE HCL 10 MG PO TABS: 10.0000 mg | ORAL_TABLET | ORAL | 0 refills | Status: DC | PRN

## 2016-06-11 MED ORDER — OXYCODONE HCL 10 MG PO TABS: 10.0000 mg | ORAL_TABLET | Freq: Four times a day (QID) | ORAL | 0 refills | Status: DC | PRN

## 2016-06-11 NOTE — Progress Notes (Signed)
[facility]  Telephone:([phone]) [phone] [phone]) [phone]  ID: Anthony Melendez OB: [birth year]  MR#: [id number]  IDM#:[id number]  Patient Care Team: Patient, No Pcp Per as PCP - General (General Practice) Midge Minium, MD as Consulting Physician (Gastroenterology) Loleta Rose, MD as Attending Physician (Emergency Medicine)  Clinic day:  05/29/16  Chief Complaint: Anthony Melendez is a 36 y.o. male with metastatic gastric cancer who is seen for assessment prior to cycle 2, day 15 of ramucirumab (Cyramza) + Taxol.  HPI:  Patient continues to tolerate his treatments well. He continues to complain of chronic back pain as well as occasional joint pain. He continues to have abdominal discomfort. He has no neurologic complaints. He denies any recent fevers. He has a fair appetite, but denies weight loss. He has no chest pain or shortness of breath. He denies any nausea, vomiting, constipation, or diarrhea. He has no urinary complaints. Patient otherwise feels well and offers no further specific complaints.    REVIEW OF SYSTEMS:   Review of Systems  Constitutional: Positive for malaise/fatigue. Negative for fever and weight loss.  Respiratory: Negative.  Negative for cough and shortness of breath.   Cardiovascular: Negative.  Negative for chest pain and leg swelling.  Gastrointestinal: Positive for abdominal pain. Negative for constipation, nausea and vomiting.  Genitourinary: Negative.   Musculoskeletal: Positive for back pain and joint pain.  Skin: Negative.  Negative for rash.  Neurological: Positive for sensory change and weakness.  Psychiatric/Behavioral: Negative.  The patient is not nervous/anxious.     As per HPI. Otherwise, a complete review of systems is negative.  PAST MEDICAL HISTORY: Past Medical History:  Diagnosis Date  . Gastric cancer ([facility]) 08/2015   Folfox chemo tx's.  Marland Kitchen GERD (gastroesophageal reflux disease)     PAST SURGICAL HISTORY: Past  Surgical History:  Procedure Laterality Date  . ESOPHAGOGASTRODUODENOSCOPY (EGD) WITH PROPOFOL N/A 08/02/2015   Procedure: ESOPHAGOGASTRODUODENOSCOPY (EGD) WITH PROPOFOL;  Surgeon: Midge Minium, MD;  Location: [facility];  Service: Endoscopy;  Laterality: N/A;  . ESOPHAGOGASTRODUODENOSCOPY (EGD) WITH PROPOFOL N/A 02/26/2016   Procedure: ESOPHAGOGASTRODUODENOSCOPY (EGD) WITH PROPOFOL;  Surgeon: Midge Minium, MD;  Location: [facility];  Service: Endoscopy;  Laterality: N/A;  . NO PAST SURGERIES    . PERIPHERAL VASCULAR CATHETERIZATION N/A 08/13/2015   Procedure: Shelda Pal Cath Insertion;  Surgeon: Annice Needy, MD;  Location: [facility];  Service: Cardiovascular;  Laterality: N/A;    FAMILY HISTORY: No family history on file.  ADVANCED DIRECTIVES (Y/N):  N  HEALTH MAINTENANCE: Social History  Substance Use Topics  . Smoking status: Never Smoker  . Smokeless tobacco: Never Used  . Alcohol use No     Colonoscopy:  PAP:  Bone density:  Lipid panel:  Allergies  Allergen Reactions  . Iodine     Possible anaphylaxis per MD     Current Outpatient Prescriptions  Medication Sig Dispense Refill  . ferrous sulfate 325 (65 FE) MG EC tablet Take 1 tablet (325 mg total) by mouth daily with breakfast. 30 tablet 3  . omeprazole (PRILOSEC) 20 MG capsule Take 1 capsule (20 mg total) by mouth 2 (two) times daily before a meal. 60 capsule 2  . ondansetron (ZOFRAN) 8 MG tablet Take 1 tablet (8 mg total) by mouth 2 (two) times daily as needed (Nausea or vomiting). 30 tablet 1  . potassium chloride (K-DUR) 10 MEQ tablet Take 1 tablet daily 30 tablet 1  . prochlorperazine (COMPAZINE) 10 MG tablet Take 1 tablet (10  mg total) by mouth every 6 (six) hours as needed for nausea or vomiting. 30 tablet 1  . Diphenhyd-Hydrocort-Nystatin (FIRST-DUKES MOUTHWASH) SUSP Use as directed 10 mLs in the mouth or throat 4 (four) times daily as needed. (Patient not taking: Reported on 06/12/2016) 480 mL 3    . gabapentin (NEURONTIN) 100 MG capsule Take 1 capsule (100 mg total) by mouth 3 (three) times daily. (Patient not taking: Reported on 06/12/2016) 90 capsule 1  . hydrOXYzine (ATARAX/VISTARIL) 25 MG tablet Take 25 mg by mouth daily.    Marland Kitchen levocetirizine (XYZAL) 5 MG tablet Take 5 mg by mouth every evening.    . Oxycodone HCl 10 MG TABS Take 1 tablet (10 mg total) by mouth every 6 (six) hours as needed. (Patient not taking: Reported on 06/12/2016) 20 tablet 0   No current facility-administered medications for this visit.    Facility-Administered Medications Ordered in Other Visits  Medication Dose Route Frequency Provider Last Rate Last Dose  . heparin lock flush 100 unit/mL  500 Units Intravenous Once Corcoran, Melissa C, MD      . pegfilgrastim (NEULASTA) injection 6 mg  6 mg Subcutaneous Once Corcoran, Melissa C, MD      . sodium chloride 0.9 % injection 10 mL  10 mL Intravenous Once Corcoran, Melissa C, MD      . sodium chloride flush (NS) 0.9 % injection 10 mL  10 mL Intravenous PRN Lequita Asal, MD   10 mL at 08/28/15 0835  . sodium chloride flush (NS) 0.9 % injection 10 mL  10 mL Intracatheter PRN Nolon Stalls C, MD   10 mL at 06/12/16 0950    OBJECTIVE: Vitals:   06/12/16 1029  BP: 131/90  Pulse: 92  Resp: 20  Temp: 97.8 F (36.6 C)     Body mass index is 31.43 kg/m.    ECOG FS:1 - Symptomatic but completely ambulatory  General: Well-developed, well-nourished, no acute distress. Eyes: Pink conjunctiva, anicteric sclera. Lungs: Clear to auscultation bilaterally. Heart: Regular rate and rhythm. No rubs, murmurs, or gallops. Abdomen: Soft, nontender, nondistended. No organomegaly noted, normoactive bowel sounds. Musculoskeletal: No edema, cyanosis, or clubbing. Vertebrae nontender to palpation. Neuro: Alert, answering all questions appropriately. Cranial nerves grossly intact. Skin: No rashes or petechiae noted. Psych: Normal affect.   LAB RESULTS:  Lab Results   Component Value Date   NA 136 06/12/2016   K 3.3 (L) 06/12/2016   CL 105 06/12/2016   CO2 27 06/12/2016   GLUCOSE 185 (H) 06/12/2016   BUN 9 06/12/2016   CREATININE 0.75 06/12/2016   CALCIUM 8.6 (L) 06/12/2016   PROT 7.2 06/12/2016   ALBUMIN 3.8 06/12/2016   AST 35 06/12/2016   ALT 28 06/12/2016   ALKPHOS 85 06/12/2016   BILITOT 0.8 06/12/2016   GFRNONAA >60 06/12/2016   GFRAA >60 06/12/2016    Lab Results  Component Value Date   WBC 1.9 (L) 06/12/2016   NEUTROABS 0.8 (L) 06/12/2016   HGB 13.0 06/12/2016   HCT 37.2 (L) 06/12/2016   MCV 84.7 06/12/2016   PLT 129 (L) 06/12/2016     STUDIES: No results found.  Assessment:  Anthony Melendez is a 36 y.o. male with metastatic gastric cancer.  He presented with a 4-6 week history of progressive epigastric pain.  EGD on 08/02/2015 revealed a large, ulcerated, non-circumferential mass with oozing and stigmata of recent bleeding in the entire examined stomach.  The mass involved the fundus down to the antrum.  Biopsies revealed  poorly differentiated adenocarcinoma.  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  Her2/neu testing was negative.  Microsatellite testing was negative.  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  PDL-1 was expressed.  CEA was 0.3 on 07/30/2015.  Chest, abdomen and pelvic CT scan on 07/29/2015 revealed a 4 cm thickened and irregular gastric wall compatible with an infiltrative malignancy. There was extensive involvement of the upper mesentery and omentum compatible with peritoneal carcinomatosis. There was a small amount of ascites.     He completed treatment for H pylori  (began 08/09/2015).  Stool was negative for H pylori on 11/01/2015.  He has iron deficiency anemia likely gastric oozing and modest diet.  RBC are microcytic.  Labs on  08/21/2015 revealed a ferritin was 44 with an iron saturation 4% and a TIBC of 395.  Ferritin was 116 on 09/18/2015.  He received 12 cycles of FOLFOX chemotherapy (08/14/2015 - 02/05/2016).  Chemotherapy was held on 10/23/2015 secondary to thrombocytopenia (87,000).  Platelet count was 90,000 prior to cycle #11.  He has tolerated his chemotherapy well without diarrhea, mouth sores, or neuropathy.  He received 3 cycles of maintenance 5FU/LV (03/03/2016 - 04/08/2016).  He is s/p 1 cycle of ramucirumab + Taxol (05/01/2016 - 05/29/2016).  He has a little tingling in his fingertips.  EGD on 02/26/2016 revealed one non-bleeding gastric ulcer.   The mass appeared smaller.  Biopsy revealed moderate chronic gastritis and edema, negative for H pylori, dysplasia and malignancy.  Abdomen and pelvic CT on 04/24/2016 revealed progressive intraperitoneal metastatic disease with increased ascites and omental caking.  He had urticaria of unclear etiology.  He is followed by dermatology.  He takes Zyrtec every morning and hydroxyzine at night.  He takes Xyzal 5 mg daily.  He was instructed to use mild soaps and not to use perfumes.   Plan: 1.  Labs today. 2.  Proceed with cycle 2, day 15 of Taxol and ramucirumab today despite mild neutropenia. 3.  Continue oral potassium supplementation 4. Return to clinic in 1 week for laboratory work and then in 2 weeks for consideration of cycle 3, day 1 of Taxol and ramucirumab. 5. Neutropenia: Monitor. 6. Pain: Patient was given a refill of his oxycodone yesterday.   Patient expressed understanding and was in agreement with this plan. He also understands that He can call clinic at any time with any questions, concerns, or complaints.   Cancer Staging Malignant neoplasm of body of stomach ([place]) Staging form: Stomach, AJCC 6th Edition - Clinical: No stage assigned - Unsigned   Lloyd Huger, MD   06/12/2016 3:03 PM

## 2016-06-11 NOTE — Telephone Encounter (Signed)
Having pain in back and throat, back is bothering him worse. Hydrocodone is not controlling his pain. His last chemo was 4/26 and he sees TF tomorrow for lab/ md/ chemo. Please advise

## 2016-06-11 NOTE — Telephone Encounter (Signed)
Per Dr Grayland Ormond stop Hydrocodone, Oxycodone 10 mg every 6 hours prn @ 20 tabs. Anthony Melendez instructed to stop the Hydrocodone and to come pick up new prescription for Oxycodone. She repeated this back to me and said she will come and pick up prescription

## 2016-06-12 ENCOUNTER — Inpatient Hospital Stay: Payer: Self-pay

## 2016-06-12 ENCOUNTER — Inpatient Hospital Stay (HOSPITAL_BASED_OUTPATIENT_CLINIC_OR_DEPARTMENT_OTHER): Payer: Self-pay | Admitting: Oncology

## 2016-06-12 ENCOUNTER — Other Ambulatory Visit: Payer: Self-pay | Admitting: Hematology and Oncology

## 2016-06-12 VITALS — BP 131/90 | HR 92 | Temp 97.8°F | Resp 20 | Wt 188.9 lb

## 2016-06-12 DIAGNOSIS — C786 Secondary malignant neoplasm of retroperitoneum and peritoneum: Secondary | ICD-10-CM

## 2016-06-12 DIAGNOSIS — E876 Hypokalemia: Secondary | ICD-10-CM

## 2016-06-12 DIAGNOSIS — L509 Urticaria, unspecified: Secondary | ICD-10-CM

## 2016-06-12 DIAGNOSIS — G8929 Other chronic pain: Secondary | ICD-10-CM

## 2016-06-12 DIAGNOSIS — C162 Malignant neoplasm of body of stomach: Secondary | ICD-10-CM

## 2016-06-12 DIAGNOSIS — Z79899 Other long term (current) drug therapy: Secondary | ICD-10-CM

## 2016-06-12 DIAGNOSIS — C801 Malignant (primary) neoplasm, unspecified: Secondary | ICD-10-CM

## 2016-06-12 DIAGNOSIS — R188 Other ascites: Secondary | ICD-10-CM

## 2016-06-12 DIAGNOSIS — K219 Gastro-esophageal reflux disease without esophagitis: Secondary | ICD-10-CM

## 2016-06-12 DIAGNOSIS — Z7189 Other specified counseling: Secondary | ICD-10-CM

## 2016-06-12 DIAGNOSIS — Z1509 Genetic susceptibility to other malignant neoplasm: Secondary | ICD-10-CM

## 2016-06-12 DIAGNOSIS — Z5111 Encounter for antineoplastic chemotherapy: Secondary | ICD-10-CM

## 2016-06-12 DIAGNOSIS — D509 Iron deficiency anemia, unspecified: Secondary | ICD-10-CM

## 2016-06-12 DIAGNOSIS — M2550 Pain in unspecified joint: Secondary | ICD-10-CM

## 2016-06-12 DIAGNOSIS — M549 Dorsalgia, unspecified: Secondary | ICD-10-CM

## 2016-06-12 DIAGNOSIS — D709 Neutropenia, unspecified: Secondary | ICD-10-CM

## 2016-06-12 LAB — COMPREHENSIVE METABOLIC PANEL
ALT: 28 U/L (ref 17–63)
AST: 35 U/L (ref 15–41)
Albumin: 3.8 g/dL (ref 3.5–5.0)
Alkaline Phosphatase: 85 U/L (ref 38–126)
Anion gap: 4 — ABNORMAL LOW (ref 5–15)
BUN: 9 mg/dL (ref 6–20)
CO2: 27 mmol/L (ref 22–32)
Calcium: 8.6 mg/dL — ABNORMAL LOW (ref 8.9–10.3)
Chloride: 105 mmol/L (ref 101–111)
Creatinine, Ser: 0.75 mg/dL (ref 0.61–1.24)
GFR calc Af Amer: >60 mL/min (ref >60)
GFR calc non Af Amer: >60 mL/min (ref >60)
Glucose, Bld: 185 mg/dL — ABNORMAL HIGH (ref 65–99)
Potassium: 3.3 mmol/L — ABNORMAL LOW (ref 3.5–5.1)
Sodium: 136 mmol/L (ref 135–145)
Total Bilirubin: 0.8 mg/dL (ref 0.3–1.2)
Total Protein: 7.2 g/dL (ref 6.5–8.1)

## 2016-06-12 LAB — CBC WITH DIFFERENTIAL/PLATELET
Basophils Absolute: 0 10*3/uL (ref 0–0.1)
Basophils Relative: 2 %
Eosinophils Absolute: 0 10*3/uL (ref 0–0.7)
Eosinophils Relative: 2 %
HCT: 37.2 % — ABNORMAL LOW (ref 40.0–52.0)
Hemoglobin: 13 g/dL (ref 13.0–18.0)
Lymphocytes Relative: 46 %
Lymphs Abs: 0.9 10*3/uL — ABNORMAL LOW (ref 1.0–3.6)
MCH: 29.7 pg (ref 26.0–34.0)
MCHC: 35 g/dL (ref 32.0–36.0)
MCV: 84.7 fL (ref 80.0–100.0)
Monocytes Absolute: 0.1 10*3/uL — ABNORMAL LOW (ref 0.2–1.0)
Monocytes Relative: 7 %
Neutro Abs: 0.8 10*3/uL — ABNORMAL LOW (ref 1.4–6.5)
Neutrophils Relative %: 43 %
Platelets: 129 10*3/uL — ABNORMAL LOW (ref 150–440)
RBC: 4.39 MIL/uL — ABNORMAL LOW (ref 4.40–5.90)
RDW: 13.8 % (ref 11.5–14.5)
WBC: 1.9 10*3/uL — ABNORMAL LOW (ref 3.8–10.6)

## 2016-06-12 LAB — MAGNESIUM: Magnesium: 1.9 mg/dL (ref 1.7–2.4)

## 2016-06-12 MED ORDER — SODIUM CHLORIDE 0.9 % IV SOLN
Freq: Once | INTRAVENOUS | Status: AC
  Administered 2016-06-12: 12:00:00 via INTRAVENOUS
  Filled 2016-06-12: qty 1000

## 2016-06-12 MED ORDER — RAMUCIRUMAB CHEMO INJECTION 500 MG/50ML
8.0000 mg/kg | Freq: Once | INTRAVENOUS | Status: AC
  Administered 2016-06-12: 700 mg via INTRAVENOUS
  Filled 2016-06-12: qty 50

## 2016-06-12 MED ORDER — ACETAMINOPHEN 325 MG PO TABS
650.0000 mg | ORAL_TABLET | Freq: Once | ORAL | Status: AC
  Administered 2016-06-12: 650 mg via ORAL
  Filled 2016-06-12: qty 2

## 2016-06-12 MED ORDER — PACLITAXEL CHEMO INJECTION 300 MG/50ML
80.0000 mg/m2 | Freq: Once | INTRAVENOUS | Status: AC
  Administered 2016-06-12: 162 mg via INTRAVENOUS
  Filled 2016-06-12: qty 27

## 2016-06-12 MED ORDER — HEPARIN SOD (PORK) LOCK FLUSH 100 UNIT/ML IV SOLN
500.0000 [IU] | Freq: Once | INTRAVENOUS | Status: AC | PRN
  Administered 2016-06-12: 500 [IU]
  Filled 2016-06-12: qty 5

## 2016-06-12 MED ORDER — FAMOTIDINE IN NACL 20-0.9 MG/50ML-% IV SOLN
20.0000 mg | Freq: Once | INTRAVENOUS | Status: AC
Start: 2016-06-12 — End: 2016-06-12
  Administered 2016-06-12: 20 mg via INTRAVENOUS
  Filled 2016-06-12: qty 50

## 2016-06-12 MED ORDER — DIPHENHYDRAMINE HCL 50 MG/ML IJ SOLN
50.0000 mg | Freq: Once | INTRAMUSCULAR | Status: AC
  Administered 2016-06-12: 50 mg via INTRAVENOUS
  Filled 2016-06-12: qty 1

## 2016-06-12 MED ORDER — DEXAMETHASONE SODIUM PHOSPHATE 10 MG/ML IJ SOLN
10.0000 mg | Freq: Once | INTRAMUSCULAR | Status: AC
  Administered 2016-06-12: 10 mg via INTRAVENOUS
  Filled 2016-06-12: qty 1

## 2016-06-12 MED ORDER — SODIUM CHLORIDE 0.9% FLUSH
10.0000 mL | INTRAVENOUS | Status: DC | PRN
  Administered 2016-06-12: 10 mL
  Filled 2016-06-12: qty 10

## 2016-06-12 NOTE — Progress Notes (Signed)
Pt's ANC 0.8 and WBC 1.6. Spoke with Maximino Sarin, [place] about lab results. She confirmed labs with Dr Grayland Ormond and he stated okay to proceed with treatment.

## 2016-06-12 NOTE — Progress Notes (Signed)
Patient feels more weak and wants to sleep a lot, complains of back pain in mid upper area 4/10 needs refill on oxycodone.

## 2016-06-13 LAB — PROTEIN ELECTRO, RANDOM URINE
ALBUMIN ELP UR: 65.9 %
ALPHA-1-GLOBULIN, U: 2.7 %
ALPHA-2-GLOBULIN, U: 10.1 %
Beta Globulin, U: 15.3 %
Gamma Globulin, U: 6 %
Total Protein, Urine: 30.9 mg/dL

## 2016-06-16 ENCOUNTER — Telehealth: Payer: Self-pay | Admitting: *Deleted

## 2016-06-16 ENCOUNTER — Encounter: Payer: Self-pay | Admitting: Emergency Medicine

## 2016-06-16 DIAGNOSIS — Z85028 Personal history of other malignant neoplasm of stomach: Secondary | ICD-10-CM | POA: Insufficient documentation

## 2016-06-16 DIAGNOSIS — R1084 Generalized abdominal pain: Secondary | ICD-10-CM | POA: Insufficient documentation

## 2016-06-16 DIAGNOSIS — K5900 Constipation, unspecified: Secondary | ICD-10-CM | POA: Insufficient documentation

## 2016-06-16 LAB — COMPREHENSIVE METABOLIC PANEL
ALBUMIN: 3.7 g/dL (ref 3.5–5.0)
ALT: 26 U/L (ref 17–63)
ANION GAP: 6 (ref 5–15)
AST: 24 U/L (ref 15–41)
Alkaline Phosphatase: 69 U/L (ref 38–126)
BUN: 11 mg/dL (ref 6–20)
CO2: 30 mmol/L (ref 22–32)
Calcium: 8.5 mg/dL — ABNORMAL LOW (ref 8.9–10.3)
Chloride: 103 mmol/L (ref 101–111)
Creatinine, Ser: 0.88 mg/dL (ref 0.61–1.24)
GFR calc Af Amer: >60 mL/min (ref >60)
GFR calc non Af Amer: >60 mL/min (ref >60)
GLUCOSE: 159 mg/dL — AB (ref 65–99)
POTASSIUM: 3.6 mmol/L (ref 3.5–5.1)
Sodium: 139 mmol/L (ref 135–145)
TOTAL PROTEIN: 6.8 g/dL (ref 6.5–8.1)
Total Bilirubin: 0.8 mg/dL (ref 0.3–1.2)

## 2016-06-16 LAB — CBC
HEMATOCRIT: 38 % — AB (ref 40.0–52.0)
HEMOGLOBIN: 13 g/dL (ref 13.0–18.0)
MCH: 28.7 pg (ref 26.0–34.0)
MCHC: 34.3 g/dL (ref 32.0–36.0)
MCV: 83.5 fL (ref 80.0–100.0)
PLATELETS: 117 10*3/uL — AB (ref 150–440)
RBC: 4.55 MIL/uL (ref 4.40–5.90)
RDW: 14.1 % (ref 11.5–14.5)
WBC: 1.7 10*3/uL — ABNORMAL LOW (ref 3.8–10.6)

## 2016-06-16 LAB — URINALYSIS, COMPLETE (UACMP) WITH MICROSCOPIC
BILIRUBIN URINE: NEGATIVE
Bacteria, UA: NONE SEEN
GLUCOSE, UA: NEGATIVE mg/dL
Hgb urine dipstick: NEGATIVE
KETONES UR: NEGATIVE mg/dL
Leukocytes, UA: NEGATIVE
NITRITE: NEGATIVE
PH: 6 (ref 5.0–8.0)
Protein, ur: NEGATIVE mg/dL
RBC / HPF: NONE SEEN RBC/hpf (ref 0–5)
Specific Gravity, Urine: 1.011 (ref 1.005–1.030)
Squamous Epithelial / LPF: NONE SEEN

## 2016-06-16 LAB — LIPASE, BLOOD: LIPASE: 16 U/L (ref 11–51)

## 2016-06-16 NOTE — Telephone Encounter (Signed)
Verdis Frederickson (sister) called stating he is complaining of left sided abdominal pain and is unable to eat. Asking what can be done for him. He was changed from [place] to Oxycodone 06/11/16. Please advise

## 2016-06-16 NOTE — Telephone Encounter (Signed)
Patient was advised to go to ER for evaluation by Dr Mike Gip

## 2016-06-16 NOTE — ED Triage Notes (Signed)
Pt presents to ED with c/o abdominal pain x 2 days with loss of appetite. Pt reports hx of gastric cancer and states is currently on chemo treatment. Pt reports PCP sent pt to ED due to abdominal pain. Pt denies nausea, vomiting or diarrhea.

## 2016-06-17 ENCOUNTER — Emergency Department
Admission: EM | Admit: 2016-06-17 | Discharge: 2016-06-17 | Disposition: A | Discharge status: Home / Self Care | Payer: Self-pay | Attending: Emergency Medicine | Admitting: Emergency Medicine

## 2016-06-17 ENCOUNTER — Emergency Department: Payer: Self-pay

## 2016-06-17 DIAGNOSIS — K5900 Constipation, unspecified: Secondary | ICD-10-CM

## 2016-06-17 DIAGNOSIS — R1084 Generalized abdominal pain: Secondary | ICD-10-CM

## 2016-06-17 MED ORDER — SODIUM CHLORIDE 0.9 % IV BOLUS (SEPSIS)
500.0000 mL | Freq: Once | INTRAVENOUS | Status: AC
  Administered 2016-06-17: 500 mL via INTRAVENOUS

## 2016-06-17 MED ORDER — LACTULOSE 10 GM/15ML PO SOLN: 20.0000 g | Freq: Every day | ORAL | 0 refills | Status: DC | PRN

## 2016-06-17 MED ORDER — BARIUM SULFATE 2.1 % PO SUSP
450.0000 mL | ORAL | Status: AC
  Administered 2016-06-17 (×2): 450 mL via ORAL

## 2016-06-17 NOTE — Discharge Instructions (Signed)
1. Take laxative as prescribed (lactulose). 2. Drink plenty of fluids daily. 3. Return to the ER for worsening symptoms, persistent vomiting, difficulty breathing or other concerns.

## 2016-06-17 NOTE — ED Provider Notes (Signed)
[facility] Emergency Department Provider Note   ____________________________________________   First MD Initiated Contact with Patient 06/17/16 0136     (approximate)  I have reviewed the triage vital signs and the nursing notes.   HISTORY  Chief Complaint Abdominal Pain  History obtained via Spanish interpreter  HPI Anthony Melendez is a 36 y.o. male who presents to the ED from home with a chief complaint of abdominal pain. Patient has a history of metastatic gastric cancer, on chemotherapy, who was referred by his oncologist's to the ED for further evaluation of abdominal pain. Patient reports mainly left-sided abdominal pain which is worse than his baseline associated with loss of appetite. Denies fever, chills, chest pain, shortness of breath, nausea, vomiting or diarrhea. Reports last bowel movement 2-3 days ago which is unusual for him. His pain medicine was changed from [place] to oxycodone several days ago. States oxycodone sufficiently controls his pain. He was mildly neutropenic 5 days ago and underwent chemotherapy. Denies recent travel or trauma. Nothing makes his symptoms better or worse.   Past Medical History:  Diagnosis Date  . Gastric cancer ([place]) 08/2015   Folfox chemo tx's.  Marland Kitchen GERD (gastroesophageal reflux disease)     Patient Active Problem List   Diagnosis Date Noted  . Goals of care, counseling/discussion 05/29/2016  . Urticaria 04/22/2016  . 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 Chronic peptic ulcer of stomach   . Encounter for antineoplastic chemotherapy 01/15/2016  . 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 Chronic gastric ulcer 10/28/2015  . Hypocalcemia 09/30/2015  . Iron deficiency anemia 08/28/2015  . Microcytic red blood cells 08/14/2015  . Hypokalemia 08/14/2015  . Malignant neoplasm of body of stomach ([place]) 08/07/2015  . Helicobacter pylori gastritis 08/02/2015  . Abnormal findings-gastrointestinal  tract   . Neoplasm of digestive system   . Gastric wall thickening 08/01/2015  . 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 ESOPHAGOGASTRODUODENOSCOPY (EGD) WITH PROPOFOL N/A 08/02/2015   Procedure: ESOPHAGOGASTRODUODENOSCOPY (EGD) WITH PROPOFOL;  Surgeon: Lucilla Lame, MD;  Location: [place];  Service: Endoscopy;  Laterality: N/A;  . ESOPHAGOGASTRODUODENOSCOPY (EGD) WITH PROPOFOL N/A 02/26/2016   Procedure: ESOPHAGOGASTRODUODENOSCOPY (EGD) WITH PROPOFOL;  Surgeon: Lucilla Lame, MD;  Location: [facility] ENDOSCOPY;  Service: Endoscopy;  Laterality: N/A;  . NO PAST SURGERIES    . PERIPHERAL VASCULAR CATHETERIZATION N/A 08/13/2015   Procedure: Glori Luis Cath Insertion;  Surgeon: Algernon Huxley, MD;  Location: [facility] [facility];  Service: Cardiovascular;  Laterality: N/A;    Prior to Admission medications   Medication Sig Start Date End Date Taking? Authorizing Provider  Diphenhyd-Hydrocort-Nystatin (FIRST-DUKES MOUTHWASH) SUSP Use as directed 10 mLs in the mouth or throat 4 (four) times daily as needed. Patient not taking: Reported on 06/12/2016 05/26/16   Lequita Asal, MD  ferrous sulfate 325 (65 FE) MG EC tablet Take 1 tablet (325 mg total) by mouth daily with breakfast. 08/28/15   Lequita Asal, MD  gabapentin (NEURONTIN) 100 MG capsule Take 1 capsule (100 mg total) by mouth 3 (three) times daily. Patient not taking: Reported on 06/12/2016 05/15/16 05/15/17  Sindy Guadeloupe, MD  hydrOXYzine (ATARAX/VISTARIL) 25 MG tablet Take 25 mg by mouth daily.    [provider]  levocetirizine (XYZAL) 5 MG tablet Take 5 mg by mouth every evening.    [provider]  omeprazole (PRILOSEC) 20 MG capsule Take 1 capsule (20 mg total) by mouth 2 (two)  times daily before a meal. 03/26/16   Corcoran, Drue Second, MD  ondansetron (ZOFRAN) 8 MG tablet Take 1 tablet (8 mg total) by mouth 2 (two) times daily as needed (Nausea or  vomiting). 05/01/16   Lequita Asal, MD  Oxycodone HCl 10 MG TABS Take 1 tablet (10 mg total) by mouth every 6 (six) hours as needed. Patient not taking: Reported on 06/12/2016 06/11/16   Lloyd Huger, MD  potassium chloride (K-DUR) 10 MEQ tablet Take 1 tablet daily 05/29/16   Lequita Asal, MD  prochlorperazine (COMPAZINE) 10 MG tablet Take 1 tablet (10 mg total) by mouth every 6 (six) hours as needed for nausea or vomiting. 02/05/16   Lequita Asal, MD    Allergies Iodine  No family history on file.  Social History Social History  Substance Use Topics  . Smoking status: Never Smoker  . Smokeless tobacco: Never Used  . Alcohol use No    Review of Systems  Constitutional: No fever/chills. Eyes: No visual changes. ENT: No sore throat. Cardiovascular: Denies chest pain. Respiratory: Denies shortness of breath. Gastrointestinal: Positive for abdominal pain.  No nausea, no vomiting.  No diarrhea.  Positive for constipation. Genitourinary: Negative for dysuria. Musculoskeletal: Negative for back pain. Skin: Negative for rash. Neurological: Negative for headaches, focal weakness or numbness.   ____________________________________________   PHYSICAL EXAM:  VITAL SIGNS: ED Triage Vitals [06/16/16 2150]  Enc Vitals Group     BP 109/84     Pulse Rate 96     Resp 18     Temp 99.5 F (37.5 C)     Temp Source Oral     SpO2 96 %     Weight 188 lb (85.3 kg)     Height '5\' 5"'$  (1.651 m)     Head Circumference      Peak Flow      Pain Score 2     Pain Loc      Pain Edu?      Excl. in [place]?     Constitutional: Alert and oriented. Chronically ill appearing and in mild acute distress. Eyes: Conjunctivae are normal. PERRL. EOMI. Head: Atraumatic. Nose: No congestion/rhinnorhea. Mouth/Throat: Mucous membranes are moist.  Oropharynx non-erythematous. Neck: No stridor.   Cardiovascular: Normal rate, regular rhythm. Grossly normal heart sounds.  Good peripheral  circulation. Respiratory: Normal respiratory effort.  No retractions. Lungs CTAB. Gastrointestinal: Soft and mildly diffusely tender to palpation without rebound or guarding. No distention. No abdominal bruits. No CVA tenderness. Musculoskeletal: No lower extremity tenderness nor edema.  No joint effusions. Neurologic:  Normal speech and language. No gross focal neurologic deficits are appreciated. No gait instability. Skin:  Skin is warm, dry and intact. No rash noted. Psychiatric: Mood and affect are normal. Speech and behavior are normal.  ____________________________________________   LABS (all labs ordered are listed, but only abnormal results are displayed)  Labs Reviewed  COMPREHENSIVE METABOLIC PANEL - Abnormal; Notable for the following:       Result Value   Glucose, Bld 159 (*)    Calcium 8.5 (*)    All other components within normal limits  CBC - Abnormal; Notable for the following:    WBC 1.7 (*)    HCT 38.0 (*)    Platelets 117 (*)    All other components within normal limits  URINALYSIS, COMPLETE (UACMP) WITH MICROSCOPIC - Abnormal; Notable for the following:    Color, Urine YELLOW (*)    APPearance CLEAR (*)  All other components within normal limits  LIPASE, BLOOD   ____________________________________________  EKG  None ____________________________________________  RADIOLOGY  CT abdomen/pelvis interpreted per Dr. Collins [place]: 1. Decreased omental metastatic disease compared to the CT of  04/24/2016. The volume of ascites is also decreased.  2. No acute abdominal or pelvic abnormality.   ____________________________________________   PROCEDURES  Procedure(s) performed: None  Procedures  Critical Care performed: No  ____________________________________________   INITIAL IMPRESSION / ASSESSMENT AND PLAN / ED COURSE  Pertinent labs & imaging results that were available during my care of the patient were reviewed by me and considered in my medical  decision making (see chart for details).  36 year old male with metastatic gastric cancer on chemotherapy who presents with constipation and abdominal pain. Laboratory urinalysis results unremarkable except for neutropenia which is not significantly changed from 5 days ago. Patient declines analgesia at this time. Will proceed with CT abdomen/pelvis to evaluate intra-abdominal etiology.  Clinical Course as of Jun 18 530  Tue Jun 17, 2016  0454 Patient sleeping in no acute distress. Updated patient and family members of CT imaging results. We'll discharge home with prescription for lactulose and close oncology follow-up. Strict return precautions given. All verbalize understanding and agree with plan of care.  [JS]    Clinical Course User Index [JS] Paulette Blanch, MD     ____________________________________________   FINAL CLINICAL IMPRESSION(S) / ED DIAGNOSES  Final diagnoses:  Generalized abdominal pain  Constipation, unspecified constipation type      NEW MEDICATIONS STARTED DURING THIS VISIT:  New Prescriptions   No medications on file     Note:  This document was prepared using Dragon voice recognition software and may include unintentional dictation errors.    Paulette Blanch, MD 06/17/16 [phone]

## 2016-06-19 ENCOUNTER — Inpatient Hospital Stay: Payer: Self-pay

## 2016-06-19 DIAGNOSIS — C801 Malignant (primary) neoplasm, unspecified: Secondary | ICD-10-CM

## 2016-06-19 DIAGNOSIS — Z5111 Encounter for antineoplastic chemotherapy: Secondary | ICD-10-CM

## 2016-06-19 DIAGNOSIS — C786 Secondary malignant neoplasm of retroperitoneum and peritoneum: Secondary | ICD-10-CM

## 2016-06-19 DIAGNOSIS — Z7189 Other specified counseling: Secondary | ICD-10-CM

## 2016-06-19 DIAGNOSIS — C162 Malignant neoplasm of body of stomach: Secondary | ICD-10-CM

## 2016-06-19 DIAGNOSIS — E876 Hypokalemia: Secondary | ICD-10-CM

## 2016-06-19 LAB — CBC WITH DIFFERENTIAL/PLATELET
Basophils Absolute: 0 10*3/uL (ref 0–0.1)
Basophils Relative: 1 %
Eosinophils Absolute: 0 10*3/uL (ref 0–0.7)
Eosinophils Relative: 2 %
HCT: 37.6 % — ABNORMAL LOW (ref 40.0–52.0)
Hemoglobin: 13.4 g/dL (ref 13.0–18.0)
Lymphocytes Relative: 38 %
Lymphs Abs: 0.8 10*3/uL — ABNORMAL LOW (ref 1.0–3.6)
MCH: 29.8 pg (ref 26.0–34.0)
MCHC: 35.7 g/dL (ref 32.0–36.0)
MCV: 83.7 fL (ref 80.0–100.0)
Monocytes Absolute: 0.1 10*3/uL — ABNORMAL LOW (ref 0.2–1.0)
Monocytes Relative: 7 %
Neutro Abs: 1.1 10*3/uL — ABNORMAL LOW (ref 1.4–6.5)
Neutrophils Relative %: 52 %
Platelets: 192 10*3/uL (ref 150–440)
RBC: 4.49 MIL/uL (ref 4.40–5.90)
RDW: 14 % (ref 11.5–14.5)
WBC: 2.1 10*3/uL — ABNORMAL LOW (ref 3.8–10.6)

## 2016-06-26 ENCOUNTER — Encounter: Payer: Self-pay | Admitting: Hematology and Oncology

## 2016-06-26 ENCOUNTER — Inpatient Hospital Stay: Payer: Self-pay

## 2016-06-26 ENCOUNTER — Other Ambulatory Visit: Payer: Self-pay | Admitting: Hematology and Oncology

## 2016-06-26 ENCOUNTER — Inpatient Hospital Stay (HOSPITAL_BASED_OUTPATIENT_CLINIC_OR_DEPARTMENT_OTHER): Payer: 59 | Admitting: Hematology and Oncology

## 2016-06-26 VITALS — BP 124/84 | HR 70 | Resp 18 | Wt 184.4 lb

## 2016-06-26 DIAGNOSIS — E876 Hypokalemia: Secondary | ICD-10-CM

## 2016-06-26 DIAGNOSIS — R188 Other ascites: Secondary | ICD-10-CM

## 2016-06-26 DIAGNOSIS — Z79899 Other long term (current) drug therapy: Secondary | ICD-10-CM | POA: Diagnosis not present

## 2016-06-26 DIAGNOSIS — K5900 Constipation, unspecified: Secondary | ICD-10-CM | POA: Diagnosis not present

## 2016-06-26 DIAGNOSIS — D709 Neutropenia, unspecified: Secondary | ICD-10-CM

## 2016-06-26 DIAGNOSIS — G8929 Other chronic pain: Secondary | ICD-10-CM

## 2016-06-26 DIAGNOSIS — C162 Malignant neoplasm of body of stomach: Secondary | ICD-10-CM

## 2016-06-26 DIAGNOSIS — Z1509 Genetic susceptibility to other malignant neoplasm: Secondary | ICD-10-CM

## 2016-06-26 DIAGNOSIS — M549 Dorsalgia, unspecified: Secondary | ICD-10-CM

## 2016-06-26 DIAGNOSIS — Z5111 Encounter for antineoplastic chemotherapy: Secondary | ICD-10-CM

## 2016-06-26 DIAGNOSIS — C786 Secondary malignant neoplasm of retroperitoneum and peritoneum: Secondary | ICD-10-CM

## 2016-06-26 DIAGNOSIS — K219 Gastro-esophageal reflux disease without esophagitis: Secondary | ICD-10-CM | POA: Diagnosis not present

## 2016-06-26 DIAGNOSIS — C801 Malignant (primary) neoplasm, unspecified: Secondary | ICD-10-CM

## 2016-06-26 DIAGNOSIS — M2550 Pain in unspecified joint: Secondary | ICD-10-CM

## 2016-06-26 DIAGNOSIS — D509 Iron deficiency anemia, unspecified: Secondary | ICD-10-CM

## 2016-06-26 DIAGNOSIS — Z7189 Other specified counseling: Secondary | ICD-10-CM

## 2016-06-26 DIAGNOSIS — L509 Urticaria, unspecified: Secondary | ICD-10-CM

## 2016-06-26 LAB — CBC WITH DIFFERENTIAL/PLATELET
Basophils Absolute: 0.1 10*3/uL (ref 0–0.1)
Basophils Relative: 2 %
Eosinophils Absolute: 0 10*3/uL (ref 0–0.7)
Eosinophils Relative: 1 %
HCT: 37.4 % — ABNORMAL LOW (ref 40.0–52.0)
Hemoglobin: 13.1 g/dL (ref 13.0–18.0)
Lymphocytes Relative: 42 %
Lymphs Abs: 1.4 10*3/uL (ref 1.0–3.6)
MCH: 29.2 pg (ref 26.0–34.0)
MCHC: 35 g/dL (ref 32.0–36.0)
MCV: 83.5 fL (ref 80.0–100.0)
Monocytes Absolute: 0.5 10*3/uL (ref 0.2–1.0)
Monocytes Relative: 15 %
Neutro Abs: 1.3 10*3/uL — ABNORMAL LOW (ref 1.4–6.5)
Neutrophils Relative %: 40 %
Platelets: 192 10*3/uL (ref 150–440)
RBC: 4.48 MIL/uL (ref 4.40–5.90)
RDW: 15.1 % — ABNORMAL HIGH (ref 11.5–14.5)
WBC: 3.3 10*3/uL — ABNORMAL LOW (ref 3.8–10.6)

## 2016-06-26 LAB — COMPREHENSIVE METABOLIC PANEL
ALT: 19 U/L (ref 17–63)
AST: 26 U/L (ref 15–41)
Albumin: 3.6 g/dL (ref 3.5–5.0)
Alkaline Phosphatase: 74 U/L (ref 38–126)
Anion gap: 5 (ref 5–15)
BUN: 9 mg/dL (ref 6–20)
CO2: 27 mmol/L (ref 22–32)
Calcium: 8.4 mg/dL — ABNORMAL LOW (ref 8.9–10.3)
Chloride: 106 mmol/L (ref 101–111)
Creatinine, Ser: 0.75 mg/dL (ref 0.61–1.24)
GFR calc Af Amer: >60 mL/min (ref >60)
GFR calc non Af Amer: >60 mL/min (ref >60)
Glucose, Bld: 99 mg/dL (ref 65–99)
Potassium: 3.5 mmol/L (ref 3.5–5.1)
Sodium: 138 mmol/L (ref 135–145)
Total Bilirubin: 0.7 mg/dL (ref 0.3–1.2)
Total Protein: 7 g/dL (ref 6.5–8.1)

## 2016-06-26 LAB — MAGNESIUM: Magnesium: 2.1 mg/dL (ref 1.7–2.4)

## 2016-06-26 LAB — PROTEIN, URINE, RANDOM: Total Protein, Urine: 24 mg/dL

## 2016-06-26 MED ORDER — SODIUM CHLORIDE 0.9% FLUSH
10.0000 mL | INTRAVENOUS | Status: DC | PRN
  Administered 2016-06-26 (×2): 10 mL via INTRAVENOUS
  Filled 2016-06-26: qty 10

## 2016-06-26 MED ORDER — PACLITAXEL CHEMO INJECTION 300 MG/50ML
80.0000 mg/m2 | Freq: Once | INTRAVENOUS | Status: AC
  Administered 2016-06-26: 162 mg via INTRAVENOUS
  Filled 2016-06-26: qty 27

## 2016-06-26 MED ORDER — HEPARIN SOD (PORK) LOCK FLUSH 100 UNIT/ML IV SOLN
500.0000 [IU] | Freq: Once | INTRAVENOUS | Status: AC
  Administered 2016-06-26: 500 [IU] via INTRAVENOUS
  Filled 2016-06-26: qty 5

## 2016-06-26 MED ORDER — DIPHENHYDRAMINE HCL 50 MG/ML IJ SOLN
50.0000 mg | Freq: Once | INTRAMUSCULAR | Status: AC
  Administered 2016-06-26: 50 mg via INTRAVENOUS
  Filled 2016-06-26: qty 1

## 2016-06-26 MED ORDER — SODIUM CHLORIDE 0.9 % IV SOLN
8.0000 mg/kg | Freq: Once | INTRAVENOUS | Status: AC
  Administered 2016-06-26: 700 mg via INTRAVENOUS
  Filled 2016-06-26: qty 50

## 2016-06-26 MED ORDER — DEXAMETHASONE SODIUM PHOSPHATE 10 MG/ML IJ SOLN
10.0000 mg | Freq: Once | INTRAMUSCULAR | Status: AC
Start: 2016-06-26 — End: 2016-06-26
  Administered 2016-06-26: 10 mg via INTRAVENOUS
  Filled 2016-06-26: qty 1

## 2016-06-26 MED ORDER — SODIUM CHLORIDE 0.9 % IV SOLN
Freq: Once | INTRAVENOUS | Status: AC
  Administered 2016-06-26: 10:00:00 via INTRAVENOUS
  Filled 2016-06-26: qty 1000

## 2016-06-26 MED ORDER — FAMOTIDINE IN NACL 20-0.9 MG/50ML-% IV SOLN
20.0000 mg | Freq: Once | INTRAVENOUS | Status: AC
  Administered 2016-06-26: 20 mg via INTRAVENOUS
  Filled 2016-06-26: qty 50

## 2016-06-26 MED ORDER — ACETAMINOPHEN 325 MG PO TABS
650.0000 mg | ORAL_TABLET | Freq: Once | ORAL | Status: AC
  Administered 2016-06-26: 650 mg via ORAL
  Filled 2016-06-26: qty 2

## 2016-06-26 NOTE — Progress Notes (Signed)
Patient states he feels fatigued.  His appetite is decreased.  He has been having nausea.  Denies any pain today.

## 2016-06-26 NOTE — Progress Notes (Signed)
[facility] day:  06/26/16  Chief Complaint: Sherill Wegener is a 36 y.o. male with metastatic gastric cancer who is seen for assessment prior to day 1 of cycle #3 ramucirumab (Cyramza) + Taxol.  HPI:  The patient was last seen in the medical oncology clinic by me on 05/29/2016.  At that time, his abdominal pain had dramatically improved.  He was more active.  His rash went away.  He had a little tingling in his fingertips.  He began day 1 of cycle #2.  He saw Dr. Grayland Ormond in my absence on 05/102018.  He noted chronic back pain as well as occasional joint pain. He continued to have abdominal discomfort. He had no neurologic complaints.  He received day 15 treatment.  ANC was 800.  He was seen in the ER on 06/16/2016 secondary to abdominal pain and decreased oral intake.  Abdominal and pelvic CT scan on 06/17/2016 revealed decreased omental metastatic disease compared to the CT of 04/24/2016.  The volume of ascites was also decreased.  There was no acute abdominal or pelvic abnormality.  Pain was well managed with oxycodone.  He was felt to have constipation.  He was prescribed lactulose.  Symptomatically, he feels good.  He denies any nausea, vomiting or abdominal pain.  He denies any neuropathy.  He notes a change in taste sensation.  He has some chronic mild knee pain.   Past Medical History:  Diagnosis Date  . Gastric cancer ([place]) 08/2015   Folfox chemo tx's.  Marland Kitchen GERD (gastroesophageal reflux disease)     Past Surgical History:  Procedure Laterality Date  . ESOPHAGOGASTRODUODENOSCOPY (EGD) WITH PROPOFOL N/A 08/02/2015   Procedure: ESOPHAGOGASTRODUODENOSCOPY (EGD) WITH PROPOFOL;  Surgeon: Lucilla Lame, MD;  Location: [facility];  Service: Endoscopy;  Laterality: N/A;  . ESOPHAGOGASTRODUODENOSCOPY (EGD) WITH PROPOFOL N/A 02/26/2016   Procedure: ESOPHAGOGASTRODUODENOSCOPY (EGD) WITH PROPOFOL;  Surgeon: Lucilla Lame, MD;  Location: [facility]  ENDOSCOPY;  Service: Endoscopy;  Laterality: N/A;  . NO PAST SURGERIES    . PERIPHERAL VASCULAR CATHETERIZATION N/A 08/13/2015   Procedure: Glori Luis Cath Insertion;  Surgeon: Algernon Huxley, MD;  Location: [facility];  Service: Cardiovascular;  Laterality: N/A;    History reviewed. No pertinent family history.  Social History:  reports that he has never smoked. He has never used smokeless tobacco. He reports that he does not drink alcohol or use drugs.  The patient is from [place].  He speaks [place].  He moves to the [place] in 2000.  He lives in [place] with his mother, father, brother and sister.  He is a Theme park manager.  The patient is accompanied by his brother and the interpreter today.   Allergies:  Allergies  Allergen Reactions  . Iodine     Possible anaphylaxis per MD     Current Medications: Current Outpatient Prescriptions  Medication Sig Dispense Refill  . ferrous sulfate 325 (65 FE) MG EC tablet Take 1 tablet (325 mg total) by mouth daily with breakfast. 30 tablet 3  . hydrOXYzine (ATARAX/VISTARIL) 25 MG tablet Take 25 mg by mouth daily.    Marland Kitchen lactulose (CHRONULAC) 10 GM/15ML solution Take 30 mLs (20 g total) by mouth daily as needed for mild constipation. 120 mL 0  . levocetirizine (XYZAL) 5 MG tablet Take 5 mg by mouth every evening.    Marland Kitchen omeprazole (PRILOSEC) 20 MG capsule Take 1 capsule (20 mg total) by mouth 2 (two) times daily before a meal.  60 capsule 2  . ondansetron (ZOFRAN) 8 MG tablet Take 1 tablet (8 mg total) by mouth 2 (two) times daily as needed (Nausea or vomiting). 30 tablet 1  . potassium chloride (K-DUR) 10 MEQ tablet Take 1 tablet daily 30 tablet 1  . prochlorperazine (COMPAZINE) 10 MG tablet Take 1 tablet (10 mg total) by mouth every 6 (six) hours as needed for nausea or vomiting. 30 tablet 1  . Diphenhyd-Hydrocort-Nystatin (FIRST-DUKES MOUTHWASH) SUSP Use as directed 10 mLs in the mouth or throat 4 (four) times daily as needed. (Patient not taking:  Reported on 06/12/2016) 480 mL 3  . gabapentin (NEURONTIN) 100 MG capsule Take 1 capsule (100 mg total) by mouth 3 (three) times daily. (Patient not taking: Reported on 06/12/2016) 90 capsule 1  . Oxycodone HCl 10 MG TABS Take 1 tablet (10 mg total) by mouth every 6 (six) hours as needed. (Patient not taking: Reported on 06/12/2016) 20 tablet 0   No current facility-administered medications for this visit.    Facility-Administered Medications Ordered in Other Visits  Medication Dose Route Frequency Provider Last Rate Last Dose  . heparin lock flush 100 unit/mL  500 Units Intravenous Once Tarren Velardi C, MD      . heparin lock flush 100 unit/mL  500 Units Intravenous Once Briceson Broadwater C, MD      . pegfilgrastim (NEULASTA) injection 6 mg  6 mg Subcutaneous Once Elianne Gubser C, MD      . sodium chloride 0.9 % injection 10 mL  10 mL Intravenous Once Barry Faircloth C, MD      . sodium chloride flush (NS) 0.9 % injection 10 mL  10 mL Intravenous PRN Lequita Asal, MD   10 mL at 08/28/15 0835  . sodium chloride flush (NS) 0.9 % injection 10 mL  10 mL Intravenous PRN Lequita Asal, MD   10 mL at 06/26/16 0850    Review of Systems:  GENERAL:  Feels "good".  No fevers or sweats.  Weight down 8 pounds. PERFORMANCE STATUS (ECOG):  1 HEENT:  No visual changes, runny nose, sore throat, mouth sores or tenderness. Lungs:  No shortness of breath or cough.  No hemoptysis. Cardiac:  No chest pain, palpitations, orthopnea, or PND. GI:    No abdominal pain or bloating.  Decreased appetite.  Mild nausea.  No vomiting, diarrhea, constipation, melena or hematochezia. GU:  No urgency, frequency, dysuria, or hematuria. Musculoskeletal:  No bone pain.  No muscle tenderness.  Takes Claritin to prevent Neulasta induced bone pain. Extremities:  No pain or swelling. Skin:  Urticaria, resolved.  No rashes or ulcers. Neuro:  No neuropathy.  No headache, numbness or weakness, balance or  coordination issues. Endocrine:  No diabetes, thyroid issues, hot flashes or night sweats. Psych:  No mood changes, depression or anxiety. Pain: No pain. Review of systems:  All other systems reviewed and found to be negative.  Physical Exam: Blood pressure 124/84, pulse 70, resp. rate 18, weight 184 lb 6 oz (83.6 kg). GENERAL:  Well developed, well nourished, gentleman sitting comfortably in the exam room in no acute distress. MENTAL STATUS:  Alert and oriented to person, place and time. HEAD:  Wearing a maroon Nike cap. Thin mustache.  Normocephalic, atraumatic, face symmetric, no Cushingoid features. EYES:  Brown eyes.  Pupils equal rounfd and reactive to light and accomodation.  No conjunctivitis or scleral icterus. ENT:  Oropharynx clear without lesion.  Tongue normal. Mucous membranes moist.  RESPIRATORY:  Clear to  auscultation without rales, wheezes or rhonchi. CARDIOVASCULAR:  Regular rate and rhythm without murmur, rub or gallop. ABDOMEN:  Soft, non-tender with active bowel sounds and no hepatosplenomegaly.  No masses. SKIN:  No urticaria.  No rashes or ulcers. EXTREMITIES: No edema, no skin discoloration or tenderness.  No palpable cords. LYMPH NODES: No palpable cervical, supraclavicular, axillary or inguinal adenopathy  NEUROLOGICAL: Unremarkable. PSYCH:  Appropriate. Smiling.  Imaging studies: 07/29/2015:  Chest, abdomen and pelvic CT revealed a 4 cm thickened and irregular gastric wall compatible with an infiltrative malignancy. There was extensive involvement of the upper mesentery and omentum compatible with peritoneal carcinomatosis. There was a small amount of ascites.    08/08/2015:  Head CT revealed no evidence of metastatic disease. 10/05/2015:  Abdomen and pelvic CT revealed a positive response to therapy with decreased size of the primary mass along the lesser curvature of the stomach, decrease extension of the mass into the gastrohepatic ligament, decreasing evidence  of peritoneal carcinomatosis involving predominantly the omentum, and resolution of previously noted malignant ascites. There was a potential ulcer along the greater curvature of the mid body of the stomach.   12/24/2015:  Abdomen and pelvic CT revealed further improvement in primary gastric malignancy and adjacent lymphadenopathy. There was no residual peritoneal carcinomatosis identified. 03/21/2016:  Abdominal CT revealed new small volume ascites within the abdomen extending over the liver and spleen.  There was increase soft tissue stranding throughout the peritoneal cavity worrisome for recurrence of peritoneal carcinomatosis. 04/24/2016:  Abdomen and pelvic CT revealed progressive intraperitoneal metastatic disease with increased ascites and omental caking.   Infusion on 06/26/2016  Component Date Value Ref Range Status  . WBC 06/26/2016 3.3* 3.8 - 10.6 K/uL Final  . RBC 06/26/2016 4.48  4.40 - 5.90 MIL/uL Final  . Hemoglobin 06/26/2016 13.1  13.0 - 18.0 g/dL Final  . HCT 06/26/2016 37.4* 40.0 - 52.0 % Final  . MCV 06/26/2016 83.5  80.0 - 100.0 fL Final  . MCH 06/26/2016 29.2  26.0 - 34.0 pg Final  . MCHC 06/26/2016 35.0  32.0 - 36.0 g/dL Final  . RDW 06/26/2016 15.1* 11.5 - 14.5 % Final  . Platelets 06/26/2016 192  150 - 440 K/uL Final  . Neutrophils Relative % 06/26/2016 40  % Final  . Neutro Abs 06/26/2016 1.3* 1.4 - 6.5 K/uL Final  . Lymphocytes Relative 06/26/2016 42  % Final  . Lymphs Abs 06/26/2016 1.4  1.0 - 3.6 K/uL Final  . Monocytes Relative 06/26/2016 15  % Final  . Monocytes Absolute 06/26/2016 0.5  0.2 - 1.0 K/uL Final  . Eosinophils Relative 06/26/2016 1  % Final  . Eosinophils Absolute 06/26/2016 0.0  0 - 0.7 K/uL Final  . Basophils Relative 06/26/2016 2  % Final  . Basophils Absolute 06/26/2016 0.1  0 - 0.1 K/uL Final  . Sodium 06/26/2016 138  135 - 145 mmol/L Final  . Potassium 06/26/2016 3.5  3.5 - 5.1 mmol/L Final  . Chloride 06/26/2016 106  101 - 111 mmol/L  Final  . CO2 06/26/2016 27  22 - 32 mmol/L Final  . Glucose, Bld 06/26/2016 99  65 - 99 mg/dL Final  . BUN 06/26/2016 9  6 - 20 mg/dL Final  . Creatinine, Ser 06/26/2016 0.75  0.61 - 1.24 mg/dL Final  . Calcium 06/26/2016 8.4* 8.9 - 10.3 mg/dL Final  . Total Protein 06/26/2016 7.0  6.5 - 8.1 g/dL Final  . Albumin 06/26/2016 3.6  3.5 - 5.0 g/dL Final  .  AST 06/26/2016 26  15 - 41 U/L Final  . ALT 06/26/2016 19  17 - 63 U/L Final  . Alkaline Phosphatase 06/26/2016 74  38 - 126 U/L Final  . Total Bilirubin 06/26/2016 0.7  0.3 - 1.2 mg/dL Final  . GFR calc non Af Amer 06/26/2016 >60  >60 mL/min Final  . GFR calc Af Amer 06/26/2016 >60  >60 mL/min Final   Comment: (NOTE) The eGFR has been calculated using the CKD EPI equation. This calculation has not been validated in all clinical situations. eGFR's persistently <60 mL/min signify possible Chronic Kidney Disease.   . Anion gap 06/26/2016 5  5 - 15 Final  . Magnesium 06/26/2016 2.1  1.7 - 2.4 mg/dL Final    Assessment:  Xaviar Lunn is a 36 y.o. male with metastatic gastric cancer.  He presented with a 4-6 week history of progressive epigastric pain.  EGD on 08/02/2015 revealed a large, ulcerated, non-circumferential mass with oozing and stigmata of recent bleeding in the entire examined stomach.  The mass involved the fundus down to the antrum.  Biopsies revealed poorly differentiated adenocarcinoma.  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  Her2/neu testing was negative.  Microsatellite testing was negative.  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  PDL-1 was expressed.  CEA was 0.3 on 07/30/2015.  Chest, abdomen and pelvic CT scan on 07/29/2015 revealed a 4 cm thickened and irregular gastric wall compatible with an infiltrative malignancy. There was extensive  involvement of the upper mesentery and omentum compatible with peritoneal carcinomatosis. There was a small amount of ascites.     He completed treatment for H pylori  (began 08/09/2015).  Stool was negative for H pylori on 11/01/2015.  He has iron deficiency anemia likely gastric oozing and modest diet.  RBC are microcytic.  Labs on 08/21/2015 revealed a ferritin was 44 with an iron saturation 4% and a TIBC of 395.  Ferritin was 116 on 09/18/2015.  He received 12 cycles of FOLFOX chemotherapy (08/14/2015 - 02/05/2016).  Chemotherapy was held on 10/23/2015 secondary to thrombocytopenia (87,000).  Platelet count was 90,000 prior to cycle #11.  He has tolerated his chemotherapy well without diarrhea, mouth sores, or neuropathy.  He received 3 cycles of maintenance 5FU/LV (03/03/2016 - 04/08/2016).  He is s/p 2 cycles of ramucirumab + Taxol (05/01/2016 - 05/29/2016).  He describes slight transient tingling in his fingertips.  EGD on 02/26/2016 revealed one non-bleeding gastric ulcer.   The mass appeared smaller.  Biopsy revealed moderate chronic gastritis and edema, negative for H pylori, dysplasia and malignancy.  Abdomen and pelvic CT on 04/24/2016 revealed progressive intraperitoneal metastatic disease with increased ascites and omental caking.  Abdominal and pelvic CT on 06/17/2016 revealed decreased omental metastatic disease.  The volume of ascites had decreased.  He has a history of urticaria of unclear etiology.  He is followed by dermatology.  He takes Zyrtec every morning and hydroxyzine at night.  He takes Xyzal 5 mg daily.  He was instructed to use mild soaps and not to use perfumes.   Symptomatically, his abdominal pain has resolved.  Exam is normal.  ANC is 1300.  Potassium is normal.  Plan: 1.  Labs today:  CBC with diff, CMP, Mg. 2.  Review interval CT scan.  Disease is improving.  Continue current treatment.  Discuss borderline counts with last cycle.  Consider intermittent  use of  GCSF or Granix. 3.  Day 1 of cycle #3 Taxol and ramucirumab today. 4.  Continue potassium 10 meq po q day. 5.  Preauth Granix prn. 6.  RTC in 1 week for labs (CBC with diff, CMP) and day 8 of cycle #3 Taxol. 7.  RTC in 2 weeks for MD assessment, labs (CBC with diff, CMP, Mg), and day 15 of cycle #3 Taxol and ramucirumab. 8.  RTC in 3 weeks for labs (CBC with diff). 9.  RTC in 4 weeks for MD assessment, labs (CBC with diff, CMP, Mg), and day 1 of cycle #4 Taxol and ramucirumab.   Lequita Asal, MD  06/26/2016, 9:48 AM

## 2016-06-27 ENCOUNTER — Other Ambulatory Visit: Payer: Self-pay | Admitting: *Deleted

## 2016-06-27 MED ORDER — OXYCODONE HCL 10 MG PO TABS: 10.0000 mg | ORAL_TABLET | Freq: Four times a day (QID) | ORAL | 0 refills | Status: DC | PRN

## 2016-06-27 NOTE — Telephone Encounter (Signed)
I called back and explained that he is no longer on Hydrocodone, but Oxycodone instead, she asked him and he is out of that as well. Will refill Oxycodone

## 2016-07-03 ENCOUNTER — Inpatient Hospital Stay: Payer: Self-pay

## 2016-07-03 VITALS — BP 122/81 | HR 71 | Temp 98.0°F | Resp 18

## 2016-07-03 DIAGNOSIS — C162 Malignant neoplasm of body of stomach: Secondary | ICD-10-CM

## 2016-07-03 DIAGNOSIS — C786 Secondary malignant neoplasm of retroperitoneum and peritoneum: Secondary | ICD-10-CM

## 2016-07-03 DIAGNOSIS — Z5111 Encounter for antineoplastic chemotherapy: Secondary | ICD-10-CM

## 2016-07-03 DIAGNOSIS — C801 Malignant (primary) neoplasm, unspecified: Secondary | ICD-10-CM

## 2016-07-03 LAB — COMPREHENSIVE METABOLIC PANEL
ALT: 25 U/L (ref 17–63)
AST: 34 U/L (ref 15–41)
Albumin: 3.8 g/dL (ref 3.5–5.0)
Alkaline Phosphatase: 75 U/L (ref 38–126)
Anion gap: 5 (ref 5–15)
BUN: 9 mg/dL (ref 6–20)
CO2: 25 mmol/L (ref 22–32)
Calcium: 8.7 mg/dL — ABNORMAL LOW (ref 8.9–10.3)
Chloride: 105 mmol/L (ref 101–111)
Creatinine, Ser: 0.68 mg/dL (ref 0.61–1.24)
GFR calc Af Amer: >60 mL/min (ref >60)
GFR calc non Af Amer: >60 mL/min (ref >60)
Glucose, Bld: 183 mg/dL — ABNORMAL HIGH (ref 65–99)
Potassium: 4 mmol/L (ref 3.5–5.1)
Sodium: 135 mmol/L (ref 135–145)
Total Bilirubin: 0.7 mg/dL (ref 0.3–1.2)
Total Protein: 7.3 g/dL (ref 6.5–8.1)

## 2016-07-03 LAB — CBC WITH DIFFERENTIAL/PLATELET
Basophils Absolute: 0.1 10*3/uL (ref 0–0.1)
Basophils Relative: 1 %
Eosinophils Absolute: 0.1 10*3/uL (ref 0–0.7)
Eosinophils Relative: 1 %
HCT: 36.8 % — ABNORMAL LOW (ref 40.0–52.0)
Hemoglobin: 12.7 g/dL — ABNORMAL LOW (ref 13.0–18.0)
Lymphocytes Relative: 30 %
Lymphs Abs: 1.3 10*3/uL (ref 1.0–3.6)
MCH: 28.8 pg (ref 26.0–34.0)
MCHC: 34.5 g/dL (ref 32.0–36.0)
MCV: 83.5 fL (ref 80.0–100.0)
Monocytes Absolute: 0.1 10*3/uL — ABNORMAL LOW (ref 0.2–1.0)
Monocytes Relative: 3 %
Neutro Abs: 2.7 10*3/uL (ref 1.4–6.5)
Neutrophils Relative %: 65 %
Platelets: 178 10*3/uL (ref 150–440)
RBC: 4.4 MIL/uL (ref 4.40–5.90)
RDW: 15 % — ABNORMAL HIGH (ref 11.5–14.5)
WBC: 4.2 10*3/uL (ref 3.8–10.6)

## 2016-07-03 MED ORDER — DEXAMETHASONE SODIUM PHOSPHATE 10 MG/ML IJ SOLN
10.0000 mg | Freq: Once | INTRAMUSCULAR | Status: AC
  Administered 2016-07-03: 10 mg via INTRAVENOUS
  Filled 2016-07-03: qty 1

## 2016-07-03 MED ORDER — HEPARIN SOD (PORK) LOCK FLUSH 100 UNIT/ML IV SOLN
500.0000 [IU] | Freq: Once | INTRAVENOUS | Status: AC | PRN
  Administered 2016-07-03: 500 [IU]
  Filled 2016-07-03: qty 5

## 2016-07-03 MED ORDER — DIPHENHYDRAMINE HCL 50 MG/ML IJ SOLN
50.0000 mg | Freq: Once | INTRAMUSCULAR | Status: AC
  Administered 2016-07-03: 50 mg via INTRAVENOUS
  Filled 2016-07-03: qty 1

## 2016-07-03 MED ORDER — FAMOTIDINE IN NACL 20-0.9 MG/50ML-% IV SOLN
20.0000 mg | Freq: Once | INTRAVENOUS | Status: AC
  Administered 2016-07-03: 20 mg via INTRAVENOUS
  Filled 2016-07-03: qty 50

## 2016-07-03 MED ORDER — SODIUM CHLORIDE 0.9 % IV SOLN
Freq: Once | INTRAVENOUS | Status: AC
  Administered 2016-07-03: 14:00:00 via INTRAVENOUS
  Filled 2016-07-03: qty 1000

## 2016-07-03 MED ORDER — PACLITAXEL CHEMO INJECTION 300 MG/50ML
80.0000 mg/m2 | Freq: Once | INTRAVENOUS | Status: AC
  Administered 2016-07-03: 162 mg via INTRAVENOUS
  Filled 2016-07-03: qty 27

## 2016-07-03 MED ORDER — SODIUM CHLORIDE 0.9% FLUSH
10.0000 mL | INTRAVENOUS | Status: DC | PRN
  Administered 2016-07-03: 10 mL
  Filled 2016-07-03: qty 10

## 2016-07-08 ENCOUNTER — Telehealth: Payer: Self-pay | Admitting: *Deleted

## 2016-07-08 MED ORDER — HYDROCODONE-ACETAMINOPHEN 7.5-325 MG PO TABS: 1.0000 | ORAL_TABLET | ORAL | 0 refills | Status: DC | PRN

## 2016-07-08 NOTE — Telephone Encounter (Signed)
Anthony Melendez informed to pick up Rx

## 2016-07-08 NOTE — Telephone Encounter (Signed)
Sister called to report that his pain is not controlled and that he does not like to take the Oxycodone because it makes him sleepy and he would like to go back on the Hydrocodone. Please advise.

## 2016-07-08 NOTE — Telephone Encounter (Signed)
Per VO Dr Mike Gip, change Oxycodone to Hydrocodone APAP 7.5/325 mg

## 2016-07-10 ENCOUNTER — Inpatient Hospital Stay: Payer: Self-pay | Attending: Oncology

## 2016-07-10 ENCOUNTER — Other Ambulatory Visit: Payer: Self-pay | Admitting: Hematology and Oncology

## 2016-07-10 ENCOUNTER — Inpatient Hospital Stay: Payer: Self-pay

## 2016-07-10 ENCOUNTER — Encounter: Payer: Self-pay | Admitting: Oncology

## 2016-07-10 ENCOUNTER — Inpatient Hospital Stay (HOSPITAL_BASED_OUTPATIENT_CLINIC_OR_DEPARTMENT_OTHER): Payer: Self-pay | Admitting: Oncology

## 2016-07-10 VITALS — BP 132/85 | HR 76 | Temp 98.2°F | Resp 18 | Wt 185.1 lb

## 2016-07-10 DIAGNOSIS — C162 Malignant neoplasm of body of stomach: Secondary | ICD-10-CM

## 2016-07-10 DIAGNOSIS — D701 Agranulocytosis secondary to cancer chemotherapy: Secondary | ICD-10-CM | POA: Insufficient documentation

## 2016-07-10 DIAGNOSIS — C786 Secondary malignant neoplasm of retroperitoneum and peritoneum: Secondary | ICD-10-CM

## 2016-07-10 DIAGNOSIS — K219 Gastro-esophageal reflux disease without esophagitis: Secondary | ICD-10-CM | POA: Insufficient documentation

## 2016-07-10 DIAGNOSIS — G893 Neoplasm related pain (acute) (chronic): Secondary | ICD-10-CM | POA: Insufficient documentation

## 2016-07-10 DIAGNOSIS — R188 Other ascites: Secondary | ICD-10-CM | POA: Insufficient documentation

## 2016-07-10 DIAGNOSIS — Z79899 Other long term (current) drug therapy: Secondary | ICD-10-CM

## 2016-07-10 DIAGNOSIS — Z7689 Persons encountering health services in other specified circumstances: Secondary | ICD-10-CM

## 2016-07-10 DIAGNOSIS — Z8719 Personal history of other diseases of the digestive system: Secondary | ICD-10-CM

## 2016-07-10 DIAGNOSIS — C801 Malignant (primary) neoplasm, unspecified: Secondary | ICD-10-CM

## 2016-07-10 DIAGNOSIS — Z5111 Encounter for antineoplastic chemotherapy: Secondary | ICD-10-CM | POA: Insufficient documentation

## 2016-07-10 DIAGNOSIS — L509 Urticaria, unspecified: Secondary | ICD-10-CM | POA: Insufficient documentation

## 2016-07-10 DIAGNOSIS — D509 Iron deficiency anemia, unspecified: Secondary | ICD-10-CM | POA: Insufficient documentation

## 2016-07-10 DIAGNOSIS — Z5112 Encounter for antineoplastic immunotherapy: Secondary | ICD-10-CM | POA: Insufficient documentation

## 2016-07-10 DIAGNOSIS — T451X5A Adverse effect of antineoplastic and immunosuppressive drugs, initial encounter: Secondary | ICD-10-CM

## 2016-07-10 LAB — CBC WITH DIFFERENTIAL/PLATELET
Basophils Absolute: 0 10*3/uL (ref 0–0.1)
Basophils Relative: 2 %
Eosinophils Absolute: 0 10*3/uL (ref 0–0.7)
Eosinophils Relative: 2 %
HCT: 34.4 % — ABNORMAL LOW (ref 40.0–52.0)
Hemoglobin: 12 g/dL — ABNORMAL LOW (ref 13.0–18.0)
Lymphocytes Relative: 60 %
Lymphs Abs: 1.1 10*3/uL (ref 1.0–3.6)
MCH: 29.1 pg (ref 26.0–34.0)
MCHC: 34.9 g/dL (ref 32.0–36.0)
MCV: 83.5 fL (ref 80.0–100.0)
Monocytes Absolute: 0.1 10*3/uL — ABNORMAL LOW (ref 0.2–1.0)
Monocytes Relative: 5 %
Neutro Abs: 0.6 10*3/uL — ABNORMAL LOW (ref 1.4–6.5)
Neutrophils Relative %: 31 %
Platelets: 146 10*3/uL — ABNORMAL LOW (ref 150–440)
RBC: 4.11 MIL/uL — ABNORMAL LOW (ref 4.40–5.90)
RDW: 15.2 % — ABNORMAL HIGH (ref 11.5–14.5)
WBC: 1.8 10*3/uL — ABNORMAL LOW (ref 3.8–10.6)

## 2016-07-10 LAB — COMPREHENSIVE METABOLIC PANEL
ALT: 28 U/L (ref 17–63)
AST: 36 U/L (ref 15–41)
Albumin: 3.8 g/dL (ref 3.5–5.0)
Alkaline Phosphatase: 80 U/L (ref 38–126)
Anion gap: 8 (ref 5–15)
BUN: 7 mg/dL (ref 6–20)
CO2: 25 mmol/L (ref 22–32)
Calcium: 8.8 mg/dL — ABNORMAL LOW (ref 8.9–10.3)
Chloride: 103 mmol/L (ref 101–111)
Creatinine, Ser: 0.65 mg/dL (ref 0.61–1.24)
GFR calc Af Amer: >60 mL/min (ref >60)
GFR calc non Af Amer: >60 mL/min (ref >60)
Glucose, Bld: 159 mg/dL — ABNORMAL HIGH (ref 65–99)
Potassium: 3.8 mmol/L (ref 3.5–5.1)
Sodium: 136 mmol/L (ref 135–145)
Total Bilirubin: 0.8 mg/dL (ref 0.3–1.2)
Total Protein: 7.2 g/dL (ref 6.5–8.1)

## 2016-07-10 MED ORDER — SODIUM CHLORIDE 0.9% FLUSH
10.0000 mL | INTRAVENOUS | Status: DC | PRN
  Administered 2016-07-10: 10 mL via INTRAVENOUS
  Filled 2016-07-10: qty 10

## 2016-07-10 MED ORDER — HEPARIN SOD (PORK) LOCK FLUSH 100 UNIT/ML IV SOLN
500.0000 [IU] | Freq: Once | INTRAVENOUS | Status: AC
  Administered 2016-07-10: 500 [IU] via INTRAVENOUS
  Filled 2016-07-10: qty 5

## 2016-07-10 NOTE — Progress Notes (Signed)
Hematology/Oncology Consult note [facility]  [phone] [phone]  Patient Care Team: Patient, No Pcp Per as PCP - General (General Practice) Lucilla Lame, MD as Consulting Physician (Gastroenterology) Hinda Kehr, MD as Attending Physician (Emergency Medicine)   Name of the patient: Anthony Melendez  [id number]  January 13, 1981   Date of visit: 07/10/16  Diagnosis- metastatic gastric cancer  Chief complaint/ Reason for visit- on treatment assessment prior to cyramza and taxol cycle 3 Day 15  Heme/Onc history: Anthony Melendez a 36 y.o.malewith metastatic gastric cancer. He presented with a 4-6 week history of progressive epigastric pain. EGD on 08/02/2015 revealed a large, ulcerated, non-circumferential mass with oozing and stigmata of recent bleeding in the entire examined stomach. The mass involved the fundus down to the antrum. Biopsies revealed poorly differentiated adenocarcinoma. 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  Her2/neutesting was negative. Microsatellite testingwas negative. Inv[employer] testingreturned a variant of uncertain significance in MSH2. 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 PDL-1 was expressed. CEAwas 0.3 on 07/30/2015.  Chest, abdomen and pelvic CT scanon 07/29/2015 revealed a 4 cm thickened and irregular gastric wall compatible with an infiltrative malignancy. There was extensive involvement of the upper mesentery and omentum compatible with peritoneal carcinomatosis. There was a small amount of ascites.   He completed treatment for H pylori(began 08/09/2015). Stool was negative for H pylori on 11/01/2015. He hasiron deficiency anemialikely gastric oozing and modest diet. RBC are microcytic. Labs on 08/21/2015 revealed a ferritin was 44 with an iron saturation 4% and a TIBC of  395. Ferritinwas 116 on 09/18/2015.  He received 12 cycles of FOLFOXchemotherapy (08/14/2015 - 02/05/2016). Chemotherapy was held on 10/23/2015 secondary to thrombocytopenia (87,000). Platelet count was 90,000 prior to cycle #11. He has tolerated his chemotherapy well without diarrhea, mouth sores, or neuropathy.  He received 3 cycles of maintenance 5FU/LV(03/03/2016 - 04/08/2016).  EGD on 02/26/2016 revealed one non-bleeding gastric ulcer. The mass appeared smaller. Biopsy revealed moderate chronic gastritis and edema, negative for H pylori, dysplasia and malignancy.  Abdomen and pelvic CTon 04/24/2016 revealed progressive intraperitoneal metastatic disease with increased ascites and omental caking.  He hasurticaria of unclear etiology.   Interval history- reports one episode of chills 3 days ago. No fever. Feels fatigued. Has intermittent chest wall pain and abdominal painw hich is chronic. Also has scatterd pin point skin rash oevr his back and nape of his neck which is unchanged. History obtained through interpretor  ECOG PS- 1 Pain scale- 3 Opioid associated constipation- no  Review of systems- Review of Systems  Constitutional: Positive for chills. Negative for fever, malaise/fatigue and weight loss.  HENT: Negative for congestion, ear discharge and nosebleeds.   Eyes: Negative for blurred vision.  Respiratory: Negative for cough, hemoptysis, sputum production, shortness of breath and wheezing.   Cardiovascular: Negative for chest pain, palpitations, orthopnea and claudication.  Gastrointestinal: Positive for abdominal pain. Negative for blood in stool, constipation, diarrhea, heartburn, melena, nausea and vomiting.  Genitourinary: Negative for dysuria, flank pain, frequency, hematuria and urgency.  Musculoskeletal: Negative for back pain, joint pain and myalgias.  Skin: Negative for rash.  Neurological: Positive for weakness. Negative for dizziness, tingling, focal  weakness, seizures and headaches.  Endo/Heme/Allergies: Does not bruise/bleed easily.  Psychiatric/Behavioral: Negative for depression and suicidal ideas. The patient does not have insomnia.       Allergies  Allergen Reactions  . Iodine     Possible anaphylaxis per  MD      Past Medical History:  Diagnosis Date  . Gastric cancer ([place]) 08/2015   Folfox chemo tx's.  Marland Kitchen GERD (gastroesophageal reflux disease)      Past Surgical History:  Procedure Laterality Date  . ESOPHAGOGASTRODUODENOSCOPY (EGD) WITH PROPOFOL N/A 08/02/2015   Procedure: ESOPHAGOGASTRODUODENOSCOPY (EGD) WITH PROPOFOL;  Surgeon: Lucilla Lame, MD;  Location: [facility];  Service: Endoscopy;  Laterality: N/A;  . ESOPHAGOGASTRODUODENOSCOPY (EGD) WITH PROPOFOL N/A 02/26/2016   Procedure: ESOPHAGOGASTRODUODENOSCOPY (EGD) WITH PROPOFOL;  Surgeon: Lucilla Lame, MD;  Location: [facility];  Service: Endoscopy;  Laterality: N/A;  . NO PAST SURGERIES    . PERIPHERAL VASCULAR CATHETERIZATION N/A 08/13/2015   Procedure: Glori Luis Cath Insertion;  Surgeon: Algernon Huxley, MD;  Location: [facility];  Service: Cardiovascular;  Laterality: N/A;    Social History   Social History  . Marital status: Single    Spouse name: N/A  . Number of children: N/A  . Years of education: N/A   Occupational History  . Not on file.   Social History Main Topics  . Smoking status: Never Smoker  . Smokeless tobacco: Never Used  . Alcohol use No  . Drug use: No  . Sexual activity: Not on file   Other Topics Concern  . Not on file   Social History Narrative  . No narrative on file    No family history on file.   Current Outpatient Prescriptions:  .  Diphenhyd-Hydrocort-Nystatin (FIRST-DUKES MOUTHWASH) SUSP, Use as directed 10 mLs in the mouth or throat 4 (four) times daily as needed. (Patient not taking: Reported on 06/12/2016), Disp: 480 mL, Rfl: 3 .  ferrous sulfate 325 (65 FE) MG EC tablet, Take 1 tablet (325 mg  total) by mouth daily with breakfast., Disp: 30 tablet, Rfl: 3 .  gabapentin (NEURONTIN) 100 MG capsule, Take 1 capsule (100 mg total) by mouth 3 (three) times daily. (Patient not taking: Reported on 06/12/2016), Disp: 90 capsule, Rfl: 1 .  HYDROcodone-acetaminophen (NORCO) 7.5-325 MG tablet, Take 1 tablet by mouth every 4 (four) hours as needed for moderate pain., Disp: 90 tablet, Rfl: 0 .  hydrOXYzine (ATARAX/VISTARIL) 25 MG tablet, Take 25 mg by mouth daily., Disp: , Rfl:  .  lactulose (CHRONULAC) 10 GM/15ML solution, Take 30 mLs (20 g total) by mouth daily as needed for mild constipation., Disp: 120 mL, Rfl: 0 .  levocetirizine (XYZAL) 5 MG tablet, Take 5 mg by mouth every evening., Disp: , Rfl:  .  omeprazole (PRILOSEC) 20 MG capsule, Take 1 capsule (20 mg total) by mouth 2 (two) times daily before a meal., Disp: 60 capsule, Rfl: 2 .  ondansetron (ZOFRAN) 8 MG tablet, Take 1 tablet (8 mg total) by mouth 2 (two) times daily as needed (Nausea or vomiting)., Disp: 30 tablet, Rfl: 1 .  potassium chloride (K-DUR) 10 MEQ tablet, Take 1 tablet daily, Disp: 30 tablet, Rfl: 1 .  prochlorperazine (COMPAZINE) 10 MG tablet, Take 1 tablet (10 mg total) by mouth every 6 (six) hours as needed for nausea or vomiting., Disp: 30 tablet, Rfl: 1 No current facility-administered medications for this visit.   Facility-Administered Medications Ordered in Other Visits:  .  heparin lock flush 100 unit/mL, 500 Units, Intravenous, Once, Corcoran, Melissa C, MD .  pegfilgrastim (NEULASTA) injection 6 mg, 6 mg, Subcutaneous, Once, Corcoran, Melissa C, MD .  sodium chloride 0.9 % injection 10 mL, 10 mL, Intravenous, Once, Corcoran, Melissa C, MD .  sodium chloride flush (NS)  0.9 % injection 10 mL, 10 mL, Intravenous, PRN, Mike Gip, Melissa C, MD, 10 mL at 08/28/15 0835  Physical exam:  Vitals:   07/10/16 0850  BP: 132/85  Pulse: 76  Resp: 18  Temp: 98.2 F (36.8 C)  TempSrc: Tympanic  Weight: 185 lb 2 oz (84 kg)    Physical Exam  Constitutional: He is oriented to person, place, and time and well-developed, well-nourished, and in no distress.  HENT:  Head: Normocephalic and atraumatic.  Mouth/Throat: Oropharynx is clear and moist.  Eyes: EOM are normal. Pupils are equal, round, and reactive to light.  Neck: Normal range of motion.  Cardiovascular: Normal rate, regular rhythm and normal heart sounds.   Pulmonary/Chest: Effort normal and breath sounds normal.  Abdominal: Soft. Bowel sounds are normal.  Mild diffuse TTP  Neurological: He is alert and oriented to person, place, and time.  Skin: Skin is warm and dry.  Scattered areas of pinpoint lesions noted over back and nape of neck. Most of them appear chronic. No open areas or ulcerations     CMP Latest Ref Rng & Units 07/10/2016  Glucose 65 - 99 mg/dL 159(H)  BUN 6 - 20 mg/dL 7  Creatinine 0.61 - 1.24 mg/dL 0.65  Sodium 135 - 145 mmol/L 136  Potassium 3.5 - 5.1 mmol/L 3.8  Chloride 101 - 111 mmol/L 103  CO2 22 - 32 mmol/L 25  Calcium 8.9 - 10.3 mg/dL 8.8(L)  Total Protein 6.5 - 8.1 g/dL 7.2  Total Bilirubin 0.3 - 1.2 mg/dL 0.8  Alkaline Phos 38 - 126 U/L 80  AST 15 - 41 U/L 36  ALT 17 - 63 U/L 28   CBC Latest Ref Rng & Units 07/10/2016  WBC 3.8 - 10.6 K/uL 1.8(L)  Hemoglobin 13.0 - 18.0 g/dL 12.0(L)  Hematocrit 40.0 - 52.0 % 34.4(L)  Platelets 150 - 440 K/uL 146(L)    No images are attached to the encounter.  Ct Abdomen Pelvis Wo Contrast  Result Date: 06/17/2016 CLINICAL DATA:  Abdominal pain with history of gastric cancer. Currently on chemotherapy. EXAM: CT ABDOMEN AND PELVIS WITHOUT CONTRAST TECHNIQUE: Multidetector CT imaging of the abdomen and pelvis was performed following the standard protocol without IV contrast. COMPARISON:  CT abdomen pelvis 04/24/2016 FINDINGS: Lower chest: No pulmonary nodules. No visible pleural or pericardial effusion. Hepatobiliary: Small amount of perihepatic ascites. No focal liver lesions. No biliary  dilatation. Normal gallbladder. Pancreas: Normal pancreatic contours. No peripancreatic fluid collection or pancreatic ductal dilatation. Spleen: Small amount of perisplenic fluid.  Otherwise normal. Adrenals/Urinary Tract: Normal adrenal glands. No hydronephrosis or nephrolithiasis. No abnormal perinephric stranding. No ureteral obstruction. Stomach/Bowel: There is no hiatal hernia. No gastric masses visible. There is no dilated small bowel. No focal colonic abnormality. The appendix is normal. Vascular/Lymphatic: Normal course and caliber of the major abdominal vessels. No abdominal or pelvic adenopathy. Reproductive: Prostate calcifications.  Normal seminal vesicles. Musculoskeletal: No lytic or blastic osseous lesion. Normal visualized extrathoracic and extraperitoneal soft tissues. Other: Numerous anterior omental implants are again seen, decreased from the prior study. The volume of ascites has also slightly decreased. IMPRESSION: 1. Decreased omental metastatic disease compared to the CT of 04/24/2016. The volume of ascites is also decreased. 2. No acute abdominal or pelvic abnormality. Electronically Signed   By: Ulyses Jarred M.D.   On: 06/17/2016 04:45     Assessment and plan- Patient is a 36 y.o. male with metastatic gastric cancer currently on cyramza/taxol with response to rx on recent scans  Wbc 1.8 today with anc of 0.6 today. Chemo will therefore be held today. RTC in 1 week to see Dr. Mike Gip with cbc, cmp and mg for C4 D1. Continue 10 meq potassium. He will call us if he has any fever of infectious symptoms. Patient verbalized understanding  Neoplasm related pain- continue hydrocone and neurontin   Visit Diagnosis 1. Malignant neoplasm of body of stomach ([place])   2. Peritoneal carcinomatosis ([place])   3. Chemotherapy induced neutropenia ([facility])      Dr. Randa Evens, MD, MPH [facility] at [facility] Pager- [phone] 07/10/2016 9:08 AM

## 2016-07-10 NOTE — Progress Notes (Signed)
Patient complains of pain in right lower back that is new.  Also has a rash on his back and back of his neck.  States he sometimes has pain in his abdomen.  Patient states he has chills at times but when he takes his temp he has no fever.  He also has pain in his forehead at the same time.

## 2016-07-17 ENCOUNTER — Inpatient Hospital Stay (HOSPITAL_BASED_OUTPATIENT_CLINIC_OR_DEPARTMENT_OTHER): Payer: Self-pay | Admitting: Hematology and Oncology

## 2016-07-17 ENCOUNTER — Encounter: Payer: Self-pay | Admitting: Hematology and Oncology

## 2016-07-17 ENCOUNTER — Inpatient Hospital Stay: Payer: Self-pay

## 2016-07-17 ENCOUNTER — Other Ambulatory Visit: Payer: Self-pay | Admitting: Hematology and Oncology

## 2016-07-17 VITALS — BP 127/81 | HR 81 | Temp 98.6°F | Resp 18 | Wt 187.2 lb

## 2016-07-17 DIAGNOSIS — D701 Agranulocytosis secondary to cancer chemotherapy: Secondary | ICD-10-CM

## 2016-07-17 DIAGNOSIS — G893 Neoplasm related pain (acute) (chronic): Secondary | ICD-10-CM

## 2016-07-17 DIAGNOSIS — D509 Iron deficiency anemia, unspecified: Secondary | ICD-10-CM

## 2016-07-17 DIAGNOSIS — C786 Secondary malignant neoplasm of retroperitoneum and peritoneum: Secondary | ICD-10-CM

## 2016-07-17 DIAGNOSIS — Z7189 Other specified counseling: Secondary | ICD-10-CM

## 2016-07-17 DIAGNOSIS — C801 Malignant (primary) neoplasm, unspecified: Secondary | ICD-10-CM

## 2016-07-17 DIAGNOSIS — C162 Malignant neoplasm of body of stomach: Secondary | ICD-10-CM

## 2016-07-17 DIAGNOSIS — Z5111 Encounter for antineoplastic chemotherapy: Secondary | ICD-10-CM

## 2016-07-17 DIAGNOSIS — Z8719 Personal history of other diseases of the digestive system: Secondary | ICD-10-CM

## 2016-07-17 DIAGNOSIS — K219 Gastro-esophageal reflux disease without esophagitis: Secondary | ICD-10-CM

## 2016-07-17 DIAGNOSIS — E876 Hypokalemia: Secondary | ICD-10-CM

## 2016-07-17 DIAGNOSIS — R188 Other ascites: Secondary | ICD-10-CM

## 2016-07-17 DIAGNOSIS — Z79899 Other long term (current) drug therapy: Secondary | ICD-10-CM

## 2016-07-17 DIAGNOSIS — T451X5A Adverse effect of antineoplastic and immunosuppressive drugs, initial encounter: Secondary | ICD-10-CM

## 2016-07-17 DIAGNOSIS — Z7689 Persons encountering health services in other specified circumstances: Secondary | ICD-10-CM

## 2016-07-17 LAB — CBC WITH DIFFERENTIAL/PLATELET
Basophils Absolute: 0 10*3/uL (ref 0–0.1)
Basophils Relative: 1 %
Eosinophils Absolute: 0 10*3/uL (ref 0–0.7)
Eosinophils Relative: 2 %
HCT: 35.7 % — ABNORMAL LOW (ref 40.0–52.0)
Hemoglobin: 12.4 g/dL — ABNORMAL LOW (ref 13.0–18.0)
Lymphocytes Relative: 41 %
Lymphs Abs: 0.9 10*3/uL — ABNORMAL LOW (ref 1.0–3.6)
MCH: 29 pg (ref 26.0–34.0)
MCHC: 34.7 g/dL (ref 32.0–36.0)
MCV: 83.4 fL (ref 80.0–100.0)
Monocytes Absolute: 0.4 10*3/uL (ref 0.2–1.0)
Monocytes Relative: 17 %
Neutro Abs: 0.9 10*3/uL — ABNORMAL LOW (ref 1.4–6.5)
Neutrophils Relative %: 39 %
Platelets: 137 10*3/uL — ABNORMAL LOW (ref 150–440)
RBC: 4.28 MIL/uL — ABNORMAL LOW (ref 4.40–5.90)
RDW: 15.7 % — ABNORMAL HIGH (ref 11.5–14.5)
WBC: 2.2 10*3/uL — ABNORMAL LOW (ref 3.8–10.6)

## 2016-07-17 LAB — COMPREHENSIVE METABOLIC PANEL
ALT: 21 U/L (ref 17–63)
AST: 34 U/L (ref 15–41)
Albumin: 3.8 g/dL (ref 3.5–5.0)
Alkaline Phosphatase: 77 U/L (ref 38–126)
Anion gap: 10 (ref 5–15)
BUN: 10 mg/dL (ref 6–20)
CO2: 24 mmol/L (ref 22–32)
Calcium: 8.4 mg/dL — ABNORMAL LOW (ref 8.9–10.3)
Chloride: 103 mmol/L (ref 101–111)
Creatinine, Ser: 0.67 mg/dL (ref 0.61–1.24)
GFR calc Af Amer: >60 mL/min (ref >60)
GFR calc non Af Amer: >60 mL/min (ref >60)
Glucose, Bld: 184 mg/dL — ABNORMAL HIGH (ref 65–99)
Potassium: 3.1 mmol/L — ABNORMAL LOW (ref 3.5–5.1)
Sodium: 137 mmol/L (ref 135–145)
Total Bilirubin: 0.4 mg/dL (ref 0.3–1.2)
Total Protein: 6.9 g/dL (ref 6.5–8.1)

## 2016-07-17 LAB — MAGNESIUM: Magnesium: 1.9 mg/dL (ref 1.7–2.4)

## 2016-07-17 MED ORDER — POTASSIUM CHLORIDE ER 10 MEQ PO TBCR: 10.0000 meq | EXTENDED_RELEASE_TABLET | Freq: Two times a day (BID) | ORAL | 1 refills | Status: DC

## 2016-07-17 MED ORDER — SODIUM CHLORIDE 0.9 % IV SOLN: INTRAVENOUS | Status: DC

## 2016-07-17 MED ORDER — SODIUM CHLORIDE 0.9 % IV SOLN
20.0000 meq | Freq: Once | INTRAVENOUS | Status: AC
  Administered 2016-07-17: 20 meq via INTRAVENOUS
  Filled 2016-07-17: qty 10

## 2016-07-17 MED ORDER — TBO-FILGRASTIM 480 MCG/0.8ML ~~LOC~~ SOSY
480.0000 ug | PREFILLED_SYRINGE | Freq: Once | SUBCUTANEOUS | Status: AC
  Administered 2016-07-17: 480 ug via SUBCUTANEOUS
  Filled 2016-07-17: qty 0.8

## 2016-07-17 MED ORDER — POTASSIUM CHLORIDE ER 10 MEQ PO TBCR: EXTENDED_RELEASE_TABLET | ORAL | 1 refills | Status: DC

## 2016-07-17 MED ORDER — HEPARIN SOD (PORK) LOCK FLUSH 100 UNIT/ML IV SOLN
500.0000 [IU] | Freq: Once | INTRAVENOUS | Status: AC
  Administered 2016-07-17: 500 [IU] via INTRAVENOUS
  Filled 2016-07-17: qty 5

## 2016-07-17 MED ORDER — SODIUM CHLORIDE 0.9% FLUSH
10.0000 mL | INTRAVENOUS | Status: DC | PRN
  Administered 2016-07-17: 10 mL via INTRAVENOUS
  Filled 2016-07-17: qty 10

## 2016-07-17 NOTE — Progress Notes (Signed)
Patient states he is not sleeping well, but just does not feel sleepy.  Also states Tuesday and Wednesday he felt full.  When he feels this way and presses on his stomach he has pain.  No plain today.  States he sometimes has headaches and neck pain.  When he has this he takes pain medication which helps.

## 2016-07-17 NOTE — Progress Notes (Signed)
[facility] day:  07/17/16  Chief Complaint: Axcel Horsch is a 36 y.o. male with metastatic gastric cancer who is seen for assessment prior to day 1 of cycle #4 ramucirumab (Cyramza) + Taxol.  HPI:  The patient was last seen in the medical oncology clinic by me on 06/26/2016.  At that time, he felt good.  He denied any nausea, vomiting or abdominal pain.  He denied any neuropathy.  He noted a change in taste sensation.  He had some chronic mild knee pain.  He began cycle #3 chemotherapy.  He saw Dr Janese Banks in my absence on 07/10/2016 for day 15 chemotherapy.  WBC was 1800 with an ANC of 600.  Chemotherapy was held.  Symptomatically, he feels good. He notes a little bit of bloating. He is eating well. He denies any fevers.   Past Medical History:  Diagnosis Date  . Gastric cancer ([place]) 08/2015   Folfox chemo tx's.  Marland Kitchen GERD (gastroesophageal reflux disease)     Past Surgical History:  Procedure Laterality Date  . ESOPHAGOGASTRODUODENOSCOPY (EGD) WITH PROPOFOL N/A 08/02/2015   Procedure: ESOPHAGOGASTRODUODENOSCOPY (EGD) WITH PROPOFOL;  Surgeon: Lucilla Lame, MD;  Location: [place];  Service: Endoscopy;  Laterality: N/A;  . ESOPHAGOGASTRODUODENOSCOPY (EGD) WITH PROPOFOL N/A 02/26/2016   Procedure: ESOPHAGOGASTRODUODENOSCOPY (EGD) WITH PROPOFOL;  Surgeon: Lucilla Lame, MD;  Location: [facility];  Service: Endoscopy;  Laterality: N/A;  . NO PAST SURGERIES    . PERIPHERAL VASCULAR CATHETERIZATION N/A 08/13/2015   Procedure: Glori Luis Cath Insertion;  Surgeon: Algernon Huxley, MD;  Location: [place] [facility];  Service: Cardiovascular;  Laterality: N/A;    History reviewed. No pertinent family history.  Social History:  reports that he has never smoked. He has never used smokeless tobacco. He reports that he does not drink alcohol or use drugs.  The patient is from [place].  He speaks [place].  He moves to the [place] in 2000.  He lives  in [place] with his mother, father, brother and sister.  He is a Theme park manager.  The patient is accompanied by his sister and the interpreter today.   Allergies:  Allergies  Allergen Reactions  . Iodine     Possible anaphylaxis per MD     Current Medications: Current Outpatient Prescriptions  Medication Sig Dispense Refill  . ferrous sulfate 325 (65 FE) MG EC tablet Take 1 tablet (325 mg total) by mouth daily with breakfast. 30 tablet 3  . HYDROcodone-acetaminophen (NORCO) 7.5-325 MG tablet Take 1 tablet by mouth every 4 (four) hours as needed for moderate pain. 90 tablet 0  . hydrOXYzine (ATARAX/VISTARIL) 25 MG tablet Take 25 mg by mouth daily.    Marland Kitchen lactulose (CHRONULAC) 10 GM/15ML solution Take 30 mLs (20 g total) by mouth daily as needed for mild constipation. 120 mL 0  . levocetirizine (XYZAL) 5 MG tablet Take 5 mg by mouth every evening.    Marland Kitchen omeprazole (PRILOSEC) 20 MG capsule Take 1 capsule (20 mg total) by mouth 2 (two) times daily before a meal. 60 capsule 2  . ondansetron (ZOFRAN) 8 MG tablet Take 1 tablet (8 mg total) by mouth 2 (two) times daily as needed (Nausea or vomiting). 30 tablet 1  . potassium chloride (K-DUR) 10 MEQ tablet Take 1 tablet daily 30 tablet 1  . prochlorperazine (COMPAZINE) 10 MG tablet Take 1 tablet (10 mg total) by mouth every 6 (six) hours as needed for nausea or vomiting. 30 tablet 1  .  Diphenhyd-Hydrocort-Nystatin (FIRST-DUKES MOUTHWASH) SUSP Use as directed 10 mLs in the mouth or throat 4 (four) times daily as needed. (Patient not taking: Reported on 06/12/2016) 480 mL 3  . gabapentin (NEURONTIN) 100 MG capsule Take 1 capsule (100 mg total) by mouth 3 (three) times daily. (Patient not taking: Reported on 06/12/2016) 90 capsule 1   No current facility-administered medications for this visit.    Facility-Administered Medications Ordered in Other Visits  Medication Dose Route Frequency Provider Last Rate Last Dose  . heparin lock flush 100 unit/mL  500 Units  Intravenous Once Corcoran, Melissa C, MD      . heparin lock flush 100 unit/mL  500 Units Intravenous Once Corcoran, Melissa C, MD      . pegfilgrastim (NEULASTA) injection 6 mg  6 mg Subcutaneous Once Corcoran, Melissa C, MD      . sodium chloride 0.9 % injection 10 mL  10 mL Intravenous Once Corcoran, Melissa C, MD      . sodium chloride flush (NS) 0.9 % injection 10 mL  10 mL Intravenous PRN Lequita Asal, MD   10 mL at 08/28/15 0835  . sodium chloride flush (NS) 0.9 % injection 10 mL  10 mL Intravenous PRN Lequita Asal, MD   10 mL at 07/17/16 0914    Review of Systems:  GENERAL:  Feels "good".  No fevers or sweats.  Weight up 2 pounds. PERFORMANCE STATUS (ECOG):  1 HEENT:  No visual changes, runny nose, sore throat, mouth sores or tenderness. Lungs:  No shortness of breath or cough.  No hemoptysis. Cardiac:  No chest pain, palpitations, orthopnea, or PND. GI:    Little abdominal bloating.  Eating well.  No nausea, vomiting, diarrhea, constipation, melena or hematochezia. GU:  No urgency, frequency, dysuria, or hematuria. Musculoskeletal:  No bone pain.  No muscle tenderness.  Takes Claritin to prevent Neulasta induced bone pain. Extremities:  No pain or swelling. Skin:  Urticaria, resolved.  No rashes or ulcers. Neuro:  No neuropathy.  No headache, numbness or weakness, balance or coordination issues. Endocrine:  No diabetes, thyroid issues, hot flashes or night sweats. Psych:  No mood changes, depression or anxiety. Pain: No pain. Review of systems:  All other systems reviewed and found to be negative.  Physical Exam: Blood pressure 127/81, pulse 81, temperature 98.6 F (37 C), temperature source Tympanic, resp. rate 18, weight 187 lb 3 oz (84.9 kg). GENERAL:  Well developed, well nourished, gentleman sitting comfortably in the exam room in no acute distress. MENTAL STATUS:  Alert and oriented to person, place and time. HEAD:  Wearing a cap. Thin mustache.   Normocephalic, atraumatic, face symmetric, no Cushingoid features. EYES:  Brown eyes.  Pupils equal rounfd and reactive to light and accomodation.  No conjunctivitis or scleral icterus. ENT:  Oropharynx clear without lesion.  Tongue normal. Mucous membranes moist.  RESPIRATORY:  Clear to auscultation without rales, wheezes or rhonchi. CARDIOVASCULAR:  Regular rate and rhythm without murmur, rub or gallop. ABDOMEN:  Soft, non-tender with active bowel sounds and no hepatosplenomegaly.  No masses. SKIN:  No urticaria.  No rashes or ulcers. EXTREMITIES: No edema, no skin discoloration or tenderness.  No palpable cords. LYMPH NODES: No palpable cervical, supraclavicular, axillary or inguinal adenopathy  NEUROLOGICAL: Unremarkable. PSYCH:  Appropriate.   Imaging studies: 07/29/2015:  Chest, abdomen and pelvic CT revealed a 4 cm thickened and irregular gastric wall compatible with an infiltrative malignancy. There was extensive involvement of the upper mesentery and omentum  compatible with peritoneal carcinomatosis. There was a small amount of ascites.    08/08/2015:  Head CT revealed no evidence of metastatic disease. 10/05/2015:  Abdomen and pelvic CT revealed a positive response to therapy with decreased size of the primary mass along the lesser curvature of the stomach, decrease extension of the mass into the gastrohepatic ligament, decreasing evidence of peritoneal carcinomatosis involving predominantly the omentum, and resolution of previously noted malignant ascites. There was a potential ulcer along the greater curvature of the mid body of the stomach.   12/24/2015:  Abdomen and pelvic CT revealed further improvement in primary gastric malignancy and adjacent lymphadenopathy. There was no residual peritoneal carcinomatosis identified. 03/21/2016:  Abdominal CT revealed new small volume ascites within the abdomen extending over the liver and spleen.  There was increase soft tissue stranding  throughout the peritoneal cavity worrisome for recurrence of peritoneal carcinomatosis. 04/24/2016:  Abdomen and pelvic CT revealed progressive intraperitoneal metastatic disease with increased ascites and omental caking.   Appointment on 07/17/2016  Component Date Value Ref Range Status  . Sodium 07/17/2016 137  135 - 145 mmol/L Final  . Potassium 07/17/2016 3.1* 3.5 - 5.1 mmol/L Final  . Chloride 07/17/2016 103  101 - 111 mmol/L Final  . CO2 07/17/2016 24  22 - 32 mmol/L Final  . Glucose, Bld 07/17/2016 184* 65 - 99 mg/dL Final  . BUN 07/17/2016 10  6 - 20 mg/dL Final  . Creatinine, Ser 07/17/2016 0.67  0.61 - 1.24 mg/dL Final  . Calcium 07/17/2016 8.4* 8.9 - 10.3 mg/dL Final  . Total Protein 07/17/2016 6.9  6.5 - 8.1 g/dL Final  . Albumin 07/17/2016 3.8  3.5 - 5.0 g/dL Final  . AST 07/17/2016 34  15 - 41 U/L Final  . ALT 07/17/2016 21  17 - 63 U/L Final  . Alkaline Phosphatase 07/17/2016 77  38 - 126 U/L Final  . Total Bilirubin 07/17/2016 0.4  0.3 - 1.2 mg/dL Final  . GFR calc non Af Amer 07/17/2016 >60  >60 mL/min Final  . GFR calc Af Amer 07/17/2016 >60  >60 mL/min Final   Comment: (NOTE) The eGFR has been calculated using the CKD EPI equation. This calculation has not been validated in all clinical situations. eGFR's persistently <60 mL/min signify possible Chronic Kidney Disease.   . Anion gap 07/17/2016 10  5 - 15 Final  . Magnesium 07/17/2016 1.9  1.7 - 2.4 mg/dL Final  . WBC 07/17/2016 2.2* 3.8 - 10.6 K/uL Final  . RBC 07/17/2016 4.28* 4.40 - 5.90 MIL/uL Final  . Hemoglobin 07/17/2016 12.4* 13.0 - 18.0 g/dL Final  . HCT 07/17/2016 35.7* 40.0 - 52.0 % Final  . MCV 07/17/2016 83.4  80.0 - 100.0 fL Final  . MCH 07/17/2016 29.0  26.0 - 34.0 pg Final  . MCHC 07/17/2016 34.7  32.0 - 36.0 g/dL Final  . RDW 07/17/2016 15.7* 11.5 - 14.5 % Final  . Platelets 07/17/2016 137* 150 - 440 K/uL Final  . Neutrophils Relative % 07/17/2016 39  % Final  . Neutro Abs 07/17/2016 0.9* 1.4  - 6.5 K/uL Final  . Lymphocytes Relative 07/17/2016 41  % Final  . Lymphs Abs 07/17/2016 0.9* 1.0 - 3.6 K/uL Final  . Monocytes Relative 07/17/2016 17  % Final  . Monocytes Absolute 07/17/2016 0.4  0.2 - 1.0 K/uL Final  . Eosinophils Relative 07/17/2016 2  % Final  . Eosinophils Absolute 07/17/2016 0.0  0 - 0.7 K/uL Final  . Basophils Relative 07/17/2016  1  % Final  . Basophils Absolute 07/17/2016 0.0  0 - 0.1 K/uL Final    Assessment:  Yaden Seith is a 36 y.o. male with metastatic gastric cancer.  He presented with a 4-6 week history of progressive epigastric pain.  EGD on 08/02/2015 revealed a large, ulcerated, non-circumferential mass with oozing and stigmata of recent bleeding in the entire examined stomach.  The mass involved the fundus down to the antrum.  Biopsies revealed poorly differentiated adenocarcinoma.  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  Her2/neu testing was negative.  Microsatellite testing was negative.  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  PDL-1 was expressed.  CEA was 0.3 on 07/30/2015.  Chest, abdomen and pelvic CT scan on 07/29/2015 revealed a 4 cm thickened and irregular gastric wall compatible with an infiltrative malignancy. There was extensive involvement of the upper mesentery and omentum compatible with peritoneal carcinomatosis. There was a small amount of ascites.     He completed treatment for H pylori  (began 08/09/2015).  Stool was negative for H pylori on 11/01/2015.  He has iron deficiency anemia likely gastric oozing and modest diet.  RBC are microcytic.  Labs on 08/21/2015 revealed a ferritin was 44 with an iron saturation 4% and a TIBC of 395.  Ferritin was 116 on 09/18/2015.  He received 12 cycles of FOLFOX chemotherapy (08/14/2015 - 02/05/2016).  Chemotherapy was  held on 10/23/2015 secondary to thrombocytopenia (87,000).  Platelet count was 90,000 prior to cycle #11.  He has tolerated his chemotherapy well without diarrhea, mouth sores, or neuropathy.  He received 3 cycles of maintenance 5FU/LV (03/03/2016 - 04/08/2016).  He is s/p 3 cycles of ramucirumab + Taxol (05/01/2016 - 06/26/2016).  He notes slight transient tingling in his fingertips.  Chemotherapy was held on day 15 of cycle #3 secondary to neutropenia (ANC 600).  EGD on 02/26/2016 revealed one non-bleeding gastric ulcer.   The mass appeared smaller.  Biopsy revealed moderate chronic gastritis and edema, negative for H pylori, dysplasia and malignancy.  Abdomen and pelvic CT on 04/24/2016 revealed progressive intraperitoneal metastatic disease with increased ascites and omental caking.  Abdominal and pelvic CT on 06/17/2016 revealed decreased omental metastatic disease.  The volume of ascites had decreased.  He has a history of urticaria of unclear etiology.  He is followed by dermatology.  He takes Zyrtec every morning and hydroxyzine at night.  He takes Xyzal 5 mg daily.  He was instructed to use mild soaps and not to use perfumes.   Symptomatically, he has intermittent bloating.  Exam is normal.  ANC is 900.  Potassium is low (3.1).  Plan: 1.  Labs today:  CBC with diff, CMP, Mg. 2.  Discuss counts.  Patient still neutropenic after holding chemotherapy x 1 week.  Discuss use of GCSF/Granix. 3.  No chemotherapy today. 4.  Granix 480 mcg SQ today. 5.  Potassium chloride 20 meq IV. 6.  Refill potassium.  Begin 10 meq BID. 7.  RTC tomorrow for CBC with diff +/- Granix. 8.  RTC on 07/22/2016 for MD assessment, labs (CBC with diff, CMP, Mg), day 1 of cycle #4 Taxol and ramicurimab.   Lequita Asal, MD  07/17/2016, 9:52 AM

## 2016-07-18 ENCOUNTER — Inpatient Hospital Stay: Payer: Self-pay

## 2016-07-18 ENCOUNTER — Other Ambulatory Visit: Payer: Self-pay

## 2016-07-18 ENCOUNTER — Other Ambulatory Visit: Payer: Self-pay | Admitting: Hematology and Oncology

## 2016-07-18 DIAGNOSIS — E876 Hypokalemia: Secondary | ICD-10-CM

## 2016-07-18 DIAGNOSIS — C801 Malignant (primary) neoplasm, unspecified: Secondary | ICD-10-CM

## 2016-07-18 DIAGNOSIS — C786 Secondary malignant neoplasm of retroperitoneum and peritoneum: Secondary | ICD-10-CM

## 2016-07-18 DIAGNOSIS — C162 Malignant neoplasm of body of stomach: Secondary | ICD-10-CM

## 2016-07-18 DIAGNOSIS — Z7189 Other specified counseling: Secondary | ICD-10-CM

## 2016-07-18 DIAGNOSIS — Z5111 Encounter for antineoplastic chemotherapy: Secondary | ICD-10-CM

## 2016-07-18 LAB — CBC WITH DIFFERENTIAL/PLATELET
Basophils Absolute: 0.1 10*3/uL (ref 0–0.1)
Basophils Relative: 1 %
Eosinophils Absolute: 0.1 10*3/uL (ref 0–0.7)
Eosinophils Relative: 1 %
HCT: 38.2 % — ABNORMAL LOW (ref 40.0–52.0)
Hemoglobin: 13.1 g/dL (ref 13.0–18.0)
Lymphocytes Relative: 15 %
Lymphs Abs: 1.6 10*3/uL (ref 1.0–3.6)
MCH: 29 pg (ref 26.0–34.0)
MCHC: 34.3 g/dL (ref 32.0–36.0)
MCV: 84.5 fL (ref 80.0–100.0)
Monocytes Absolute: 1.3 10*3/uL — ABNORMAL HIGH (ref 0.2–1.0)
Monocytes Relative: 12 %
Neutro Abs: 7.6 10*3/uL — ABNORMAL HIGH (ref 1.4–6.5)
Neutrophils Relative %: 71 %
Platelets: 142 10*3/uL — ABNORMAL LOW (ref 150–440)
RBC: 4.53 MIL/uL (ref 4.40–5.90)
RDW: 16.8 % — ABNORMAL HIGH (ref 11.5–14.5)
WBC: 10.6 10*3/uL (ref 3.8–10.6)

## 2016-07-21 DIAGNOSIS — T451X5A Adverse effect of antineoplastic and immunosuppressive drugs, initial encounter: Secondary | ICD-10-CM

## 2016-07-21 DIAGNOSIS — D701 Agranulocytosis secondary to cancer chemotherapy: Secondary | ICD-10-CM | POA: Insufficient documentation

## 2016-07-21 NOTE — Progress Notes (Signed)
[facility] day:  07/22/16  Chief Complaint: Anthony Melendez is a 36 y.o. male with metastatic gastric cancer who is seen for assessment prior to day 1 of cycle #4 ramucirumab (Cyramza) + Taxol.  HPI:  The patient was last seen in the medical oncology clinic on 07/17/2016.  At that time, he felt good.  He described intermittent bloating.  ANC was 900 after being off chemotherapy for 1 week.  Chemotherapy was held.  He received Granix 480 mcg SQ.  WBC was 10,600 with an [place] of 7,600.    Symptomatically, he feels "so-so".  He notes a slight full sensation in his abdomen.  Sometimes he feels a little sensitivity in his fingertips.  He noted a little pain in his lower back after GCSF.  He plans to go to [facility] 06/30 - 08/09/2016 for a vacation.   Past Medical History:  Diagnosis Date  . Gastric cancer ([place]) 08/2015   Folfox chemo tx's.  Marland Kitchen GERD (gastroesophageal reflux disease)     Past Surgical History:  Procedure Laterality Date  . ESOPHAGOGASTRODUODENOSCOPY (EGD) WITH PROPOFOL N/A 08/02/2015   Procedure: ESOPHAGOGASTRODUODENOSCOPY (EGD) WITH PROPOFOL;  Surgeon: Lucilla Lame, MD;  Location: [facility];  Service: Endoscopy;  Laterality: N/A;  . ESOPHAGOGASTRODUODENOSCOPY (EGD) WITH PROPOFOL N/A 02/26/2016   Procedure: ESOPHAGOGASTRODUODENOSCOPY (EGD) WITH PROPOFOL;  Surgeon: Lucilla Lame, MD;  Location: [facility];  Service: Endoscopy;  Laterality: N/A;  . NO PAST SURGERIES    . PERIPHERAL VASCULAR CATHETERIZATION N/A 08/13/2015   Procedure: Glori Luis Cath Insertion;  Surgeon: Algernon Huxley, MD;  Location: [facility];  Service: Cardiovascular;  Laterality: N/A;    History reviewed. No pertinent family history.  Social History:  reports that he has never smoked. He has never used smokeless tobacco. He reports that he does not drink alcohol or use drugs.  The patient is from [place].  He speaks [place].  He moves to the [place]  in 2000.  He lives in [place] with his mother, father, brother and sister.  He is a Theme park manager.  He plans to go to [facility] 06/30 - 08/09/2016 for a vacation.  The patient is accompanied by his sister and the interpreter today.   Allergies:  Allergies  Allergen Reactions  . Iodine     Possible anaphylaxis per MD     Current Medications: Current Outpatient Prescriptions  Medication Sig Dispense Refill  . ferrous sulfate 325 (65 FE) MG EC tablet Take 1 tablet (325 mg total) by mouth daily with breakfast. 30 tablet 3  . HYDROcodone-acetaminophen (NORCO) 7.5-325 MG tablet Take 1 tablet by mouth every 4 (four) hours as needed for moderate pain. 90 tablet 0  . hydrOXYzine (ATARAX/VISTARIL) 25 MG tablet Take 25 mg by mouth daily.    Marland Kitchen lactulose (CHRONULAC) 10 GM/15ML solution Take 30 mLs (20 g total) by mouth daily as needed for mild constipation. 120 mL 0  . levocetirizine (XYZAL) 5 MG tablet Take 5 mg by mouth every evening.    Marland Kitchen omeprazole (PRILOSEC) 20 MG capsule Take 1 capsule (20 mg total) by mouth 2 (two) times daily before a meal. 60 capsule 2  . ondansetron (ZOFRAN) 8 MG tablet Take 1 tablet (8 mg total) by mouth 2 (two) times daily as needed (Nausea or vomiting). 30 tablet 1  . potassium chloride (K-DUR) 10 MEQ tablet Take 1 tablet (10 mEq total) by mouth 2 (two) times daily. Take 1 tablet daily 60 tablet 1  .  prochlorperazine (COMPAZINE) 10 MG tablet Take 1 tablet (10 mg total) by mouth every 6 (six) hours as needed for nausea or vomiting. 30 tablet 1  . Diphenhyd-Hydrocort-Nystatin (FIRST-DUKES MOUTHWASH) SUSP Use as directed 10 mLs in the mouth or throat 4 (four) times daily as needed. (Patient not taking: Reported on 06/12/2016) 480 mL 3  . gabapentin (NEURONTIN) 100 MG capsule Take 1 capsule (100 mg total) by mouth 3 (three) times daily. (Patient not taking: Reported on 06/12/2016) 90 capsule 1   No current facility-administered medications for this visit.    Facility-Administered  Medications Ordered in Other Visits  Medication Dose Route Frequency Provider Last Rate Last Dose  . heparin lock flush 100 unit/mL  500 Units Intravenous Once Asyia Hornung C, MD      . heparin lock flush 100 unit/mL  500 Units Intravenous Once Jentzen Minasyan C, MD      . pegfilgrastim (NEULASTA) injection 6 mg  6 mg Subcutaneous Once Shatonya Passon C, MD      . sodium chloride 0.9 % injection 10 mL  10 mL Intravenous Once Saverio Kader C, MD      . sodium chloride flush (NS) 0.9 % injection 10 mL  10 mL Intravenous PRN Rosey Bath, MD   10 mL at 08/28/15 4339    Review of Systems:  GENERAL:  Feels "so-so".  No fevers or sweats.  Weight stable. PERFORMANCE STATUS (ECOG):  1 HEENT:  No visual changes, runny nose, sore throat, mouth sores or tenderness. Lungs:  No shortness of breath or cough.  No hemoptysis. Cardiac:  No chest pain, palpitations, orthopnea, or PND. GI:    Little abdominal fullness.  Eating well.  No nausea, vomiting, diarrhea, constipation, melena or hematochezia. GU:  No urgency, frequency, dysuria, or hematuria. Musculoskeletal:  Back pain s/p GCSF.  No bone pain.  No muscle tenderness.  Takes Claritin to prevent Neulasta induced bone pain. Extremities:  No pain or swelling. Skin:  Urticaria, resolved.  No rashes or ulcers. Neuro:  Little sensitivity in fingertips sometimes.  No headache, numbness or weakness, balance or coordination issues. Endocrine:  No diabetes, thyroid issues, hot flashes or night sweats. Psych:  No mood changes, depression or anxiety. Pain: No pain. Review of systems:  All other systems reviewed and found to be negative.  Physical Exam: Blood pressure 130/89, pulse 80, temperature 98.7 F (37.1 C), temperature source Tympanic, resp. rate 18, weight 187 lb (84.8 kg). GENERAL:  Well developed, well nourished, gentleman sitting comfortably in the exam room in no acute distress. MENTAL STATUS:  Alert and oriented to person,  place and time. HEAD:  Wearing a cap. Thin mustache.  Normocephalic, atraumatic, face symmetric, no Cushingoid features. EYES:  Brown eyes.  Pupils equal rounfd and reactive to light and accomodation.  No conjunctivitis or scleral icterus. ENT:  Oropharynx clear without lesion.  Tongue normal. Mucous membranes moist.  RESPIRATORY:  Clear to auscultation without rales, wheezes or rhonchi. CARDIOVASCULAR:  Regular rate and rhythm without murmur, rub or gallop. ABDOMEN:  Soft, non-tender with active bowel sounds and no hepatosplenomegaly.  No masses. SKIN:  No urticaria.  No rashes or ulcers. EXTREMITIES: No edema, no skin discoloration or tenderness.  No palpable cords. LYMPH NODES: No palpable cervical, supraclavicular, axillary or inguinal adenopathy  NEUROLOGICAL: Unremarkable. PSYCH:  Appropriate.   Imaging studies: 07/29/2015:  Chest, abdomen and pelvic CT revealed a 4 cm thickened and irregular gastric wall compatible with an infiltrative malignancy. There was extensive involvement of  the upper mesentery and omentum compatible with peritoneal carcinomatosis. There was a small amount of ascites.    08/08/2015:  Head CT revealed no evidence of metastatic disease. 10/05/2015:  Abdomen and pelvic CT revealed a positive response to therapy with decreased size of the primary mass along the lesser curvature of the stomach, decrease extension of the mass into the gastrohepatic ligament, decreasing evidence of peritoneal carcinomatosis involving predominantly the omentum, and resolution of previously noted malignant ascites. There was a potential ulcer along the greater curvature of the mid body of the stomach.   12/24/2015:  Abdomen and pelvic CT revealed further improvement in primary gastric malignancy and adjacent lymphadenopathy. There was no residual peritoneal carcinomatosis identified. 03/21/2016:  Abdominal CT revealed new small volume ascites within the abdomen extending over the liver and  spleen.  There was increase soft tissue stranding throughout the peritoneal cavity worrisome for recurrence of peritoneal carcinomatosis. 04/24/2016:  Abdomen and pelvic CT revealed progressive intraperitoneal metastatic disease with increased ascites and omental caking.   Infusion on 07/22/2016  Component Date Value Ref Range Status  . WBC 07/22/2016 5.6  3.8 - 10.6 K/uL Final  . RBC 07/22/2016 4.69  4.40 - 5.90 MIL/uL Final  . Hemoglobin 07/22/2016 13.7  13.0 - 18.0 g/dL Final  . HCT 07/07/9329 39.6* 40.0 - 52.0 % Final  . MCV 07/22/2016 84.4  80.0 - 100.0 fL Final  . MCH 07/22/2016 29.3  26.0 - 34.0 pg Final  . MCHC 07/22/2016 34.7  32.0 - 36.0 g/dL Final  . RDW 99/19/0607 16.9* 11.5 - 14.5 % Final  . Platelets 07/22/2016 130* 150 - 440 K/uL Final  . Neutrophils Relative % 07/22/2016 62  % Final  . Neutro Abs 07/22/2016 3.4  1.4 - 6.5 K/uL Final  . Lymphocytes Relative 07/22/2016 26  % Final  . Lymphs Abs 07/22/2016 1.5  1.0 - 3.6 K/uL Final  . Monocytes Relative 07/22/2016 10  % Final  . Monocytes Absolute 07/22/2016 0.6  0.2 - 1.0 K/uL Final  . Eosinophils Relative 07/22/2016 1  % Final  . Eosinophils Absolute 07/22/2016 0.1  0 - 0.7 K/uL Final  . Basophils Relative 07/22/2016 1  % Final  . Basophils Absolute 07/22/2016 0.1  0 - 0.1 K/uL Final    Assessment:  Thornton Dohrmann is a 36 y.o. male with metastatic gastric cancer.  He presented with a 4-6 week history of progressive epigastric pain.  EGD on 08/02/2015 revealed a large, ulcerated, non-circumferential mass with oozing and stigmata of recent bleeding in the entire examined stomach.  The mass involved the fundus down to the antrum.  Biopsies revealed poorly differentiated adenocarcinoma.  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  Her2/neu testing was negative.  Microsatellite testing was negative.  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  The MSH2 gene is associated  with autosomal dominant Lynch syndrome (hereditary nonpolyposis colorectal cancer syndrome).  PDL-1 was expressed.  CEA was 0.3 on 07/30/2015.  Chest, abdomen and pelvic CT scan on 07/29/2015 revealed a 4 cm thickened and irregular gastric wall compatible with an infiltrative malignancy. There was extensive involvement of the upper mesentery and omentum compatible with peritoneal carcinomatosis. There was a small amount of ascites.     He completed treatment for H pylori  (began 08/09/2015).  Stool was negative for H pylori on 11/01/2015.  He has iron deficiency anemia likely gastric oozing and modest diet.  RBC are microcytic.  Labs on 08/21/2015 revealed a ferritin  was 44 with an iron saturation 4% and a TIBC of 395.  Ferritin was 116 on 09/18/2015.  He received 12 cycles of FOLFOX chemotherapy (08/14/2015 - 02/05/2016).  Chemotherapy was held on 10/23/2015 secondary to thrombocytopenia (87,000).  Platelet count was 90,000 prior to cycle #11.  He has tolerated his chemotherapy well without diarrhea, mouth sores, or neuropathy.  He received 3 cycles of maintenance 5FU/LV (03/03/2016 - 04/08/2016).  He is s/p 3 cycles of ramucirumab + Taxol (05/01/2016 - 06/26/2016).  He notes slight transient tingling in his fingertips.  Chemotherapy was held on day 15 of cycle #3 secondary to neutropenia (ANC 600).  EGD on 02/26/2016 revealed one non-bleeding gastric ulcer.   The mass appeared smaller.  Biopsy revealed moderate chronic gastritis and edema, negative for H pylori, dysplasia and malignancy.  Abdomen and pelvic CT on 04/24/2016 revealed progressive intraperitoneal metastatic disease with increased ascites and omental caking.  Abdominal and pelvic CT on 06/17/2016 revealed decreased omental metastatic disease.  The volume of ascites had decreased.  He has a history of urticaria of unclear etiology.  He is followed by dermatology.  He takes Zyrtec every morning and hydroxyzine at night.  He takes Xyzal 5  mg daily.  He was instructed to use mild soaps and not to use perfumes.   Symptomatically, he has intermittent abdominal fullness and slight fingertip sensitivity.  Exam is normal.  ANC is 3400.  Platelet count is 130,000.  Potassium is normal (3.6).  Plan: 1.  Labs today:  CBC with diff, CMP, Mg. 2.  Discuss response to GCSF/Granix.  Discuss Granix if WBC < 3,000 or ANC < 1500. 3.  Discuss plans for week of vacation during this cycle of chemotherapy (06/30 - 07/07). 4.  Day 1 of cycle #4 Taxol and ramucirumab today. 5.  RTC in 1 week for labs (CBC with diff, CMP) and day 8 of cycle #4 Taxol. 6.  RTC in 3 weeks for labs (CBC with diff, CMP, Mg), and day 15 of cycle #4 Taxol and ramucirumab. 7.  RTC in 4 weeks for labs (CBC with diff). 8.  RTC in 5 weeks for MD assessment, labs (CBC with diff, CMP, Mg), and day 1 of cycle #5 Taxol and ramucirumab.   Lequita Asal, MD  07/22/2016, 8:59 AM

## 2016-07-22 ENCOUNTER — Inpatient Hospital Stay: Payer: Self-pay

## 2016-07-22 ENCOUNTER — Inpatient Hospital Stay (HOSPITAL_BASED_OUTPATIENT_CLINIC_OR_DEPARTMENT_OTHER): Payer: Self-pay | Admitting: Hematology and Oncology

## 2016-07-22 ENCOUNTER — Encounter: Payer: Self-pay | Admitting: Hematology and Oncology

## 2016-07-22 VITALS — BP 118/69 | HR 76 | Resp 20

## 2016-07-22 VITALS — BP 130/89 | HR 80 | Temp 98.7°F | Resp 18 | Wt 187.0 lb

## 2016-07-22 DIAGNOSIS — Z79899 Other long term (current) drug therapy: Secondary | ICD-10-CM

## 2016-07-22 DIAGNOSIS — C786 Secondary malignant neoplasm of retroperitoneum and peritoneum: Secondary | ICD-10-CM

## 2016-07-22 DIAGNOSIS — C162 Malignant neoplasm of body of stomach: Secondary | ICD-10-CM

## 2016-07-22 DIAGNOSIS — Z7689 Persons encountering health services in other specified circumstances: Secondary | ICD-10-CM

## 2016-07-22 DIAGNOSIS — D701 Agranulocytosis secondary to cancer chemotherapy: Secondary | ICD-10-CM

## 2016-07-22 DIAGNOSIS — Z8719 Personal history of other diseases of the digestive system: Secondary | ICD-10-CM

## 2016-07-22 DIAGNOSIS — Z5111 Encounter for antineoplastic chemotherapy: Secondary | ICD-10-CM

## 2016-07-22 DIAGNOSIS — D509 Iron deficiency anemia, unspecified: Secondary | ICD-10-CM

## 2016-07-22 DIAGNOSIS — L509 Urticaria, unspecified: Secondary | ICD-10-CM

## 2016-07-22 DIAGNOSIS — K219 Gastro-esophageal reflux disease without esophagitis: Secondary | ICD-10-CM

## 2016-07-22 DIAGNOSIS — C801 Malignant (primary) neoplasm, unspecified: Secondary | ICD-10-CM

## 2016-07-22 DIAGNOSIS — R188 Other ascites: Secondary | ICD-10-CM

## 2016-07-22 DIAGNOSIS — G893 Neoplasm related pain (acute) (chronic): Secondary | ICD-10-CM

## 2016-07-22 DIAGNOSIS — E876 Hypokalemia: Secondary | ICD-10-CM

## 2016-07-22 DIAGNOSIS — Z7189 Other specified counseling: Secondary | ICD-10-CM

## 2016-07-22 LAB — CBC WITH DIFFERENTIAL/PLATELET
Basophils Absolute: 0.1 10*3/uL (ref 0–0.1)
Basophils Relative: 1 %
Eosinophils Absolute: 0.1 10*3/uL (ref 0–0.7)
Eosinophils Relative: 1 %
HCT: 39.6 % — ABNORMAL LOW (ref 40.0–52.0)
Hemoglobin: 13.7 g/dL (ref 13.0–18.0)
Lymphocytes Relative: 26 %
Lymphs Abs: 1.5 10*3/uL (ref 1.0–3.6)
MCH: 29.3 pg (ref 26.0–34.0)
MCHC: 34.7 g/dL (ref 32.0–36.0)
MCV: 84.4 fL (ref 80.0–100.0)
Monocytes Absolute: 0.6 10*3/uL (ref 0.2–1.0)
Monocytes Relative: 10 %
Neutro Abs: 3.4 10*3/uL (ref 1.4–6.5)
Neutrophils Relative %: 62 %
Platelets: 130 10*3/uL — ABNORMAL LOW (ref 150–440)
RBC: 4.69 MIL/uL (ref 4.40–5.90)
RDW: 16.9 % — ABNORMAL HIGH (ref 11.5–14.5)
WBC: 5.6 10*3/uL (ref 3.8–10.6)

## 2016-07-22 LAB — COMPREHENSIVE METABOLIC PANEL
ALT: 18 U/L (ref 17–63)
AST: 34 U/L (ref 15–41)
Albumin: 3.6 g/dL (ref 3.5–5.0)
Alkaline Phosphatase: 92 U/L (ref 38–126)
Anion gap: 7 (ref 5–15)
BUN: 8 mg/dL (ref 6–20)
CO2: 25 mmol/L (ref 22–32)
Calcium: 8.3 mg/dL — ABNORMAL LOW (ref 8.9–10.3)
Chloride: 108 mmol/L (ref 101–111)
Creatinine, Ser: 0.66 mg/dL (ref 0.61–1.24)
GFR calc Af Amer: >60 mL/min (ref >60)
GFR calc non Af Amer: >60 mL/min (ref >60)
Glucose, Bld: 145 mg/dL — ABNORMAL HIGH (ref 65–99)
Potassium: 3.6 mmol/L (ref 3.5–5.1)
Sodium: 140 mmol/L (ref 135–145)
Total Bilirubin: 0.7 mg/dL (ref 0.3–1.2)
Total Protein: 7.1 g/dL (ref 6.5–8.1)

## 2016-07-22 LAB — MAGNESIUM: Magnesium: 2.1 mg/dL (ref 1.7–2.4)

## 2016-07-22 MED ORDER — PACLITAXEL CHEMO INJECTION 300 MG/50ML
80.0000 mg/m2 | Freq: Once | INTRAVENOUS | Status: AC
  Administered 2016-07-22: 162 mg via INTRAVENOUS
  Filled 2016-07-22: qty 27

## 2016-07-22 MED ORDER — DEXAMETHASONE SODIUM PHOSPHATE 10 MG/ML IJ SOLN
10.0000 mg | Freq: Once | INTRAMUSCULAR | Status: AC
  Administered 2016-07-22: 10 mg via INTRAVENOUS
  Filled 2016-07-22: qty 1

## 2016-07-22 MED ORDER — DIPHENHYDRAMINE HCL 50 MG/ML IJ SOLN
50.0000 mg | Freq: Once | INTRAMUSCULAR | Status: AC
  Administered 2016-07-22: 50 mg via INTRAVENOUS
  Filled 2016-07-22: qty 1

## 2016-07-22 MED ORDER — HEPARIN SOD (PORK) LOCK FLUSH 100 UNIT/ML IV SOLN: 500.0000 [IU] | Freq: Once | INTRAVENOUS | Status: AC

## 2016-07-22 MED ORDER — SODIUM CHLORIDE 0.9 % IV SOLN
Freq: Once | INTRAVENOUS | Status: AC
  Administered 2016-07-22: 10:00:00 via INTRAVENOUS
  Filled 2016-07-22: qty 1000

## 2016-07-22 MED ORDER — ACETAMINOPHEN 325 MG PO TABS
650.0000 mg | ORAL_TABLET | Freq: Once | ORAL | Status: AC
  Administered 2016-07-22: 650 mg via ORAL
  Filled 2016-07-22: qty 2

## 2016-07-22 MED ORDER — HEPARIN SOD (PORK) LOCK FLUSH 100 UNIT/ML IV SOLN
500.0000 [IU] | Freq: Once | INTRAVENOUS | Status: AC | PRN
Start: 2016-07-22 — End: 2016-07-22
  Administered 2016-07-22: 500 [IU]
  Filled 2016-07-22: qty 5

## 2016-07-22 MED ORDER — SODIUM CHLORIDE 0.9 % IV SOLN
8.0000 mg/kg | Freq: Once | INTRAVENOUS | Status: AC
  Administered 2016-07-22: 700 mg via INTRAVENOUS
  Filled 2016-07-22: qty 50

## 2016-07-22 MED ORDER — SODIUM CHLORIDE 0.9% FLUSH
10.0000 mL | Freq: Once | INTRAVENOUS | Status: AC
  Administered 2016-07-22: 10 mL via INTRAVENOUS
  Filled 2016-07-22: qty 10

## 2016-07-22 MED ORDER — FAMOTIDINE IN NACL 20-0.9 MG/50ML-% IV SOLN
20.0000 mg | Freq: Once | INTRAVENOUS | Status: AC
  Administered 2016-07-22: 20 mg via INTRAVENOUS
  Filled 2016-07-22: qty 50

## 2016-07-22 NOTE — Progress Notes (Signed)
Patient states his appetite has decreased due to feeling full quicker.

## 2016-07-24 ENCOUNTER — Other Ambulatory Visit: Payer: Self-pay

## 2016-07-24 ENCOUNTER — Telehealth: Payer: Self-pay | Admitting: *Deleted

## 2016-07-24 ENCOUNTER — Ambulatory Visit: Payer: Self-pay

## 2016-07-24 ENCOUNTER — Ambulatory Visit: Payer: Self-pay | Admitting: Hematology and Oncology

## 2016-07-24 NOTE — Telephone Encounter (Signed)
CAlled to ask what to do about his abd pain, when I asked if he is taking his medication and if he needs a refill, I was told he takes it when he has pain and that he has some left. I advised that he use it every four hours as needed and if that does not help a-to call back.

## 2016-07-29 ENCOUNTER — Other Ambulatory Visit: Payer: Self-pay | Admitting: Oncology

## 2016-07-29 ENCOUNTER — Inpatient Hospital Stay: Payer: Self-pay

## 2016-07-29 VITALS — BP 110/75 | HR 76 | Temp 97.1°F | Resp 16

## 2016-07-29 DIAGNOSIS — Z5111 Encounter for antineoplastic chemotherapy: Secondary | ICD-10-CM

## 2016-07-29 DIAGNOSIS — C162 Malignant neoplasm of body of stomach: Secondary | ICD-10-CM

## 2016-07-29 DIAGNOSIS — C801 Malignant (primary) neoplasm, unspecified: Secondary | ICD-10-CM

## 2016-07-29 DIAGNOSIS — C786 Secondary malignant neoplasm of retroperitoneum and peritoneum: Secondary | ICD-10-CM

## 2016-07-29 LAB — COMPREHENSIVE METABOLIC PANEL
ALT: 26 U/L (ref 17–63)
AST: 38 U/L (ref 15–41)
Albumin: 3.8 g/dL (ref 3.5–5.0)
Alkaline Phosphatase: 85 U/L (ref 38–126)
Anion gap: 6 (ref 5–15)
BUN: 9 mg/dL (ref 6–20)
CO2: 26 mmol/L (ref 22–32)
Calcium: 8.7 mg/dL — ABNORMAL LOW (ref 8.9–10.3)
Chloride: 106 mmol/L (ref 101–111)
Creatinine, Ser: 0.61 mg/dL (ref 0.61–1.24)
GFR calc Af Amer: >60 mL/min (ref >60)
GFR calc non Af Amer: >60 mL/min (ref >60)
Glucose, Bld: 134 mg/dL — ABNORMAL HIGH (ref 65–99)
Potassium: 3.9 mmol/L (ref 3.5–5.1)
Sodium: 138 mmol/L (ref 135–145)
Total Bilirubin: 0.8 mg/dL (ref 0.3–1.2)
Total Protein: 7.1 g/dL (ref 6.5–8.1)

## 2016-07-29 LAB — CBC WITH DIFFERENTIAL/PLATELET
Basophils Absolute: 0.1 10*3/uL (ref 0–0.1)
Basophils Relative: 2 %
Eosinophils Absolute: 0 10*3/uL (ref 0–0.7)
Eosinophils Relative: 2 %
HCT: 36.4 % — ABNORMAL LOW (ref 40.0–52.0)
Hemoglobin: 12.6 g/dL — ABNORMAL LOW (ref 13.0–18.0)
Lymphocytes Relative: 39 %
Lymphs Abs: 1.2 10*3/uL (ref 1.0–3.6)
MCH: 29 pg (ref 26.0–34.0)
MCHC: 34.7 g/dL (ref 32.0–36.0)
MCV: 83.6 fL (ref 80.0–100.0)
Monocytes Absolute: 0.1 10*3/uL — ABNORMAL LOW (ref 0.2–1.0)
Monocytes Relative: 5 %
Neutro Abs: 1.6 10*3/uL (ref 1.4–6.5)
Neutrophils Relative %: 52 %
Platelets: 132 10*3/uL — ABNORMAL LOW (ref 150–440)
RBC: 4.35 MIL/uL — ABNORMAL LOW (ref 4.40–5.90)
RDW: 16.6 % — ABNORMAL HIGH (ref 11.5–14.5)
WBC: 3.1 10*3/uL — ABNORMAL LOW (ref 3.8–10.6)

## 2016-07-29 MED ORDER — SODIUM CHLORIDE 0.9 % IV SOLN
Freq: Once | INTRAVENOUS | Status: AC
Start: 2016-07-29 — End: 2016-07-29
  Administered 2016-07-29: 10:00:00 via INTRAVENOUS
  Filled 2016-07-29: qty 1000

## 2016-07-29 MED ORDER — PACLITAXEL CHEMO INJECTION 300 MG/50ML: 80.0000 mg/m2 | Freq: Once | INTRAVENOUS | Status: DC

## 2016-07-29 MED ORDER — DIPHENHYDRAMINE HCL 50 MG/ML IJ SOLN
50.0000 mg | Freq: Once | INTRAMUSCULAR | Status: AC
  Administered 2016-07-29: 50 mg via INTRAVENOUS
  Filled 2016-07-29: qty 1

## 2016-07-29 MED ORDER — FAMOTIDINE IN NACL 20-0.9 MG/50ML-% IV SOLN
20.0000 mg | Freq: Once | INTRAVENOUS | Status: AC
  Administered 2016-07-29: 20 mg via INTRAVENOUS
  Filled 2016-07-29: qty 50

## 2016-07-29 MED ORDER — HEPARIN SOD (PORK) LOCK FLUSH 100 UNIT/ML IV SOLN
500.0000 [IU] | Freq: Once | INTRAVENOUS | Status: AC | PRN
  Administered 2016-07-29: 500 [IU]

## 2016-07-29 MED ORDER — SODIUM CHLORIDE 0.9% FLUSH
10.0000 mL | Freq: Once | INTRAVENOUS | Status: AC
  Administered 2016-07-29: 10 mL via INTRAVENOUS
  Filled 2016-07-29: qty 10

## 2016-07-29 MED ORDER — HEPARIN SOD (PORK) LOCK FLUSH 100 UNIT/ML IV SOLN
500.0000 [IU] | Freq: Once | INTRAVENOUS | Status: DC
Start: 2016-07-29 — End: 2016-07-29
  Filled 2016-07-29: qty 5

## 2016-07-29 MED ORDER — SODIUM CHLORIDE 0.9 % IV SOLN
80.0000 mg/m2 | Freq: Once | INTRAVENOUS | Status: AC
  Administered 2016-07-29: 162 mg via INTRAVENOUS
  Filled 2016-07-29: qty 27

## 2016-07-29 MED ORDER — DEXAMETHASONE SODIUM PHOSPHATE 10 MG/ML IJ SOLN
10.0000 mg | Freq: Once | INTRAMUSCULAR | Status: AC
  Administered 2016-07-29: 10 mg via INTRAVENOUS
  Filled 2016-07-29: qty 1

## 2016-07-29 NOTE — Progress Notes (Signed)
Per Dr. Janese Banks, patient to only receive taxol and premeds today.

## 2016-07-29 NOTE — Progress Notes (Signed)
Per MD, Patient is to receive  Taxol only today

## 2016-08-05 ENCOUNTER — Ambulatory Visit: Payer: Self-pay

## 2016-08-05 ENCOUNTER — Other Ambulatory Visit: Payer: Self-pay

## 2016-08-12 ENCOUNTER — Ambulatory Visit: Payer: Self-pay

## 2016-08-12 ENCOUNTER — Other Ambulatory Visit: Payer: Self-pay | Admitting: Hematology and Oncology

## 2016-08-12 ENCOUNTER — Other Ambulatory Visit: Payer: Self-pay

## 2016-08-12 ENCOUNTER — Inpatient Hospital Stay: Payer: Self-pay

## 2016-08-12 ENCOUNTER — Inpatient Hospital Stay: Payer: Self-pay | Attending: Hematology and Oncology

## 2016-08-12 VITALS — BP 115/77 | HR 60 | Temp 98.2°F | Resp 18

## 2016-08-12 DIAGNOSIS — M7989 Other specified soft tissue disorders: Secondary | ICD-10-CM | POA: Insufficient documentation

## 2016-08-12 DIAGNOSIS — L509 Urticaria, unspecified: Secondary | ICD-10-CM | POA: Insufficient documentation

## 2016-08-12 DIAGNOSIS — Z5112 Encounter for antineoplastic immunotherapy: Secondary | ICD-10-CM | POA: Insufficient documentation

## 2016-08-12 DIAGNOSIS — D509 Iron deficiency anemia, unspecified: Secondary | ICD-10-CM | POA: Insufficient documentation

## 2016-08-12 DIAGNOSIS — R188 Other ascites: Secondary | ICD-10-CM | POA: Insufficient documentation

## 2016-08-12 DIAGNOSIS — C801 Malignant (primary) neoplasm, unspecified: Secondary | ICD-10-CM

## 2016-08-12 DIAGNOSIS — Z5111 Encounter for antineoplastic chemotherapy: Secondary | ICD-10-CM | POA: Insufficient documentation

## 2016-08-12 DIAGNOSIS — Z7689 Persons encountering health services in other specified circumstances: Secondary | ICD-10-CM | POA: Insufficient documentation

## 2016-08-12 DIAGNOSIS — Z1509 Genetic susceptibility to other malignant neoplasm: Secondary | ICD-10-CM | POA: Insufficient documentation

## 2016-08-12 DIAGNOSIS — Z8719 Personal history of other diseases of the digestive system: Secondary | ICD-10-CM | POA: Insufficient documentation

## 2016-08-12 DIAGNOSIS — R109 Unspecified abdominal pain: Secondary | ICD-10-CM | POA: Insufficient documentation

## 2016-08-12 DIAGNOSIS — D709 Neutropenia, unspecified: Secondary | ICD-10-CM | POA: Insufficient documentation

## 2016-08-12 DIAGNOSIS — C162 Malignant neoplasm of body of stomach: Secondary | ICD-10-CM | POA: Insufficient documentation

## 2016-08-12 DIAGNOSIS — R079 Chest pain, unspecified: Secondary | ICD-10-CM | POA: Insufficient documentation

## 2016-08-12 DIAGNOSIS — K219 Gastro-esophageal reflux disease without esophagitis: Secondary | ICD-10-CM | POA: Insufficient documentation

## 2016-08-12 DIAGNOSIS — C786 Secondary malignant neoplasm of retroperitoneum and peritoneum: Secondary | ICD-10-CM | POA: Insufficient documentation

## 2016-08-12 DIAGNOSIS — R630 Anorexia: Secondary | ICD-10-CM | POA: Insufficient documentation

## 2016-08-12 LAB — COMPREHENSIVE METABOLIC PANEL
ALT: 20 U/L (ref 17–63)
AST: 31 U/L (ref 15–41)
Albumin: 3.7 g/dL (ref 3.5–5.0)
Alkaline Phosphatase: 72 U/L (ref 38–126)
Anion gap: 9 (ref 5–15)
BUN: 13 mg/dL (ref 6–20)
CO2: 25 mmol/L (ref 22–32)
Calcium: 8.5 mg/dL — ABNORMAL LOW (ref 8.9–10.3)
Chloride: 106 mmol/L (ref 101–111)
Creatinine, Ser: 0.69 mg/dL (ref 0.61–1.24)
GFR calc Af Amer: >60 mL/min (ref >60)
GFR calc non Af Amer: >60 mL/min (ref >60)
Glucose, Bld: 140 mg/dL — ABNORMAL HIGH (ref 65–99)
Potassium: 3.3 mmol/L — ABNORMAL LOW (ref 3.5–5.1)
Sodium: 140 mmol/L (ref 135–145)
Total Bilirubin: 0.5 mg/dL (ref 0.3–1.2)
Total Protein: 6.4 g/dL — ABNORMAL LOW (ref 6.5–8.1)

## 2016-08-12 LAB — CBC WITH DIFFERENTIAL/PLATELET
Basophils Absolute: 0 10*3/uL (ref 0–0.1)
Basophils Relative: 2 %
Eosinophils Absolute: 0 10*3/uL (ref 0–0.7)
Eosinophils Relative: 1 %
HCT: 36.1 % — ABNORMAL LOW (ref 40.0–52.0)
Hemoglobin: 12.4 g/dL — ABNORMAL LOW (ref 13.0–18.0)
Lymphocytes Relative: 44 %
Lymphs Abs: 0.9 10*3/uL — ABNORMAL LOW (ref 1.0–3.6)
MCH: 28.7 pg (ref 26.0–34.0)
MCHC: 34.4 g/dL (ref 32.0–36.0)
MCV: 83.6 fL (ref 80.0–100.0)
Monocytes Absolute: 0.4 10*3/uL (ref 0.2–1.0)
Monocytes Relative: 17 %
Neutro Abs: 0.8 10*3/uL — ABNORMAL LOW (ref 1.4–6.5)
Neutrophils Relative %: 36 %
Platelets: 144 10*3/uL — ABNORMAL LOW (ref 150–440)
RBC: 4.32 MIL/uL — ABNORMAL LOW (ref 4.40–5.90)
RDW: 17 % — ABNORMAL HIGH (ref 11.5–14.5)
WBC: 2.1 10*3/uL — ABNORMAL LOW (ref 3.8–10.6)

## 2016-08-12 LAB — MAGNESIUM: Magnesium: 1.9 mg/dL (ref 1.7–2.4)

## 2016-08-12 MED ORDER — TBO-FILGRASTIM 480 MCG/0.8ML ~~LOC~~ SOSY
480.0000 ug | PREFILLED_SYRINGE | Freq: Once | SUBCUTANEOUS | Status: AC
  Administered 2016-08-12: 480 ug via SUBCUTANEOUS

## 2016-08-12 MED ORDER — HEPARIN SOD (PORK) LOCK FLUSH 100 UNIT/ML IV SOLN
500.0000 [IU] | Freq: Once | INTRAVENOUS | Status: AC
  Administered 2016-08-12: 500 [IU] via INTRAVENOUS

## 2016-08-12 MED ORDER — SODIUM CHLORIDE 0.9% FLUSH
10.0000 mL | INTRAVENOUS | Status: DC | PRN
  Administered 2016-08-12: 10 mL via INTRAVENOUS
  Filled 2016-08-12: qty 10

## 2016-08-12 NOTE — Progress Notes (Signed)
ANC: 800. MD, Dr. Mike Gip, notfied via telephone. Per MD order: hold scheduled treatment today. MD to place further orders. See MAR.

## 2016-08-13 ENCOUNTER — Other Ambulatory Visit: Payer: Self-pay | Admitting: *Deleted

## 2016-08-13 DIAGNOSIS — C801 Malignant (primary) neoplasm, unspecified: Principal | ICD-10-CM

## 2016-08-13 DIAGNOSIS — C786 Secondary malignant neoplasm of retroperitoneum and peritoneum: Secondary | ICD-10-CM

## 2016-08-13 DIAGNOSIS — C162 Malignant neoplasm of body of stomach: Secondary | ICD-10-CM

## 2016-08-19 ENCOUNTER — Ambulatory Visit: Payer: Self-pay

## 2016-08-19 ENCOUNTER — Other Ambulatory Visit: Payer: Self-pay

## 2016-08-19 ENCOUNTER — Inpatient Hospital Stay (HOSPITAL_BASED_OUTPATIENT_CLINIC_OR_DEPARTMENT_OTHER): Payer: Self-pay | Admitting: Hematology and Oncology

## 2016-08-19 ENCOUNTER — Ambulatory Visit: Payer: Self-pay | Admitting: Hematology and Oncology

## 2016-08-19 ENCOUNTER — Inpatient Hospital Stay: Payer: Self-pay

## 2016-08-19 ENCOUNTER — Encounter: Payer: Self-pay | Admitting: Hematology and Oncology

## 2016-08-19 VITALS — BP 121/83 | HR 63 | Temp 98.4°F | Resp 18 | Wt 185.2 lb

## 2016-08-19 DIAGNOSIS — C786 Secondary malignant neoplasm of retroperitoneum and peritoneum: Secondary | ICD-10-CM

## 2016-08-19 DIAGNOSIS — Z1509 Genetic susceptibility to other malignant neoplasm: Secondary | ICD-10-CM

## 2016-08-19 DIAGNOSIS — D509 Iron deficiency anemia, unspecified: Secondary | ICD-10-CM

## 2016-08-19 DIAGNOSIS — L509 Urticaria, unspecified: Secondary | ICD-10-CM

## 2016-08-19 DIAGNOSIS — R109 Unspecified abdominal pain: Secondary | ICD-10-CM

## 2016-08-19 DIAGNOSIS — M7989 Other specified soft tissue disorders: Secondary | ICD-10-CM

## 2016-08-19 DIAGNOSIS — Z7689 Persons encountering health services in other specified circumstances: Secondary | ICD-10-CM

## 2016-08-19 DIAGNOSIS — C801 Malignant (primary) neoplasm, unspecified: Principal | ICD-10-CM

## 2016-08-19 DIAGNOSIS — C162 Malignant neoplasm of body of stomach: Secondary | ICD-10-CM

## 2016-08-19 DIAGNOSIS — Z5111 Encounter for antineoplastic chemotherapy: Secondary | ICD-10-CM

## 2016-08-19 DIAGNOSIS — Z8719 Personal history of other diseases of the digestive system: Secondary | ICD-10-CM

## 2016-08-19 DIAGNOSIS — K219 Gastro-esophageal reflux disease without esophagitis: Secondary | ICD-10-CM

## 2016-08-19 LAB — COMPREHENSIVE METABOLIC PANEL
ALT: 21 U/L (ref 17–63)
AST: 36 U/L (ref 15–41)
Albumin: 3.9 g/dL (ref 3.5–5.0)
Alkaline Phosphatase: 100 U/L (ref 38–126)
Anion gap: 5 (ref 5–15)
BUN: 11 mg/dL (ref 6–20)
CO2: 26 mmol/L (ref 22–32)
Calcium: 8.6 mg/dL — ABNORMAL LOW (ref 8.9–10.3)
Chloride: 107 mmol/L (ref 101–111)
Creatinine, Ser: 0.74 mg/dL (ref 0.61–1.24)
GFR calc Af Amer: >60 mL/min (ref >60)
GFR calc non Af Amer: >60 mL/min (ref >60)
Glucose, Bld: 116 mg/dL — ABNORMAL HIGH (ref 65–99)
Potassium: 3.6 mmol/L (ref 3.5–5.1)
Sodium: 138 mmol/L (ref 135–145)
Total Bilirubin: 0.7 mg/dL (ref 0.3–1.2)
Total Protein: 7.1 g/dL (ref 6.5–8.1)

## 2016-08-19 LAB — CBC WITH DIFFERENTIAL/PLATELET
Basophils Absolute: 0 10*3/uL (ref 0–0.1)
Basophils Relative: 1 %
Eosinophils Absolute: 0.1 10*3/uL (ref 0–0.7)
Eosinophils Relative: 1 %
HCT: 41.5 % (ref 40.0–52.0)
Hemoglobin: 14.3 g/dL (ref 13.0–18.0)
Lymphocytes Relative: 26 %
Lymphs Abs: 1.4 10*3/uL (ref 1.0–3.6)
MCH: 28.9 pg (ref 26.0–34.0)
MCHC: 34.4 g/dL (ref 32.0–36.0)
MCV: 84.1 fL (ref 80.0–100.0)
Monocytes Absolute: 0.5 10*3/uL (ref 0.2–1.0)
Monocytes Relative: 9 %
Neutro Abs: 3.3 10*3/uL (ref 1.4–6.5)
Neutrophils Relative %: 63 %
Platelets: 126 10*3/uL — ABNORMAL LOW (ref 150–440)
RBC: 4.93 MIL/uL (ref 4.40–5.90)
RDW: 18 % — ABNORMAL HIGH (ref 11.5–14.5)
WBC: 5.2 10*3/uL (ref 3.8–10.6)

## 2016-08-19 LAB — MAGNESIUM: Magnesium: 2.1 mg/dL (ref 1.7–2.4)

## 2016-08-19 NOTE — Progress Notes (Signed)
Patient states he sometimes feels full and has sharp pains in his stomach.  Sometimes does not want anything to eat.  Otherwise, no complaints.

## 2016-08-19 NOTE — Progress Notes (Signed)
[facility] day:  08/19/16  Chief Complaint: [place] Mel[place] is a 36 y.o. male with metastatic gastric cancer who is seen for assessment prior to day 1 of cycle #5 ramucirumab (Cyramza) + Taxol.  HPI:  The patient was last seen in the medical oncology clinic on 07/22/2016.  At that time, he felt good.  He described intermittent bloating.  He began cycle #4 chemotherapy.  He received treatment on 07/22/2016 and 07/29/2016.  He was in [place] from 08/02/2016 - 08/09/2016.  Counts on 08/12/2016 revealed an [place] of 800.  He did not received chemotherapy.  He received GCSF.  During the interim, he notes intermittent sharp pains in his stomach.  He denies any nausea, vomiting or symptoms of reflux.  He also has had intermittent swollen feet.     Past Medical History:  Diagnosis Date  . Gastric cancer ([place]) 08/2015   Folfox chemo tx's.  [place] GERD (gastroesophageal reflux disease)     Past Surgical History:  Procedure Laterality Date  . ESOPHAGOGASTRODUODENOSCOPY (EGD) WITH PROPOFOL N/A 08/02/2015   Procedure: ESOPHAGOGASTRODUODENOSCOPY (EGD) WITH PROPOFOL;  Surgeon: Lucilla Lame, MD;  Location: [place];  Service: [facility];  Laterality: N/A;  . ESOPHAGOGASTRODUODENOSCOPY (EGD) WITH PROPOFOL N/A 02/26/2016   Procedure: ESOPHAGOGASTRODUODENOSCOPY (EGD) WITH PROPOFOL;  Surgeon: Lucilla Lame, MD;  Location: [place];  Service: Melendez;  Laterality: N/A;  . NO PAST SURGERIES    . PERIPHERAL VASCULAR CATHETERIZATION N/A 08/13/2015   Procedure: Glori Luis Cath Insertion;  Surgeon: Algernon Huxley, MD;  Location: [place];  Service: Cardiovascular;  Laterality: N/A;    History reviewed. No pertinent family history.  Social History:  reports that he has never smoked. He has never used smokeless tobacco. He reports that he does not drink alcohol or use drugs.  The patient is from [place].  He speaks [place].  He moves to the [place] in  2000.  He lives in [place] with his mother, father, brother and sister.  He is a Theme park manager.  The patient is accompanied by his sister and the interpreter today.   Allergies:  Allergies  Allergen Reactions  . Iodine     Possible anaphylaxis per MD     Current Medications: Current Outpatient Prescriptions  Medication Sig Dispense Refill  . ferrous sulfate 325 (65 FE) MG EC tablet Take 1 tablet (325 mg total) by mouth daily with breakfast. 30 tablet 3  . HYDROcodone-acetaminophen (NORCO) 7.5-325 MG tablet Take 1 tablet by mouth every 4 (four) hours as needed for moderate pain. 90 tablet 0  . hydrOXYzine (ATARAX/VISTARIL) 25 MG tablet Take 25 mg by mouth daily.    [place] lactulose (CHRONULAC) 10 GM/15ML solution Take 30 mLs (20 g total) by mouth daily as needed for mild constipation. 120 mL 0  . levocetirizine (XYZAL) 5 MG tablet Take 5 mg by mouth every evening.    [place] omeprazole (PRILOSEC) 20 MG capsule Take 1 capsule (20 mg total) by mouth 2 (two) times daily before a meal. 60 capsule 2  . ondansetron (ZOFRAN) 8 MG tablet Take 1 tablet (8 mg total) by mouth 2 (two) times daily as needed (Nausea or vomiting). 30 tablet 1  . potassium chloride (K-DUR) 10 MEQ tablet Take 1 tablet (10 mEq total) by mouth 2 (two) times daily. Take 1 tablet daily 60 tablet 1  . prochlorperazine (COMPAZINE) 10 MG tablet Take 1 tablet (10 mg total) by mouth every 6 (six) hours as needed for  nausea or vomiting. 30 tablet 1  . Diphenhyd-Hydrocort-Nystatin (FIRST-DUKES MOUTHWASH) SUSP Use as directed 10 mLs in the mouth or throat 4 (four) times daily as needed. (Patient not taking: Reported on 06/12/2016) 480 mL 3  . gabapentin (NEURONTIN) 100 MG capsule Take 1 capsule (100 mg total) by mouth 3 (three) times daily. (Patient not taking: Reported on 06/12/2016) 90 capsule 1   No current facility-administered medications for this visit.    Facility-Administered Medications Ordered in Other Visits  Medication Dose Route Frequency  Provider Last Rate Last Dose  . heparin lock flush 100 unit/mL  500 Units Intravenous Once Corcoran, Melissa C, MD      . pegfilgrastim (NEULASTA) injection 6 mg  6 mg Subcutaneous Once Corcoran, Melissa C, MD      . sodium chloride 0.9 % injection 10 mL  10 mL Intravenous Once Corcoran, Melissa C, MD      . sodium chloride flush (NS) 0.9 % injection 10 mL  10 mL Intravenous PRN Lequita Asal, MD   10 mL at 08/28/15 2992    Review of Systems:  GENERAL:  Feels "good".  No fevers or sweats.  Weight down 2 pounds. PERFORMANCE STATUS (ECOG):  1 HEENT:  No visual changes, runny nose, sore throat, mouth sores or tenderness. Lungs:  No shortness of breath or cough.  No hemoptysis. Cardiac:  No chest pain, palpitations, orthopnea, or PND. GI:    Eating well.  Intermittent sharp abdominal pain.  No nausea, vomiting, diarrhea, constipation, melena or hematochezia. GU:  No urgency, frequency, dysuria, or hematuria. Musculoskeletal:  No bone pain.  No muscle tenderness.  Takes Claritin to prevent Neulasta induced bone pain. Extremities:  No pain or swelling. Skin:  Urticaria, resolved.  No rashes or ulcers. Neuro:  No neuropathy.  No headache, numbness or weakness, balance or coordination issues. Endocrine:  No diabetes, thyroid issues, hot flashes or night sweats. Psych:  No mood changes, depression or anxiety. Pain: No pain. Review of systems:  All other systems reviewed and found to be negative.  Physical Exam: Blood pressure 121/83, pulse 63, temperature 98.4 F (36.9 C), temperature source Tympanic, resp. rate 18, weight 185 lb 4 oz (84 kg). GENERAL:  Well developed, well nourished, gentleman sitting comfortably in the exam room in no acute distress. MENTAL STATUS:  Alert and oriented to person, place and time. HEAD:  Wearing a cap. Thin mustache.  Normocephalic, atraumatic, face symmetric, no Cushingoid features. EYES:  Brown eyes.  Pupils equal rounfd and reactive to light and  accomodation.  No conjunctivitis or scleral icterus. ENT:  Oropharynx clear without lesion.  Tongue normal. Mucous membranes moist.  RESPIRATORY:  Clear to auscultation without rales, wheezes or rhonchi. CARDIOVASCULAR:  Regular rate and rhythm without murmur, rub or gallop. ABDOMEN:  Soft, non-tender with active bowel sounds and no hepatosplenomegaly.  No masses. SKIN:  No urticaria.  No rashes or ulcers. EXTREMITIES: No edema, no skin discoloration or tenderness.  No palpable cords. LYMPH NODES: No palpable cervical, supraclavicular, axillary or inguinal adenopathy  NEUROLOGICAL: Unremarkable. PSYCH:  Appropriate.   Imaging studies: 07/29/2015:  Chest, abdomen and pelvic CT revealed a 4 cm thickened and irregular gastric wall compatible with an infiltrative malignancy. There was extensive involvement of the upper mesentery and omentum compatible with peritoneal carcinomatosis. There was a small amount of ascites.    08/08/2015:  Head CT revealed no evidence of metastatic disease. 10/05/2015:  Abdomen and pelvic CT revealed a positive response to therapy with decreased  size of the primary mass along the lesser curvature of the stomach, decrease extension of the mass into the gastrohepatic ligament, decreasing evidence of peritoneal carcinomatosis involving predominantly the omentum, and resolution of previously noted malignant ascites. There was a potential ulcer along the greater curvature of the mid body of the stomach.   12/24/2015:  Abdomen and pelvic CT revealed further improvement in primary gastric malignancy and adjacent lymphadenopathy. There was no residual peritoneal carcinomatosis identified. 03/21/2016:  Abdominal CT revealed new small volume ascites within the abdomen extending over the liver and spleen.  There was increase soft tissue stranding throughout the peritoneal cavity worrisome for recurrence of peritoneal carcinomatosis. 04/24/2016:  Abdomen and pelvic CT revealed  progressive intraperitoneal metastatic disease with increased ascites and omental caking.   Appointment on 08/19/2016  Component Date Value Ref Range Status  . WBC 08/19/2016 5.2  3.8 - 10.6 K/uL Final  . RBC 08/19/2016 4.93  4.40 - 5.90 MIL/uL Final  . Hemoglobin 08/19/2016 14.3  13.0 - 18.0 g/dL Final  . HCT 07/71/9222 41.5  40.0 - 52.0 % Final  . MCV 08/19/2016 84.1  80.0 - 100.0 fL Final  . MCH 08/19/2016 28.9  26.0 - 34.0 pg Final  . MCHC 08/19/2016 34.4  32.0 - 36.0 g/dL Final  . RDW 29/98/6037 18.0* 11.5 - 14.5 % Final  . Platelets 08/19/2016 126* 150 - 440 K/uL Final  . Neutrophils Relative % 08/19/2016 63  % Final  . Neutro Abs 08/19/2016 3.3  1.4 - 6.5 K/uL Final  . Lymphocytes Relative 08/19/2016 26  % Final  . Lymphs Abs 08/19/2016 1.4  1.0 - 3.6 K/uL Final  . Monocytes Relative 08/19/2016 9  % Final  . Monocytes Absolute 08/19/2016 0.5  0.2 - 1.0 K/uL Final  . Eosinophils Relative 08/19/2016 1  % Final  . Eosinophils Absolute 08/19/2016 0.1  0 - 0.7 K/uL Final  . Basophils Relative 08/19/2016 1  % Final  . Basophils Absolute 08/19/2016 0.0  0 - 0.1 K/uL Final  . Sodium 08/19/2016 138  135 - 145 mmol/L Final  . Potassium 08/19/2016 3.6  3.5 - 5.1 mmol/L Final  . Chloride 08/19/2016 107  101 - 111 mmol/L Final  . CO2 08/19/2016 26  22 - 32 mmol/L Final  . Glucose, Bld 08/19/2016 116* 65 - 99 mg/dL Final  . BUN 20/06/2598 11  6 - 20 mg/dL Final  . Creatinine, Ser 08/19/2016 0.74  0.61 - 1.24 mg/dL Final  . Calcium 00/86/3546 8.6* 8.9 - 10.3 mg/dL Final  . Total Protein 08/19/2016 7.1  6.5 - 8.1 g/dL Final  . Albumin 06/95/7616 3.9  3.5 - 5.0 g/dL Final  . AST 27/63/6616 36  15 - 41 U/L Final  . ALT 08/19/2016 21  17 - 63 U/L Final  . Alkaline Phosphatase 08/19/2016 100  38 - 126 U/L Final  . Total Bilirubin 08/19/2016 0.7  0.3 - 1.2 mg/dL Final  . GFR calc non Af Amer 08/19/2016 >60  >60 mL/min Final  . GFR calc Af Amer 08/19/2016 >60  >60 mL/min Final   Comment:  (NOTE) The eGFR has been calculated using the CKD EPI equation. This calculation has not been validated in all clinical situations. eGFR's persistently <60 mL/min signify possible Chronic Kidney Disease.   . Anion gap 08/19/2016 5  5 - 15 Final  . Magnesium 08/19/2016 2.1  1.7 - 2.4 mg/dL Final    Assessment:  [place] is a 36 y.o. male with metastatic gastric  cancer.  He presented with a 4-6 week history of progressive epigastric pain.  EGD on 08/02/2015 revealed a large, ulcerated, non-circumferential mass with oozing and stigmata of recent bleeding in the entire examined stomach.  The mass involved the fundus down to the antrum.  Biopsies revealed poorly differentiated adenocarcinoma.  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  Her2/neu testing was negative.  Microsatellite testing was negative.  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  PDL-1 was expressed.  CEA was 0.3 on 07/30/2015.  Chest, abdomen and pelvic CT scan on 07/29/2015 revealed a 4 cm thickened and irregular gastric wall compatible with an infiltrative malignancy. There was extensive involvement of the upper mesentery and omentum compatible with peritoneal carcinomatosis. There was a small amount of ascites.     He completed treatment for H pylori  (began 08/09/2015).  Stool was negative for H pylori on 11/01/2015.  He has iron deficiency anemia likely gastric oozing and modest diet.  RBC are microcytic.  Labs on 08/21/2015 revealed a ferritin was 44 with an iron saturation 4% and a TIBC of 395.  Ferritin was 116 on 09/18/2015.  He received 12 cycles of FOLFOX chemotherapy (08/14/2015 - 02/05/2016).  Chemotherapy was held on 10/23/2015 secondary to thrombocytopenia (87,000).  Platelet count was 90,000 prior to cycle #11.  He has tolerated his  chemotherapy well without diarrhea, mouth sores, or neuropathy.  He received 3 cycles of maintenance 5FU/LV (03/03/2016 - 04/08/2016).  He is s/p 4 cycles of ramucirumab + Taxol (05/01/2016 - 07/22/2016).  He notes slight transient tingling in his fingertips.  Chemotherapy was held on day 15 of cycle #3 secondary to neutropenia (ANC 600).  He receives Granix if WBC < 3,000 or ANC < 1500.  EGD on 02/26/2016 revealed one non-bleeding gastric ulcer.   The mass appeared smaller.  Biopsy revealed moderate chronic gastritis and edema, negative for H pylori, dysplasia and malignancy.  Abdomen and pelvic CT on 04/24/2016 revealed progressive intraperitoneal metastatic disease with increased ascites and omental caking.  Abdominal and pelvic CT on 06/17/2016 revealed decreased omental metastatic disease.  The volume of ascites had decreased.  He has a history of urticaria of unclear etiology.  He is followed by dermatology.  He takes Zyrtec every morning and hydroxyzine at night.  He takes Xyzal 5 mg daily.  He was instructed to use mild soaps and not to use perfumes.   Symptomatically, he has intermittent sharp abdominal pain.  Exam is stable.  ANC is 3300.   Plan: 1.  Labs today:  CBC with diff, CMP, Mg. 2.  Discuss unclear etiology of intermittent abdominal pain.  Discuss plan for imaging prior to next cycle. 3.  Discuss low counts despite time off of therapy.  Discuss response to GCSF/Granix.  Discuss Granix if WBC < 3,000 or ANC < 1500. 4.  Abdomen and pelvic CT scan on 09/10/2016. 5.  Day 1 of cycle #5 Taxol and ramucirumab tomorrow. 6.  RTC on 07/25 for labs (CBC with diff, CMP) and day 8 of cycle #4 Taxol. 7.  RTC on 08/01 for labs (CBC with diff, CMP, Mg), and day 15 of cycle #4 Taxol and ramucirumab. 8.  RTC on 08/08 for labs (CBC with diff). 9.  RTC on 08/14 for MD assessment, labs (CBC with diff, CMP, Mg), review of scans, and day 1 of cycle #6 Taxol and ramucirumab.  Lequita Asal,  MD  08/19/2016, 10:25 AM

## 2016-08-20 ENCOUNTER — Other Ambulatory Visit: Payer: Self-pay | Admitting: *Deleted

## 2016-08-20 ENCOUNTER — Inpatient Hospital Stay: Payer: Self-pay

## 2016-08-20 VITALS — BP 130/82 | HR 66 | Temp 97.5°F | Resp 20

## 2016-08-20 DIAGNOSIS — C162 Malignant neoplasm of body of stomach: Secondary | ICD-10-CM

## 2016-08-20 DIAGNOSIS — C169 Malignant neoplasm of stomach, unspecified: Secondary | ICD-10-CM

## 2016-08-20 MED ORDER — SODIUM CHLORIDE 0.9% FLUSH
10.0000 mL | INTRAVENOUS | Status: DC | PRN
  Filled 2016-08-20: qty 10

## 2016-08-20 MED ORDER — ACETAMINOPHEN 325 MG PO TABS
650.0000 mg | ORAL_TABLET | Freq: Once | ORAL | Status: AC
  Administered 2016-08-20: 650 mg via ORAL
  Filled 2016-08-20: qty 2

## 2016-08-20 MED ORDER — HEPARIN SOD (PORK) LOCK FLUSH 100 UNIT/ML IV SOLN
500.0000 [IU] | Freq: Once | INTRAVENOUS | Status: AC
Start: 2016-08-20 — End: 2016-08-20
  Administered 2016-08-20: 500 [IU] via INTRAVENOUS

## 2016-08-20 MED ORDER — FAMOTIDINE IN NACL 20-0.9 MG/50ML-% IV SOLN
20.0000 mg | Freq: Once | INTRAVENOUS | Status: AC
  Administered 2016-08-20: 20 mg via INTRAVENOUS
  Filled 2016-08-20: qty 50

## 2016-08-20 MED ORDER — SODIUM CHLORIDE 0.9 % IV SOLN
8.0000 mg/kg | Freq: Once | INTRAVENOUS | Status: AC
  Administered 2016-08-20: 700 mg via INTRAVENOUS
  Filled 2016-08-20: qty 20

## 2016-08-20 MED ORDER — SODIUM CHLORIDE 0.9 % IV SOLN
Freq: Once | INTRAVENOUS | Status: AC
  Administered 2016-08-20: 10:00:00 via INTRAVENOUS
  Filled 2016-08-20: qty 1000

## 2016-08-20 MED ORDER — SODIUM CHLORIDE 0.9 % IV SOLN
80.0000 mg/m2 | Freq: Once | INTRAVENOUS | Status: AC
  Administered 2016-08-20: 162 mg via INTRAVENOUS
  Filled 2016-08-20: qty 27

## 2016-08-20 MED ORDER — SODIUM CHLORIDE 0.9 % IV SOLN: 10.0000 mg | Freq: Once | INTRAVENOUS | Status: DC

## 2016-08-20 MED ORDER — DEXAMETHASONE SODIUM PHOSPHATE 10 MG/ML IJ SOLN
10.0000 mg | Freq: Once | INTRAMUSCULAR | Status: AC
  Administered 2016-08-20: 10 mg via INTRAVENOUS
  Filled 2016-08-20: qty 1

## 2016-08-20 MED ORDER — DIPHENHYDRAMINE HCL 50 MG/ML IJ SOLN
50.0000 mg | Freq: Once | INTRAMUSCULAR | Status: AC
  Administered 2016-08-20: 50 mg via INTRAVENOUS
  Filled 2016-08-20: qty 1

## 2016-08-20 NOTE — Progress Notes (Signed)
Proceed with orders from Day 15, Cycle 4 today per conversation with Dr. Mike Gip.

## 2016-08-26 ENCOUNTER — Ambulatory Visit: Payer: Self-pay

## 2016-08-26 ENCOUNTER — Ambulatory Visit: Payer: Self-pay | Admitting: Hematology and Oncology

## 2016-08-26 ENCOUNTER — Other Ambulatory Visit: Payer: Self-pay

## 2016-08-27 ENCOUNTER — Inpatient Hospital Stay: Payer: Self-pay

## 2016-08-27 ENCOUNTER — Telehealth: Payer: Self-pay | Admitting: *Deleted

## 2016-08-27 ENCOUNTER — Other Ambulatory Visit: Payer: Self-pay | Admitting: Hematology and Oncology

## 2016-08-27 VITALS — BP 131/90 | HR 69 | Temp 98.0°F | Resp 18

## 2016-08-27 DIAGNOSIS — Z5111 Encounter for antineoplastic chemotherapy: Secondary | ICD-10-CM

## 2016-08-27 DIAGNOSIS — C786 Secondary malignant neoplasm of retroperitoneum and peritoneum: Secondary | ICD-10-CM

## 2016-08-27 DIAGNOSIS — C162 Malignant neoplasm of body of stomach: Secondary | ICD-10-CM

## 2016-08-27 DIAGNOSIS — C169 Malignant neoplasm of stomach, unspecified: Secondary | ICD-10-CM

## 2016-08-27 DIAGNOSIS — C801 Malignant (primary) neoplasm, unspecified: Secondary | ICD-10-CM

## 2016-08-27 LAB — CBC WITH DIFFERENTIAL/PLATELET
Basophils Absolute: 0.1 10*3/uL (ref 0–0.1)
Basophils Relative: 2 %
Eosinophils Absolute: 0.1 10*3/uL (ref 0–0.7)
Eosinophils Relative: 3 %
HCT: 38.2 % — ABNORMAL LOW (ref 40.0–52.0)
Hemoglobin: 13 g/dL (ref 13.0–18.0)
Lymphocytes Relative: 44 %
Lymphs Abs: 1.1 10*3/uL (ref 1.0–3.6)
MCH: 28.6 pg (ref 26.0–34.0)
MCHC: 34 g/dL (ref 32.0–36.0)
MCV: 84 fL (ref 80.0–100.0)
Monocytes Absolute: 0.1 10*3/uL — ABNORMAL LOW (ref 0.2–1.0)
Monocytes Relative: 4 %
Neutro Abs: 1.1 10*3/uL — ABNORMAL LOW (ref 1.4–6.5)
Neutrophils Relative %: 47 %
Platelets: 113 10*3/uL — ABNORMAL LOW (ref 150–440)
RBC: 4.55 MIL/uL (ref 4.40–5.90)
RDW: 17.5 % — ABNORMAL HIGH (ref 11.5–14.5)
WBC: 2.4 10*3/uL — ABNORMAL LOW (ref 3.8–10.6)

## 2016-08-27 LAB — COMPREHENSIVE METABOLIC PANEL
ALT: 30 U/L (ref 17–63)
AST: 46 U/L — ABNORMAL HIGH (ref 15–41)
Albumin: 3.8 g/dL (ref 3.5–5.0)
Alkaline Phosphatase: 94 U/L (ref 38–126)
Anion gap: 6 (ref 5–15)
BUN: 9 mg/dL (ref 6–20)
CO2: 25 mmol/L (ref 22–32)
Calcium: 8.4 mg/dL — ABNORMAL LOW (ref 8.9–10.3)
Chloride: 105 mmol/L (ref 101–111)
Creatinine, Ser: 0.62 mg/dL (ref 0.61–1.24)
GFR calc Af Amer: >60 mL/min (ref >60)
GFR calc non Af Amer: >60 mL/min (ref >60)
Glucose, Bld: 148 mg/dL — ABNORMAL HIGH (ref 65–99)
Potassium: 3.4 mmol/L — ABNORMAL LOW (ref 3.5–5.1)
Sodium: 136 mmol/L (ref 135–145)
Total Bilirubin: 0.8 mg/dL (ref 0.3–1.2)
Total Protein: 6.8 g/dL (ref 6.5–8.1)

## 2016-08-27 LAB — T4, FREE: Free T4: 1.12 ng/dL (ref 0.61–1.12)

## 2016-08-27 LAB — TSH: TSH: 6.08 u[IU]/mL — ABNORMAL HIGH (ref 0.350–4.500)

## 2016-08-27 LAB — PROTEIN, URINE, RANDOM: Total Protein, Urine: 46 mg/dL

## 2016-08-27 LAB — MAGNESIUM: Magnesium: 2 mg/dL (ref 1.7–2.4)

## 2016-08-27 MED ORDER — PACLITAXEL CHEMO INJECTION 300 MG/50ML
80.0000 mg/m2 | Freq: Once | INTRAVENOUS | Status: AC
  Administered 2016-08-27: 162 mg via INTRAVENOUS
  Filled 2016-08-27: qty 27

## 2016-08-27 MED ORDER — DEXAMETHASONE SODIUM PHOSPHATE 10 MG/ML IJ SOLN
10.0000 mg | Freq: Once | INTRAMUSCULAR | Status: AC
  Administered 2016-08-27: 10 mg via INTRAVENOUS
  Filled 2016-08-27: qty 1

## 2016-08-27 MED ORDER — SODIUM CHLORIDE 0.9 % IV SOLN: 10.0000 mg | Freq: Once | INTRAVENOUS | Status: DC

## 2016-08-27 MED ORDER — HEPARIN SOD (PORK) LOCK FLUSH 100 UNIT/ML IV SOLN
500.0000 [IU] | Freq: Once | INTRAVENOUS | Status: AC
  Administered 2016-08-27: 500 [IU] via INTRAVENOUS
  Filled 2016-08-27: qty 5

## 2016-08-27 MED ORDER — SODIUM CHLORIDE 0.9 % IV SOLN
Freq: Once | INTRAVENOUS | Status: AC
  Administered 2016-08-27: 10:00:00 via INTRAVENOUS
  Filled 2016-08-27: qty 1000

## 2016-08-27 MED ORDER — DIPHENHYDRAMINE HCL 50 MG/ML IJ SOLN
50.0000 mg | Freq: Once | INTRAMUSCULAR | Status: AC
  Administered 2016-08-27: 50 mg via INTRAVENOUS
  Filled 2016-08-27: qty 1

## 2016-08-27 MED ORDER — SODIUM CHLORIDE 0.9% FLUSH
10.0000 mL | INTRAVENOUS | Status: DC | PRN
  Administered 2016-08-27: 10 mL via INTRAVENOUS
  Filled 2016-08-27: qty 10

## 2016-08-27 MED ORDER — FAMOTIDINE IN NACL 20-0.9 MG/50ML-% IV SOLN
20.0000 mg | Freq: Once | INTRAVENOUS | Status: AC
  Administered 2016-08-27: 20 mg via INTRAVENOUS
  Filled 2016-08-27: qty 50

## 2016-08-27 NOTE — Telephone Encounter (Signed)
-----   Message from Lequita Asal, MD sent at 08/27/2016 10:08 AM EDT ----- Regarding: Please add free T4   ----- Message ----- From: Interface, Lab In [place] Sent: 08/27/2016   9:20 AM To: Lequita Asal, MD

## 2016-08-27 NOTE — Telephone Encounter (Signed)
Per MD add free T-4 to labs today.

## 2016-08-27 NOTE — Telephone Encounter (Signed)
Patient reports forgetting to take potassium some days, with the use of a hospital reported, patient instructed to take kcl bid for 3 days and then q day.voiced understanding. Patient denied need for refill at this time

## 2016-08-28 ENCOUNTER — Inpatient Hospital Stay: Payer: Self-pay

## 2016-08-28 LAB — T4: T4, Total: 10.1 ug/dL (ref 4.5–12.0)

## 2016-08-29 ENCOUNTER — Inpatient Hospital Stay: Payer: Self-pay

## 2016-08-29 DIAGNOSIS — C162 Malignant neoplasm of body of stomach: Secondary | ICD-10-CM

## 2016-08-29 MED ORDER — TBO-FILGRASTIM 480 MCG/0.8ML ~~LOC~~ SOSY
480.0000 ug | PREFILLED_SYRINGE | Freq: Once | SUBCUTANEOUS | Status: AC
  Administered 2016-08-29: 480 ug via SUBCUTANEOUS

## 2016-08-30 ENCOUNTER — Emergency Department
Admission: EM | Admit: 2016-08-30 | Discharge: 2016-08-30 | Disposition: A | Discharge status: Home / Self Care | Payer: Self-pay | Attending: Emergency Medicine | Admitting: Emergency Medicine

## 2016-08-30 ENCOUNTER — Emergency Department: Payer: Self-pay

## 2016-08-30 DIAGNOSIS — C482 Malignant neoplasm of peritoneum, unspecified: Secondary | ICD-10-CM | POA: Insufficient documentation

## 2016-08-30 DIAGNOSIS — R109 Unspecified abdominal pain: Secondary | ICD-10-CM

## 2016-08-30 DIAGNOSIS — Z79899 Other long term (current) drug therapy: Secondary | ICD-10-CM | POA: Insufficient documentation

## 2016-08-30 LAB — LIPASE, BLOOD: LIPASE: 21 U/L (ref 11–51)

## 2016-08-30 LAB — COMPREHENSIVE METABOLIC PANEL
ALT: 35 U/L (ref 17–63)
AST: 37 U/L (ref 15–41)
Albumin: 3.7 g/dL (ref 3.5–5.0)
Alkaline Phosphatase: 81 U/L (ref 38–126)
Anion gap: 7 (ref 5–15)
BILIRUBIN TOTAL: 1.3 mg/dL — AB (ref 0.3–1.2)
BUN: 13 mg/dL (ref 6–20)
CHLORIDE: 107 mmol/L (ref 101–111)
CO2: 26 mmol/L (ref 22–32)
CREATININE: 0.66 mg/dL (ref 0.61–1.24)
Calcium: 8.6 mg/dL — ABNORMAL LOW (ref 8.9–10.3)
Glucose, Bld: 98 mg/dL (ref 65–99)
POTASSIUM: 3.6 mmol/L (ref 3.5–5.1)
Sodium: 140 mmol/L (ref 135–145)
TOTAL PROTEIN: 6.6 g/dL (ref 6.5–8.1)

## 2016-08-30 LAB — CBC
HCT: 38.7 % — ABNORMAL LOW (ref 40.0–52.0)
Hemoglobin: 12.8 g/dL — ABNORMAL LOW (ref 13.0–18.0)
MCH: 27.9 pg (ref 26.0–34.0)
MCHC: 33 g/dL (ref 32.0–36.0)
MCV: 84.7 fL (ref 80.0–100.0)
PLATELETS: 97 10*3/uL — AB (ref 150–440)
RBC: 4.57 MIL/uL (ref 4.40–5.90)
RDW: 17.5 % — ABNORMAL HIGH (ref 11.5–14.5)
WBC: 5.9 10*3/uL (ref 3.8–10.6)

## 2016-08-30 MED ORDER — IOPAMIDOL (ISOVUE-300) INJECTION 61%
100.0000 mL | Freq: Once | INTRAVENOUS | Status: AC | PRN
  Administered 2016-08-30: 100 mL via INTRAVENOUS

## 2016-08-30 MED ORDER — IOPAMIDOL (ISOVUE-300) INJECTION 61%
30.0000 mL | Freq: Once | INTRAVENOUS | Status: AC | PRN
  Administered 2016-08-30: 30 mL via ORAL

## 2016-08-30 MED ORDER — GI COCKTAIL ~~LOC~~
30.0000 mL | Freq: Once | ORAL | Status: AC
  Administered 2016-08-30: 30 mL via ORAL
  Filled 2016-08-30: qty 30

## 2016-08-30 MED ORDER — OXYCODONE-ACETAMINOPHEN 5-325 MG PO TABS
ORAL_TABLET | ORAL | Status: AC
  Administered 2016-08-30: 2 via ORAL
  Filled 2016-08-30: qty 2

## 2016-08-30 MED ORDER — OXYCODONE-ACETAMINOPHEN 5-325 MG PO TABS
2.0000 | ORAL_TABLET | Freq: Once | ORAL | Status: AC
  Administered 2016-08-30: 2 via ORAL

## 2016-08-30 MED ORDER — OXYCODONE-ACETAMINOPHEN 5-325 MG PO TABS: 1.0000 | ORAL_TABLET | Freq: Four times a day (QID) | ORAL | 0 refills | Status: DC | PRN

## 2016-08-30 NOTE — ED Provider Notes (Signed)
[facility] Emergency Department Provider Note  Time seen: 3:27 PM  I have reviewed the triage vital signs and the nursing notes.   HISTORY  Chief Complaint Abdominal Pain    HPI [place] is a 36 y.o. male with a past medical history of gastric cancer on chemotherapy, gastric reflux, who presents to the emergency department for upper abdominal pain especially left upper quadrant. According to the patient for the past 2 days she has been experiencing epigastric left upper quadrant pain. States nausea with one episode of vomiting this morning. Denies diarrhea. States mild dysuria which she describes as a burning. Denies penile discharge. Patient states very frequent reflux/burning. Patient is on chemotherapy for gastric cancer.  Past Medical History:  Diagnosis Date  . Gastric cancer ([place]) 08/2015   Folfox chemo tx's.  Marland Kitchen GERD (gastroesophageal reflux disease)     Patient Active Problem List   Diagnosis Date Noted  . Chemotherapy-induced neutropenia ([place]) 07/21/2016  . Goals of care, counseling/discussion 05/29/2016  . Urticaria 04/22/2016  . 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 Chronic peptic ulcer of stomach   . Encounter for antineoplastic chemotherapy 01/15/2016  . 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 Chronic gastric ulcer 10/28/2015  . Hypocalcemia 09/30/2015  . Iron deficiency anemia 08/28/2015  . Microcytic red blood cells 08/14/2015  . Hypokalemia 08/14/2015  . Malignant neoplasm of body of stomach ([place]) 08/07/2015  . Helicobacter pylori gastritis 08/02/2015  . Abnormal findings-gastrointestinal tract   . Neoplasm of digestive system   . Gastric wall thickening 08/01/2015  . 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 ESOPHAGOGASTRODUODENOSCOPY (EGD) WITH PROPOFOL N/A 08/02/2015   Procedure: ESOPHAGOGASTRODUODENOSCOPY (EGD) WITH PROPOFOL;  Surgeon: Lucilla Lame, MD;  Location: [facility];  Service: Endoscopy;  Laterality: N/A;  . ESOPHAGOGASTRODUODENOSCOPY (EGD) WITH PROPOFOL N/A 02/26/2016   Procedure: ESOPHAGOGASTRODUODENOSCOPY (EGD) WITH PROPOFOL;  Surgeon: Lucilla Lame, MD;  Location: [facility];  Service: Endoscopy;  Laterality: N/A;  . NO PAST SURGERIES    . PERIPHERAL VASCULAR CATHETERIZATION N/A 08/13/2015   Procedure: Glori Luis Cath Insertion;  Surgeon: Algernon Huxley, MD;  Location: [facility];  Service: Cardiovascular;  Laterality: N/A;    Prior to Admission medications   Medication Sig Start Date End Date Taking? Authorizing Provider  HYDROcodone-acetaminophen (NORCO) 7.5-325 MG tablet Take 1 tablet by mouth every 4 (four) hours as needed for moderate pain. 07/08/16  Yes Corcoran, Drue Second, MD  levocetirizine (XYZAL) 5 MG tablet Take 5 mg by mouth every evening.   Yes [provider]  omeprazole (PRILOSEC) 20 MG capsule Take 1 capsule (20 mg total) by mouth 2 (two) times daily before a meal. 03/26/16  Yes Corcoran, Melissa C, MD  ondansetron (ZOFRAN) 8 MG tablet Take 1 tablet (8 mg total) by mouth 2 (two) times daily as needed (Nausea or vomiting). 05/01/16  Yes Corcoran, Drue Second, MD  potassium chloride (K-DUR) 10 MEQ tablet Take 1 tablet (10 mEq total) by mouth 2 (two) times daily. Take 1 tablet daily 07/17/16  Yes Corcoran, Drue Second, MD  prochlorperazine (COMPAZINE) 10 MG tablet Take 1 tablet (10 mg total) by mouth every 6 (six) hours as needed for nausea or vomiting. 02/05/16  Yes Corcoran, Drue Second, MD  Diphenhyd-Hydrocort-Nystatin (FIRST-DUKES MOUTHWASH) SUSP Use as directed 10 mLs in the mouth or throat 4 (four) times daily as needed. Patient not taking: Reported on 06/12/2016 05/26/16   Lequita Asal, MD  ferrous sulfate 325 (65 FE) MG EC tablet Take 1 tablet (325 mg total) by mouth daily with breakfast. Patient not taking: Reported on 08/30/2016 08/28/15   Lequita Asal, MD  gabapentin (NEURONTIN) 100  MG capsule Take 1 capsule (100 mg total) by mouth 3 (three) times daily. Patient not taking: Reported on 06/12/2016 05/15/16 05/15/17  Sindy Guadeloupe, MD  hydrOXYzine (ATARAX/VISTARIL) 25 MG tablet Take 25 mg by mouth daily.    [provider]  lactulose (CHRONULAC) 10 GM/15ML solution Take 30 mLs (20 g total) by mouth daily as needed for mild constipation. Patient not taking: Reported on 08/27/2016 06/17/16   Paulette Blanch, MD    Allergies  Allergen Reactions  . Iodine     Possible anaphylaxis per MD     No family history on file.  Social History Social History  Substance Use Topics  . Smoking status: Never Smoker  . Smokeless tobacco: Never Used  . Alcohol use No    Review of Systems Constitutional: Negative for fever. Cardiovascular: Negative for chest pain. Respiratory: Negative for shortness of breath. Gastrointestinal: Left upper quadrant abdominal pain. Positive for nausea one episode of vomiting. Negative diarrhea. Genitourinary: Negative for dysuria. Musculoskeletal: Negative for back pain Neurological: Negative for headache All other ROS negative  ____________________________________________   PHYSICAL EXAM:  VITAL SIGNS: ED Triage Vitals  Enc Vitals Group     BP 08/30/16 1139 117/81     Pulse Rate 08/30/16 1139 85     Resp 08/30/16 1139 20     Temp 08/30/16 1139 98.9 F (37.2 C)     Temp Source 08/30/16 1139 Oral     SpO2 08/30/16 1139 100 %     Weight 08/30/16 1140 185 lb (83.9 kg)     Height 08/30/16 1140 '5\' 9"'$  (1.753 m)     Head Circumference --      Peak Flow --      Pain Score 08/30/16 1143 6     Pain Loc --      Pain Edu? --      Excl. in [place]? --     Constitutional: Alert and oriented. Well appearing and in no distress. Eyes: Normal exam ENT   Head: Normocephalic and atraumatic.   Mouth/Throat: Mucous membranes are moist. Cardiovascular: Normal rate, regular rhythm. No murmur Respiratory: Normal respiratory effort without  tachypnea nor retractions. Breath sounds are clear  Gastrointestinal: Soft, moderate left upper quadrant tenderness with mild epigastric tenderness. No rebound or guarding. Mild suprapubic tenderness. Musculoskeletal: Nontender with normal range of motion in all extremities.  Neurologic:  Normal speech and language. No gross focal neurologic deficits Skin:  Skin is warm, dry and intact.  Psychiatric: Mood and affect are normal.   ____________________________________________    RADIOLOGY  IMPRESSION: Increased amount of ascites compared to prior exam, as well as multiple small omental densities compared to prior exam suggesting peritoneal carcinomatosis.  ____________________________________________   INITIAL IMPRESSION / ASSESSMENT AND PLAN / ED COURSE  Pertinent labs & imaging results that were available during my care of the patient were reviewed by me and considered in my medical decision making (see chart for details).  Patient presents to the emergency department with left upper quadrant pain since yesterday with nausea, vomiting 1. Patient's labs are largely within normal limits, urinalysis pending. We will treat with a GI cocktail given his history of gastric cancer obtain a CT scan in the emergency department. Patient agreeable.  Patient CT scan does show increased  studies as well as peritoneal carcinomatosis. This is likely the cause of the patient's pain. We will discharge with Percocet for pain control. I sent a note to the patient's oncologist regarding today's CT findings. Patient will call Monday morning to arrange an appointment. ____________________________________________   FINAL CLINICAL IMPRESSION(S) / ED DIAGNOSES  Upper abdominal pain    Harvest Dark, MD 08/30/16 [phone]

## 2016-08-30 NOTE — ED Notes (Signed)
Patient stable at time of dc

## 2016-08-30 NOTE — Discharge Instructions (Signed)
Please follow-up with your oncologist by calling Monday morning to arrange a follow-up appointment as soon as possible. Please take your pain medication as needed, as prescribed. Return to the emergency department for any significant increase in pain or any other symptom personally concerning to yourself.

## 2016-08-30 NOTE — ED Triage Notes (Signed)
Pt arrives to ER via POV c/o lower abdominal pain that started yesterday. Sharp pain. Trouble urinating.

## 2016-09-01 ENCOUNTER — Ambulatory Visit: Payer: Self-pay

## 2016-09-02 ENCOUNTER — Encounter: Payer: Self-pay | Admitting: Hematology and Oncology

## 2016-09-02 ENCOUNTER — Inpatient Hospital Stay (HOSPITAL_BASED_OUTPATIENT_CLINIC_OR_DEPARTMENT_OTHER): Payer: Self-pay | Admitting: Hematology and Oncology

## 2016-09-02 VITALS — BP 132/87 | HR 96 | Temp 98.3°F | Resp 18 | Wt 180.4 lb

## 2016-09-02 DIAGNOSIS — Z7189 Other specified counseling: Secondary | ICD-10-CM

## 2016-09-02 DIAGNOSIS — R109 Unspecified abdominal pain: Secondary | ICD-10-CM

## 2016-09-02 DIAGNOSIS — R630 Anorexia: Secondary | ICD-10-CM

## 2016-09-02 DIAGNOSIS — R079 Chest pain, unspecified: Secondary | ICD-10-CM

## 2016-09-02 DIAGNOSIS — R180 Malignant ascites: Secondary | ICD-10-CM

## 2016-09-02 DIAGNOSIS — R188 Other ascites: Secondary | ICD-10-CM

## 2016-09-02 DIAGNOSIS — L509 Urticaria, unspecified: Secondary | ICD-10-CM

## 2016-09-02 DIAGNOSIS — D509 Iron deficiency anemia, unspecified: Secondary | ICD-10-CM

## 2016-09-02 DIAGNOSIS — K219 Gastro-esophageal reflux disease without esophagitis: Secondary | ICD-10-CM

## 2016-09-02 DIAGNOSIS — Z1509 Genetic susceptibility to other malignant neoplasm: Secondary | ICD-10-CM

## 2016-09-02 DIAGNOSIS — D709 Neutropenia, unspecified: Secondary | ICD-10-CM

## 2016-09-02 DIAGNOSIS — Z8719 Personal history of other diseases of the digestive system: Secondary | ICD-10-CM

## 2016-09-02 DIAGNOSIS — C162 Malignant neoplasm of body of stomach: Secondary | ICD-10-CM

## 2016-09-02 DIAGNOSIS — M7989 Other specified soft tissue disorders: Secondary | ICD-10-CM

## 2016-09-02 DIAGNOSIS — G893 Neoplasm related pain (acute) (chronic): Secondary | ICD-10-CM | POA: Insufficient documentation

## 2016-09-02 DIAGNOSIS — Z7689 Persons encountering health services in other specified circumstances: Secondary | ICD-10-CM

## 2016-09-02 DIAGNOSIS — C786 Secondary malignant neoplasm of retroperitoneum and peritoneum: Secondary | ICD-10-CM

## 2016-09-02 DIAGNOSIS — C801 Malignant (primary) neoplasm, unspecified: Secondary | ICD-10-CM

## 2016-09-02 NOTE — Progress Notes (Signed)
DISCONTINUE ON PATHWAY REGIMEN - Gastroesophageal     A cycle is every 28 days:     Ramucirumab      Paclitaxel   **Always confirm dose/schedule in your pharmacy ordering system**    REASON: Disease Progression PRIOR TREATMENT: GEOS14: Ramucirumab 8 mg/kg Days 1, 15 + Paclitaxel 80 mg/m2 Days 1, 8, 15 q28 Days Until Progression or Unacceptable Toxicity TREATMENT RESPONSE: Partial Response (PR)  START ON PATHWAY REGIMEN - Gastroesophageal     A cycle is 21 days:     Pembrolizumab   **Always confirm dose/schedule in your pharmacy ordering system**    Patient Characteristics: Distant Metastases (cM1/pM1) / Locally Recurrent Disease, Adenocarcinoma - Esophageal, GE Junction, and Gastric, Third Line and Beyond, MSS/pMMR or MSI Unknown, PD?L1 Expression  Positive(CPS >= 1) Histology: Adenocarcinoma Disease Classification: Gastric Therapeutic Status: Distant Metastases (No Additional Staging) Would you be surprised if this patient died  in the next year? I would be surprised if this patient died in the next year Line of therapy: Third Engineer, civil (consulting) Status: MSS/pMMR PD-L1 Expression Status: PD-L1 Positive (CPS >= 1) Intent of Therapy: Non-Curative / Palliative Intent, Discussed with Patient

## 2016-09-02 NOTE — Progress Notes (Signed)
Patient states he feels full all the time, therefore his appetite is decreased.  He states immediately after eating he has to have a BM that is usually loose to liquid.  States he is having anxiety attacks, mostly when awakening from sleep.  Also complains of sharp sensations in his left breast area.  States he is cold and hot at same time.  Feels like his feet are cold.  Has no fever.

## 2016-09-02 NOTE — Progress Notes (Signed)
[facility] day:  09/02/16  Chief Complaint: Anthony Melendez is a 36 y.o. male with metastatic gastric cancer who is seen for reassessment after recent ER visit for abdominal pain.  HPI:  The patient was last seen in the medical oncology clinic on 08/19/2016.  At that time, he noted intermittent abdominal pain.  Exam was stable.  Decision was made to continue treatment with follow-up imaging.  He received day 1 and day 8 treatment.  ANC was 1100 on 08/27/2016.  He received GCSF on 08/29/2016 secondary to prior neutropenia limiting therapy.  He presented to the ER on 08/30/2016 with upper abdominal pain a 2 days.  He noted frequent reflux.  Abdomen and pelvic CT scan revealed increased amount of ascites compared to prior exam, as well as multiple small omental densities compared to prior exam suggesting increased peritoneal carcinomatosis.  Symptomatically, he notes abdominal fullness and some loss of appetite. He notes sharp needlelike abdominal pain as well as some pain in his left chest. He is taking Percocet every 6 hours as needed for pain. He will take 1-3 tablets a day.  Pain level goes from a 10 to a 3.   Past Medical History:  Diagnosis Date  . Gastric cancer ([place]) 08/2015   Folfox chemo tx's.  Marland Kitchen GERD (gastroesophageal reflux disease)     Past Surgical History:  Procedure Laterality Date  . ESOPHAGOGASTRODUODENOSCOPY (EGD) WITH PROPOFOL N/A 08/02/2015   Procedure: ESOPHAGOGASTRODUODENOSCOPY (EGD) WITH PROPOFOL;  Surgeon: Lucilla Lame, MD;  Location: [facility];  Service: Endoscopy;  Laterality: N/A;  . ESOPHAGOGASTRODUODENOSCOPY (EGD) WITH PROPOFOL N/A 02/26/2016   Procedure: ESOPHAGOGASTRODUODENOSCOPY (EGD) WITH PROPOFOL;  Surgeon: Lucilla Lame, MD;  Location: [facility];  Service: Endoscopy;  Laterality: N/A;  . NO PAST SURGERIES    . PERIPHERAL VASCULAR CATHETERIZATION N/A 08/13/2015   Procedure: Glori Luis Cath Insertion;   Surgeon: Algernon Huxley, MD;  Location: [facility];  Service: Cardiovascular;  Laterality: N/A;    History reviewed. No pertinent family history.  Social History:  reports that he has never smoked. He has never used smokeless tobacco. He reports that he does not drink alcohol or use drugs.  The patient is from [place].  He speaks [place].  He moves to the [place] in 2000.  He lives in [place] with his mother, father, brother and sister.  He is a Theme park manager.  The patient is accompanied by his sister and the interpreter today.   Allergies:  Allergies  Allergen Reactions  . Iodine     Possible anaphylaxis per MD     Current Medications: Current Outpatient Prescriptions  Medication Sig Dispense Refill  . HYDROcodone-acetaminophen (NORCO) 7.5-325 MG tablet Take 1 tablet by mouth every 4 (four) hours as needed for moderate pain. 90 tablet 0  . hydrOXYzine (ATARAX/VISTARIL) 25 MG tablet Take 25 mg by mouth daily.    Marland Kitchen levocetirizine (XYZAL) 5 MG tablet Take 5 mg by mouth every evening.    Marland Kitchen omeprazole (PRILOSEC) 20 MG capsule Take 1 capsule (20 mg total) by mouth 2 (two) times daily before a meal. 60 capsule 2  . ondansetron (ZOFRAN) 8 MG tablet Take 1 tablet (8 mg total) by mouth 2 (two) times daily as needed (Nausea or vomiting). 30 tablet 1  . oxyCODONE-acetaminophen (ROXICET) 5-325 MG tablet Take 1 tablet by mouth every 6 (six) hours as needed. 20 tablet 0  . potassium chloride (K-DUR) 10 MEQ tablet Take 1 tablet (10  mEq total) by mouth 2 (two) times daily. Take 1 tablet daily 60 tablet 1  . prochlorperazine (COMPAZINE) 10 MG tablet Take 1 tablet (10 mg total) by mouth every 6 (six) hours as needed for nausea or vomiting. 30 tablet 1  . Diphenhyd-Hydrocort-Nystatin (FIRST-DUKES MOUTHWASH) SUSP Use as directed 10 mLs in the mouth or throat 4 (four) times daily as needed. (Patient not taking: Reported on 06/12/2016) 480 mL 3  . ferrous sulfate 325 (65 FE) MG EC tablet Take 1 tablet  (325 mg total) by mouth daily with breakfast. (Patient not taking: Reported on 08/30/2016) 30 tablet 3  . gabapentin (NEURONTIN) 100 MG capsule Take 1 capsule (100 mg total) by mouth 3 (three) times daily. (Patient not taking: Reported on 06/12/2016) 90 capsule 1  . lactulose (CHRONULAC) 10 GM/15ML solution Take 30 mLs (20 g total) by mouth daily as needed for mild constipation. (Patient not taking: Reported on 08/27/2016) 120 mL 0   No current facility-administered medications for this visit.    Facility-Administered Medications Ordered in Other Visits  Medication Dose Route Frequency Provider Last Rate Last Dose  . heparin lock flush 100 unit/mL  500 Units Intravenous Once Corcoran, Melissa C, MD      . pegfilgrastim (NEULASTA) injection 6 mg  6 mg Subcutaneous Once Corcoran, Melissa C, MD      . sodium chloride 0.9 % injection 10 mL  10 mL Intravenous Once Corcoran, Melissa C, MD      . sodium chloride flush (NS) 0.9 % injection 10 mL  10 mL Intravenous PRN Lequita Asal, MD   10 mL at 08/28/15 7902    Review of Systems:  GENERAL:  Feels "ok".  No fevers or sweats.  Weight down 5 pounds. PERFORMANCE STATUS (ECOG):  1 HEENT:  No visual changes, runny nose, sore throat, mouth sores or tenderness. Lungs:  No shortness of breath or cough.  No hemoptysis. Cardiac:  No chest pain, palpitations, orthopnea, or PND. GI:    Decreased oral intake.  Intermittent sharp abdominal pain (see HPI).  Some diarrhea.  No nausea, vomiting, constipation, melena or hematochezia. GU:  No urgency, frequency, dysuria, or hematuria. Musculoskeletal:  No bone pain.  No muscle tenderness. Extremities:  No pain or swelling. Skin:  No rashes or ulcers. Neuro:  No neuropathy.  No headache, numbness or weakness, balance or coordination issues. Endocrine:  No diabetes, thyroid issues, hot flashes or night sweats. Psych:  No mood changes, depression or anxiety. Pain:  Intermittent abdominal pain (see HPI). Review of  systems:  All other systems reviewed and found to be negative.  Physical Exam: Blood pressure 132/87, pulse 96, temperature 98.3 F (36.8 C), temperature source Tympanic, resp. rate 18, weight 180 lb 7 oz (81.8 kg). GENERAL:  Well developed, well nourished, gentleman sitting comfortably in the exam room in no acute distress. MENTAL STATUS:  Alert and oriented to person, place and time. HEAD:  Wearing a turquoise cap. Thin mustache.  Normocephalic, atraumatic, face symmetric, no Cushingoid features. EYES:  Brown eyes.  Pupils equal rounfd and reactive to light and accomodation.  No conjunctivitis or scleral icterus. ENT:  Oropharynx clear without lesion.  Tongue normal. Mucous membranes moist.  RESPIRATORY:  Clear to auscultation without rales, wheezes or rhonchi. CARDIOVASCULAR:  Regular rate and rhythm without murmur, rub or gallop. ABDOMEN:  Non-distended.  Soft, slightly tender in the upper quadrants without guarding or rebound tenderness.  Active bowel sounds and no hepatosplenomegaly.  No masses.  No significant  shifting dullness. SKIN:  No urticaria.  No rashes or ulcers. EXTREMITIES: No edema, no skin discoloration or tenderness.  No palpable cords. LYMPH NODES: No palpable cervical, supraclavicular, axillary or inguinal adenopathy  NEUROLOGICAL: Unremarkable. PSYCH:  Appropriate.   Imaging studies: 07/29/2015:  Chest, abdomen and pelvic CT revealed a 4 cm thickened and irregular gastric wall compatible with an infiltrative malignancy. There was extensive involvement of the upper mesentery and omentum compatible with peritoneal carcinomatosis. There was a small amount of ascites.    08/08/2015:  Head CT revealed no evidence of metastatic disease. 10/05/2015:  Abdomen and pelvic CT revealed a positive response to therapy with decreased size of the primary mass along the lesser curvature of the stomach, decrease extension of the mass into the gastrohepatic ligament, decreasing evidence of  peritoneal carcinomatosis involving predominantly the omentum, and resolution of previously noted malignant ascites. There was a potential ulcer along the greater curvature of the mid body of the stomach.   12/24/2015:  Abdomen and pelvic CT revealed further improvement in primary gastric malignancy and adjacent lymphadenopathy. There was no residual peritoneal carcinomatosis identified. 03/21/2016:  Abdominal CT revealed new small volume ascites within the abdomen extending over the liver and spleen.  There was increase soft tissue stranding throughout the peritoneal cavity worrisome for recurrence of peritoneal carcinomatosis. 04/24/2016:  Abdomen and pelvic CT revealed progressive intraperitoneal metastatic disease with increased ascites and omental caking. 06/17/2016:  Abdomen and pelvic CT revealed decreased omental metastatic disease.  The volume of ascites had decreased. 08/30/2016:  Abdomen and pelvic CT revealed increased amount of ascites compared to prior exam, as well as multiple small omental densities compared to prior exam suggesting increased peritoneal carcinomatosis.   No visits with results within 3 Day(s) from this visit.  Latest known visit with results is:  Admission on 08/30/2016, Discharged on 08/30/2016  Component Date Value Ref Range Status  . Lipase 08/30/2016 21  11 - 51 U/L Final  . Sodium 08/30/2016 140  135 - 145 mmol/L Final  . Potassium 08/30/2016 3.6  3.5 - 5.1 mmol/L Final  . Chloride 08/30/2016 107  101 - 111 mmol/L Final  . CO2 08/30/2016 26  22 - 32 mmol/L Final  . Glucose, Bld 08/30/2016 98  65 - 99 mg/dL Final  . BUN 08/30/2016 13  6 - 20 mg/dL Final  . Creatinine, Ser 08/30/2016 0.66  0.61 - 1.24 mg/dL Final  . Calcium 08/30/2016 8.6* 8.9 - 10.3 mg/dL Final  . Total Protein 08/30/2016 6.6  6.5 - 8.1 g/dL Final  . Albumin 08/30/2016 3.7  3.5 - 5.0 g/dL Final  . AST 08/30/2016 37  15 - 41 U/L Final  . ALT 08/30/2016 35  17 - 63 U/L Final  . Alkaline  Phosphatase 08/30/2016 81  38 - 126 U/L Final  . Total Bilirubin 08/30/2016 1.3* 0.3 - 1.2 mg/dL Final  . GFR calc non Af Amer 08/30/2016 >60  >60 mL/min Final  . GFR calc Af Amer 08/30/2016 >60  >60 mL/min Final   Comment: (NOTE) The eGFR has been calculated using the CKD EPI equation. This calculation has not been validated in all clinical situations. eGFR's persistently <60 mL/min signify possible Chronic Kidney Disease.   . Anion gap 08/30/2016 7  5 - 15 Final  . WBC 08/30/2016 5.9  3.8 - 10.6 K/uL Final  . RBC 08/30/2016 4.57  4.40 - 5.90 MIL/uL Final  . Hemoglobin 08/30/2016 12.8* 13.0 - 18.0 g/dL Final  . HCT 08/30/2016 38.7*  40.0 - 52.0 % Final  . MCV 08/30/2016 84.7  80.0 - 100.0 fL Final  . MCH 08/30/2016 27.9  26.0 - 34.0 pg Final  . MCHC 08/30/2016 33.0  32.0 - 36.0 g/dL Final  . RDW 08/30/2016 17.5* 11.5 - 14.5 % Final  . Platelets 08/30/2016 97* 150 - 440 K/uL Final    Assessment:  Farhaan Mabee is a 36 y.o. male with metastatic gastric cancer.  He presented with a 4-6 week history of progressive epigastric pain.  EGD on 08/02/2015 revealed a large, ulcerated, non-circumferential mass with oozing and stigmata of recent bleeding in the entire examined stomach.  The mass involved the fundus down to the antrum.  Biopsies revealed poorly differentiated adenocarcinoma.  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  Her2/neu testing was negative.  Microsatellite testing was negative.  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  PDL-1 was expressed.  CEA was 0.3 on 07/30/2015.  Chest, abdomen and pelvic CT scan on 07/29/2015 revealed a 4 cm thickened and irregular gastric wall compatible with an infiltrative malignancy. There was extensive involvement of the upper mesentery and omentum compatible with  peritoneal carcinomatosis. There was a small amount of ascites.     He completed treatment for H pylori  (began 08/09/2015).  Stool was negative for H pylori on 11/01/2015.  He has iron deficiency anemia likely gastric oozing and modest diet.  RBC are microcytic.  Labs on 08/21/2015 revealed a ferritin was 44 with an iron saturation 4% and a TIBC of 395.  Ferritin was 116 on 09/18/2015.  He received 12 cycles of FOLFOX chemotherapy (08/14/2015 - 02/05/2016).  Chemotherapy was held on 10/23/2015 secondary to thrombocytopenia (87,000).  Platelet count was 90,000 prior to cycle #11.  He has tolerated his chemotherapy well without diarrhea, mouth sores, or neuropathy.  EGD on 02/26/2016 revealed one non-bleeding gastric ulcer.   The mass appeared smaller.  Biopsy revealed moderate chronic gastritis and edema, negative for H pylori, dysplasia and malignancy.  He received 3 cycles of maintenance 5FU/LV (03/03/2016 - 04/08/2016).  He received 5 cycles of ramucirumab + Taxol (05/01/2016 - 08/20/2016).  He notes slight transient tingling in his fingertips.  Chemotherapy was held on day 15 of cycle #3 secondary to neutropenia (ANC 600).  Cycle #5 was truncated early secondary to progressive disease.  He receives Granix if WBC < 3,000 or ANC < 1500.  Abdomen and pelvic CT on 08/30/2016 revealed increased amount of ascites compared to prior exam, as well as multiple small omental densities compared to prior exam suggesting increased peritoneal carcinomatosis.  He has a history of urticaria of unclear etiology.  He is followed by dermatology.  He takes Zyrtec every morning and hydroxyzine at night.  He takes Xyzal 5 mg daily.  He was instructed to use mild soaps and not to use perfumes.   Symptomatically, he has intermittent sharp abdominal pain.  Pain is controlled with Percocet prn.  Abdomen is non-distended.  Plan: 1.  Review interval CT scan.  Discuss progressive disease and discontinuation of Taxol and  ramucirumab. 2.  Discuss initiation of pembrolizumab Gaspar Skeeters) based on PDL-1 status.  Side effects reviewed.  Information provided. 3.  Preauth pembrolizumab. 4.  Chest CT: staging. 5.  RTC next week for MD assess, labs (CBC with diff, CMP, Mg, LDH, TSH), and cycle #1 pembrolizumab.   Lequita Asal, MD  09/02/2016, 8:45 AM

## 2016-09-03 ENCOUNTER — Inpatient Hospital Stay: Payer: Self-pay

## 2016-09-04 ENCOUNTER — Ambulatory Visit
Admission: RE | Admit: 2016-09-04 | Discharge: 2016-09-04 | Disposition: A | Discharge status: Home / Self Care | Payer: Self-pay | Source: Ambulatory Visit | Attending: Hematology and Oncology | Admitting: Hematology and Oncology

## 2016-09-04 DIAGNOSIS — R180 Malignant ascites: Secondary | ICD-10-CM | POA: Insufficient documentation

## 2016-09-04 DIAGNOSIS — C162 Malignant neoplasm of body of stomach: Secondary | ICD-10-CM | POA: Insufficient documentation

## 2016-09-04 DIAGNOSIS — C801 Malignant (primary) neoplasm, unspecified: Secondary | ICD-10-CM | POA: Insufficient documentation

## 2016-09-04 DIAGNOSIS — C786 Secondary malignant neoplasm of retroperitoneum and peritoneum: Secondary | ICD-10-CM | POA: Insufficient documentation

## 2016-09-04 DIAGNOSIS — R188 Other ascites: Secondary | ICD-10-CM | POA: Insufficient documentation

## 2016-09-09 ENCOUNTER — Inpatient Hospital Stay: Payer: Self-pay

## 2016-09-09 ENCOUNTER — Encounter: Payer: Self-pay | Admitting: Hematology and Oncology

## 2016-09-09 ENCOUNTER — Inpatient Hospital Stay: Payer: Self-pay | Attending: Hematology and Oncology

## 2016-09-09 ENCOUNTER — Telehealth: Payer: Self-pay | Admitting: Hematology and Oncology

## 2016-09-09 ENCOUNTER — Other Ambulatory Visit: Payer: Self-pay | Admitting: Hematology and Oncology

## 2016-09-09 ENCOUNTER — Inpatient Hospital Stay (HOSPITAL_BASED_OUTPATIENT_CLINIC_OR_DEPARTMENT_OTHER): Payer: Self-pay | Admitting: Hematology and Oncology

## 2016-09-09 VITALS — BP 124/86 | HR 76 | Temp 98.2°F | Resp 18 | Wt 181.1 lb

## 2016-09-09 DIAGNOSIS — R188 Other ascites: Secondary | ICD-10-CM

## 2016-09-09 DIAGNOSIS — M549 Dorsalgia, unspecified: Secondary | ICD-10-CM

## 2016-09-09 DIAGNOSIS — Z5112 Encounter for antineoplastic immunotherapy: Secondary | ICD-10-CM | POA: Insufficient documentation

## 2016-09-09 DIAGNOSIS — C801 Malignant (primary) neoplasm, unspecified: Secondary | ICD-10-CM

## 2016-09-09 DIAGNOSIS — C786 Secondary malignant neoplasm of retroperitoneum and peritoneum: Secondary | ICD-10-CM

## 2016-09-09 DIAGNOSIS — Z1509 Genetic susceptibility to other malignant neoplasm: Secondary | ICD-10-CM | POA: Insufficient documentation

## 2016-09-09 DIAGNOSIS — Z9221 Personal history of antineoplastic chemotherapy: Secondary | ICD-10-CM

## 2016-09-09 DIAGNOSIS — R180 Malignant ascites: Secondary | ICD-10-CM

## 2016-09-09 DIAGNOSIS — L509 Urticaria, unspecified: Secondary | ICD-10-CM

## 2016-09-09 DIAGNOSIS — D509 Iron deficiency anemia, unspecified: Secondary | ICD-10-CM | POA: Insufficient documentation

## 2016-09-09 DIAGNOSIS — Z8719 Personal history of other diseases of the digestive system: Secondary | ICD-10-CM | POA: Insufficient documentation

## 2016-09-09 DIAGNOSIS — K219 Gastro-esophageal reflux disease without esophagitis: Secondary | ICD-10-CM | POA: Insufficient documentation

## 2016-09-09 DIAGNOSIS — C162 Malignant neoplasm of body of stomach: Secondary | ICD-10-CM | POA: Insufficient documentation

## 2016-09-09 DIAGNOSIS — R0781 Pleurodynia: Secondary | ICD-10-CM

## 2016-09-09 DIAGNOSIS — Z8711 Personal history of peptic ulcer disease: Secondary | ICD-10-CM | POA: Insufficient documentation

## 2016-09-09 DIAGNOSIS — Z79899 Other long term (current) drug therapy: Secondary | ICD-10-CM

## 2016-09-09 DIAGNOSIS — Z7189 Other specified counseling: Secondary | ICD-10-CM

## 2016-09-09 LAB — COMPREHENSIVE METABOLIC PANEL
ALT: 18 U/L (ref 17–63)
AST: 34 U/L (ref 15–41)
Albumin: 3.9 g/dL (ref 3.5–5.0)
Alkaline Phosphatase: 93 U/L (ref 38–126)
Anion gap: 10 (ref 5–15)
BUN: 11 mg/dL (ref 6–20)
CO2: 22 mmol/L (ref 22–32)
Calcium: 8.6 mg/dL — ABNORMAL LOW (ref 8.9–10.3)
Chloride: 106 mmol/L (ref 101–111)
Creatinine, Ser: 0.76 mg/dL (ref 0.61–1.24)
GFR calc Af Amer: >60 mL/min (ref >60)
GFR calc non Af Amer: >60 mL/min (ref >60)
Glucose, Bld: 164 mg/dL — ABNORMAL HIGH (ref 65–99)
Potassium: 3.6 mmol/L (ref 3.5–5.1)
Sodium: 138 mmol/L (ref 135–145)
Total Bilirubin: 0.4 mg/dL (ref 0.3–1.2)
Total Protein: 7 g/dL (ref 6.5–8.1)

## 2016-09-09 LAB — CBC WITH DIFFERENTIAL/PLATELET
Basophils Absolute: 0 10*3/uL (ref 0–0.1)
Basophils Relative: 1 %
Eosinophils Absolute: 0 10*3/uL (ref 0–0.7)
Eosinophils Relative: 1 %
HCT: 36.7 % — ABNORMAL LOW (ref 40.0–52.0)
Hemoglobin: 12.6 g/dL — ABNORMAL LOW (ref 13.0–18.0)
Lymphocytes Relative: 37 %
Lymphs Abs: 1.1 10*3/uL (ref 1.0–3.6)
MCH: 28.6 pg (ref 26.0–34.0)
MCHC: 34.2 g/dL (ref 32.0–36.0)
MCV: 83.7 fL (ref 80.0–100.0)
Monocytes Absolute: 0.3 10*3/uL (ref 0.2–1.0)
Monocytes Relative: 10 %
Neutro Abs: 1.5 10*3/uL (ref 1.4–6.5)
Neutrophils Relative %: 51 %
Platelets: 206 10*3/uL (ref 150–440)
RBC: 4.39 MIL/uL — ABNORMAL LOW (ref 4.40–5.90)
RDW: 17.3 % — ABNORMAL HIGH (ref 11.5–14.5)
WBC: 3 10*3/uL — ABNORMAL LOW (ref 3.8–10.6)

## 2016-09-09 LAB — LACTATE DEHYDROGENASE: LDH: 125 U/L (ref 98–192)

## 2016-09-09 LAB — MAGNESIUM: Magnesium: 2.1 mg/dL (ref 1.7–2.4)

## 2016-09-09 LAB — TSH: TSH: 3.289 u[IU]/mL (ref 0.350–4.500)

## 2016-09-09 MED ORDER — HEPARIN SOD (PORK) LOCK FLUSH 100 UNIT/ML IV SOLN
500.0000 [IU] | Freq: Once | INTRAVENOUS | Status: AC
  Administered 2016-09-09: 500 [IU] via INTRAVENOUS
  Filled 2016-09-09: qty 5

## 2016-09-09 MED ORDER — SODIUM CHLORIDE 0.9% FLUSH
10.0000 mL | INTRAVENOUS | Status: DC | PRN
  Filled 2016-09-09: qty 10

## 2016-09-09 MED ORDER — HEPARIN SOD (PORK) LOCK FLUSH 100 UNIT/ML IV SOLN: 500.0000 [IU] | Freq: Once | INTRAVENOUS | Status: DC | PRN

## 2016-09-09 MED ORDER — SODIUM CHLORIDE 0.9% FLUSH
10.0000 mL | INTRAVENOUS | Status: DC | PRN
  Administered 2016-09-09: 10 mL via INTRAVENOUS
  Filled 2016-09-09: qty 10

## 2016-09-09 MED ORDER — SODIUM CHLORIDE 0.9 % IV SOLN
Freq: Once | INTRAVENOUS | Status: AC
  Administered 2016-09-09: 10:00:00 via INTRAVENOUS
  Filled 2016-09-09: qty 1000

## 2016-09-09 MED ORDER — SODIUM CHLORIDE 0.9 % IV SOLN
200.0000 mg | Freq: Once | INTRAVENOUS | Status: AC
  Administered 2016-09-09: 200 mg via INTRAVENOUS
  Filled 2016-09-09: qty 8

## 2016-09-09 NOTE — Telephone Encounter (Signed)
Left message on scheduling voice mail with request to schedule Bone Scan, per 09/09/16 los.

## 2016-09-09 NOTE — Progress Notes (Signed)
Patient states he has not slept well in past 2 nights.  States he is eating better.  A few days ago he had back pain, but nothing today.

## 2016-09-09 NOTE — Progress Notes (Signed)
[facility] day:  09/09/16  Chief Complaint: Anthony Melendez is a 36 y.o. male with metastatic gastric cancer who is seen for assessment prior to cycle #1 pembrolizumab.  HPI:  The patient was last seen in the medical oncology clinic on 09/02/2016.  At that time, he noted intermittent sharp abdominal pain.  Abdominal and pelvic CT scans revealed increased peritoneal carcinomatosis.  We discussed initiation of pembrolizumab.  Chest CT on 09/04/2016 revealed no evidence for thoracic metastasis.  There was ascites and changes compatible with peritoneal carcinomatosis.  During the interim, he has had intermittent left anterior rib pain and back pain.  He is eating better.  He feels more uncomfortable if he eats large meals.   Past Medical History:  Diagnosis Date  . Gastric cancer ([place]) 08/2015   Folfox chemo tx's.  Marland Kitchen GERD (gastroesophageal reflux disease)     Past Surgical History:  Procedure Laterality Date  . ESOPHAGOGASTRODUODENOSCOPY (EGD) WITH PROPOFOL N/A 08/02/2015   Procedure: ESOPHAGOGASTRODUODENOSCOPY (EGD) WITH PROPOFOL;  Surgeon: Lucilla Lame, MD;  Location: [facility];  Service: Endoscopy;  Laterality: N/A;  . ESOPHAGOGASTRODUODENOSCOPY (EGD) WITH PROPOFOL N/A 02/26/2016   Procedure: ESOPHAGOGASTRODUODENOSCOPY (EGD) WITH PROPOFOL;  Surgeon: Lucilla Lame, MD;  Location: [facility];  Service: Endoscopy;  Laterality: N/A;  . NO PAST SURGERIES    . PERIPHERAL VASCULAR CATHETERIZATION N/A 08/13/2015   Procedure: Glori Luis Cath Insertion;  Surgeon: Algernon Huxley, MD;  Location: [facility];  Service: Cardiovascular;  Laterality: N/A;    History reviewed. No pertinent family history.  Social History:  reports that he has never smoked. He has never used smokeless tobacco. He reports that he does not drink alcohol or use drugs.  The patient is from [place].  He speaks [place].  He moves to the [place] in 2000.  He lives  in [place] with his mother, father, brother and sister.  He is a Theme park manager.  The patient is accompanied by his mother, sister, and the interpreter today.   Allergies:  Allergies  Allergen Reactions  . Iodine     Possible anaphylaxis per MD     Current Medications: Current Outpatient Prescriptions  Medication Sig Dispense Refill  . HYDROcodone-acetaminophen (NORCO) 7.5-325 MG tablet Take 1 tablet by mouth every 4 (four) hours as needed for moderate pain. 90 tablet 0  . hydrOXYzine (ATARAX/VISTARIL) 25 MG tablet Take 25 mg by mouth daily.    Marland Kitchen levocetirizine (XYZAL) 5 MG tablet Take 5 mg by mouth every evening.    Marland Kitchen omeprazole (PRILOSEC) 20 MG capsule Take 1 capsule (20 mg total) by mouth 2 (two) times daily before a meal. 60 capsule 2  . oxyCODONE-acetaminophen (ROXICET) 5-325 MG tablet Take 1 tablet by mouth every 6 (six) hours as needed. 20 tablet 0  . potassium chloride (K-DUR) 10 MEQ tablet Take 1 tablet (10 mEq total) by mouth 2 (two) times daily. Take 1 tablet daily 60 tablet 1  . prochlorperazine (COMPAZINE) 10 MG tablet Take 1 tablet (10 mg total) by mouth every 6 (six) hours as needed for nausea or vomiting. 30 tablet 1  . Diphenhyd-Hydrocort-Nystatin (FIRST-DUKES MOUTHWASH) SUSP Use as directed 10 mLs in the mouth or throat 4 (four) times daily as needed. (Patient not taking: Reported on 06/12/2016) 480 mL 3  . ferrous sulfate 325 (65 FE) MG EC tablet Take 1 tablet (325 mg total) by mouth daily with breakfast. (Patient not taking: Reported on 08/30/2016) 30 tablet 3  .  gabapentin (NEURONTIN) 100 MG capsule Take 1 capsule (100 mg total) by mouth 3 (three) times daily. (Patient not taking: Reported on 06/12/2016) 90 capsule 1  . lactulose (CHRONULAC) 10 GM/15ML solution Take 30 mLs (20 g total) by mouth daily as needed for mild constipation. (Patient not taking: Reported on 08/27/2016) 120 mL 0   No current facility-administered medications for this visit.    Facility-Administered  Medications Ordered in Other Visits  Medication Dose Route Frequency Provider Last Rate Last Dose  . heparin lock flush 100 unit/mL  500 Units Intravenous Once Corcoran, Melissa C, MD      . heparin lock flush 100 unit/mL  500 Units Intravenous Once Corcoran, Melissa C, MD      . pegfilgrastim (NEULASTA) injection 6 mg  6 mg Subcutaneous Once Corcoran, Melissa C, MD      . sodium chloride 0.9 % injection 10 mL  10 mL Intravenous Once Corcoran, Melissa C, MD      . sodium chloride flush (NS) 0.9 % injection 10 mL  10 mL Intravenous PRN Lequita Asal, MD   10 mL at 08/28/15 0835  . sodium chloride flush (NS) 0.9 % injection 10 mL  10 mL Intravenous PRN Lequita Asal, MD   10 mL at 09/09/16 1914    Review of Systems:  GENERAL:  Feels "the same".  No fevers or sweats.  Weight up 1 pound. PERFORMANCE STATUS (ECOG):  1 HEENT:  No visual changes, runny nose, sore throat, mouth sores or tenderness. Lungs:  No shortness of breath or cough.  No hemoptysis. Cardiac:  No chest pain, palpitations, orthopnea, or PND. GI:    Intermittent sharp abdominal pain.  Uncomfortable if eats large portions.  No nausea, vomiting, diarrhea, constipation, melena or hematochezia. GU:  No urgency, frequency, dysuria, or hematuria. Musculoskeletal:  Left anterior chest and back pain.  No muscle tenderness. Extremities:  No pain or swelling. Skin:  No rashes or ulcers. Neuro:  No neuropathy.  No headache, numbness or weakness, balance or coordination issues. Endocrine:  No diabetes, thyroid issues, hot flashes or night sweats. Psych:  No mood changes, depression or anxiety. Pain:  Intermittent abdominal pain. Review of systems:  All other systems reviewed and found to be negative.  Physical Exam: Blood pressure 124/86, pulse 76, temperature 98.2 F (36.8 C), temperature source Tympanic, resp. rate 18, weight 181 lb 1 oz (82.1 kg). GENERAL:  Well developed, well nourished, gentleman sitting comfortably in  the exam room in no acute distress. MENTAL STATUS:  Alert and oriented to person, place and time. HEAD:  Wearing a cap. Thin mustache.  Normocephalic, atraumatic, face symmetric, no Cushingoid features. EYES:  Brown eyes.  Pupils equal rounfd and reactive to light and accomodation.  No conjunctivitis or scleral icterus. ENT:  Oropharynx clear without lesion.  Tongue normal. Mucous membranes moist.  RESPIRATORY:  Clear to auscultation without rales, wheezes or rhonchi. CARDIOVASCULAR:  Regular rate and rhythm without murmur, rub or gallop. CHEST WALL:  Tender on palpation left anterior ribs. ABDOMEN:  Non-distended.  Soft, non-tender with active bowel sounds and no hepatosplenomegaly.  No masses.  No significant shifting dullness. BACK:  No pain on palpation. SKIN:  No urticaria.  No rashes or ulcers. EXTREMITIES: No edema, no skin discoloration or tenderness.  No palpable cords. LYMPH NODES: No palpable cervical, supraclavicular, axillary or inguinal adenopathy  NEUROLOGICAL: Unremarkable. PSYCH:  Appropriate.   Imaging studies: 07/29/2015:  Chest, abdomen and pelvic CT revealed a 4 cm thickened  and irregular gastric wall compatible with an infiltrative malignancy. There was extensive involvement of the upper mesentery and omentum compatible with peritoneal carcinomatosis. There was a small amount of ascites.    08/08/2015:  Head CT revealed no evidence of metastatic disease. 10/05/2015:  Abdomen and pelvic CT revealed a positive response to therapy with decreased size of the primary mass along the lesser curvature of the stomach, decrease extension of the mass into the gastrohepatic ligament, decreasing evidence of peritoneal carcinomatosis involving predominantly the omentum, and resolution of previously noted malignant ascites. There was a potential ulcer along the greater curvature of the mid body of the stomach.   12/24/2015:  Abdomen and pelvic CT revealed further improvement in primary  gastric malignancy and adjacent lymphadenopathy. There was no residual peritoneal carcinomatosis identified. 03/21/2016:  Abdominal CT revealed new small volume ascites within the abdomen extending over the liver and spleen.  There was increase soft tissue stranding throughout the peritoneal cavity worrisome for recurrence of peritoneal carcinomatosis. 04/24/2016:  Abdomen and pelvic CT revealed progressive intraperitoneal metastatic disease with increased ascites and omental caking. 06/17/2016:  Abdomen and pelvic CT revealed decreased omental metastatic disease.  The volume of ascites had decreased. 08/30/2016:  Abdomen and pelvic CT revealed increased amount of ascites compared to prior exam, as well as multiple small omental densities compared to prior exam suggesting increased peritoneal carcinomatosis. 09/04/2016:  Chest CT revealed no evidence for thoracic metastasis.   Infusion on 09/09/2016  Component Date Value Ref Range Status  . WBC 09/09/2016 3.0* 3.8 - 10.6 K/uL Final  . RBC 09/09/2016 4.39* 4.40 - 5.90 MIL/uL Final  . Hemoglobin 09/09/2016 12.6* 13.0 - 18.0 g/dL Final  . HCT 09/09/2016 36.7* 40.0 - 52.0 % Final  . MCV 09/09/2016 83.7  80.0 - 100.0 fL Final  . MCH 09/09/2016 28.6  26.0 - 34.0 pg Final  . MCHC 09/09/2016 34.2  32.0 - 36.0 g/dL Final  . RDW 09/09/2016 17.3* 11.5 - 14.5 % Final  . Platelets 09/09/2016 206  150 - 440 K/uL Final  . Neutrophils Relative % 09/09/2016 51  % Final  . Neutro Abs 09/09/2016 1.5  1.4 - 6.5 K/uL Final  . Lymphocytes Relative 09/09/2016 37  % Final  . Lymphs Abs 09/09/2016 1.1  1.0 - 3.6 K/uL Final  . Monocytes Relative 09/09/2016 10  % Final  . Monocytes Absolute 09/09/2016 0.3  0.2 - 1.0 K/uL Final  . Eosinophils Relative 09/09/2016 1  % Final  . Eosinophils Absolute 09/09/2016 0.0  0 - 0.7 K/uL Final  . Basophils Relative 09/09/2016 1  % Final  . Basophils Absolute 09/09/2016 0.0  0 - 0.1 K/uL Final    Assessment:  Jaryd Drew  is a 36 y.o. male with metastatic gastric cancer.  He presented with a 4-6 week history of progressive epigastric pain.  EGD on 08/02/2015 revealed a large, ulcerated, non-circumferential mass with oozing and stigmata of recent bleeding in the entire examined stomach.  The mass involved the fundus down to the antrum.  Biopsies revealed poorly differentiated adenocarcinoma.  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  Her2/neu testing was negative.  Microsatellite testing was negative.  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  PDL-1 was expressed.  CEA was 0.3 on 07/30/2015.  Chest, abdomen and pelvic CT scan on 07/29/2015 revealed a 4 cm thickened and irregular gastric wall compatible  with an infiltrative malignancy. There was extensive involvement of the upper mesentery and omentum compatible with peritoneal carcinomatosis. There was a small amount of ascites.     He completed treatment for H pylori  (began 08/09/2015).  Stool was negative for H pylori on 11/01/2015.  He has iron deficiency anemia likely gastric oozing and modest diet.  RBC are microcytic.  Labs on 08/21/2015 revealed a ferritin was 44 with an iron saturation 4% and a TIBC of 395.  Ferritin was 116 on 09/18/2015.  He received 12 cycles of FOLFOX chemotherapy (08/14/2015 - 02/05/2016).  Chemotherapy was held on 10/23/2015 secondary to thrombocytopenia (87,000).  Platelet count was 90,000 prior to cycle #11.  He has tolerated his chemotherapy well without diarrhea, mouth sores, or neuropathy.  EGD on 02/26/2016 revealed one non-bleeding gastric ulcer.   The mass appeared smaller.  Biopsy revealed moderate chronic gastritis and edema, negative for H pylori, dysplasia and malignancy.  He received 3 cycles of maintenance 5FU/LV (03/03/2016 - 04/08/2016).  He  received 5 cycles of ramucirumab + Taxol (05/01/2016 - 08/20/2016).  He notes slight transient tingling in his fingertips.  Chemotherapy was held on day 15 of cycle #3 secondary to neutropenia (ANC 600).  Cycle #5 was truncated early secondary to progressive disease.  He receives Granix if WBC < 3,000 or ANC < 1500.  Abdomen and pelvic CT on 08/30/2016 revealed increased amount of ascites compared to prior exam, as well as multiple small omental densities compared to prior exam suggesting increased peritoneal carcinomatosis. Chest CT on 09/04/2016 revealed no evidence for thoracic metastasis.  He has a history of urticaria of unclear etiology.  He is followed by dermatology.  He takes Zyrtec every morning and hydroxyzine at night.  He takes Xyzal 5 mg daily.  He was instructed to use mild soaps and not to use perfumes.   Symptomatically, he has intermittent left rib, back, and abdominal pain.  He feels uncomfortable if he eats large meals.  Pain is controlled with Percocet prn.  Abdomen is benign.   Plan: 1.  Labs today: CBC with diff, CMP, Mg, LDH, TSH. 2.  Review interval chest CT- no evidence of thoracic metastasis. 3.  Discuss bone scan to r/o metastatic disease. 4.  Discuss paracentesis if ascites increases and patient symptomatic. 5.  Discuss initiation of pembrolizumab.  Treatment is palliative.  Side effects reviewed.  Multiple questions asked and answered.  Patient consented to treatment. 6.  Cycle #1 pembrolizumab today. 7.  RTC in 3 weeks for MD assessment, labs (CBC with diff, CMP, Mg), and cycle #2 pembrolizumab.   Lequita Asal, MD  09/09/2016, 8:55 AM

## 2016-09-10 ENCOUNTER — Other Ambulatory Visit: Payer: Self-pay

## 2016-09-10 ENCOUNTER — Ambulatory Visit: Payer: Self-pay

## 2016-09-10 ENCOUNTER — Other Ambulatory Visit: Payer: Self-pay | Admitting: *Deleted

## 2016-09-11 MED ORDER — HYDROCODONE-ACETAMINOPHEN 7.5-325 MG PO TABS: 1.0000 | ORAL_TABLET | ORAL | 0 refills | Status: DC | PRN

## 2016-09-16 ENCOUNTER — Ambulatory Visit: Payer: Self-pay

## 2016-09-16 ENCOUNTER — Ambulatory Visit: Payer: Self-pay | Admitting: Hematology and Oncology

## 2016-09-16 ENCOUNTER — Other Ambulatory Visit: Payer: Self-pay

## 2016-09-25 ENCOUNTER — Telehealth: Payer: Self-pay | Admitting: *Deleted

## 2016-09-25 NOTE — Telephone Encounter (Addendum)
Per VO Dr Mike Gip, if abdominal distended, can do [place] to assess for fluid. He has carcinomatosis and he is going to have symptoms.   I returned call to [facility], but got message that she has no voice mail.

## 2016-09-25 NOTE — Telephone Encounter (Signed)
I spoke with Anthony Melendez and explained that not much can be done if his abdomen is not distended, that unfortunately his cancer is spread all through his abdomen and this is a part of the disease process. She stated he declines to speak with the dietician at this time

## 2016-09-25 NOTE — Telephone Encounter (Signed)
Sister Verdis Frederickson called to report that patient is complaining of feeling full and has no appetite to eat. Denies constipation or abdominal distention. Suggested that he drink ensure, boost or carnation instant breakfast for vitamins and nutrition, offered dietician consult, and told her I would discuss with physician. She will check with him to see if he wants to see dietician and call back to schedule if he agrees. Please advise

## 2016-09-26 ENCOUNTER — Encounter: Payer: Self-pay | Admitting: Emergency Medicine

## 2016-09-26 ENCOUNTER — Emergency Department: Payer: Self-pay

## 2016-09-26 ENCOUNTER — Emergency Department
Admission: EM | Admit: 2016-09-26 | Discharge: 2016-09-26 | Disposition: A | Discharge status: Home / Self Care | Payer: Self-pay | Attending: Emergency Medicine | Admitting: Emergency Medicine

## 2016-09-26 DIAGNOSIS — C162 Malignant neoplasm of body of stomach: Secondary | ICD-10-CM | POA: Insufficient documentation

## 2016-09-26 DIAGNOSIS — R1084 Generalized abdominal pain: Secondary | ICD-10-CM

## 2016-09-26 DIAGNOSIS — Z79899 Other long term (current) drug therapy: Secondary | ICD-10-CM | POA: Insufficient documentation

## 2016-09-26 DIAGNOSIS — R180 Malignant ascites: Secondary | ICD-10-CM

## 2016-09-26 DIAGNOSIS — R188 Other ascites: Secondary | ICD-10-CM | POA: Insufficient documentation

## 2016-09-26 LAB — URINALYSIS, COMPLETE (UACMP) WITH MICROSCOPIC
BILIRUBIN URINE: NEGATIVE
Bacteria, UA: NONE SEEN
Glucose, UA: NEGATIVE mg/dL
HGB URINE DIPSTICK: NEGATIVE
KETONES UR: NEGATIVE mg/dL
LEUKOCYTES UA: NEGATIVE
NITRITE: NEGATIVE
PH: 5 (ref 5.0–8.0)
Protein, ur: 30 mg/dL — AB
RBC / HPF: NONE SEEN RBC/hpf (ref 0–5)
SPECIFIC GRAVITY, URINE: 1.041 — AB (ref 1.005–1.030)

## 2016-09-26 LAB — COMPREHENSIVE METABOLIC PANEL
ALK PHOS: 106 U/L (ref 38–126)
ALT: 15 U/L — AB (ref 17–63)
AST: 34 U/L (ref 15–41)
Albumin: 3.5 g/dL (ref 3.5–5.0)
Anion gap: 7 (ref 5–15)
BILIRUBIN TOTAL: 0.4 mg/dL (ref 0.3–1.2)
BUN: 15 mg/dL (ref 6–20)
CHLORIDE: 107 mmol/L (ref 101–111)
CO2: 27 mmol/L (ref 22–32)
CREATININE: 0.84 mg/dL (ref 0.61–1.24)
Calcium: 8.6 mg/dL — ABNORMAL LOW (ref 8.9–10.3)
GFR calc non Af Amer: >60 mL/min (ref >60)
GLUCOSE: 131 mg/dL — AB (ref 65–99)
Potassium: 3.5 mmol/L (ref 3.5–5.1)
Sodium: 141 mmol/L (ref 135–145)
Total Protein: 6.6 g/dL (ref 6.5–8.1)

## 2016-09-26 LAB — BODY FLUID CELL COUNT WITH DIFFERENTIAL
Eos, Fluid: 0 %
LYMPHS FL: 50 %
MONOCYTE-MACROPHAGE-SEROUS FLUID: 9 %
NEUTROPHIL FLUID: 41 %
Other Cells, Fluid: 0 %
WBC FLUID: 175 uL

## 2016-09-26 LAB — CBC
HEMATOCRIT: 41.9 % (ref 40.0–52.0)
Hemoglobin: 13.9 g/dL (ref 13.0–18.0)
MCH: 27.7 pg (ref 26.0–34.0)
MCHC: 33.1 g/dL (ref 32.0–36.0)
MCV: 83.6 fL (ref 80.0–100.0)
PLATELETS: 125 10*3/uL — AB (ref 150–440)
RBC: 5.01 MIL/uL (ref 4.40–5.90)
RDW: 16.9 % — ABNORMAL HIGH (ref 11.5–14.5)
WBC: 5.1 10*3/uL (ref 3.8–10.6)

## 2016-09-26 LAB — GRAM STAIN: Gram Stain: NONE SEEN

## 2016-09-26 LAB — APTT: aPTT: 38 seconds — ABNORMAL HIGH (ref 24–36)

## 2016-09-26 LAB — PROTIME-INR
INR: 1.11
Prothrombin Time: 14.4 seconds (ref 11.4–15.2)

## 2016-09-26 LAB — LIPASE, BLOOD: Lipase: 25 U/L (ref 11–51)

## 2016-09-26 MED ORDER — FENTANYL CITRATE (PF) 100 MCG/2ML IJ SOLN
50.0000 ug | Freq: Once | INTRAMUSCULAR | Status: AC
  Administered 2016-09-26: 50 ug via INTRAVENOUS
  Filled 2016-09-26: qty 2

## 2016-09-26 MED ORDER — IOPAMIDOL (ISOVUE-300) INJECTION 61%
100.0000 mL | Freq: Once | INTRAVENOUS | Status: AC | PRN
  Administered 2016-09-26: 100 mL via INTRAVENOUS

## 2016-09-26 MED ORDER — ONDANSETRON HCL 4 MG/2ML IJ SOLN
4.0000 mg | Freq: Once | INTRAMUSCULAR | Status: AC
  Administered 2016-09-26: 4 mg via INTRAVENOUS

## 2016-09-26 MED ORDER — ONDANSETRON HCL 4 MG/2ML IJ SOLN
INTRAMUSCULAR | Status: AC
  Administered 2016-09-26: 4 mg via INTRAVENOUS
  Filled 2016-09-26: qty 2

## 2016-09-26 MED ORDER — IOPAMIDOL (ISOVUE-300) INJECTION 61%
30.0000 mL | Freq: Once | INTRAVENOUS | Status: AC | PRN
  Administered 2016-09-26: 30 mL via ORAL

## 2016-09-26 NOTE — ED Notes (Signed)
Pt states he is unable to tolerate the rest of the contrast. EDP notified and states to let CT know pt can go to scan. CT called and states they will get pt once they are done with current pt. Pt notified of this.

## 2016-09-26 NOTE — ED Notes (Signed)
Interpreter at bedside to explain pain medication. Also informed patient and family the paracentesis that is ordered will not be until between 1 and 2pm per radiology.

## 2016-09-26 NOTE — ED Provider Notes (Signed)
[facility] Emergency Department Provider Note    First MD Initiated Contact with Patient 09/26/16 [phone]     (approximate)  I have reviewed the triage vital signs and the nursing notes.   HISTORY  Chief Complaint Abdominal Pain   HPI Anthony Melendez is a 36 y.o. male with below list of chronic medical conditions including gastric cancer currently receiving chemotherapy oh (last chemotherapy one month ago) presents to the emergency department with 1 week history of progressive abdominal discomfort and distention. Patient denies any nausea vomiting or diarrhea. Patient denies any fever. Patient denies any constipation. Patient denies any urinary symptoms.   Past Medical History:  Diagnosis Date  . Gastric cancer ([place]) 08/2015   Folfox chemo tx's.  Marland Kitchen GERD (gastroesophageal reflux disease)     Patient Active Problem List   Diagnosis Date Noted  . Encounter for antineoplastic immunotherapy 09/09/2016  . Cancer related pain 09/02/2016  . Chemotherapy-induced neutropenia ([place]) 07/21/2016  . Goals of care, counseling/discussion 05/29/2016  . Urticaria 04/22/2016  . 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 Chronic peptic ulcer of stomach   . Encounter for antineoplastic chemotherapy 01/15/2016  . 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 Chronic gastric ulcer 10/28/2015  . Hypocalcemia 09/30/2015  . Iron deficiency anemia 08/28/2015  . Microcytic red blood cells 08/14/2015  . Hypokalemia 08/14/2015  . Malignant neoplasm of body of stomach ([place]) 08/07/2015  . Helicobacter pylori gastritis 08/02/2015  . Abnormal findings-gastrointestinal tract   . Neoplasm of digestive system   . Gastric wall thickening 08/01/2015  . 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 ESOPHAGOGASTRODUODENOSCOPY (EGD) WITH PROPOFOL N/A 08/02/2015   Procedure: ESOPHAGOGASTRODUODENOSCOPY (EGD) WITH PROPOFOL;   Surgeon: Lucilla Lame, MD;  Location: [facility];  Service: Endoscopy;  Laterality: N/A;  . ESOPHAGOGASTRODUODENOSCOPY (EGD) WITH PROPOFOL N/A 02/26/2016   Procedure: ESOPHAGOGASTRODUODENOSCOPY (EGD) WITH PROPOFOL;  Surgeon: Lucilla Lame, MD;  Location: [facility];  Service: Endoscopy;  Laterality: N/A;  . NO PAST SURGERIES    . PERIPHERAL VASCULAR CATHETERIZATION N/A 08/13/2015   Procedure: Glori Luis Cath Insertion;  Surgeon: Algernon Huxley, MD;  Location: [facility];  Service: Cardiovascular;  Laterality: N/A;    Prior to Admission medications   Medication Sig Start Date End Date Taking? Authorizing Provider  Diphenhyd-Hydrocort-Nystatin (FIRST-DUKES MOUTHWASH) SUSP Use as directed 10 mLs in the mouth or throat 4 (four) times daily as needed. Patient not taking: Reported on 06/12/2016 05/26/16   Lequita Asal, MD  ferrous sulfate 325 (65 FE) MG EC tablet Take 1 tablet (325 mg total) by mouth daily with breakfast. Patient not taking: Reported on 08/30/2016 08/28/15   Lequita Asal, MD  gabapentin (NEURONTIN) 100 MG capsule Take 1 capsule (100 mg total) by mouth 3 (three) times daily. Patient not taking: Reported on 06/12/2016 05/15/16 05/15/17  Sindy Guadeloupe, MD  HYDROcodone-acetaminophen [facility]) 7.5-325 MG tablet Take 1 tablet by mouth every 4 (four) hours as needed for moderate pain. 09/11/16   Lequita Asal, MD  hydrOXYzine (ATARAX/VISTARIL) 25 MG tablet Take 25 mg by mouth daily.    [provider]  lactulose (CHRONULAC) 10 GM/15ML solution Take 30 mLs (20 g total) by mouth daily as needed for mild constipation. Patient not taking: Reported on 08/27/2016 06/17/16   Paulette Blanch, MD  levocetirizine (XYZAL) 5 MG tablet Take 5 mg by mouth every evening.    [provider]  omeprazole (PRILOSEC) 20  MG capsule Take 1 capsule (20 mg total) by mouth 2 (two) times daily before a meal. 03/26/16   Lequita Asal, MD  potassium chloride (K-DUR) 10 MEQ tablet  Take 1 tablet (10 mEq total) by mouth 2 (two) times daily. Take 1 tablet daily 07/17/16   Lequita Asal, MD  prochlorperazine (COMPAZINE) 10 MG tablet Take 1 tablet (10 mg total) by mouth every 6 (six) hours as needed for nausea or vomiting. 02/05/16   Lequita Asal, MD    Allergies Iodine  History reviewed. No pertinent family history.  Social History Social History  Substance Use Topics  . Smoking status: Never Smoker  . Smokeless tobacco: Never Used  . Alcohol use No    Review of Systems Constitutional: No fever/chills Eyes: No visual changes. ENT: No sore throat. Cardiovascular: Denies chest pain. Respiratory: Denies shortness of breath. Gastrointestinal: Positive for abdominal pain, no nausea vomiting or diarrhea Genitourinary: Negative for dysuria. Musculoskeletal: Negative for neck pain.  Negative for back pain. Integumentary: Negative for rash. Neurological: Negative for headaches, focal weakness or numbness.   ____________________________________________   PHYSICAL EXAM:  VITAL SIGNS: ED Triage Vitals  Enc Vitals Group     BP 09/26/16 0408 (!) 135/104     Pulse Rate 09/26/16 0408 83     Resp 09/26/16 0408 18     Temp 09/26/16 0408 97.9 F (36.6 C)     Temp Source 09/26/16 0408 Oral     SpO2 09/26/16 0408 96 %     Weight 09/26/16 0408 81.6 kg (180 lb)     Height 09/26/16 0408 1.651 m ('5\' 5"'$ )     Head Circumference --      Peak Flow --      Pain Score 09/26/16 0407 5     Pain Loc --      Pain Edu? --      Excl. in [place]? --     Constitutional: Alert and oriented. Well appearing and in no acute distress. Eyes: Conjunctivae are normal.  Head: Atraumatic.Marland Kitchen Mouth/Throat: Mucous membranes are moist. Neck: No stridor.  Cardiovascular: Normal rate, regular rhythm. Good peripheral circulation. Grossly normal heart sounds. Respiratory: Normal respiratory effort.  No retractions. Lungs CTAB. Gastrointestinal: Soft and nontender. No distention.    Musculoskeletal: No lower extremity tenderness nor edema. No gross deformities of extremities. Neurologic:  Normal speech and language. No gross focal neurologic deficits are appreciated.  Skin:  Skin is warm, dry and intact. No rash noted. Psychiatric: Mood and affect are normal. Speech and behavior are normal.  ____________________________________________   LABS (all labs ordered are listed, but only abnormal results are displayed)  Labs Reviewed  COMPREHENSIVE METABOLIC PANEL - Abnormal; Notable for the following:       Result Value   Glucose, Bld 131 (*)    Calcium 8.6 (*)    ALT 15 (*)    All other components within normal limits  CBC - Abnormal; Notable for the following:    RDW 16.9 (*)    Platelets 125 (*)    All other components within normal limits  LIPASE, BLOOD  URINALYSIS, COMPLETE (UACMP) WITH MICROSCOPIC      Procedures   ____________________________________________   INITIAL IMPRESSION / ASSESSMENT AND PLAN / ED COURSE  Pertinent labs & imaging results that were available during my care of the patient were reviewed by me and considered in my medical decision making (see chart for details).   36 year old male with above stated history presenting with  epigastric abdominal discomfort and distention. CT scan of the abdomen pending at this time. Patient's care transferred to Dr. Burlene Arnt    ____________________________________  FINAL CLINICAL IMPRESSION(S) / ED DIAGNOSES  Final diagnoses:  Ascites  Generalized abdominal pain     MEDICATIONS GIVEN DURING THIS VISIT:  Medications - No data to display   NEW OUTPATIENT MEDICATIONS STARTED DURING THIS VISIT:  New Prescriptions   No medications on file    Modified Medications   No medications on file    Discontinued Medications   No medications on file     Note:  This document was prepared using Dragon voice recognition software and may include unintentional dictation errors.    Gregor Hams, MD 09/28/16 [phone]

## 2016-09-26 NOTE — ED Notes (Signed)
Pt returned from US paracentesis

## 2016-09-26 NOTE — ED Notes (Addendum)
Pt administered IV zofran to help with ingesting PO contrast.  Pt states he is not able to urinate at this time.

## 2016-09-26 NOTE — ED Provider Notes (Signed)
-----------------------------------------   8:23 AM on 09/26/2016 -----------------------------------------  Patient with a nonsurgical abdomen, low suspicion for SBP given no fever no white count chronic abdominal pain. However, patient does have worsening ascites. I talked to Dr. Susy Manor, of oncology who manages the patient. She feels the patient likely can go home but she feels that perhaps a therapeutic tap of his ascites might help him feel better. I talked to IR and they will do it. Although a very low suspicion that the patient has SBP, because we are going to tap him, I will also sendulture etc.   Schuyler Amor, MD 09/26/16 [phone]

## 2016-09-26 NOTE — Procedures (Signed)
US paracentesis without difficulty  Complications:  None  Blood Loss: none  See dictation in canopy pacs  

## 2016-09-26 NOTE — ED Notes (Signed)
Pt able to tolerate 136ml of contrast at this time, pt to continue to try and drink bottle of contrast. Pt states he does not need to urinate at this time.

## 2016-09-26 NOTE — ED Triage Notes (Signed)
Pt ambulatory to tx room, report abd pain x 1 week, denies n/v/d, states current hx of stomach cancer, receiving chemo, last tx about a month ago.  Pt states pain similar to past pain associated with the cancer.

## 2016-09-26 NOTE — Discharge Instructions (Addendum)
Paracentesis  (Paracentesis)    La paracentesis es un procedimiento que se realiza para extraer el exceso de lquido (ascitis) del vientre (abdomen). La ascitis puede deberse a algunas afecciones, como infeccin, inflamacin, lesin abdominal, insuficiencia cardaca, fibrosis heptica crnica (cirrosis) o cncer. La ascitis se extrae con una aguja que se inserta en el abdomen a travs de la piel y de los tejidos.    Este procedimiento se puede realizar para lo siguiente:   Determinar la causa de la ascitis.   Aliviar los sntomas causados por la ascitis, como dolor o falta de aire.   Saber si hay hemorragia despus de una lesin intestinal.    INFORME A SU MDICO:   Cualquier alergia que tenga.   Todos los medicamentos que utiliza, incluidos vitaminas, hierbas, gotas oftlmicas, cremas y medicamentos de venta libre.   Problemas previos que usted o los miembros de su familia hayan tenido con el uso de anestsicos.   Enfermedades de la sangre que tenga.   Si tiene cirugas previas.   Cualquier enfermedad que tenga.   Si est embarazada o podra estarlo.    RIESGOS Y COMPLICACIONES  En general, se trata de un procedimiento seguro. Sin embargo, pueden presentarse problemas, por ejemplo:   Infeccin.   Hemorragia.   Lesin en un rgano del abdomen, por ejemplo, el intestino (intestino grueso), el hgado, el bazo o la vejiga.   Presin arterial baja (hipotensin).   Diseminacin del cncer, si hay clulas cancerosas en el lquido abdominal.   Cambios en el estado mental de las personas que padecen enfermedad heptica. Los factores causantes de estos cambios sern las alteraciones en el equilibrio de los lquidos y los minerales (electrolitos) en el organismo.    ANTES DEL PROCEDIMIENTO   Consulte a su mdico acerca de estos temas:  ? Cambiar o suspender los medicamentos que toma habitualmente. Esto es muy importante si toma medicamentos para la diabetes o anticoagulantes.  ? Tomar medicamentos, como  aspirina e ibuprofeno. Estos medicamentos pueden tener un efecto anticoagulante en la sangre. No tome estos medicamentos antes del procedimiento si el mdico le indica que no lo haga.   Le tomarn una muestra de sangre para determinar el tiempo de coagulacin.   Le pedirn que orine.    PROCEDIMIENTO   Tal vez le pidan que se acueste boca arriba con la cabeza elevada.   Para reducir el riesgo de infecciones:  ? El equipo mdico se lavar o se desinfectar las manos.  ? Le lavarn la piel con jabn.   Le administrarn un medicamento para adormecer la zona (anestesia local).   Mediante el uso de una aguja o de un bistur, le harn una puncin en la piel del abdomen.   Le introducirn un tubo de drenaje a travs del lugar de la puncin. El lquido drenar a travs del tubo y se recoger en un recipiente.   Una vez que se haya extrado el lquido, se retirar el tubo.   Se enviar una muestra del lquido para ser examinado.   Se colocar una venda (vendaje) sobre el lugar de la puncin.  Este procedimiento puede variar segn el mdico y el hospital.    DESPUS DEL PROCEDIMIENTO   Es su responsabilidad retirar el resultado del estudio. Pregntele al mdico o consulte en el departamento donde se realice el estudio cundo estarn listos los resultados.  Esta informacin no tiene como fin reemplazar el consejo del mdico. Asegrese de hacerle al mdico cualquier pregunta que tenga.      Document Released: 05/08/2008 Document Revised: 10/11/2014 Document Reviewed: 04/04/2014  Elsevier Interactive Patient Education  2018 Elsevier Inc.

## 2016-09-26 NOTE — ED Notes (Signed)
Pt up to bathroom drinking gatorade, encouraged patient to eat.

## 2016-09-26 NOTE — ED Notes (Signed)
Urinal placed at pt's bedside and notified urine sample is needed, pt verbalizes understanding of this.

## 2016-09-26 NOTE — ED Notes (Signed)
Patient given food and drink, pt

## 2016-09-26 NOTE — ED Notes (Signed)
Pt transported to U/S for paracentesis  °

## 2016-09-26 NOTE — ED Notes (Addendum)
Pt denies N/V/D, fever or CP.

## 2016-09-29 LAB — BODY FLUID CULTURE: CULTURE: NO GROWTH

## 2016-09-30 ENCOUNTER — Encounter: Payer: Self-pay | Admitting: Hematology and Oncology

## 2016-09-30 ENCOUNTER — Inpatient Hospital Stay: Payer: Self-pay

## 2016-09-30 ENCOUNTER — Other Ambulatory Visit: Payer: Self-pay | Admitting: Hematology and Oncology

## 2016-09-30 ENCOUNTER — Inpatient Hospital Stay (HOSPITAL_BASED_OUTPATIENT_CLINIC_OR_DEPARTMENT_OTHER): Payer: Self-pay | Admitting: Hematology and Oncology

## 2016-09-30 ENCOUNTER — Telehealth: Payer: Self-pay | Admitting: *Deleted

## 2016-09-30 VITALS — BP 118/88 | HR 81 | Temp 98.6°F | Resp 18 | Wt 179.6 lb

## 2016-09-30 DIAGNOSIS — Z7189 Other specified counseling: Secondary | ICD-10-CM

## 2016-09-30 DIAGNOSIS — C162 Malignant neoplasm of body of stomach: Secondary | ICD-10-CM

## 2016-09-30 DIAGNOSIS — C786 Secondary malignant neoplasm of retroperitoneum and peritoneum: Secondary | ICD-10-CM

## 2016-09-30 DIAGNOSIS — R188 Other ascites: Secondary | ICD-10-CM

## 2016-09-30 DIAGNOSIS — L509 Urticaria, unspecified: Secondary | ICD-10-CM

## 2016-09-30 DIAGNOSIS — Z79899 Other long term (current) drug therapy: Secondary | ICD-10-CM

## 2016-09-30 DIAGNOSIS — R0781 Pleurodynia: Secondary | ICD-10-CM

## 2016-09-30 DIAGNOSIS — Z5112 Encounter for antineoplastic immunotherapy: Secondary | ICD-10-CM

## 2016-09-30 DIAGNOSIS — D509 Iron deficiency anemia, unspecified: Secondary | ICD-10-CM

## 2016-09-30 DIAGNOSIS — Z9221 Personal history of antineoplastic chemotherapy: Secondary | ICD-10-CM

## 2016-09-30 DIAGNOSIS — Z8711 Personal history of peptic ulcer disease: Secondary | ICD-10-CM

## 2016-09-30 DIAGNOSIS — K219 Gastro-esophageal reflux disease without esophagitis: Secondary | ICD-10-CM

## 2016-09-30 DIAGNOSIS — R180 Malignant ascites: Secondary | ICD-10-CM | POA: Insufficient documentation

## 2016-09-30 DIAGNOSIS — C801 Malignant (primary) neoplasm, unspecified: Secondary | ICD-10-CM

## 2016-09-30 DIAGNOSIS — M549 Dorsalgia, unspecified: Secondary | ICD-10-CM

## 2016-09-30 DIAGNOSIS — Z8719 Personal history of other diseases of the digestive system: Secondary | ICD-10-CM

## 2016-09-30 DIAGNOSIS — Z1509 Genetic susceptibility to other malignant neoplasm: Secondary | ICD-10-CM

## 2016-09-30 LAB — CBC WITH DIFFERENTIAL/PLATELET
Basophils Absolute: 0.1 10*3/uL (ref 0–0.1)
Basophils Relative: 1 %
Eosinophils Absolute: 0.3 10*3/uL (ref 0–0.7)
Eosinophils Relative: 4 %
HCT: 43.5 % (ref 40.0–52.0)
Hemoglobin: 14.8 g/dL (ref 13.0–18.0)
Lymphocytes Relative: 18 %
Lymphs Abs: 1.4 10*3/uL (ref 1.0–3.6)
MCH: 28.4 pg (ref 26.0–34.0)
MCHC: 34 g/dL (ref 32.0–36.0)
MCV: 83.4 fL (ref 80.0–100.0)
Monocytes Absolute: 0.5 10*3/uL (ref 0.2–1.0)
Monocytes Relative: 7 %
Neutro Abs: 5.2 10*3/uL (ref 1.4–6.5)
Neutrophils Relative %: 70 %
Platelets: 159 10*3/uL (ref 150–440)
RBC: 5.22 MIL/uL (ref 4.40–5.90)
RDW: 16.8 % — ABNORMAL HIGH (ref 11.5–14.5)
WBC: 7.4 10*3/uL (ref 3.8–10.6)

## 2016-09-30 LAB — COMPREHENSIVE METABOLIC PANEL
ALT: 14 U/L — ABNORMAL LOW (ref 17–63)
AST: 27 U/L (ref 15–41)
Albumin: 3.2 g/dL — ABNORMAL LOW (ref 3.5–5.0)
Alkaline Phosphatase: 107 U/L (ref 38–126)
Anion gap: 8 (ref 5–15)
BUN: 9 mg/dL (ref 6–20)
CO2: 26 mmol/L (ref 22–32)
Calcium: 8.5 mg/dL — ABNORMAL LOW (ref 8.9–10.3)
Chloride: 102 mmol/L (ref 101–111)
Creatinine, Ser: 0.72 mg/dL (ref 0.61–1.24)
GFR calc Af Amer: >60 mL/min (ref >60)
GFR calc non Af Amer: >60 mL/min (ref >60)
Glucose, Bld: 149 mg/dL — ABNORMAL HIGH (ref 65–99)
Potassium: 3.4 mmol/L — ABNORMAL LOW (ref 3.5–5.1)
Sodium: 136 mmol/L (ref 135–145)
Total Bilirubin: 0.5 mg/dL (ref 0.3–1.2)
Total Protein: 6.3 g/dL — ABNORMAL LOW (ref 6.5–8.1)

## 2016-09-30 LAB — MAGNESIUM: Magnesium: 1.9 mg/dL (ref 1.7–2.4)

## 2016-09-30 LAB — CYTOLOGY - NON PAP

## 2016-09-30 MED ORDER — SODIUM CHLORIDE 0.9% FLUSH
10.0000 mL | INTRAVENOUS | Status: DC | PRN
  Administered 2016-09-30: 10 mL
  Filled 2016-09-30: qty 10

## 2016-09-30 MED ORDER — HEPARIN SOD (PORK) LOCK FLUSH 100 UNIT/ML IV SOLN
500.0000 [IU] | Freq: Once | INTRAVENOUS | Status: AC | PRN
Start: 2016-09-30 — End: 2016-09-30
  Administered 2016-09-30: 500 [IU]
  Filled 2016-09-30: qty 5

## 2016-09-30 MED ORDER — SODIUM CHLORIDE 0.9 % IV SOLN
Freq: Once | INTRAVENOUS | Status: AC
  Administered 2016-09-30: 11:00:00 via INTRAVENOUS
  Filled 2016-09-30: qty 1000

## 2016-09-30 MED ORDER — PEMBROLIZUMAB CHEMO INJECTION 100 MG/4ML
200.0000 mg | Freq: Once | INTRAVENOUS | Status: AC
  Administered 2016-09-30: 200 mg via INTRAVENOUS
  Filled 2016-09-30: qty 8

## 2016-09-30 NOTE — Progress Notes (Signed)
Potassium 3.4 today. Encouraged patient to eat potassium rich foods such as orange juice, bananas, and baked potatoes with skin to increase potassium level. Pt verbalized understanding. Family at chairside.

## 2016-09-30 NOTE — Telephone Encounter (Signed)
Entered in error

## 2016-09-30 NOTE — Progress Notes (Signed)
Patient went to ED this week due to feeling bloated. Patient states they removed 5 L fluid.   Since then his appetite has improved.  He only has abdominal pain when stretching or lying on his left side.

## 2016-09-30 NOTE — Progress Notes (Signed)
[facility] day:  09/30/16  Chief Complaint: Anthony Melendez is a 36 y.o. male with metastatic gastric cancer who is seen for assessment prior to cycle #2 pembrolizumab.  HPI:  The patient was last seen in the medical oncology clinic on 09/09/2016.  At that time, he had intermittent left anterior rib and back pain.  He was eating better, but felt uncomfortable eating large meals.  Abdominal and pelvic CT revealed increased peritoneal carcinomatosis.  He received cycle #1 pembrolizumab.  He was seen in the [facility] on 09/26/2016 with abdominal discomfort and distention.  Abdomen and pelvic CT revealed extensive ascites, multiple omental metastasis (stable), wall thickening in the gastric antrum and proximal duodenum (stable) and no evidence of obstruction.  He underwent paracentesis of 5.6 liters of clear yellow fluid on 09/26/2016.  Gram stain and culture were negative.  Cytology is pending.  During the interim, he has felt "better". Patient's abdominal pain has improved. He states, "It only hurts when they press on it, or when I lie on my left side".  He is eating well and having regular bowel movements since the paracentesis was performed. Patient denies nausea, vomiting, and significant weight loss.  His pain is controlled with the currently prescribed interventions.    Past Medical History:  Diagnosis Date  . Gastric cancer ([place]) 08/2015   Folfox chemo tx's.  Marland Kitchen GERD (gastroesophageal reflux disease)     Past Surgical History:  Procedure Laterality Date  . ESOPHAGOGASTRODUODENOSCOPY (EGD) WITH PROPOFOL N/A 08/02/2015   Procedure: ESOPHAGOGASTRODUODENOSCOPY (EGD) WITH PROPOFOL;  Surgeon: Lucilla Lame, MD;  Location: [facility];  Service: Endoscopy;  Laterality: N/A;  . ESOPHAGOGASTRODUODENOSCOPY (EGD) WITH PROPOFOL N/A 02/26/2016   Procedure: ESOPHAGOGASTRODUODENOSCOPY (EGD) WITH PROPOFOL;  Surgeon: Lucilla Lame, MD;  Location: [facility]  ENDOSCOPY;  Service: Endoscopy;  Laterality: N/A;  . NO PAST SURGERIES    . PERIPHERAL VASCULAR CATHETERIZATION N/A 08/13/2015   Procedure: Glori Luis Cath Insertion;  Surgeon: Algernon Huxley, MD;  Location: [facility];  Service: Cardiovascular;  Laterality: N/A;    History reviewed. No pertinent family history.  Social History:  reports that he has never smoked. He has never used smokeless tobacco. He reports that he does not drink alcohol or use drugs.  The patient is from [place].  He speaks [place].  He moves to the [place] in 2000.  He lives in [place] with his mother, father, brother and sister.  He is a Theme park manager.  The patient is accompanied by his sister and the interpreter Chip Boer) today.   Allergies:  Allergies  Allergen Reactions  . Iodine     Possible anaphylaxis per MD Per conversation with pt, pt denies allergy to IV contrast.  Pt injected 09/26/16 without premeds, pt with no reaction.    Current Medications: Current Outpatient Prescriptions  Medication Sig Dispense Refill  . HYDROcodone-acetaminophen (NORCO) 7.5-325 MG tablet Take 1 tablet by mouth every 4 (four) hours as needed for moderate pain. 90 tablet 0  . hydrOXYzine (ATARAX/VISTARIL) 25 MG tablet Take 25 mg by mouth daily.    Marland Kitchen levocetirizine (XYZAL) 5 MG tablet Take 5 mg by mouth every evening.    Marland Kitchen omeprazole (PRILOSEC) 20 MG capsule Take 1 capsule (20 mg total) by mouth 2 (two) times daily before a meal. 60 capsule 2  . potassium chloride (K-DUR) 10 MEQ tablet Take 1 tablet (10 mEq total) by mouth 2 (two) times daily. Take 1 tablet daily 60 tablet  1  . prochlorperazine (COMPAZINE) 10 MG tablet Take 1 tablet (10 mg total) by mouth every 6 (six) hours as needed for nausea or vomiting. 30 tablet 1  . Diphenhyd-Hydrocort-Nystatin (FIRST-DUKES MOUTHWASH) SUSP Use as directed 10 mLs in the mouth or throat 4 (four) times daily as needed. (Patient not taking: Reported on 06/12/2016) 480 mL 3  . ferrous sulfate 325 (65  FE) MG EC tablet Take 1 tablet (325 mg total) by mouth daily with breakfast. (Patient not taking: Reported on 08/30/2016) 30 tablet 3  . gabapentin (NEURONTIN) 100 MG capsule Take 1 capsule (100 mg total) by mouth 3 (three) times daily. (Patient not taking: Reported on 06/12/2016) 90 capsule 1  . lactulose (CHRONULAC) 10 GM/15ML solution Take 30 mLs (20 g total) by mouth daily as needed for mild constipation. (Patient not taking: Reported on 08/27/2016) 120 mL 0   No current facility-administered medications for this visit.    Facility-Administered Medications Ordered in Other Visits  Medication Dose Route Frequency Provider Last Rate Last Dose  . heparin lock flush 100 unit/mL  500 Units Intravenous Once Corcoran, Melissa C, MD      . pegfilgrastim (NEULASTA) injection 6 mg  6 mg Subcutaneous Once Corcoran, Melissa C, MD      . sodium chloride 0.9 % injection 10 mL  10 mL Intravenous Once Corcoran, Melissa C, MD      . sodium chloride flush (NS) 0.9 % injection 10 mL  10 mL Intravenous PRN Lequita Asal, MD   10 mL at 08/28/15 7425    Review of Systems:  GENERAL:  Feels "ok".  No fevers or sweats.  Weight down 2 pounds. PERFORMANCE STATUS (ECOG):  1 HEENT:  No visual changes, runny nose, sore throat, mouth sores or tenderness. Lungs:  No shortness of breath or cough.  No hemoptysis. Cardiac:  No chest pain, palpitations, orthopnea, or PND. GI:    Abdominal discomfort s/p paracentesis (see HPI).  No nausea, vomiting, diarrhea, constipation, melena or hematochezia. GU:  No urgency, frequency, dysuria, or hematuria. Musculoskeletal:  No back pain.  No rib pain.  No muscle tenderness. Extremities:  No pain or swelling. Skin:  No rashes or ulcers. Neuro:  No neuropathy.  No headache, numbness or weakness, balance or coordination issues. Endocrine:  No diabetes, thyroid issues, hot flashes or night sweats. Psych:  No mood changes, depression or anxiety. Pain:  Abdominal pain when stretching  or lying on left side. Review of systems:  All other systems reviewed and found to be negative.  Physical Exam: Blood pressure 118/88, pulse 81, temperature 98.6 F (37 C), temperature source Tympanic, resp. rate 18, weight 179 lb 9 oz (81.4 kg). GENERAL:  Well developed, well nourished, gentleman sitting comfortably in the exam room in no acute distress. MENTAL STATUS:  Alert and oriented to person, place and time. HEAD:  Wearing a cap. Thin mustache.  Normocephalic, atraumatic, face symmetric, no Cushingoid features. EYES:  Brown eyes.  Pupils equal rounfd and reactive to light and accomodation.  No conjunctivitis or scleral icterus. ENT:  Oropharynx clear without lesion.  Tongue normal. Mucous membranes moist.  RESPIRATORY:  Clear to auscultation without rales, wheezes or rhonchi. CARDIOVASCULAR:  Regular rate and rhythm without murmur, rub or gallop. CHEST WALL:  Tender on palpation left anterior ribs. ABDOMEN:  Non-distended.  Soft with active bowel sounds, no hepatosplenomegaly.  No masses.  Fluid wave (slight). SKIN:  No urticaria.  No rashes or ulcers. EXTREMITIES: No edema, no skin discoloration  or tenderness.  No palpable cords. LYMPH NODES: No palpable cervical, supraclavicular, axillary or inguinal adenopathy  NEUROLOGICAL: Unremarkable. PSYCH:  Appropriate.   Imaging studies: 07/29/2015:  Chest, abdomen and pelvic CT revealed a 4 cm thickened and irregular gastric wall compatible with an infiltrative malignancy. There was extensive involvement of the upper mesentery and omentum compatible with peritoneal carcinomatosis. There was a small amount of ascites.    08/08/2015:  Head CT revealed no evidence of metastatic disease. 10/05/2015:  Abdomen and pelvic CT revealed a positive response to therapy with decreased size of the primary mass along the lesser curvature of the stomach, decrease extension of the mass into the gastrohepatic ligament, decreasing evidence of peritoneal  carcinomatosis involving predominantly the omentum, and resolution of previously noted malignant ascites. There was a potential ulcer along the greater curvature of the mid body of the stomach.   12/24/2015:  Abdomen and pelvic CT revealed further improvement in primary gastric malignancy and adjacent lymphadenopathy. There was no residual peritoneal carcinomatosis identified. 03/21/2016:  Abdominal CT revealed new small volume ascites within the abdomen extending over the liver and spleen.  There was increase soft tissue stranding throughout the peritoneal cavity worrisome for recurrence of peritoneal carcinomatosis. 04/24/2016:  Abdomen and pelvic CT revealed progressive intraperitoneal metastatic disease with increased ascites and omental caking. 06/17/2016:  Abdomen and pelvic CT revealed decreased omental metastatic disease.  The volume of ascites had decreased. 08/30/2016:  Abdomen and pelvic CT revealed increased amount of ascites compared to prior exam, as well as multiple small omental densities compared to prior exam suggesting increased peritoneal carcinomatosis. 09/04/2016:  Chest CT revealed no evidence for thoracic metastasis. 09/26/2016:  Abdomen and pelvic CT revealed extensive ascites, multiple omental metastasis (stable), wall thickening in the gastric antrum and proximal duodenum (stable) and no evidence of obstruction.   Appointment on 09/30/2016  Component Date Value Ref Range Status  . WBC 09/30/2016 7.4  3.8 - 10.6 K/uL Final  . RBC 09/30/2016 5.22  4.40 - 5.90 MIL/uL Final  . Hemoglobin 09/30/2016 14.8  13.0 - 18.0 g/dL Final  . HCT 09/30/2016 43.5  40.0 - 52.0 % Final  . MCV 09/30/2016 83.4  80.0 - 100.0 fL Final  . MCH 09/30/2016 28.4  26.0 - 34.0 pg Final  . MCHC 09/30/2016 34.0  32.0 - 36.0 g/dL Final  . RDW 09/30/2016 16.8* 11.5 - 14.5 % Final  . Platelets 09/30/2016 159  150 - 440 K/uL Final  . Neutrophils Relative % 09/30/2016 70  % Final  . Neutro Abs 09/30/2016  5.2  1.4 - 6.5 K/uL Final  . Lymphocytes Relative 09/30/2016 18  % Final  . Lymphs Abs 09/30/2016 1.4  1.0 - 3.6 K/uL Final  . Monocytes Relative 09/30/2016 7  % Final  . Monocytes Absolute 09/30/2016 0.5  0.2 - 1.0 K/uL Final  . Eosinophils Relative 09/30/2016 4  % Final  . Eosinophils Absolute 09/30/2016 0.3  0 - 0.7 K/uL Final  . Basophils Relative 09/30/2016 1  % Final  . Basophils Absolute 09/30/2016 0.1  0 - 0.1 K/uL Final  . Sodium 09/30/2016 136  135 - 145 mmol/L Final  . Potassium 09/30/2016 3.4* 3.5 - 5.1 mmol/L Final  . Chloride 09/30/2016 102  101 - 111 mmol/L Final  . CO2 09/30/2016 26  22 - 32 mmol/L Final  . Glucose, Bld 09/30/2016 149* 65 - 99 mg/dL Final  . BUN 09/30/2016 9  6 - 20 mg/dL Final  . Creatinine, Ser 09/30/2016 0.72  0.61 - 1.24 mg/dL Final  . Calcium 09/30/2016 8.5* 8.9 - 10.3 mg/dL Final  . Total Protein 09/30/2016 6.3* 6.5 - 8.1 g/dL Final  . Albumin 09/30/2016 3.2* 3.5 - 5.0 g/dL Final  . AST 09/30/2016 27  15 - 41 U/L Final  . ALT 09/30/2016 14* 17 - 63 U/L Final  . Alkaline Phosphatase 09/30/2016 107  38 - 126 U/L Final  . Total Bilirubin 09/30/2016 0.5  0.3 - 1.2 mg/dL Final  . GFR calc non Af Amer 09/30/2016 >60  >60 mL/min Final  . GFR calc Af Amer 09/30/2016 >60  >60 mL/min Final   Comment: (NOTE) The eGFR has been calculated using the CKD EPI equation. This calculation has not been validated in all clinical situations. eGFR's persistently <60 mL/min signify possible Chronic Kidney Disease.   . Anion gap 09/30/2016 8  5 - 15 Final  . Magnesium 09/30/2016 1.9  1.7 - 2.4 mg/dL Final    Assessment:  Jahaziel Francois is a 36 y.o. male with metastatic gastric cancer.  He presented with a 4-6 week history of progressive epigastric pain.  EGD on 08/02/2015 revealed a large, ulcerated, non-circumferential mass with oozing and stigmata of recent bleeding in the entire examined stomach.  The mass involved the fundus down to the antrum.  Biopsies  revealed poorly differentiated adenocarcinoma.  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  Her2/neu testing was negative.  Microsatellite testing was negative.  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  PDL-1 was expressed.  CEA was 0.3 on 07/30/2015.  Chest, abdomen and pelvic CT scan on 07/29/2015 revealed a 4 cm thickened and irregular gastric wall compatible with an infiltrative malignancy. There was extensive involvement of the upper mesentery and omentum compatible with peritoneal carcinomatosis. There was a small amount of ascites.     He completed treatment for H pylori  (began 08/09/2015).  Stool was negative for H pylori on 11/01/2015.  He has iron deficiency anemia likely gastric oozing and modest diet.  RBC are microcytic.  Labs on 08/21/2015 revealed a ferritin was 44 with an iron saturation 4% and a TIBC of 395.  Ferritin was 116 on 09/18/2015.  He received 12 cycles of FOLFOX chemotherapy (08/14/2015 - 02/05/2016).  Chemotherapy was held on 10/23/2015 secondary to thrombocytopenia (87,000).  Platelet count was 90,000 prior to cycle #11.  He has tolerated his chemotherapy well without diarrhea, mouth sores, or neuropathy.  EGD on 02/26/2016 revealed one non-bleeding gastric ulcer.   The mass appeared smaller.  Biopsy revealed moderate chronic gastritis and edema, negative for H pylori, dysplasia and malignancy.  He received 3 cycles of maintenance 5FU/LV (03/03/2016 - 04/08/2016).  He received 5 cycles of ramucirumab + Taxol (05/01/2016 - 08/20/2016).  He notes slight transient tingling in his fingertips.  Chemotherapy was held on day 15 of cycle #3 secondary to neutropenia (ANC 600).  Cycle #5 was truncated early secondary to progressive disease.  He receives Granix if WBC < 3,000 or ANC < 1500.  Abdomen and  pelvic CT on 08/30/2016 revealed increased amount of ascites compared to prior exam, as well as multiple small omental densities compared to prior exam suggesting increased peritoneal carcinomatosis. Chest CT on 09/04/2016 revealed no evidence for thoracic metastasis.  He is s/p cycle #1 pembrolizumab (09/09/2016).  He tolerated his infusion well without side effect.  He underwent paracentesis of 5.6 liters  of clear yellow fluid on 09/26/2016.  Cytology is pending.  He has a history of urticaria of unclear etiology.  He is followed by dermatology.  He takes Zyrtec every morning and hydroxyzine at night.  He takes Xyzal 5 mg daily.  He was instructed to use mild soaps and not to use perfumes.   Symptomatically, he feels "better" s/p paracentesis.  He is able to eat normally. Bowel movements regular.  Pain is controlled with Percocet prn.  Hematocrit is 43.5, hemoglobin 14.8, platelets 159,000, WBC 7,400 with an ANC of 5200.   Plan: 1.  Labs today: CBC with diff, CMP, Mg. 2.  Discuss interim paracentesis.  Discuss too early to assess response to pembrolizumab.  Discuss limited chemotherapy options (Taxotere, irinotecan) beyond clinical trial. 3.  Discuss clinical trials. Patient willing to travel locally, however he is not willing to travel outside of the state of [place].  4.  Cycle #2 pembrolizumab today. 5.  Discuss need for future paracentesis if ascites increases and patient becomes symptomatic. 6.  RTC in 3 weeks for MD assessment, labs (CBC with diff, CMP, Mg), and cycle #3 pembrolizumab.   Melissa C. Mike Gip, MD  09/30/2016, 10:22 AM

## 2016-10-03 ENCOUNTER — Telehealth: Payer: Self-pay | Admitting: *Deleted

## 2016-10-03 ENCOUNTER — Ambulatory Visit
Admission: RE | Admit: 2016-10-03 | Discharge: 2016-10-03 | Disposition: A | Discharge status: Home / Self Care | Payer: Self-pay | Source: Ambulatory Visit | Attending: Hematology and Oncology | Admitting: Hematology and Oncology

## 2016-10-03 ENCOUNTER — Other Ambulatory Visit: Payer: Self-pay | Admitting: Hematology and Oncology

## 2016-10-03 DIAGNOSIS — R180 Malignant ascites: Secondary | ICD-10-CM

## 2016-10-03 DIAGNOSIS — C786 Secondary malignant neoplasm of retroperitoneum and peritoneum: Secondary | ICD-10-CM | POA: Insufficient documentation

## 2016-10-03 LAB — BODY FLUID CELL COUNT WITH DIFFERENTIAL
Eos, Fluid: 0 %
Lymphs, Fluid: 25 %
Monocyte-Macrophage-Serous Fluid: 34 %
Neutrophil Count, Fluid: 41 %
Other Cells, Fluid: 0 %
Total Nucleated Cell Count, Fluid: 469 cu mm

## 2016-10-03 LAB — PROTEIN, PLEURAL OR PERITONEAL FLUID: Total protein, fluid: 3 g/dL

## 2016-10-03 LAB — GLUCOSE, PLEURAL OR PERITONEAL FLUID: Glucose, Fluid: 85 mg/dL

## 2016-10-03 NOTE — Telephone Encounter (Signed)
Called Anthony Melendez and explained that Anthony Melendez [place] [facility] mall [place] 230 and to not eat or drink anything else until the procedure and to bring a driver, voiced understanding.

## 2016-10-03 NOTE — Procedures (Signed)
US guided paracentesis.  Removed 4.2 liters.  Minimal blood loss and no immediate complication.

## 2016-10-03 NOTE — Telephone Encounter (Signed)
Sister called and is reporting that he is complaining of feeling "full and bloated" asking what can be done about it. Please return call [phone]

## 2016-10-03 NOTE — Telephone Encounter (Signed)
Completed see note 

## 2016-10-03 NOTE — Telephone Encounter (Signed)
Dr. Mike Gip aware. We have ordered a paracentesis. Radiology contacted and they can do it today at 1430. Patient will require an interpreter. Call patient to make him aware.

## 2016-10-03 NOTE — Discharge Instructions (Signed)
Paracentesis, cuidados posteriores  (Paracentesis, Care After)  Siga estas instrucciones durante las prximas semanas. Estas indicaciones le proporcionan informacin acerca de cmo deber cuidarse despus del procedimiento. El mdico tambin podr darle instrucciones ms especficas. El tratamiento ha sido planificado segn las prcticas mdicas actuales, pero en algunos casos pueden ocurrir problemas. Comunquese con el mdico si tiene algn problema o dudas despus del procedimiento.  QU ESPERAR DESPUS DEL PROCEDIMIENTO  Despus del procedimiento, es normal que emane una pequea cantidad de lquido transparente del lugar de la puncin.  INSTRUCCIONES PARA EL CUIDADO EN EL HOGAR   Reanude sus actividades normales como se lo haya indicado el mdico. Pregntele al mdico qu actividades son seguras para usted.   Tome los medicamentos de venta libre y los recetados solamente como se lo haya indicado el mdico.   No tome baos de inmersin, no nade ni use el jacuzzi hasta que el mdico lo autorice.   Siga las indicaciones del mdico acerca de lo siguiente:  ? Cmo cuidar el lugar de la puncin.  ? Cmo y cundo cambiar las vendas (vendaje).  ? Cundo retirar el vendaje.   Controle el sitio de la puncin todos los das para detectar signos de infeccin. Est atento a lo siguiente:  ? Dolor, hinchazn o enrojecimiento.  ? Lquido, sangre o pus.   Concurra a todas las visitas de control como se lo haya indicado el mdico. Esto es importante.  SOLICITE ATENCIN MDICA SI:   Tiene enrojecimiento, hinchazn o dolor en el lugar de la puncin.   Aumenta la cantidad de lquido transparente que emana del lugar de la puncin.   Observa que emanan sangre o pus del lugar de la puncin.   Tiene escalofros.   Tiene fiebre.  SOLICITE ATENCIN MDICA DE INMEDIATO SI:   Tiene dolor de pecho o le falta el aire.   Presenta dolor, molestias o hinchazn en el abdomen.   Tiene sensacin de mareo o de desvanecimiento, o se  desmaya.  Esta informacin no tiene como fin reemplazar el consejo del mdico. Asegrese de hacerle al mdico cualquier pregunta que tenga.  Document Released: 10/11/2014 Document Revised: 10/11/2014 Document Reviewed: 04/04/2014  Elsevier Interactive Patient Education  2018 [employer] Inc.

## 2016-10-06 LAB — BODY FLUID CULTURE: Culture: NO GROWTH

## 2016-10-07 LAB — CYTOLOGY - NON PAP

## 2016-10-13 ENCOUNTER — Other Ambulatory Visit: Payer: Self-pay | Admitting: Urgent Care

## 2016-10-13 ENCOUNTER — Telehealth: Payer: Self-pay | Admitting: *Deleted

## 2016-10-13 DIAGNOSIS — C786 Secondary malignant neoplasm of retroperitoneum and peritoneum: Secondary | ICD-10-CM

## 2016-10-13 NOTE — Telephone Encounter (Signed)
Spoke with [employer]. Paracentesis ordered. Let me know the specifics after it is scheduled.

## 2016-10-13 NOTE — Telephone Encounter (Signed)
Called to report that he is feeling bloated and full again and is asking to "have fluid drawn off" ? Paracentesis? Please advise

## 2016-10-13 NOTE — Discharge Instructions (Signed)
Paracentesis, cuidados posteriores  (Paracentesis, Care After)  Siga estas instrucciones durante las prximas semanas. Estas indicaciones le proporcionan informacin acerca de cmo deber cuidarse despus del procedimiento. El mdico tambin podr darle instrucciones ms especficas. El tratamiento ha sido planificado segn las prcticas mdicas actuales, pero en algunos casos pueden ocurrir problemas. Comunquese con el mdico si tiene algn problema o dudas despus del procedimiento.  QU ESPERAR DESPUS DEL PROCEDIMIENTO  Despus del procedimiento, es normal que emane una pequea cantidad de lquido transparente del lugar de la puncin.  INSTRUCCIONES PARA EL CUIDADO EN EL HOGAR   Reanude sus actividades normales como se lo haya indicado el mdico. Pregntele al mdico qu actividades son seguras para usted.   Tome los medicamentos de venta libre y los recetados solamente como se lo haya indicado el mdico.   No tome baos de inmersin, no nade ni use el jacuzzi hasta que el mdico lo autorice.   Siga las indicaciones del mdico acerca de lo siguiente:  ? Cmo cuidar el lugar de la puncin.  ? Cmo y cundo cambiar las vendas (vendaje).  ? Cundo retirar el vendaje.   Controle el sitio de la puncin todos los das para detectar signos de infeccin. Est atento a lo siguiente:  ? Dolor, hinchazn o enrojecimiento.  ? Lquido, sangre o pus.   Concurra a todas las visitas de control como se lo haya indicado el mdico. Esto es importante.  SOLICITE ATENCIN MDICA SI:   Tiene enrojecimiento, hinchazn o dolor en el lugar de la puncin.   Aumenta la cantidad de lquido transparente que emana del lugar de la puncin.   Observa que emanan sangre o pus del lugar de la puncin.   Tiene escalofros.   Tiene fiebre.  SOLICITE ATENCIN MDICA DE INMEDIATO SI:   Tiene dolor de pecho o le falta el aire.   Presenta dolor, molestias o hinchazn en el abdomen.   Tiene sensacin de mareo o de desvanecimiento, o se  desmaya.  Esta informacin no tiene como fin reemplazar el consejo del mdico. Asegrese de hacerle al mdico cualquier pregunta que tenga.  Document Released: 10/11/2014 Document Revised: 10/11/2014 Document Reviewed: 04/04/2014  Elsevier Interactive Patient Education  2018 [employer] Inc.

## 2016-10-14 ENCOUNTER — Ambulatory Visit
Admission: RE | Admit: 2016-10-14 | Discharge: 2016-10-14 | Disposition: A | Discharge status: Home / Self Care | Payer: 59 | Source: Ambulatory Visit | Attending: Urgent Care | Admitting: Urgent Care

## 2016-10-14 DIAGNOSIS — C786 Secondary malignant neoplasm of retroperitoneum and peritoneum: Secondary | ICD-10-CM | POA: Diagnosis not present

## 2016-10-14 DIAGNOSIS — M7989 Other specified soft tissue disorders: Secondary | ICD-10-CM | POA: Insufficient documentation

## 2016-10-14 NOTE — Procedures (Signed)
PROCEDURE SUMMARY:  Successful US guided paracentesis from [facility].  Yielded 7 L of clear yellow fluid.  No immediate complications.  Pt tolerated well.   Specimen was not sent for labs.  Ascencion Dike PA-C 10/14/2016 2:06 PM

## 2016-10-21 ENCOUNTER — Ambulatory Visit
Admission: RE | Admit: 2016-10-21 | Discharge: 2016-10-21 | Disposition: A | Discharge status: Home / Self Care | Payer: 59 | Source: Ambulatory Visit | Attending: Urgent Care | Admitting: Urgent Care

## 2016-10-21 ENCOUNTER — Encounter: Payer: Self-pay | Admitting: Hematology and Oncology

## 2016-10-21 ENCOUNTER — Inpatient Hospital Stay: Payer: 59

## 2016-10-21 ENCOUNTER — Other Ambulatory Visit: Payer: Self-pay | Admitting: Urgent Care

## 2016-10-21 ENCOUNTER — Inpatient Hospital Stay: Payer: 59 | Attending: Hematology and Oncology | Admitting: Hematology and Oncology

## 2016-10-21 VITALS — BP 122/88 | HR 103 | Temp 98.0°F | Wt 172.3 lb

## 2016-10-21 DIAGNOSIS — R51 Headache: Secondary | ICD-10-CM

## 2016-10-21 DIAGNOSIS — C786 Secondary malignant neoplasm of retroperitoneum and peritoneum: Secondary | ICD-10-CM | POA: Diagnosis present

## 2016-10-21 DIAGNOSIS — R180 Malignant ascites: Secondary | ICD-10-CM | POA: Insufficient documentation

## 2016-10-21 DIAGNOSIS — R188 Other ascites: Secondary | ICD-10-CM

## 2016-10-21 DIAGNOSIS — K219 Gastro-esophageal reflux disease without esophagitis: Secondary | ICD-10-CM | POA: Insufficient documentation

## 2016-10-21 DIAGNOSIS — Z79899 Other long term (current) drug therapy: Secondary | ICD-10-CM | POA: Diagnosis not present

## 2016-10-21 DIAGNOSIS — L509 Urticaria, unspecified: Secondary | ICD-10-CM | POA: Insufficient documentation

## 2016-10-21 DIAGNOSIS — Z5112 Encounter for antineoplastic immunotherapy: Secondary | ICD-10-CM

## 2016-10-21 DIAGNOSIS — Z85028 Personal history of other malignant neoplasm of stomach: Secondary | ICD-10-CM | POA: Diagnosis not present

## 2016-10-21 DIAGNOSIS — C642 Malignant neoplasm of left kidney, except renal pelvis: Secondary | ICD-10-CM | POA: Diagnosis present

## 2016-10-21 DIAGNOSIS — R634 Abnormal weight loss: Secondary | ICD-10-CM | POA: Diagnosis not present

## 2016-10-21 DIAGNOSIS — C169 Malignant neoplasm of stomach, unspecified: Secondary | ICD-10-CM | POA: Diagnosis present

## 2016-10-21 DIAGNOSIS — G893 Neoplasm related pain (acute) (chronic): Secondary | ICD-10-CM | POA: Diagnosis not present

## 2016-10-21 DIAGNOSIS — C162 Malignant neoplasm of body of stomach: Secondary | ICD-10-CM

## 2016-10-21 DIAGNOSIS — C801 Malignant (primary) neoplasm, unspecified: Secondary | ICD-10-CM

## 2016-10-21 DIAGNOSIS — M791 Myalgia: Secondary | ICD-10-CM | POA: Diagnosis not present

## 2016-10-21 DIAGNOSIS — D509 Iron deficiency anemia, unspecified: Secondary | ICD-10-CM | POA: Insufficient documentation

## 2016-10-21 DIAGNOSIS — Z7189 Other specified counseling: Secondary | ICD-10-CM

## 2016-10-21 DIAGNOSIS — Z8719 Personal history of other diseases of the digestive system: Secondary | ICD-10-CM | POA: Diagnosis not present

## 2016-10-21 DIAGNOSIS — Z1509 Genetic susceptibility to other malignant neoplasm: Secondary | ICD-10-CM | POA: Insufficient documentation

## 2016-10-21 LAB — CBC WITH DIFFERENTIAL/PLATELET
Basophils Absolute: 0.1 10*3/uL (ref 0–0.1)
Basophils Relative: 1 %
Eosinophils Absolute: 0.1 10*3/uL (ref 0–0.7)
Eosinophils Relative: 1 %
HCT: 39.9 % — ABNORMAL LOW (ref 40.0–52.0)
Hemoglobin: 13.8 g/dL (ref 13.0–18.0)
Lymphocytes Relative: 12 %
Lymphs Abs: 1 10*3/uL (ref 1.0–3.6)
MCH: 27.4 pg (ref 26.0–34.0)
MCHC: 34.6 g/dL (ref 32.0–36.0)
MCV: 79.4 fL — ABNORMAL LOW (ref 80.0–100.0)
Monocytes Absolute: 0.6 10*3/uL (ref 0.2–1.0)
Monocytes Relative: 8 %
Neutro Abs: 6.1 10*3/uL (ref 1.4–6.5)
Neutrophils Relative %: 78 %
Platelets: 329 10*3/uL (ref 150–440)
RBC: 5.03 MIL/uL (ref 4.40–5.90)
RDW: 15.4 % — ABNORMAL HIGH (ref 11.5–14.5)
WBC: 7.9 10*3/uL (ref 3.8–10.6)

## 2016-10-21 LAB — COMPREHENSIVE METABOLIC PANEL
ALT: 14 U/L — ABNORMAL LOW (ref 17–63)
AST: 23 U/L (ref 15–41)
Albumin: 2.5 g/dL — ABNORMAL LOW (ref 3.5–5.0)
Alkaline Phosphatase: 210 U/L — ABNORMAL HIGH (ref 38–126)
Anion gap: 9 (ref 5–15)
BUN: 13 mg/dL (ref 6–20)
CO2: 26 mmol/L (ref 22–32)
Calcium: 8.6 mg/dL — ABNORMAL LOW (ref 8.9–10.3)
Chloride: 101 mmol/L (ref 101–111)
Creatinine, Ser: 0.72 mg/dL (ref 0.61–1.24)
GFR calc Af Amer: >60 mL/min (ref >60)
GFR calc non Af Amer: >60 mL/min (ref >60)
Glucose, Bld: 114 mg/dL — ABNORMAL HIGH (ref 65–99)
Potassium: 3.6 mmol/L (ref 3.5–5.1)
Sodium: 136 mmol/L (ref 135–145)
Total Bilirubin: 0.7 mg/dL (ref 0.3–1.2)
Total Protein: 6.4 g/dL — ABNORMAL LOW (ref 6.5–8.1)

## 2016-10-21 LAB — FERRITIN: FERRITIN: 181 ng/mL (ref 24–336)

## 2016-10-21 LAB — T4, FREE: Free T4: 1.19 ng/dL — ABNORMAL HIGH (ref 0.61–1.12)

## 2016-10-21 LAB — MAGNESIUM: Magnesium: 1.9 mg/dL (ref 1.7–2.4)

## 2016-10-21 LAB — TSH: TSH: 5.047 u[IU]/mL — ABNORMAL HIGH (ref 0.350–4.500)

## 2016-10-21 MED ORDER — HEPARIN SOD (PORK) LOCK FLUSH 100 UNIT/ML IV SOLN
500.0000 [IU] | Freq: Once | INTRAVENOUS | Status: AC
  Administered 2016-10-21: 500 [IU] via INTRAVENOUS
  Filled 2016-10-21: qty 5

## 2016-10-21 MED ORDER — SODIUM CHLORIDE FLUSH 0.9 % IV SOLN
INTRAVENOUS | Status: AC
  Filled 2016-10-21: qty 10

## 2016-10-21 MED ORDER — HEPARIN SOD (PORK) LOCK FLUSH 100 UNIT/ML IV SOLN
INTRAVENOUS | Status: AC
  Administered 2016-10-21: 500 [IU] via INTRAVENOUS
  Filled 2016-10-21: qty 5

## 2016-10-21 MED ORDER — ALBUMIN HUMAN 25 % IV SOLN
12.5000 g | Freq: Once | INTRAVENOUS | Status: AC
  Filled 2016-10-21: qty 50

## 2016-10-21 MED ORDER — ALBUMIN HUMAN 25 % IV SOLN
12.5000 g | Freq: Once | INTRAVENOUS | Status: AC
Start: 2016-10-21 — End: 2016-10-21
  Administered 2016-10-21: 12.5 g via INTRAVENOUS
  Filled 2016-10-21: qty 50

## 2016-10-21 MED ORDER — ALBUMIN HUMAN 5 % IV SOLN: 12.5000 g | Freq: Once | INTRAVENOUS | Status: DC

## 2016-10-21 NOTE — Progress Notes (Signed)
[facility] day:  10/21/16  Chief Complaint: Anthony Melendez is a 36 y.o. male with metastatic gastric cancer who is seen for assessment prior to cycle #3 pembrolizumab.  HPI:  The patient was last seen in the medical oncology clinic on 09/30/2016.  At that time, he was feeling better post paracentesis on 09/26/2016.  Pain was well controlled.  He received cycle #2 pembrolizumab.  He contacted the clinic on 10/13/2016 feeling bloated and full.  He underwent paracentesis of 7 liters on 10/14/2016.  Symptomatically, he notes diffuse abdominal pain. Pain is rated 3/10 today in the clinic.  Patient felt better following paracentesis on 10/14/2016, however he states, "It builds back up quick".   He generally appreciates symptoms, mainly pain, 6 days after having a paracentesis.  Patient reports symptoms of vertigo following his procedure.  He has been experiencing "a lot of body aches and headaches".  He denies any nausea, vomiting, and diarrhea.  He has lost 7 pounds since last visit.   Past Medical History:  Diagnosis Date  . Gastric cancer ([place]) 08/2015   Folfox chemo tx's.  Marland Kitchen GERD (gastroesophageal reflux disease)     Past Surgical History:  Procedure Laterality Date  . ESOPHAGOGASTRODUODENOSCOPY (EGD) WITH PROPOFOL N/A 08/02/2015   Procedure: ESOPHAGOGASTRODUODENOSCOPY (EGD) WITH PROPOFOL;  Surgeon: Lucilla Lame, MD;  Location: [facility];  Service: Endoscopy;  Laterality: N/A;  . ESOPHAGOGASTRODUODENOSCOPY (EGD) WITH PROPOFOL N/A 02/26/2016   Procedure: ESOPHAGOGASTRODUODENOSCOPY (EGD) WITH PROPOFOL;  Surgeon: Lucilla Lame, MD;  Location: [facility];  Service: Endoscopy;  Laterality: N/A;  . NO PAST SURGERIES    . PERIPHERAL VASCULAR CATHETERIZATION N/A 08/13/2015   Procedure: Glori Luis Cath Insertion;  Surgeon: Algernon Huxley, MD;  Location: [facility];  Service: Cardiovascular;  Laterality: N/A;    History reviewed. No  pertinent family history.  Social History:  reports that he has never smoked. He has never used smokeless tobacco. He reports that he does not drink alcohol or use drugs.  The patient is from [place].  He speaks [place].  He moves to the [place] in 2000.  He lives in [place] with his mother, father, brother and sister.  He is a Theme park manager.  The patient is accompanied by his mother, sister, and the interpreter Chip Boer) today.   Allergies:  Allergies  Allergen Reactions  . Iodine     Possible anaphylaxis per MD Per conversation with pt, pt denies allergy to IV contrast.  Pt injected 09/26/16 without premeds, pt with no reaction.    Current Medications: Current Outpatient Prescriptions  Medication Sig Dispense Refill  . Diphenhyd-Hydrocort-Nystatin (FIRST-DUKES MOUTHWASH) SUSP Use as directed 10 mLs in the mouth or throat 4 (four) times daily as needed. 480 mL 3  . ferrous sulfate 325 (65 FE) MG EC tablet Take 1 tablet (325 mg total) by mouth daily with breakfast. 30 tablet 3  . gabapentin (NEURONTIN) 100 MG capsule Take 1 capsule (100 mg total) by mouth 3 (three) times daily. 90 capsule 1  . HYDROcodone-acetaminophen (NORCO) 7.5-325 MG tablet Take 1 tablet by mouth every 4 (four) hours as needed for moderate pain. 90 tablet 0  . hydrOXYzine (ATARAX/VISTARIL) 25 MG tablet Take 25 mg by mouth daily.    Marland Kitchen lactulose (CHRONULAC) 10 GM/15ML solution Take 30 mLs (20 g total) by mouth daily as needed for mild constipation. 120 mL 0  . levocetirizine (XYZAL) 5 MG tablet Take 5 mg by mouth every evening.    Marland Kitchen  omeprazole (PRILOSEC) 20 MG capsule Take 1 capsule (20 mg total) by mouth 2 (two) times daily before a meal. 60 capsule 2  . potassium chloride (K-DUR) 10 MEQ tablet Take 1 tablet (10 mEq total) by mouth 2 (two) times daily. Take 1 tablet daily 60 tablet 1  . prochlorperazine (COMPAZINE) 10 MG tablet Take 1 tablet (10 mg total) by mouth every 6 (six) hours as needed for nausea or vomiting. 30  tablet 1   No current facility-administered medications for this visit.    Facility-Administered Medications Ordered in Other Visits  Medication Dose Route Frequency Provider Last Rate Last Dose  . heparin lock flush 100 unit/mL  500 Units Intravenous Once Corcoran, Melissa C, MD      . pegfilgrastim (NEULASTA) injection 6 mg  6 mg Subcutaneous Once Corcoran, Melissa C, MD      . sodium chloride 0.9 % injection 10 mL  10 mL Intravenous Once Corcoran, Melissa C, MD      . sodium chloride flush (NS) 0.9 % injection 10 mL  10 mL Intravenous PRN Lequita Asal, MD   10 mL at 08/28/15 4098    Review of Systems:  GENERAL:  Feels "ok".  No fevers or sweats.  Weight down 7 pounds. PERFORMANCE STATUS (ECOG):  1 HEENT:  No visual changes, runny nose, sore throat, mouth sores or tenderness. Lungs:  No shortness of breath or cough.  No hemoptysis. Cardiac:  No chest pain, palpitations, orthopnea, or PND. GI:    Abdominal discomfort prior to paracentesis (see HPI).  Decreased oral intake secondary to ascites.  No nausea, vomiting, diarrhea, constipation, melena or hematochezia. GU:  No urgency, frequency, dysuria, or hematuria. Musculoskeletal:  No back pain.  No rib pain.  No muscle tenderness. Extremities:  No pain or swelling. Skin:  No rashes or ulcers. Neuro:  No neuropathy.  No headache, numbness or weakness, balance or coordination issues. Endocrine:  No diabetes, thyroid issues, hot flashes or night sweats. Psych:  No mood changes, depression or anxiety. Pain:  Abdominal discomfort (9 out of 10). Review of systems:  All other systems reviewed and found to be negative.  Physical Exam: Blood pressure 122/88, pulse (!) 103, temperature 98 F (36.7 C), temperature source Tympanic, weight 172 lb 4.8 oz (78.2 kg). GENERAL:  Well developed, well nourished, gentleman sitting comfortably in the exam room in no acute distress. MENTAL STATUS:  Alert and oriented to person, place and  time. HEAD:  Wearing a cap.  Scruffy facial hair.  Normocephalic, atraumatic, face thinning and symmetric, no Cushingoid features. EYES:  Brown eyes.  Pupils equal rounfd and reactive to light and accomodation.  No conjunctivitis or scleral icterus. ENT:  Oropharynx clear without lesion.  Tongue normal. Mucous membranes moist.  RESPIRATORY:  Clear to auscultation without rales, wheezes or rhonchi. CARDIOVASCULAR:  Regular rate and rhythm without murmur, rub or gallop. ABDOMEN:  Slightly distended.  Soft with active bowel sounds, no hepatosplenomegaly.  No masses.  Fluid wave. SKIN:  No urticaria.  No rashes or ulcers. EXTREMITIES: No edema, no skin discoloration or tenderness.  No palpable cords. LYMPH NODES: No palpable cervical, supraclavicular, axillary or inguinal adenopathy  NEUROLOGICAL: Unremarkable. PSYCH:  Appropriate.   Imaging studies: 07/29/2015:  Chest, abdomen and pelvic CT revealed a 4 cm thickened and irregular gastric wall compatible with an infiltrative malignancy. There was extensive involvement of the upper mesentery and omentum compatible with peritoneal carcinomatosis. There was a small amount of ascites.    08/08/2015:  Head CT revealed no evidence of metastatic disease. 10/05/2015:  Abdomen and pelvic CT revealed a positive response to therapy with decreased size of the primary mass along the lesser curvature of the stomach, decrease extension of the mass into the gastrohepatic ligament, decreasing evidence of peritoneal carcinomatosis involving predominantly the omentum, and resolution of previously noted malignant ascites. There was a potential ulcer along the greater curvature of the mid body of the stomach.   12/24/2015:  Abdomen and pelvic CT revealed further improvement in primary gastric malignancy and adjacent lymphadenopathy. There was no residual peritoneal carcinomatosis identified. 03/21/2016:  Abdominal CT revealed new small volume ascites within the abdomen  extending over the liver and spleen.  There was increase soft tissue stranding throughout the peritoneal cavity worrisome for recurrence of peritoneal carcinomatosis. 04/24/2016:  Abdomen and pelvic CT revealed progressive intraperitoneal metastatic disease with increased ascites and omental caking. 06/17/2016:  Abdomen and pelvic CT revealed decreased omental metastatic disease.  The volume of ascites had decreased. 08/30/2016:  Abdomen and pelvic CT revealed increased amount of ascites compared to prior exam, as well as multiple small omental densities compared to prior exam suggesting increased peritoneal carcinomatosis. 09/04/2016:  Chest CT revealed no evidence for thoracic metastasis. 09/26/2016:  Abdomen and pelvic CT revealed extensive ascites, multiple omental metastasis (stable), wall thickening in the gastric antrum and proximal duodenum (stable) and no evidence of obstruction.   Appointment on 10/21/2016  Component Date Value Ref Range Status  . WBC 10/21/2016 7.9  3.8 - 10.6 K/uL Final  . RBC 10/21/2016 5.03  4.40 - 5.90 MIL/uL Final  . Hemoglobin 10/21/2016 13.8  13.0 - 18.0 g/dL Final  . HCT 73/67/4019 39.9* 40.0 - 52.0 % Final  . MCV 10/21/2016 79.4* 80.0 - 100.0 fL Final  . MCH 10/21/2016 27.4  26.0 - 34.0 pg Final  . MCHC 10/21/2016 34.6  32.0 - 36.0 g/dL Final  . RDW 18/87/5567 15.4* 11.5 - 14.5 % Final  . Platelets 10/21/2016 329  150 - 440 K/uL Final  . Neutrophils Relative % 10/21/2016 78  % Final  . Neutro Abs 10/21/2016 6.1  1.4 - 6.5 K/uL Final  . Lymphocytes Relative 10/21/2016 12  % Final  . Lymphs Abs 10/21/2016 1.0  1.0 - 3.6 K/uL Final  . Monocytes Relative 10/21/2016 8  % Final  . Monocytes Absolute 10/21/2016 0.6  0.2 - 1.0 K/uL Final  . Eosinophils Relative 10/21/2016 1  % Final  . Eosinophils Absolute 10/21/2016 0.1  0 - 0.7 K/uL Final  . Basophils Relative 10/21/2016 1  % Final  . Basophils Absolute 10/21/2016 0.1  0 - 0.1 K/uL Final  . Sodium  10/21/2016 136  135 - 145 mmol/L Final  . Potassium 10/21/2016 3.6  3.5 - 5.1 mmol/L Final  . Chloride 10/21/2016 101  101 - 111 mmol/L Final  . CO2 10/21/2016 26  22 - 32 mmol/L Final  . Glucose, Bld 10/21/2016 114* 65 - 99 mg/dL Final  . BUN 61/59/1847 13  6 - 20 mg/dL Final  . Creatinine, Ser 10/21/2016 0.72  0.61 - 1.24 mg/dL Final  . Calcium 49/90/4136 8.6* 8.9 - 10.3 mg/dL Final  . Total Protein 10/21/2016 6.4* 6.5 - 8.1 g/dL Final  . Albumin 58/38/4123 2.5* 3.5 - 5.0 g/dL Final  . AST 10/17/1981 23  15 - 41 U/L Final  . ALT 10/21/2016 14* 17 - 63 U/L Final  . Alkaline Phosphatase 10/21/2016 210* 38 - 126 U/L Final  . Total Bilirubin 10/21/2016  0.7  0.3 - 1.2 mg/dL Final  . GFR calc non Af Amer 10/21/2016 >60  >60 mL/min Final  . GFR calc Af Amer 10/21/2016 >60  >60 mL/min Final   Comment: (NOTE) The eGFR has been calculated using the CKD EPI equation. This calculation has not been validated in all clinical situations. eGFR's persistently <60 mL/min signify possible Chronic Kidney Disease.   . Anion gap 10/21/2016 9  5 - 15 Final  . Magnesium 10/21/2016 1.9  1.7 - 2.4 mg/dL Final    Assessment:  Anthony Melendez is a 36 y.o. male with metastatic gastric cancer.  He presented with a 4-6 week history of progressive epigastric pain.  EGD on 08/02/2015 revealed a large, ulcerated, non-circumferential mass with oozing and stigmata of recent bleeding in the entire examined stomach.  The mass involved the fundus down to the antrum.  Biopsies revealed poorly differentiated adenocarcinoma.  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  Her2/neu testing was negative.  Microsatellite testing was negative.  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  PDL-1 was expressed.  CEA was 0.3 on 07/30/2015.  Chest, abdomen and  pelvic CT scan on 07/29/2015 revealed a 4 cm thickened and irregular gastric wall compatible with an infiltrative malignancy. There was extensive involvement of the upper mesentery and omentum compatible with peritoneal carcinomatosis. There was a small amount of ascites.     He completed treatment for H pylori  (began 08/09/2015).  Stool was negative for H pylori on 11/01/2015.  He has iron deficiency anemia likely gastric oozing and modest diet.  RBC are microcytic.  Labs on 08/21/2015 revealed a ferritin was 44 with an iron saturation 4% and a TIBC of 395.  Ferritin was 116 on 09/18/2015.  He received 12 cycles of FOLFOX chemotherapy (08/14/2015 - 02/05/2016).  Chemotherapy was held on 10/23/2015 secondary to thrombocytopenia (87,000).  Platelet count was 90,000 prior to cycle #11.  He has tolerated his chemotherapy well without diarrhea, mouth sores, or neuropathy.  EGD on 02/26/2016 revealed one non-bleeding gastric ulcer.   The mass appeared smaller.  Biopsy revealed moderate chronic gastritis and edema, negative for H pylori, dysplasia and malignancy.  He received 3 cycles of maintenance 5FU/LV (03/03/2016 - 04/08/2016).  He received 5 cycles of ramucirumab + Taxol (05/01/2016 - 08/20/2016).  He notes slight transient tingling in his fingertips.  Chemotherapy was held on day 15 of cycle #3 secondary to neutropenia (ANC 600).  Cycle #5 was truncated early secondary to progressive disease.  He receives Granix if WBC < 3,000 or ANC < 1500.  Abdomen and pelvic CT on 08/30/2016 revealed increased amount of ascites compared to prior exam, as well as multiple small omental densities compared to prior exam suggesting increased peritoneal carcinomatosis. Chest CT on 09/04/2016 revealed no evidence for thoracic metastasis.  He received 2 cycles of pembrolizumab (09/09/2016 - 09/30/2016).  He tolerated his infusion well without side effect.  He has undergone paracentesis x 3 (5.6 liters on 09/26/2016,  4.2 liters on 10/03/2016, and 7 liters on 10/14/2016).  Cytology on 10/03/2016 revealed few scattered atypical cells c/w adenocarcinoma.  He has a history of urticaria of unclear etiology.  He is followed by dermatology.  He takes Zyrtec every morning and hydroxyzine at night.  He takes Xyzal 5 mg daily.  He was instructed to use mild soaps and not to use perfumes.  Symptomatically, he has diffuse abdominal discomfort secondary to ascites.  He is able to eat normally when his abdomen is not distended. Pain is controlled with Percocet prn.  Hematocrit is 39.9, hemoglobin 13.8, platelets 329,000, WBC 7,900 with an ANC of 6100.   Plan: 1.  Labs today: CBC with diff, CMP, Mg, TSH, free T4 2.  Discuss interim paracentesis.  Discuss response to lack of response to pembrolizumab with need for recurrent high volume paracentesis.  Discuss limited chemotherapy options (Taxotere, irinotecan, FOLFIRI) beyond clinical trial. Discuss side effect profile associated with an irinotecan based chemotherapy regimen diarrhea, alopecia, and myelosuppression.  3.  Discuss clinical trials. Patient willing to travel locally, however he is not willing to travel outside of the state of [place].  Dr. Mike Gip has spoken with Dr. Lauree Chandler at [facility]. [facility] to contact patient to set up appointment.  4.  Discuss advanced directives today with patient and family. Advanced directives packet provided. Patient to review and we will discuss at next RTC. 5.  Discontinue pembrolizumab today. 6.  Preauth FOLFIRI 7.  STAT paracentesis today. He will receive concurrent Albumin 25% 12.5 gm.  8.  Schedule weekly paracentesis x 4. 9.  RTC on 10/31/2016 for MD assessment and finalization of treatment (clinical trial or FOLFIRI).   Over 40 minutes were spent with the patient and his family discussing current status of disease and limited treatment options.  Discuss decision to discontinue pembrolizumab.  Discuss clinical trial enrollment.   Eligibility unclear about specific trials (to be discussed with Dr. Aleatha Borer).  Code status issues discussed at length. Numerous questions were asked and answered.   Melissa C. Mike Gip, MD  10/21/2016, 10:43 AM

## 2016-10-21 NOTE — Discharge Instructions (Signed)
Paracentesis, cuidados posteriores  (Paracentesis, Care After)  Siga estas instrucciones durante las prximas semanas. Estas indicaciones le proporcionan informacin acerca de cmo deber cuidarse despus del procedimiento. El mdico tambin podr darle instrucciones ms especficas. El tratamiento ha sido planificado segn las prcticas mdicas actuales, pero en algunos casos pueden ocurrir problemas. Comunquese con el mdico si tiene algn problema o dudas despus del procedimiento.  QU ESPERAR DESPUS DEL PROCEDIMIENTO  Despus del procedimiento, es normal que emane una pequea cantidad de lquido transparente del lugar de la puncin.  INSTRUCCIONES PARA EL CUIDADO EN EL HOGAR   Reanude sus actividades normales como se lo haya indicado el mdico. Pregntele al mdico qu actividades son seguras para usted.   Tome los medicamentos de venta libre y los recetados solamente como se lo haya indicado el mdico.   No tome baos de inmersin, no nade ni use el jacuzzi hasta que el mdico lo autorice.   Siga las indicaciones del mdico acerca de lo siguiente:  ? Cmo cuidar el lugar de la puncin.  ? Cmo y cundo cambiar las vendas (vendaje).  ? Cundo retirar el vendaje.   Controle el sitio de la puncin todos los das para detectar signos de infeccin. Est atento a lo siguiente:  ? Dolor, hinchazn o enrojecimiento.  ? Lquido, sangre o pus.   Concurra a todas las visitas de control como se lo haya indicado el mdico. Esto es importante.  SOLICITE ATENCIN MDICA SI:   Tiene enrojecimiento, hinchazn o dolor en el lugar de la puncin.   Aumenta la cantidad de lquido transparente que emana del lugar de la puncin.   Observa que emanan sangre o pus del lugar de la puncin.   Tiene escalofros.   Tiene fiebre.  SOLICITE ATENCIN MDICA DE INMEDIATO SI:   Tiene dolor de pecho o le falta el aire.   Presenta dolor, molestias o hinchazn en el abdomen.   Tiene sensacin de mareo o de desvanecimiento, o se  desmaya.  Esta informacin no tiene como fin reemplazar el consejo del mdico. Asegrese de hacerle al mdico cualquier pregunta que tenga.  Document Released: 10/11/2014 Document Revised: 10/11/2014 Document Reviewed: 04/04/2014  Elsevier Interactive Patient Education  2018 [employer] Inc.

## 2016-10-21 NOTE — Progress Notes (Signed)
Patient here for follow up. He has complaints of stomach pain that is sometimes sever when he moves, feelings of fullness affects his ability to eat, he had several bouts of vomiting 3 days ago, He has headaches and total body pain at times. He has temperature imbalance.

## 2016-10-21 NOTE — Procedures (Signed)
US paracentesis without difficulty  Complications:  None  Blood Loss: none  See dictation in canopy pacs  

## 2016-10-22 ENCOUNTER — Other Ambulatory Visit: Payer: Self-pay | Admitting: Urgent Care

## 2016-10-22 DIAGNOSIS — R188 Other ascites: Secondary | ICD-10-CM

## 2016-10-23 ENCOUNTER — Other Ambulatory Visit: Payer: Self-pay | Admitting: Urgent Care

## 2016-10-23 DIAGNOSIS — C162 Malignant neoplasm of body of stomach: Secondary | ICD-10-CM

## 2016-10-23 DIAGNOSIS — R188 Other ascites: Secondary | ICD-10-CM

## 2016-10-23 DIAGNOSIS — R180 Malignant ascites: Secondary | ICD-10-CM

## 2016-10-23 DIAGNOSIS — C801 Malignant (primary) neoplasm, unspecified: Secondary | ICD-10-CM

## 2016-10-23 DIAGNOSIS — C786 Secondary malignant neoplasm of retroperitoneum and peritoneum: Secondary | ICD-10-CM

## 2016-10-23 MED ORDER — ALBUMIN HUMAN 25 % IV SOLN: 12.5000 g | Freq: Once | INTRAVENOUS | Status: DC

## 2016-10-24 NOTE — Progress Notes (Signed)
Albumin 25% 12.5gm ordered by NP to be given for paracentesis on 9/25, 10/2, 10/9, and 10/16. [facility] Procedures was called to make sure the orders are entered correctly for them to release. Specials is aware of the patient, and the nurse stated she sees standing orders for the albumin available for her to release.

## 2016-10-27 ENCOUNTER — Other Ambulatory Visit: Payer: Self-pay | Admitting: Urgent Care

## 2016-10-27 DIAGNOSIS — R188 Other ascites: Secondary | ICD-10-CM

## 2016-10-28 ENCOUNTER — Ambulatory Visit: Payer: Self-pay

## 2016-10-28 ENCOUNTER — Ambulatory Visit
Admission: RE | Admit: 2016-10-28 | Discharge: 2016-10-28 | Disposition: A | Discharge status: Home / Self Care | Payer: 59 | Source: Ambulatory Visit | Attending: Urgent Care | Admitting: Urgent Care

## 2016-10-28 ENCOUNTER — Other Ambulatory Visit: Payer: Self-pay | Admitting: Urgent Care

## 2016-10-28 ENCOUNTER — Other Ambulatory Visit: Payer: Self-pay | Admitting: *Deleted

## 2016-10-28 ENCOUNTER — Ambulatory Visit: Admission: RE | Admit: 2016-10-28 | Payer: Self-pay | Source: Ambulatory Visit

## 2016-10-28 ENCOUNTER — Ambulatory Visit (HOSPITAL_COMMUNITY): Payer: Self-pay

## 2016-10-28 DIAGNOSIS — R188 Other ascites: Secondary | ICD-10-CM | POA: Diagnosis not present

## 2016-10-28 MED ORDER — HYDROCODONE-ACETAMINOPHEN 7.5-325 MG PO TABS: 1.0000 | ORAL_TABLET | ORAL | 0 refills | Status: DC | PRN

## 2016-10-28 MED ORDER — HYDROCODONE-ACETAMINOPHEN 7.5-325 MG PO TABS
1.0000 | ORAL_TABLET | ORAL | 0 refills | Status: DC | PRN
Start: 2016-10-28 — End: 2016-11-20

## 2016-10-31 ENCOUNTER — Inpatient Hospital Stay (HOSPITAL_BASED_OUTPATIENT_CLINIC_OR_DEPARTMENT_OTHER): Payer: 59 | Admitting: Hematology and Oncology

## 2016-10-31 ENCOUNTER — Encounter: Payer: Self-pay | Admitting: Hematology and Oncology

## 2016-10-31 ENCOUNTER — Other Ambulatory Visit: Payer: Self-pay | Admitting: Hematology and Oncology

## 2016-10-31 VITALS — BP 97/69 | HR 92 | Temp 98.6°F | Resp 18 | Wt 170.4 lb

## 2016-10-31 DIAGNOSIS — C162 Malignant neoplasm of body of stomach: Secondary | ICD-10-CM

## 2016-10-31 DIAGNOSIS — Z7189 Other specified counseling: Secondary | ICD-10-CM

## 2016-10-31 DIAGNOSIS — G893 Neoplasm related pain (acute) (chronic): Secondary | ICD-10-CM

## 2016-10-31 DIAGNOSIS — C786 Secondary malignant neoplasm of retroperitoneum and peritoneum: Secondary | ICD-10-CM

## 2016-10-31 DIAGNOSIS — R180 Malignant ascites: Secondary | ICD-10-CM

## 2016-10-31 DIAGNOSIS — C169 Malignant neoplasm of stomach, unspecified: Secondary | ICD-10-CM

## 2016-10-31 DIAGNOSIS — Z85028 Personal history of other malignant neoplasm of stomach: Secondary | ICD-10-CM

## 2016-10-31 DIAGNOSIS — R634 Abnormal weight loss: Secondary | ICD-10-CM

## 2016-10-31 DIAGNOSIS — Z1509 Genetic susceptibility to other malignant neoplasm: Secondary | ICD-10-CM

## 2016-10-31 DIAGNOSIS — L509 Urticaria, unspecified: Secondary | ICD-10-CM

## 2016-10-31 DIAGNOSIS — D509 Iron deficiency anemia, unspecified: Secondary | ICD-10-CM

## 2016-10-31 DIAGNOSIS — R51 Headache: Secondary | ICD-10-CM

## 2016-10-31 DIAGNOSIS — Z79899 Other long term (current) drug therapy: Secondary | ICD-10-CM

## 2016-10-31 DIAGNOSIS — Z8719 Personal history of other diseases of the digestive system: Secondary | ICD-10-CM

## 2016-10-31 DIAGNOSIS — C801 Malignant (primary) neoplasm, unspecified: Secondary | ICD-10-CM

## 2016-10-31 DIAGNOSIS — M791 Myalgia: Secondary | ICD-10-CM

## 2016-10-31 DIAGNOSIS — K219 Gastro-esophageal reflux disease without esophagitis: Secondary | ICD-10-CM

## 2016-10-31 DIAGNOSIS — R188 Other ascites: Secondary | ICD-10-CM

## 2016-10-31 NOTE — Progress Notes (Signed)
Patient states a couple of days ago he experienced nausea.  Gets SOB when he has back pain.  Appetite 25%.  No supplements - does not like taste.  Otherwise, no complaints.

## 2016-10-31 NOTE — Progress Notes (Signed)
[facility] day:  10/31/16  Chief Complaint: Saabir Blyth is a 36 y.o. male with metastatic gastric cancer who is seen for 2 week assessment.  HPI:  The patient was last seen in the medical oncology clinic on 10/21/2016.  At that time, he had diffuse abdominal pain.  Pain was controlled with Percocet prn.  We discussed lack of response to pembrolizumab with need for recurrent high volume paracentesis.  Pembrolizumab was discontinued.  We discussed scheduling regular paracentesis.  We discussed chemotherapy options versus clinical trial enrollment.  He underwent paracentesis of 4.8 L on 10/21/2016.  Limited abdominal ultrasound on 10/28/2016 revealed no large sizable pocket for safe paracentesis.  He was seen by Dr. Lauree Chandler at [facility] on 10/30/2016. Patient has a "scan" scheduled on 11/05/2016 at [facility]. He has an eye exam and EKG scheduled on 10/08 and 10/09. Patient is being enrolled in a clinic trial that includes "shots".  Symptomatically, he is doing well. He is not having significant pain in his abdomen today; pain rated 0/10. He notes that he does not feel like the fluid is accumulating in his abdomen.  He is eating well. He has lost 2 pounds since last visit. Patient's bowels are moving regularly.    Past Medical History:  Diagnosis Date  . Gastric cancer ([place]) 08/2015   Folfox chemo tx's.  Marland Kitchen GERD (gastroesophageal reflux disease)     Past Surgical History:  Procedure Laterality Date  . ESOPHAGOGASTRODUODENOSCOPY (EGD) WITH PROPOFOL N/A 08/02/2015   Procedure: ESOPHAGOGASTRODUODENOSCOPY (EGD) WITH PROPOFOL;  Surgeon: Lucilla Lame, MD;  Location: [place];  Service: Endoscopy;  Laterality: N/A;  . ESOPHAGOGASTRODUODENOSCOPY (EGD) WITH PROPOFOL N/A 02/26/2016   Procedure: ESOPHAGOGASTRODUODENOSCOPY (EGD) WITH PROPOFOL;  Surgeon: Lucilla Lame, MD;  Location: [facility];  Service: Endoscopy;  Laterality: N/A;  . NO PAST  SURGERIES    . PERIPHERAL VASCULAR CATHETERIZATION N/A 08/13/2015   Procedure: Glori Luis Cath Insertion;  Surgeon: Algernon Huxley, MD;  Location: [place] [facility];  Service: Cardiovascular;  Laterality: N/A;    History reviewed. No pertinent family history.  Social History:  reports that he has never smoked. He has never used smokeless tobacco. He reports that he does not drink alcohol or use drugs.  The patient is from [place].  He speaks [place].  He moves to the [place] in 2000.  He lives in [place] with his mother, father, brother and sister.  He is a Theme park manager.  The patient is accompanied by his mother, sister, and the interpreter Chip Boer) today.   Allergies:  Allergies  Allergen Reactions  . Iodine     Possible anaphylaxis per MD Per conversation with pt, pt denies allergy to IV contrast.  Pt injected 09/26/16 without premeds, pt with no reaction.    Current Medications: Current Outpatient Prescriptions  Medication Sig Dispense Refill  . Diphenhyd-Hydrocort-Nystatin (FIRST-DUKES MOUTHWASH) SUSP Use as directed 10 mLs in the mouth or throat 4 (four) times daily as needed. 480 mL 3  . ferrous sulfate 325 (65 FE) MG EC tablet Take 1 tablet (325 mg total) by mouth daily with breakfast. 30 tablet 3  . gabapentin (NEURONTIN) 100 MG capsule Take 1 capsule (100 mg total) by mouth 3 (three) times daily. 90 capsule 1  . HYDROcodone-acetaminophen (NORCO) 7.5-325 MG tablet Take 1 tablet by mouth every 4 (four) hours as needed for moderate pain. 90 tablet 0  . hydrOXYzine (ATARAX/VISTARIL) 25 MG tablet Take 25 mg by mouth daily.    Marland Kitchen  lactulose (CHRONULAC) 10 GM/15ML solution Take 30 mLs (20 g total) by mouth daily as needed for mild constipation. 120 mL 0  . levocetirizine (XYZAL) 5 MG tablet Take 5 mg by mouth every evening.    Marland Kitchen omeprazole (PRILOSEC) 20 MG capsule Take 1 capsule (20 mg total) by mouth 2 (two) times daily before a meal. 60 capsule 2  . potassium chloride (K-DUR) 10 MEQ tablet  Take 1 tablet (10 mEq total) by mouth 2 (two) times daily. Take 1 tablet daily 60 tablet 1  . prochlorperazine (COMPAZINE) 10 MG tablet Take 1 tablet (10 mg total) by mouth every 6 (six) hours as needed for nausea or vomiting. 30 tablet 1   Current Facility-Administered Medications  Medication Dose Route Frequency Provider Last Rate Last Dose  . albumin human 25 % solution 12.5 g  12.5 g Intravenous Once Karen Kitchens, NP       Facility-Administered Medications Ordered in Other Visits  Medication Dose Route Frequency Provider Last Rate Last Dose  . heparin lock flush 100 unit/mL  500 Units Intravenous Once Corcoran, Melissa C, MD      . pegfilgrastim (NEULASTA) injection 6 mg  6 mg Subcutaneous Once Corcoran, Melissa C, MD      . sodium chloride 0.9 % injection 10 mL  10 mL Intravenous Once Corcoran, Melissa C, MD      . sodium chloride flush (NS) 0.9 % injection 10 mL  10 mL Intravenous PRN Lequita Asal, MD   10 mL at 08/28/15 5643    Review of Systems:  GENERAL:  Feels "ok".  No fevers or sweats.  Weight down 7 pounds. PERFORMANCE STATUS (ECOG):  1 HEENT:  No visual changes, runny nose, sore throat, mouth sores or tenderness. Lungs:  No shortness of breath or cough.  No hemoptysis. Cardiac:  No chest pain, palpitations, orthopnea, or PND. GI:    Abdominal discomfort prior to paracentesis (see HPI).  Decreased oral intake secondary to ascites.  No nausea, vomiting, diarrhea, constipation, melena or hematochezia. GU:  No urgency, frequency, dysuria, or hematuria. Musculoskeletal:  No back pain.  No rib pain.  No muscle tenderness. Extremities:  No pain or swelling. Skin:  No rashes or ulcers. Neuro:  No neuropathy.  No headache, numbness or weakness, balance or coordination issues. Endocrine:  No diabetes, thyroid issues, hot flashes or night sweats. Psych:  No mood changes, depression or anxiety. Pain:  Abdominal discomfort (9 out of 10). Review of systems:  All other systems  reviewed and found to be negative.  Physical Exam: Blood pressure 97/69, pulse 92, temperature 98.6 F (37 C), temperature source Tympanic, resp. rate 18, weight 170 lb 6 oz (77.3 kg). GENERAL:  Well developed, well nourished, gentleman sitting comfortably in the exam room in no acute distress. MENTAL STATUS:  Alert and oriented to person, place and time. HEAD:  Wearing a cap.  Scruffy facial hair.  Normocephalic, atraumatic, face thinning and symmetric, no Cushingoid features. EYES:  Brown eyes.  Pupils equal rounfd and reactive to light and accomodation.  No conjunctivitis or scleral icterus. ENT:  Oropharynx clear without lesion.  Tongue normal. Mucous membranes moist.  RESPIRATORY:  Clear to auscultation without rales, wheezes or rhonchi. CARDIOVASCULAR:  Regular rate and rhythm without murmur, rub or gallop. ABDOMEN:  Slightly distended.  Soft with active bowel sounds, no hepatosplenomegaly.  No masses.  Fluid wave. SKIN:  No urticaria.  No rashes or ulcers. EXTREMITIES: No edema, no skin discoloration or tenderness.  No palpable cords. LYMPH NODES: No palpable cervical, supraclavicular, axillary or inguinal adenopathy  NEUROLOGICAL: Unremarkable. PSYCH:  Appropriate.   Imaging studies: 07/29/2015:  Chest, abdomen and pelvic CT revealed a 4 cm thickened and irregular gastric wall compatible with an infiltrative malignancy. There was extensive involvement of the upper mesentery and omentum compatible with peritoneal carcinomatosis. There was a small amount of ascites.    08/08/2015:  Head CT revealed no evidence of metastatic disease. 10/05/2015:  Abdomen and pelvic CT revealed a positive response to therapy with decreased size of the primary mass along the lesser curvature of the stomach, decrease extension of the mass into the gastrohepatic ligament, decreasing evidence of peritoneal carcinomatosis involving predominantly the omentum, and resolution of previously noted malignant ascites.  There was a potential ulcer along the greater curvature of the mid body of the stomach.   12/24/2015:  Abdomen and pelvic CT revealed further improvement in primary gastric malignancy and adjacent lymphadenopathy. There was no residual peritoneal carcinomatosis identified. 03/21/2016:  Abdominal CT revealed new small volume ascites within the abdomen extending over the liver and spleen.  There was increase soft tissue stranding throughout the peritoneal cavity worrisome for recurrence of peritoneal carcinomatosis. 04/24/2016:  Abdomen and pelvic CT revealed progressive intraperitoneal metastatic disease with increased ascites and omental caking. 06/17/2016:  Abdomen and pelvic CT revealed decreased omental metastatic disease.  The volume of ascites had decreased. 08/30/2016:  Abdomen and pelvic CT revealed increased amount of ascites compared to prior exam, as well as multiple small omental densities compared to prior exam suggesting increased peritoneal carcinomatosis. 09/04/2016:  Chest CT revealed no evidence for thoracic metastasis. 09/26/2016:  Abdomen and pelvic CT revealed extensive ascites, multiple omental metastasis (stable), wall thickening in the gastric antrum and proximal duodenum (stable) and no evidence of obstruction.   No visits with results within 3 Day(s) from this visit.  Latest known visit with results is:  Appointment on 10/21/2016  Component Date Value Ref Range Status  . WBC 10/21/2016 7.9  3.8 - 10.6 K/uL Final  . RBC 10/21/2016 5.03  4.40 - 5.90 MIL/uL Final  . Hemoglobin 10/21/2016 13.8  13.0 - 18.0 g/dL Final  . HCT 10/21/2016 39.9* 40.0 - 52.0 % Final  . MCV 10/21/2016 79.4* 80.0 - 100.0 fL Final  . MCH 10/21/2016 27.4  26.0 - 34.0 pg Final  . MCHC 10/21/2016 34.6  32.0 - 36.0 g/dL Final  . RDW 10/21/2016 15.4* 11.5 - 14.5 % Final  . Platelets 10/21/2016 329  150 - 440 K/uL Final  . Neutrophils Relative % 10/21/2016 78  % Final  . Neutro Abs 10/21/2016 6.1  1.4 -  6.5 K/uL Final  . Lymphocytes Relative 10/21/2016 12  % Final  . Lymphs Abs 10/21/2016 1.0  1.0 - 3.6 K/uL Final  . Monocytes Relative 10/21/2016 8  % Final  . Monocytes Absolute 10/21/2016 0.6  0.2 - 1.0 K/uL Final  . Eosinophils Relative 10/21/2016 1  % Final  . Eosinophils Absolute 10/21/2016 0.1  0 - 0.7 K/uL Final  . Basophils Relative 10/21/2016 1  % Final  . Basophils Absolute 10/21/2016 0.1  0 - 0.1 K/uL Final  . Sodium 10/21/2016 136  135 - 145 mmol/L Final  . Potassium 10/21/2016 3.6  3.5 - 5.1 mmol/L Final  . Chloride 10/21/2016 101  101 - 111 mmol/L Final  . CO2 10/21/2016 26  22 - 32 mmol/L Final  . Glucose, Bld 10/21/2016 114* 65 - 99 mg/dL Final  . BUN  10/21/2016 13  6 - 20 mg/dL Final  . Creatinine, Ser 10/21/2016 0.72  0.61 - 1.24 mg/dL Final  . Calcium 10/21/2016 8.6* 8.9 - 10.3 mg/dL Final  . Total Protein 10/21/2016 6.4* 6.5 - 8.1 g/dL Final  . Albumin 10/21/2016 2.5* 3.5 - 5.0 g/dL Final  . AST 10/21/2016 23  15 - 41 U/L Final  . ALT 10/21/2016 14* 17 - 63 U/L Final  . Alkaline Phosphatase 10/21/2016 210* 38 - 126 U/L Final  . Total Bilirubin 10/21/2016 0.7  0.3 - 1.2 mg/dL Final  . GFR calc non Af Amer 10/21/2016 >60  >60 mL/min Final  . GFR calc Af Amer 10/21/2016 >60  >60 mL/min Final   Comment: (NOTE) The eGFR has been calculated using the CKD EPI equation. This calculation has not been validated in all clinical situations. eGFR's persistently <60 mL/min signify possible Chronic Kidney Disease.   . Anion gap 10/21/2016 9  5 - 15 Final  . Magnesium 10/21/2016 1.9  1.7 - 2.4 mg/dL Final  . TSH 10/21/2016 5.047* 0.350 - 4.500 uIU/mL Final   Performed by a 3rd Generation assay with a functional sensitivity of <=0.01 uIU/mL.  Marland Kitchen Ferritin 10/21/2016 181  24 - 336 ng/mL Final  . Free T4 10/21/2016 1.19* 0.61 - 1.12 ng/dL Final   Comment: (NOTE) Biotin ingestion may interfere with free T4 tests. If the results are inconsistent with the TSH level, previous test  results, or the clinical presentation, then consider biotin interference. If needed, order repeat testing after stopping biotin.     Assessment:  Addiel Mccardle is a 36 y.o. male with metastatic gastric cancer.  He presented with a 4-6 week history of progressive epigastric pain.  EGD on 08/02/2015 revealed a large, ulcerated, non-circumferential mass with oozing and stigmata of recent bleeding in the entire examined stomach.  The mass involved the fundus down to the antrum.  Biopsies revealed poorly differentiated adenocarcinoma.  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  Her2/neu testing was negative.  Microsatellite testing was negative.  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  PDL-1 was expressed.  CEA was 0.3 on 07/30/2015.  Chest, abdomen and pelvic CT scan on 07/29/2015 revealed a 4 cm thickened and irregular gastric wall compatible with an infiltrative malignancy. There was extensive involvement of the upper mesentery and omentum compatible with peritoneal carcinomatosis. There was a small amount of ascites.     He completed treatment for H pylori  (began 08/09/2015).  Stool was negative for H pylori on 11/01/2015.  He has iron deficiency anemia likely gastric oozing and modest diet.  RBC are microcytic.  Labs on 08/21/2015 revealed a ferritin was 44 with an iron saturation 4% and a TIBC of 395.  Ferritin was 116 on 09/18/2015.  He received 12 cycles of FOLFOX chemotherapy (08/14/2015 - 02/05/2016).  Chemotherapy was held on 10/23/2015 secondary to thrombocytopenia (87,000).  Platelet count was 90,000 prior to cycle #11.  He has tolerated his chemotherapy well without diarrhea, mouth sores, or neuropathy.  EGD on 02/26/2016 revealed one non-bleeding gastric ulcer.   The mass appeared smaller.  Biopsy revealed  moderate chronic gastritis and edema, negative for H pylori, dysplasia and malignancy.  He received 3 cycles of maintenance 5FU/LV (03/03/2016 - 04/08/2016).  He received 5 cycles of ramucirumab + Taxol (05/01/2016 - 08/20/2016).  He notes slight transient tingling in his fingertips.  Chemotherapy was held on day 15 of cycle #3 secondary to neutropenia (ANC 600).  Cycle #5 was truncated early secondary to progressive disease.  He receives Granix if WBC < 3,000 or ANC < 1500.  Abdomen and pelvic CT on 08/30/2016 revealed increased amount of ascites compared to prior exam, as well as multiple small omental densities compared to prior exam suggesting increased peritoneal carcinomatosis. Chest CT on 09/04/2016 revealed no evidence for thoracic metastasis.  He received 2 cycles of pembrolizumab (09/09/2016 - 09/30/2016).  He tolerated his infusion well without side effect.  He has undergone paracentesis x 4 (5.6 liters on 09/26/2016, 4.2 liters on 10/03/2016, 7 liters on 10/14/2016, and 4.8 liters on 10/21/2016).  Cytology on 10/03/2016 revealed few scattered atypical cells c/w adenocarcinoma.  He has a history of urticaria of unclear etiology.  He is followed by dermatology.  He takes Zyrtec every morning and hydroxyzine at night.  He takes Xyzal 5 mg daily.  He was instructed to use mild soaps and not to use perfumes.   Symptomatically, he is doing well.  Pain is well controlled.  He is able to eat normally when his abdomen is not distended. He is enrolling on a clinical trial at [facility].    Plan: 1.  Discuss appointment at [facility].  Discuss protocol enrollment. 2.  Discuss paracentesis.  Last ultrasound on 10/28/2016 revealed minimal fluid. Patient wants to cancel future procedures, electing to call when he needs to have fluid removed.  3.  Patient entering into a clinical trial at [facility] with Dr. Aleatha Borer. Dr. Mike Gip to communicate with [facility] throughout his course of treatment.  4.  RTC in 4 weeks for MD  assessment. Labs will be reviewed from [facility].    Melissa C. Mike Gip, MD  10/31/2016, 10:04 AM

## 2016-11-04 ENCOUNTER — Ambulatory Visit: Payer: Self-pay

## 2016-11-04 ENCOUNTER — Telehealth: Payer: Self-pay | Admitting: *Deleted

## 2016-11-04 ENCOUNTER — Ambulatory Visit: Admission: RE | Admit: 2016-11-04 | Payer: Self-pay | Source: Ambulatory Visit

## 2016-11-04 NOTE — Telephone Encounter (Signed)
Allyson called to report that patient was a No Show for his paracentesis today. He does have another appointment next Tuesday.  I spoke with Verdis Frederickson who stated all appts were cancelled and he will call when feeling bad.  I returned call to [facility]  And explained that all appts were cancelled on 9/25 and that he was to come today.

## 2016-11-09 ENCOUNTER — Emergency Department
Admission: EM | Admit: 2016-11-09 | Discharge: 2016-11-09 | Disposition: A | Discharge status: Home / Self Care | Payer: 59 | Attending: Emergency Medicine | Admitting: Emergency Medicine

## 2016-11-09 ENCOUNTER — Emergency Department: Payer: 59

## 2016-11-09 DIAGNOSIS — R1030 Lower abdominal pain, unspecified: Secondary | ICD-10-CM | POA: Diagnosis present

## 2016-11-09 DIAGNOSIS — R188 Other ascites: Secondary | ICD-10-CM

## 2016-11-09 DIAGNOSIS — R1110 Vomiting, unspecified: Secondary | ICD-10-CM | POA: Diagnosis not present

## 2016-11-09 DIAGNOSIS — R180 Malignant ascites: Secondary | ICD-10-CM

## 2016-11-09 DIAGNOSIS — R112 Nausea with vomiting, unspecified: Secondary | ICD-10-CM

## 2016-11-09 DIAGNOSIS — R109 Unspecified abdominal pain: Secondary | ICD-10-CM

## 2016-11-09 DIAGNOSIS — D002 Carcinoma in situ of stomach: Secondary | ICD-10-CM | POA: Diagnosis not present

## 2016-11-09 LAB — URINALYSIS, COMPLETE (UACMP) WITH MICROSCOPIC
GLUCOSE, UA: NEGATIVE mg/dL
HGB URINE DIPSTICK: NEGATIVE
KETONES UR: 20 mg/dL — AB
LEUKOCYTES UA: NEGATIVE
Nitrite: NEGATIVE
PH: 5 (ref 5.0–8.0)
Protein, ur: 100 mg/dL — AB
RBC / HPF: NONE SEEN RBC/hpf (ref 0–5)
SPECIFIC GRAVITY, URINE: 1.035 — AB (ref 1.005–1.030)
Squamous Epithelial / LPF: NONE SEEN

## 2016-11-09 LAB — CBC
HCT: 36.1 % — ABNORMAL LOW (ref 40.0–52.0)
Hemoglobin: 12 g/dL — ABNORMAL LOW (ref 13.0–18.0)
MCH: 25.4 pg — AB (ref 26.0–34.0)
MCHC: 33.1 g/dL (ref 32.0–36.0)
MCV: 76.9 fL — AB (ref 80.0–100.0)
PLATELETS: 354 10*3/uL (ref 150–440)
RBC: 4.7 MIL/uL (ref 4.40–5.90)
RDW: 15.2 % — AB (ref 11.5–14.5)
WBC: 11.3 10*3/uL — ABNORMAL HIGH (ref 3.8–10.6)

## 2016-11-09 LAB — BASIC METABOLIC PANEL
Anion gap: 11 (ref 5–15)
BUN: 12 mg/dL (ref 6–20)
CHLORIDE: 102 mmol/L (ref 101–111)
CO2: 25 mmol/L (ref 22–32)
CREATININE: 0.81 mg/dL (ref 0.61–1.24)
Calcium: 8.9 mg/dL (ref 8.9–10.3)
GFR calc Af Amer: >60 mL/min (ref >60)
GFR calc non Af Amer: >60 mL/min (ref >60)
GLUCOSE: 137 mg/dL — AB (ref 65–99)
Potassium: 3.8 mmol/L (ref 3.5–5.1)
Sodium: 138 mmol/L (ref 135–145)

## 2016-11-09 MED ORDER — PROMETHAZINE HCL 25 MG PO TABS: 25.0000 mg | ORAL_TABLET | ORAL | 1 refills | Status: DC | PRN

## 2016-11-09 MED ORDER — HYDROMORPHONE HCL 1 MG/ML IJ SOLN
0.5000 mg | Freq: Once | INTRAMUSCULAR | Status: AC
  Administered 2016-11-09: 0.5 mg via INTRAVENOUS
  Filled 2016-11-09: qty 1

## 2016-11-09 MED ORDER — MORPHINE SULFATE (PF) 4 MG/ML IV SOLN
4.0000 mg | Freq: Once | INTRAVENOUS | Status: AC
  Administered 2016-11-09: 4 mg via INTRAVENOUS
  Filled 2016-11-09: qty 1

## 2016-11-09 MED ORDER — HYDROMORPHONE HCL 1 MG/ML IJ SOLN: 1.0000 mg | Freq: Once | INTRAMUSCULAR | Status: DC

## 2016-11-09 MED ORDER — OXYCODONE-ACETAMINOPHEN 7.5-325 MG PO TABS: 1.0000 | ORAL_TABLET | ORAL | 0 refills | Status: DC | PRN

## 2016-11-09 MED ORDER — SODIUM CHLORIDE 0.9 % IV SOLN
Freq: Once | INTRAVENOUS | Status: AC
  Administered 2016-11-09: 08:00:00 via INTRAVENOUS

## 2016-11-09 MED ORDER — ONDANSETRON HCL 4 MG/2ML IJ SOLN
4.0000 mg | Freq: Once | INTRAMUSCULAR | Status: AC
  Administered 2016-11-09: 4 mg via INTRAVENOUS
  Filled 2016-11-09: qty 2

## 2016-11-09 NOTE — ED Triage Notes (Signed)
Information obtained using [employer] #[id number], Simona Huh.  Patient reports having bilateral flank pain that radiates into lower abdomen with nausea.

## 2016-11-09 NOTE — ED Provider Notes (Signed)
[facility] Emergency Department Provider Note       Time seen: ----------------------------------------- 7:37 AM on 11/09/2016 -----------------------------------------     I have reviewed the triage vital signs and the nursing notes.   HISTORY   Chief Complaint Abdominal Pain and Flank Pain    HPI Anthony Melendez is a 36 y.o. male who presents to the ED for abdominal pain and flank pain. Patient describes pain in both flanks that radiates into his lower abdomen. She's had nausea and vomiting and has had difficulty keeping anything down. Reportedly has a history of malignant ascites from gastric cancer. Currently is under chemotherapy treatment and is scheduled to go to [facility] for an experimental type of treatment. Pain is 8 out of 10 in both flanks. Nothing makes his symptoms better. He was exposed to have paracentesis performed 5 days ago but did not show up because he states there was no swelling at that time.  Past Medical History:  Diagnosis Date  . Gastric cancer ([facility]) 08/2015   Folfox chemo tx's.  Marland Kitchen GERD (gastroesophageal reflux disease)     Patient Active Problem List   Diagnosis Date Noted  . Malignant ascites 09/30/2016  . Encounter for antineoplastic immunotherapy 09/09/2016  . Cancer related pain 09/02/2016  . Chemotherapy-induced neutropenia (HCC) 07/21/2016  . Goals of care, counseling/discussion 05/29/2016  . Urticaria 04/22/2016  . 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 Chronic peptic ulcer of stomach   . Encounter for antineoplastic chemotherapy 01/15/2016  . 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 Chronic gastric ulcer 10/28/2015  . Hypocalcemia 09/30/2015  . Iron deficiency anemia 08/28/2015  . Microcytic red blood cells 08/14/2015  . Hypokalemia 08/14/2015  . Malignant neoplasm of body of stomach (HCC) 08/07/2015  . Helicobacter pylori gastritis 08/02/2015  . Abnormal findings-gastrointestinal tract   .  Neoplasm of digestive system   . Gastric wall thickening 08/01/2015  . 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 ESOPHAGOGASTRODUODENOSCOPY (EGD) WITH PROPOFOL N/A 08/02/2015   Procedure: ESOPHAGOGASTRODUODENOSCOPY (EGD) WITH PROPOFOL;  Surgeon: Midge Minium, MD;  Location: [facility];  Service: Endoscopy;  Laterality: N/A;  . ESOPHAGOGASTRODUODENOSCOPY (EGD) WITH PROPOFOL N/A 02/26/2016   Procedure: ESOPHAGOGASTRODUODENOSCOPY (EGD) WITH PROPOFOL;  Surgeon: Midge Minium, MD;  Location: [facility];  Service: Endoscopy;  Laterality: N/A;  . NO PAST SURGERIES    . PERIPHERAL VASCULAR CATHETERIZATION N/A 08/13/2015   Procedure: Shelda Pal Cath Insertion;  Surgeon: Annice Needy, MD;  Location: [facility];  Service: Cardiovascular;  Laterality: N/A;    Allergies Iodine  Social History Social History  Substance Use Topics  . Smoking status: Never Smoker  . Smokeless tobacco: Never Used  . Alcohol use No    Review of Systems Constitutional: Negative for fever. Cardiovascular: Negative for chest pain. Respiratory: Negative for shortness of breath. Gastrointestinal: positive for abdominal pain, vomiting Genitourinary: Negative for dysuria. Musculoskeletal: Negative for back pain. Skin: Negative for rash. Neurological: Negative for headaches, focal weakness or numbness.  All systems negative/normal/unremarkable except as stated in the HPI  ____________________________________________   PHYSICAL EXAM:  VITAL SIGNS: ED Triage Vitals  Enc Vitals Group     BP 11/09/16 0505 115/79     Pulse Rate 11/09/16 0505 (!) 109     Resp 11/09/16 0505 20     Temp 11/09/16 0505 98.2 F (36.8 C)     Temp Source 11/09/16 0505 Oral     SpO2 11/09/16 0505 96 %  Weight 11/09/16 0502 165 lb (74.8 kg)     Height 11/09/16 0502 '5\' 5"'$  (1.651 m)     Head Circumference --      Peak Flow --      Pain Score 11/09/16 0506 8      Pain Loc --      Pain Edu? --      Excl. in [place]? --     Constitutional: Alert and oriented. mild distress, actively vomiting Eyes: Conjunctivae are normal. Normal extraocular movements. ENT   Head: Normocephalic and atraumatic.   Nose: No congestion/rhinnorhea.   Mouth/Throat: Mucous membranes are moist.   Neck: No stridor. Cardiovascular: Normal rate, regular rhythm. No murmurs, rubs, or gallops. Respiratory: Normal respiratory effort without tachypnea nor retractions. Breath sounds are clear and equal bilaterally. No wheezes/rales/rhonchi. Gastrointestinal: tense ascites, nonfocal tenderness. Hypoactive bowel sounds Musculoskeletal: Nontender with normal range of motion in extremities. No lower extremity tenderness nor edema. Neurologic:  Normal speech and language. No gross focal neurologic deficits are appreciated.  Skin:  Skin is warm, dry and intact. No rash noted. Psychiatric: Mood and affect are normal. Speech and behavior are normal.  ____________________________________________  ED COURSE:  Pertinent labs & imaging results that were available during my care of the patient were reviewed by me and considered in my medical decision making (see chart for details). Patient presents for abdominal pain and vomiting with ascites, we will assess with labs and imaging as indicated. patient will likely require paracentesis   Procedures ____________________________________________   LABS (pertinent positives/negatives)  Labs Reviewed  URINALYSIS, COMPLETE (UACMP) WITH MICROSCOPIC - Abnormal; Notable for the following:       Result Value   Color, Urine AMBER (*)    APPearance CLOUDY (*)    Specific Gravity, Urine 1.035 (*)    Bilirubin Urine SMALL (*)    Ketones, ur 20 (*)    Protein, ur 100 (*)    Bacteria, UA RARE (*)    All other components within normal limits  BASIC METABOLIC PANEL - Abnormal; Notable for the following:    Glucose, Bld 137 (*)    All other  components within normal limits  CBC - Abnormal; Notable for the following:    WBC 11.3 (*)    Hemoglobin 12.0 (*)    HCT 36.1 (*)    MCV 76.9 (*)    MCH 25.4 (*)    RDW 15.2 (*)    All other components within normal limits    RADIOLOGY Images were viewed by me  two-view abdomen IMPRESSION: 1. No evidence of bowel obstruction. 2. No visible nephrolithiasis. 3. Suspect ascites. ____________________________________________  DIFFERENTIAL DIAGNOSIS   ascites, SBP, SBO, gastritis, gastroenteritis, pancreatitis   FINAL ASSESSMENT AND PLAN  ascites, vomiting  Plan: patient presented for abdominal pain and vomiting. His lab work was grossly reassuring as are his x-rays. He certainly has significant ascites but we were unable to obtain an ultrasound-guided paracentesis formed today. we have scheduled a paracentesis form in the morning at 10 AM here at the hospital. He then will follow up at [facility] for chemotherapy tomorrow. He'll be discharged with pain medicine and antiemetics.   Earleen Newport, MD   Note: This note was generated in part or whole with voice recognition software. Voice recognition is usually quite accurate but there are transcription errors that can and very often do occur. I apologize for any typographical errors that were not detected and corrected.     Earleen Newport, MD 11/09/16  1026  

## 2016-11-09 NOTE — ED Notes (Signed)
Interpreter services call placed

## 2016-11-10 ENCOUNTER — Other Ambulatory Visit: Payer: Self-pay | Admitting: Urgent Care

## 2016-11-10 ENCOUNTER — Ambulatory Visit
Admit: 2016-11-10 | Discharge: 2016-11-10 | Disposition: A | Discharge status: Home / Self Care | Payer: 59 | Attending: Hematology and Oncology | Admitting: Hematology and Oncology

## 2016-11-10 ENCOUNTER — Other Ambulatory Visit: Payer: Self-pay | Admitting: Hematology and Oncology

## 2016-11-10 DIAGNOSIS — R188 Other ascites: Secondary | ICD-10-CM | POA: Insufficient documentation

## 2016-11-10 DIAGNOSIS — R180 Malignant ascites: Secondary | ICD-10-CM

## 2016-11-10 MED ORDER — ALBUMIN HUMAN 25 % IV SOLN: 12.5000 g | Freq: Once | INTRAVENOUS | Status: DC

## 2016-11-10 NOTE — Discharge Instructions (Signed)
Paracentesis, Care After °Refer to this sheet in the next few weeks. These instructions provide you with information about caring for yourself after your procedure. Your health care provider may also give you more specific instructions. Your treatment has been planned according to current medical practices, but problems sometimes occur. Call your health care provider if you have any problems or questions after your procedure. °What can I expect after the procedure? °After your procedure, it is common to have a small amount of clear fluid coming from the puncture site. °Follow these instructions at home: °· Return to your normal activities as told by your health care provider. Ask your health care provider what activities are safe for you. °· Take over-the-counter and prescription medicines only as told by your health care provider. °· Do not take baths, swim, or use a hot tub until your health care provider approves. °· Follow instructions from your health care provider about: °? How to take care of your puncture site. °? When and how you should change your bandage (dressing). °? When you should remove your dressing. °· Check your puncture area every day signs of infection. Watch for: °? Redness, swelling, or pain. °? Fluid, blood, or pus. °· Keep all follow-up visits as told by your health care provider. This is important. °Contact a health care provider if: °· You have redness, swelling, or pain at your puncture site. °· You start to have more clear fluid coming from your puncture site. °· You have blood or pus coming from your puncture site. °· You have chills. °· You have a fever. °Get help right away if: °· You develop chest pain or shortness of breath. °· You develop increasing pain, discomfort, or swelling in your abdomen. °· You feel dizzy or light-headed or you pass out. °This information is not intended to replace advice given to you by your health care provider. Make sure you discuss any questions you  have with your health care provider. °Document Released: 06/06/2014 Document Revised: 06/28/2015 Document Reviewed: 04/04/2014 °Elsevier Interactive Patient Education © 2018 [employer] ° °

## 2016-11-11 ENCOUNTER — Ambulatory Visit: Payer: Self-pay

## 2016-11-17 ENCOUNTER — Emergency Department
Admission: EM | Admit: 2016-11-17 | Discharge: 2016-11-17 | Disposition: A | Discharge status: Home / Self Care | Payer: 59 | Attending: Emergency Medicine | Admitting: Emergency Medicine

## 2016-11-17 DIAGNOSIS — R110 Nausea: Secondary | ICD-10-CM | POA: Insufficient documentation

## 2016-11-17 DIAGNOSIS — R109 Unspecified abdominal pain: Secondary | ICD-10-CM | POA: Insufficient documentation

## 2016-11-17 DIAGNOSIS — M549 Dorsalgia, unspecified: Secondary | ICD-10-CM | POA: Insufficient documentation

## 2016-11-17 DIAGNOSIS — C786 Secondary malignant neoplasm of retroperitoneum and peritoneum: Secondary | ICD-10-CM

## 2016-11-17 DIAGNOSIS — C801 Malignant (primary) neoplasm, unspecified: Secondary | ICD-10-CM

## 2016-11-17 DIAGNOSIS — Z85028 Personal history of other malignant neoplasm of stomach: Secondary | ICD-10-CM | POA: Insufficient documentation

## 2016-11-17 DIAGNOSIS — R1110 Vomiting, unspecified: Secondary | ICD-10-CM | POA: Insufficient documentation

## 2016-11-17 LAB — CBC
HCT: 33.7 % — ABNORMAL LOW (ref 40.0–52.0)
HEMOGLOBIN: 10.9 g/dL — AB (ref 13.0–18.0)
MCH: 25.1 pg — ABNORMAL LOW (ref 26.0–34.0)
MCHC: 32.5 g/dL (ref 32.0–36.0)
MCV: 77.2 fL — ABNORMAL LOW (ref 80.0–100.0)
PLATELETS: 359 10*3/uL (ref 150–440)
RBC: 4.36 MIL/uL — AB (ref 4.40–5.90)
RDW: 15.6 % — ABNORMAL HIGH (ref 11.5–14.5)
WBC: 10 10*3/uL (ref 3.8–10.6)

## 2016-11-17 LAB — URINALYSIS, COMPLETE (UACMP) WITH MICROSCOPIC
Bacteria, UA: NONE SEEN
Bilirubin Urine: NEGATIVE
GLUCOSE, UA: NEGATIVE mg/dL
HGB URINE DIPSTICK: NEGATIVE
Ketones, ur: 80 mg/dL — AB
LEUKOCYTES UA: NEGATIVE
NITRITE: NEGATIVE
PH: 5 (ref 5.0–8.0)
PROTEIN: 100 mg/dL — AB
Specific Gravity, Urine: >1.046 — ABNORMAL HIGH (ref 1.005–1.030)
Squamous Epithelial / LPF: NONE SEEN
WBC UA: NONE SEEN WBC/hpf (ref 0–5)

## 2016-11-17 LAB — COMPREHENSIVE METABOLIC PANEL
ALK PHOS: 148 U/L — AB (ref 38–126)
ALT: 9 U/L — AB (ref 17–63)
ANION GAP: 17 — AB (ref 5–15)
AST: 21 U/L (ref 15–41)
Albumin: 3.2 g/dL — ABNORMAL LOW (ref 3.5–5.0)
BUN: 11 mg/dL (ref 6–20)
CALCIUM: 9 mg/dL (ref 8.9–10.3)
CO2: 23 mmol/L (ref 22–32)
CREATININE: 0.68 mg/dL (ref 0.61–1.24)
Chloride: 101 mmol/L (ref 101–111)
Glucose, Bld: 110 mg/dL — ABNORMAL HIGH (ref 65–99)
Potassium: 3.3 mmol/L — ABNORMAL LOW (ref 3.5–5.1)
SODIUM: 141 mmol/L (ref 135–145)
TOTAL PROTEIN: 7.9 g/dL (ref 6.5–8.1)
Total Bilirubin: 1 mg/dL (ref 0.3–1.2)

## 2016-11-17 LAB — LIPASE, BLOOD: Lipase: 22 U/L (ref 11–51)

## 2016-11-17 MED ORDER — MORPHINE SULFATE (PF) 4 MG/ML IV SOLN
4.0000 mg | Freq: Once | INTRAVENOUS | Status: AC
  Administered 2016-11-17: 4 mg via INTRAVENOUS
  Filled 2016-11-17: qty 1

## 2016-11-17 MED ORDER — PROMETHAZINE HCL 25 MG/ML IJ SOLN
25.0000 mg | Freq: Once | INTRAMUSCULAR | Status: AC
  Administered 2016-11-17: 25 mg via INTRAVENOUS
  Filled 2016-11-17: qty 1

## 2016-11-17 MED ORDER — SODIUM CHLORIDE 0.9 % IV SOLN
Freq: Once | INTRAVENOUS | Status: AC
  Administered 2016-11-17: 16:00:00 via INTRAVENOUS

## 2016-11-17 MED ORDER — PROMETHAZINE HCL 25 MG PO TABS: 25.0000 mg | ORAL_TABLET | ORAL | 1 refills | Status: AC | PRN

## 2016-11-17 MED ORDER — ONDANSETRON HCL 4 MG/2ML IJ SOLN
4.0000 mg | Freq: Once | INTRAMUSCULAR | Status: AC
  Administered 2016-11-17: 4 mg via INTRAVENOUS
  Filled 2016-11-17: qty 2

## 2016-11-17 MED ORDER — HYDROMORPHONE HCL 1 MG/ML IJ SOLN
1.0000 mg | Freq: Once | INTRAMUSCULAR | Status: AC
  Administered 2016-11-17: 1 mg via INTRAVENOUS
  Filled 2016-11-17: qty 1

## 2016-11-17 MED ORDER — HYDROMORPHONE HCL 2 MG PO TABS: 2.0000 mg | ORAL_TABLET | Freq: Two times a day (BID) | ORAL | 0 refills | Status: DC | PRN

## 2016-11-17 NOTE — ED Notes (Signed)
Interpreter Mariane Masters at bedside.

## 2016-11-17 NOTE — ED Provider Notes (Signed)
[facility] Emergency Department Provider Note       Time seen: ----------------------------------------- 2:10 PM on 11/17/2016 -----------------------------------------     I have reviewed the triage vital signs and the nursing notes.   HISTORY   Chief Complaint Abdominal Pain and Back Pain    HPI Anthony Melendez is a 36 y.o. male with a history of gastric cancer who presents to the ED for abdominal pain, back pain and nausea over the last week. His symptoms are acute on chronic. He arrives stating he was seen at [facility] earlier today was pushed to start chemotherapy treatment but could not start today due to being ill. He last had chemotherapy 2 months ago. 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 Pain is 8 out of 10 in the abdomen.  Past Medical History:  Diagnosis Date  . Gastric cancer ([place]) 08/2015   Folfox chemo tx's.  Marland Kitchen GERD (gastroesophageal reflux disease)     Patient Active Problem List   Diagnosis Date Noted  . Malignant ascites 09/30/2016  . Encounter for antineoplastic immunotherapy 09/09/2016  . Cancer related pain 09/02/2016  . Chemotherapy-induced neutropenia ([place]) 07/21/2016  . Goals of care, counseling/discussion 05/29/2016  . Urticaria 04/22/2016  . 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 Chronic peptic ulcer of stomach   . Encounter for antineoplastic chemotherapy 01/15/2016  . 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 Chronic gastric ulcer 10/28/2015  . Hypocalcemia 09/30/2015  . Iron deficiency anemia 08/28/2015  . Microcytic red blood cells 08/14/2015  . Hypokalemia 08/14/2015  . Malignant neoplasm of body of stomach ([place]) 08/07/2015  . Helicobacter pylori gastritis 08/02/2015  . Abnormal findings-gastrointestinal tract   . Neoplasm of digestive system   . Gastric wall thickening 08/01/2015  . Peritoneal carcinomatosis ([place])  08/01/2015    Past Surgical History:  Procedure Laterality Date  . ESOPHAGOGASTRODUODENOSCOPY (EGD) WITH PROPOFOL N/A 08/02/2015   Procedure: ESOPHAGOGASTRODUODENOSCOPY (EGD) WITH PROPOFOL;  Surgeon: Lucilla Lame, MD;  Location: [place];  Service: Endoscopy;  Laterality: N/A;  . ESOPHAGOGASTRODUODENOSCOPY (EGD) WITH PROPOFOL N/A 02/26/2016   Procedure: ESOPHAGOGASTRODUODENOSCOPY (EGD) WITH PROPOFOL;  Surgeon: Lucilla Lame, MD;  Location: [facility];  Service: Endoscopy;  Laterality: N/A;  . NO PAST SURGERIES    . PERIPHERAL VASCULAR CATHETERIZATION N/A 08/13/2015   Procedure: Glori Luis Cath Insertion;  Surgeon: Algernon Huxley, MD;  Location: [place] [facility];  Service: Cardiovascular;  Laterality: N/A;    Allergies Iodine  Social History Social History  Substance Use Topics  . Smoking status: Never Smoker  . Smokeless tobacco: Never Used  . Alcohol use No    Review of Systems Constitutional: Negative for fever. Cardiovascular: Negative for chest pain. Respiratory: Negative for shortness of breath. Gastrointestinal: positive for abdominal pain, vomiting Genitourinary: Negative for dysuria. Musculoskeletal: positive for back pain Skin: Negative for rash. Neurological: Negative for headaches, focal weakness or numbness.  All systems negative/normal/unremarkable except as stated in the HPI  ____________________________________________   PHYSICAL EXAM:  VITAL SIGNS: ED Triage Vitals  Enc Vitals Group     BP 11/17/16 1341 121/87     Pulse Rate 11/17/16 1341 (!) 116     Resp 11/17/16 1341 18     Temp 11/17/16 1341 99.5 F (37.5 C)     Temp Source 11/17/16 1341 Oral     SpO2 11/17/16 1341 97 %     Weight 11/17/16 1342 165 lb (74.8 kg)     Height 11/17/16  1342 '5\' 5"'$  (1.651 m)     Head Circumference --      Peak Flow --      Pain Score 11/17/16 1341 8     Pain Loc --      Pain Edu? --      Excl. in [place]? --    Constitutional: Alert and oriented. mild  distress Eyes: Conjunctivae are normal. Normal extraocular movements. ENT   Head: Normocephalic and atraumatic.   Nose: No congestion/rhinnorhea.   Mouth/Throat: Mucous membranes are moist.   Neck: No stridor. Cardiovascular: Normal rate, regular rhythm. No murmurs, rubs, or gallops. Respiratory: Normal respiratory effort without tachypnea nor retractions. Breath sounds are clear and equal bilaterally. No wheezes/rales/rhonchi. Gastrointestinal: nonfocal tenderness, ascites, hypoactive bowel sounds. Musculoskeletal: Nontender with normal range of motion in extremities. No lower extremity tenderness nor edema. Neurologic:  Normal speech and language. No gross focal neurologic deficits are appreciated.  Skin:  Skin is warm, dry and intact. No rash noted. Psychiatric: Mood and affect are normal. Speech and behavior are normal.  ____________________________________________  ED COURSE:  Pertinent labs & imaging results that were available during my care of the patient were reviewed by me and considered in my medical decision making (see chart for details). Patient presents for abdominal pain with nausea and back pain with malignant gastric cancer, we will assess with labs and imaging as indicated. Clinical Course as of Nov 18 1655  Mon Nov 17, 2016  1533 I discussed the situation through the interpreter. The family would like to try to treat his symptoms here and be discharged home with pain and nausea medicine.  [JW]    Clinical Course User Index [JW] Earleen Newport, MD   Procedures ____________________________________________   LABS (pertinent positives/negatives)  Labs Reviewed  COMPREHENSIVE METABOLIC PANEL - Abnormal; Notable for the following:       Result Value   Potassium 3.3 (*)    Glucose, Bld 110 (*)    Albumin 3.2 (*)    ALT 9 (*)    Alkaline Phosphatase 148 (*)    Anion gap 17 (*)    All other components within normal limits  CBC - Abnormal; Notable  for the following:    RBC 4.36 (*)    Hemoglobin 10.9 (*)    HCT 33.7 (*)    MCV 77.2 (*)    MCH 25.1 (*)    RDW 15.6 (*)    All other components within normal limits  URINALYSIS, COMPLETE (UACMP) WITH MICROSCOPIC - Abnormal; Notable for the following:    Color, Urine AMBER (*)    APPearance HAZY (*)    Specific Gravity, Urine >1.046 (*)    Ketones, ur 80 (*)    Protein, ur 100 (*)    All other components within normal limits  LIPASE, BLOOD    RADIOLOGY  CT imaging from today at [facility] IMPRESSION: --Moderate volume ascites with peritoneal thickening and extensive mesenteric nodularity, similar to prior study and consistent with peritoneal carcinomatosis.  --Small left pleural effusion, slightly increased in size from prior study.  --Splenomegaly, similar to prior study. ____________________________________________  DIFFERENTIAL DIAGNOSIS   malignant ascites, SBP, metastasis, small bowel obstruction, dehydration, electrolyte abnormality, gastroenteritis  FINAL ASSESSMENT AND PLAN  abdominal pain  Plan: Patient had presented for abdominal pain and vomiting. Patients labs were within normal limits for him. Patients imaging performed at [facility] today were unchanged from prior. I will prescribe pain medicine and antiemetics given the fact that he has metastatic  cancer and peritoneal carcinomatosis.   Earleen Newport, MD   Note: This note was generated in part or whole with voice recognition software. Voice recognition is usually quite accurate but there are transcription errors that can and very often do occur. I apologize for any typographical errors that were not detected and corrected.     Earleen Newport, MD 11/17/16 [phone]

## 2016-11-17 NOTE — ED Triage Notes (Addendum)
Pt arrives to ER via POV c/o abdominal pain, back pain and nausea X 1 week. Pt alert and oriented X4, active, cooperative, pt in NAD. RR even and unlabored, color WNL.  Pt has CA, was going to be started with treatment today, but could not due to being ill. . Pt had chemo approx 2 months ago.

## 2016-11-17 NOTE — ED Notes (Signed)
Awaiting interpreter

## 2016-11-17 NOTE — ED Notes (Signed)
Interpreter Jacqui at bedside for discharge instructions.

## 2016-11-17 NOTE — ED Notes (Signed)
Patient denies pain and is resting comfortably.  

## 2016-11-18 ENCOUNTER — Ambulatory Visit: Payer: Self-pay

## 2016-11-20 ENCOUNTER — Emergency Department: Payer: 59

## 2016-11-20 ENCOUNTER — Encounter: Payer: Self-pay | Admitting: Emergency Medicine

## 2016-11-20 ENCOUNTER — Inpatient Hospital Stay
Admission: EM | Admit: 2016-11-20 | Discharge: 2016-12-04 | DRG: 871 | Disposition: A | Discharge status: Other Acute Inpatient Hospital | Payer: 59 | Attending: Internal Medicine | Admitting: Internal Medicine

## 2016-11-20 DIAGNOSIS — R197 Diarrhea, unspecified: Secondary | ICD-10-CM | POA: Diagnosis not present

## 2016-11-20 DIAGNOSIS — Y95 Nosocomial condition: Secondary | ICD-10-CM | POA: Diagnosis present

## 2016-11-20 DIAGNOSIS — K219 Gastro-esophageal reflux disease without esophagitis: Secondary | ICD-10-CM | POA: Diagnosis present

## 2016-11-20 DIAGNOSIS — R112 Nausea with vomiting, unspecified: Secondary | ICD-10-CM

## 2016-11-20 DIAGNOSIS — R180 Malignant ascites: Secondary | ICD-10-CM | POA: Diagnosis present

## 2016-11-20 DIAGNOSIS — K259 Gastric ulcer, unspecified as acute or chronic, without hemorrhage or perforation: Secondary | ICD-10-CM | POA: Diagnosis present

## 2016-11-20 DIAGNOSIS — G4700 Insomnia, unspecified: Secondary | ICD-10-CM | POA: Diagnosis present

## 2016-11-20 DIAGNOSIS — E876 Hypokalemia: Secondary | ICD-10-CM | POA: Diagnosis present

## 2016-11-20 DIAGNOSIS — E43 Unspecified severe protein-calorie malnutrition: Secondary | ICD-10-CM | POA: Diagnosis present

## 2016-11-20 DIAGNOSIS — K567 Ileus, unspecified: Secondary | ICD-10-CM

## 2016-11-20 DIAGNOSIS — C160 Malignant neoplasm of cardia: Secondary | ICD-10-CM | POA: Diagnosis not present

## 2016-11-20 DIAGNOSIS — J189 Pneumonia, unspecified organism: Secondary | ICD-10-CM | POA: Diagnosis present

## 2016-11-20 DIAGNOSIS — R066 Hiccough: Secondary | ICD-10-CM | POA: Diagnosis present

## 2016-11-20 DIAGNOSIS — G893 Neoplasm related pain (acute) (chronic): Secondary | ICD-10-CM | POA: Diagnosis not present

## 2016-11-20 DIAGNOSIS — R627 Adult failure to thrive: Secondary | ICD-10-CM | POA: Diagnosis present

## 2016-11-20 DIAGNOSIS — R509 Fever, unspecified: Secondary | ICD-10-CM | POA: Diagnosis present

## 2016-11-20 DIAGNOSIS — C786 Secondary malignant neoplasm of retroperitoneum and peritoneum: Secondary | ICD-10-CM | POA: Diagnosis present

## 2016-11-20 DIAGNOSIS — A419 Sepsis, unspecified organism: Principal | ICD-10-CM | POA: Diagnosis present

## 2016-11-20 DIAGNOSIS — Z9221 Personal history of antineoplastic chemotherapy: Secondary | ICD-10-CM | POA: Diagnosis not present

## 2016-11-20 DIAGNOSIS — T451X5A Adverse effect of antineoplastic and immunosuppressive drugs, initial encounter: Secondary | ICD-10-CM | POA: Diagnosis not present

## 2016-11-20 DIAGNOSIS — K253 Acute gastric ulcer without hemorrhage or perforation: Secondary | ICD-10-CM | POA: Diagnosis not present

## 2016-11-20 DIAGNOSIS — R1114 Bilious vomiting: Secondary | ICD-10-CM

## 2016-11-20 DIAGNOSIS — R634 Abnormal weight loss: Secondary | ICD-10-CM

## 2016-11-20 DIAGNOSIS — R5383 Other fatigue: Secondary | ICD-10-CM | POA: Diagnosis not present

## 2016-11-20 DIAGNOSIS — K56609 Unspecified intestinal obstruction, unspecified as to partial versus complete obstruction: Secondary | ICD-10-CM

## 2016-11-20 DIAGNOSIS — R609 Edema, unspecified: Secondary | ICD-10-CM

## 2016-11-20 DIAGNOSIS — C169 Malignant neoplasm of stomach, unspecified: Secondary | ICD-10-CM | POA: Diagnosis not present

## 2016-11-20 DIAGNOSIS — K56699 Other intestinal obstruction unspecified as to partial versus complete obstruction: Secondary | ICD-10-CM | POA: Diagnosis not present

## 2016-11-20 DIAGNOSIS — C162 Malignant neoplasm of body of stomach: Secondary | ICD-10-CM | POA: Diagnosis not present

## 2016-11-20 DIAGNOSIS — Z6823 Body mass index (BMI) 23.0-23.9, adult: Secondary | ICD-10-CM | POA: Diagnosis not present

## 2016-11-20 DIAGNOSIS — D701 Agranulocytosis secondary to cancer chemotherapy: Secondary | ICD-10-CM | POA: Diagnosis not present

## 2016-11-20 DIAGNOSIS — R1011 Right upper quadrant pain: Secondary | ICD-10-CM

## 2016-11-20 DIAGNOSIS — Z7901 Long term (current) use of anticoagulants: Secondary | ICD-10-CM | POA: Diagnosis not present

## 2016-11-20 DIAGNOSIS — D899 Disorder involving the immune mechanism, unspecified: Secondary | ICD-10-CM | POA: Diagnosis present

## 2016-11-20 DIAGNOSIS — R630 Anorexia: Secondary | ICD-10-CM | POA: Diagnosis not present

## 2016-11-20 DIAGNOSIS — K257 Chronic gastric ulcer without hemorrhage or perforation: Secondary | ICD-10-CM | POA: Diagnosis present

## 2016-11-20 DIAGNOSIS — Z79899 Other long term (current) drug therapy: Secondary | ICD-10-CM | POA: Diagnosis not present

## 2016-11-20 DIAGNOSIS — E538 Deficiency of other specified B group vitamins: Secondary | ICD-10-CM | POA: Diagnosis present

## 2016-11-20 LAB — CBC
HCT: 36.5 % — ABNORMAL LOW (ref 40.0–52.0)
Hemoglobin: 11.7 g/dL — ABNORMAL LOW (ref 13.0–18.0)
MCH: 24.6 pg — AB (ref 26.0–34.0)
MCHC: 32 g/dL (ref 32.0–36.0)
MCV: 76.9 fL — ABNORMAL LOW (ref 80.0–100.0)
PLATELETS: 340 10*3/uL (ref 150–440)
RBC: 4.75 MIL/uL (ref 4.40–5.90)
RDW: 15.4 % — AB (ref 11.5–14.5)
WBC: 9.9 10*3/uL (ref 3.8–10.6)

## 2016-11-20 LAB — LIPASE, BLOOD: Lipase: 26 U/L (ref 11–51)

## 2016-11-20 LAB — COMPREHENSIVE METABOLIC PANEL
ALK PHOS: 150 U/L — AB (ref 38–126)
ALT: 9 U/L — AB (ref 17–63)
ANION GAP: 14 (ref 5–15)
AST: 21 U/L (ref 15–41)
Albumin: 3.2 g/dL — ABNORMAL LOW (ref 3.5–5.0)
BILIRUBIN TOTAL: 1 mg/dL (ref 0.3–1.2)
BUN: 11 mg/dL (ref 6–20)
CALCIUM: 9 mg/dL (ref 8.9–10.3)
CO2: 24 mmol/L (ref 22–32)
CREATININE: 0.76 mg/dL (ref 0.61–1.24)
Chloride: 101 mmol/L (ref 101–111)
GFR calc Af Amer: >60 mL/min (ref >60)
GFR calc non Af Amer: >60 mL/min (ref >60)
GLUCOSE: 96 mg/dL (ref 65–99)
Potassium: 4 mmol/L (ref 3.5–5.1)
SODIUM: 139 mmol/L (ref 135–145)
TOTAL PROTEIN: 8.1 g/dL (ref 6.5–8.1)

## 2016-11-20 LAB — LACTIC ACID, PLASMA: LACTIC ACID, VENOUS: 1.2 mmol/L (ref 0.5–1.9)

## 2016-11-20 MED ORDER — ONDANSETRON HCL 4 MG/2ML IJ SOLN
4.0000 mg | Freq: Once | INTRAMUSCULAR | Status: AC
  Administered 2016-11-20: 4 mg via INTRAVENOUS
  Filled 2016-11-20: qty 2

## 2016-11-20 MED ORDER — ONDANSETRON HCL 4 MG/2ML IJ SOLN
4.0000 mg | Freq: Four times a day (QID) | INTRAMUSCULAR | Status: DC | PRN
  Administered 2016-11-21 – 2016-12-04 (×22): 4 mg via INTRAVENOUS
  Filled 2016-11-20 (×23): qty 2

## 2016-11-20 MED ORDER — VANCOMYCIN HCL IN DEXTROSE 1-5 GM/200ML-% IV SOLN
1000.0000 mg | Freq: Once | INTRAVENOUS | Status: AC
  Administered 2016-11-20: 1000 mg via INTRAVENOUS
  Filled 2016-11-20: qty 200

## 2016-11-20 MED ORDER — ACETAMINOPHEN 325 MG PO TABS
650.0000 mg | ORAL_TABLET | Freq: Four times a day (QID) | ORAL | Status: DC | PRN
  Administered 2016-11-21 – 2016-11-23 (×2): 650 mg via ORAL
  Filled 2016-11-20 (×2): qty 2

## 2016-11-20 MED ORDER — SODIUM CHLORIDE 0.9 % IV SOLN
1.0000 g | Freq: Three times a day (TID) | INTRAVENOUS | Status: DC
  Administered 2016-11-21 (×2): 1 g via INTRAVENOUS
  Filled 2016-11-20 (×4): qty 1

## 2016-11-20 MED ORDER — HYDROMORPHONE HCL 1 MG/ML IJ SOLN
1.0000 mg | INTRAMUSCULAR | Status: DC | PRN
  Administered 2016-11-24 – 2016-12-04 (×28): 1 mg via INTRAVENOUS
  Filled 2016-11-20 (×29): qty 1

## 2016-11-20 MED ORDER — SODIUM CHLORIDE 0.9 % IV SOLN
INTRAVENOUS | Status: AC
  Administered 2016-11-20: via INTRAVENOUS

## 2016-11-20 MED ORDER — MORPHINE SULFATE (PF) 4 MG/ML IV SOLN
4.0000 mg | Freq: Once | INTRAVENOUS | Status: AC
  Administered 2016-11-20: 4 mg via INTRAVENOUS
  Filled 2016-11-20: qty 1

## 2016-11-20 MED ORDER — ACETAMINOPHEN 650 MG RE SUPP: 650.0000 mg | Freq: Four times a day (QID) | RECTAL | Status: DC | PRN

## 2016-11-20 MED ORDER — OXYCODONE HCL 5 MG PO TABS
5.0000 mg | ORAL_TABLET | ORAL | Status: DC | PRN
  Administered 2016-11-21 (×2): 5 mg via ORAL
  Filled 2016-11-20 (×2): qty 1

## 2016-11-20 MED ORDER — ACETAMINOPHEN 500 MG PO TABS
1000.0000 mg | ORAL_TABLET | Freq: Once | ORAL | Status: AC
  Administered 2016-11-20: 1000 mg via ORAL
  Filled 2016-11-20: qty 2

## 2016-11-20 MED ORDER — CEFTRIAXONE SODIUM IN DEXTROSE 20 MG/ML IV SOLN
1.0000 g | Freq: Once | INTRAVENOUS | Status: AC
  Administered 2016-11-20: 1 g via INTRAVENOUS
  Filled 2016-11-20: qty 50

## 2016-11-20 MED ORDER — SODIUM CHLORIDE 0.9 % IV BOLUS (SEPSIS)
1000.0000 mL | Freq: Once | INTRAVENOUS | Status: AC
  Administered 2016-11-20: 1000 mL via INTRAVENOUS

## 2016-11-20 MED ORDER — METRONIDAZOLE IN NACL 5-0.79 MG/ML-% IV SOLN
500.0000 mg | Freq: Once | INTRAVENOUS | Status: AC
  Administered 2016-11-20: 500 mg via INTRAVENOUS
  Filled 2016-11-20: qty 100

## 2016-11-20 MED ORDER — ENOXAPARIN SODIUM 40 MG/0.4ML ~~LOC~~ SOLN
40.0000 mg | SUBCUTANEOUS | Status: DC
  Administered 2016-11-21 – 2016-12-04 (×12): 40 mg via SUBCUTANEOUS
  Filled 2016-11-20 (×12): qty 0.4

## 2016-11-20 MED ORDER — ONDANSETRON HCL 4 MG PO TABS: 4.0000 mg | ORAL_TABLET | Freq: Four times a day (QID) | ORAL | Status: DC | PRN

## 2016-11-20 MED ORDER — PROMETHAZINE HCL 25 MG/ML IJ SOLN
25.0000 mg | Freq: Four times a day (QID) | INTRAMUSCULAR | Status: DC | PRN
  Administered 2016-11-21 – 2016-11-22 (×2): 25 mg via INTRAVENOUS
  Filled 2016-11-20 (×2): qty 1

## 2016-11-20 NOTE — H&P (Signed)
[place] [facility] [place] NAME: Anthony Melendez    MR#:  [id number]  DATE OF BIRTH:  12/02/1980  DATE OF ADMISSION:  11/20/2016  PRIMARY CARE PHYSICIAN: Patient, No Pcp Per   REQUESTING/REFERRING PHYSICIAN: Rifenbark, MD  CHIEF COMPLAINT:   Chief Complaint  Patient presents with  . Abdominal Pain    HISTORY OF PRESENT ILLNESS:  Anthony Melendez  is a 36 y.o. male who presents with abdominal pain, weight loss, anorexia. Patient has a history of gastric cancer with malignant ascites and peritoneal carcinomatosis. He is transitioning from standard chemotherapy to trial chemotherapy at [facility]. He has had abdominal pain and increased nausea and vomiting over the past couple of days. Here in the ED he meets sepsis criteria, with possible pneumonia on chest x-ray and urine studies still pending. Hospitalists were called for admission  PAST MEDICAL HISTORY:   Past Medical History:  Diagnosis Date  . Gastric cancer ([place]) 08/2015   Folfox chemo tx's.  Marland Kitchen GERD (gastroesophageal reflux disease)     PAST SURGICAL HISTORY:   Past Surgical History:  Procedure Laterality Date  . ESOPHAGOGASTRODUODENOSCOPY (EGD) WITH PROPOFOL N/A 08/02/2015   Procedure: ESOPHAGOGASTRODUODENOSCOPY (EGD) WITH PROPOFOL;  Surgeon: Lucilla Lame, MD;  Location: [place];  Service: Endoscopy;  Laterality: N/A;  . ESOPHAGOGASTRODUODENOSCOPY (EGD) WITH PROPOFOL N/A 02/26/2016   Procedure: ESOPHAGOGASTRODUODENOSCOPY (EGD) WITH PROPOFOL;  Surgeon: Lucilla Lame, MD;  Location: [facility];  Service: Endoscopy;  Laterality: N/A;  . NO PAST SURGERIES    . PERIPHERAL VASCULAR CATHETERIZATION N/A 08/13/2015   Procedure: Glori Luis Cath Insertion;  Surgeon: Algernon Huxley, MD;  Location: [place] [facility];  Service: Cardiovascular;  Laterality: N/A;    SOCIAL HISTORY:   Social History  Substance Use Topics  . Smoking status: Never Smoker  . Smokeless tobacco: Never Used   . Alcohol use No    FAMILY HISTORY:   Family History  Problem Relation Age of Onset  . Family history unknown: Yes    DRUG ALLERGIES:   Allergies  Allergen Reactions  . Iodine     Possible anaphylaxis per MD Per conversation with pt, pt denies allergy to IV contrast.  Pt injected 09/26/16 without premeds, pt with no reaction.    MEDICATIONS AT HOME:   Prior to Admission medications   Medication Sig Start Date End Date Taking? Authorizing Provider  Diphenhyd-Hydrocort-Nystatin (FIRST-DUKES MOUTHWASH) SUSP Use as directed 10 mLs in the mouth or throat 4 (four) times daily as needed. Patient not taking: Reported on 11/17/2016 05/26/16   Lequita Asal, MD  ferrous sulfate 325 (65 FE) MG EC tablet Take 1 tablet (325 mg total) by mouth daily with breakfast. Patient not taking: Reported on 11/17/2016 08/28/15   Lequita Asal, MD  gabapentin (NEURONTIN) 100 MG capsule Take 1 capsule (100 mg total) by mouth 3 (three) times daily. Patient not taking: Reported on 11/17/2016 05/15/16 05/15/17  Sindy Guadeloupe, MD  HYDROcodone-acetaminophen [facility]) 7.5-325 MG tablet Take 1 tablet by mouth every 4 (four) hours as needed for moderate pain. Patient not taking: Reported on 11/17/2016 10/28/16   Karen Kitchens, NP  HYDROmorphone (DILAUDID) 2 MG tablet Take 1 tablet (2 mg total) by mouth every 12 (twelve) hours as needed for severe pain. 11/17/16 11/17/17  Earleen Newport, MD  lactulose (CHRONULAC) 10 GM/15ML solution Take 30 mLs (20 g total) by mouth daily as needed for mild constipation. Patient not taking: Reported on 11/17/2016 06/17/16  Paulette Blanch, MD  omeprazole (PRILOSEC) 20 MG capsule Take 1 capsule (20 mg total) by mouth 2 (two) times daily before a meal. Patient not taking: Reported on 11/17/2016 03/26/16   Lequita Asal, MD  oxyCODONE-acetaminophen (PERCOCET) 7.5-325 MG tablet Take 1 tablet by mouth every 4 (four) hours as needed for severe pain. Patient not taking:  Reported on 11/17/2016 11/09/16 11/09/17  Earleen Newport, MD  potassium chloride (K-DUR) 10 MEQ tablet Take 1 tablet (10 mEq total) by mouth 2 (two) times daily. Take 1 tablet daily Patient not taking: Reported on 11/17/2016 07/17/16   Lequita Asal, MD  prochlorperazine (COMPAZINE) 10 MG tablet Take 1 tablet (10 mg total) by mouth every 6 (six) hours as needed for nausea or vomiting. Patient not taking: Reported on 11/17/2016 02/05/16   Lequita Asal, MD  promethazine (PHENERGAN) 25 MG tablet Take 1 tablet (25 mg total) by mouth every 4 (four) hours as needed for nausea or vomiting. 11/17/16   Earleen Newport, MD    REVIEW OF SYSTEMS:  Review of Systems  Constitutional: Positive for malaise/fatigue. Negative for chills, fever and weight loss.  HENT: Negative for ear pain, hearing loss and tinnitus.   Eyes: Negative for blurred vision, double vision, pain and redness.  Respiratory: Negative for cough, hemoptysis and shortness of breath.   Cardiovascular: Negative for chest pain, palpitations, orthopnea and leg swelling.  Gastrointestinal: Positive for abdominal pain, nausea and vomiting. Negative for constipation and diarrhea.  Genitourinary: Negative for dysuria, frequency and hematuria.  Musculoskeletal: Negative for back pain, joint pain and neck pain.  Skin:       No acne, rash, or lesions  Neurological: Positive for weakness. Negative for dizziness, tremors and focal weakness.  Endo/Heme/Allergies: Negative for polydipsia. Does not bruise/bleed easily.  Psychiatric/Behavioral: Negative for depression. The patient is not nervous/anxious and does not have insomnia.      VITAL SIGNS:   Vitals:   11/20/16 1853 11/20/16 1901 11/20/16 2130  BP:  127/83 125/88  Pulse:  (!) 116 96  Resp:   (!) 31  Temp:  98.9 F (37.2 C)   TempSrc:  Oral   SpO2:  99% 97%  Weight: 68 kg (150 lb)    Height: 5\' 5"  (1.651 m)     Wt Readings from Last 3 Encounters:  11/20/16 68 kg  (150 lb)  11/17/16 74.8 kg (165 lb)  11/09/16 74.8 kg (165 lb)    PHYSICAL EXAMINATION:  Physical Exam  Vitals reviewed. Constitutional: He is oriented to person, place, and time. He appears well-developed and well-nourished. No distress.  HENT:  Head: Normocephalic and atraumatic.  Mouth/Throat: Oropharynx is clear and moist.  Eyes: Pupils are equal, round, and reactive to light. Conjunctivae and EOM are normal. No scleral icterus.  Neck: Normal range of motion. Neck supple. No JVD present. No thyromegaly present.  Cardiovascular: Normal rate, regular rhythm and intact distal pulses.  Exam reveals no gallop and no friction rub.   No murmur heard. Respiratory: Effort normal and breath sounds normal. No respiratory distress. He has no wheezes. He has no rales.  GI: Soft. Bowel sounds are normal. He exhibits no distension. There is tenderness.  Musculoskeletal: Normal range of motion. He exhibits no edema.  No arthritis, no gout  Lymphadenopathy:    He has no cervical adenopathy.  Neurological: He is alert and oriented to person, place, and time. No cranial nerve deficit.  No dysarthria, no aphasia  Skin: Skin is warm  and dry. No rash noted. No erythema.  Psychiatric: He has a normal mood and affect. His behavior is normal. Judgment and thought content normal.    LABORATORY PANEL:   CBC  Recent Labs Lab 11/20/16 1856  WBC 9.9  HGB 11.7*  HCT 36.5*  PLT 340   ------------------------------------------------------------------------------------------------------------------  Chemistries   Recent Labs Lab 11/20/16 1856  NA 139  K 4.0  CL 101  CO2 24  GLUCOSE 96  BUN 11  CREATININE 0.76  CALCIUM 9.0  AST 21  ALT 9*  ALKPHOS 150*  BILITOT 1.0   ------------------------------------------------------------------------------------------------------------------  Cardiac Enzymes No results for input(s): TROPONINI in the last 168  hours. ------------------------------------------------------------------------------------------------------------------  RADIOLOGY:  Dg Chest Port 1 View  Result Date: 11/20/2016 CLINICAL DATA:  Fever. EXAM: PORTABLE CHEST 1 VIEW COMPARISON:  CT chest dated September 04, 2016. FINDINGS: Right chest wall port catheter with tip near the cavoatrial junction. The cardiomediastinal silhouette is borderline enlarged. Low lung volumes. Dense consolidation of the left lower lobe with adjacent small left pleural effusion. No pneumothorax. No acute osseous abnormality. IMPRESSION: Findings concerning for left lower lobe pneumonia with adjacent small pleural effusion. Electronically Signed   By: Titus Dubin M.D.   On: 11/20/2016 21:53    EKG:   Orders placed or performed during the hospital encounter of 11/17/16  . EKG 12-Lead  . EKG 12-Lead  . ED EKG  . ED EKG  . EKG    IMPRESSION AND PLAN:  Principal Problem:   Sepsis ([place]) - IV antibiotics, lactic acid within normal limits, blood pressure stable, cultures sent from the ED, most likely source is pneumonia in the urine studies are still pending. Active Problems:   HCAP (healthcare-associated pneumonia) - IV antibiotics and other treatment as above   Malignant neoplasm of body of stomach ([place]) - predisposes patient to infection   Malignant ascites - ultrasound-guided paracentesis ordered for fluid extraction and evaluation for possible intra-abdominal infection   Chronic peptic ulcer of stomach - PPI  All the records are reviewed and case discussed with ED provider. Management plans discussed with the patient and/or family.  DVT PROPHYLAXIS: SubQ lovenox  GI PROPHYLAXIS: PPI  ADMISSION STATUS: Inpatient  CODE STATUS: Full Code Status History    This patient does not have a recorded code status. Please follow your organizational policy for patients in this situation.      TOTAL TIME TAKING CARE OF THIS PATIENT: 45 minutes.    Nubia Ziesmer [place] 11/20/2016, 9:57 PM  [employer]  [phone]  CC: Primary care physician; Patient, No Pcp Per  Note:  This document was prepared using Dragon voice recognition software and may include unintentional dictation errors.

## 2016-11-20 NOTE — ED Triage Notes (Signed)
Pt reports that he has had N/V today. He reports that when he eats something it comes right back up. VSS. NAD. Color within norma limits. RR even and non labored.

## 2016-11-20 NOTE — ED Provider Notes (Signed)
[facility] Emergency Department Provider Note  ____________________________________________   First MD Initiated Contact with Patient 11/20/16 2041     (approximate)  I have reviewed the triage vital signs and the nursing notes.   HISTORY  Chief Complaint Abdominal Pain   HPI Anthony Melendez is a 36 y.o. male who self presents to the emergency department with several days of progressive severe abdominal pain and fullness and discomfort. 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 She last received chemotherapy 2 weeks ago. He's had subjective fever but no chills. He's had nausea and vomiting particularly when attempting to eat anything. He denies chest pain shortness of breath or cough. Serious onset and have been slowly progressive. Nothing in particular seems to make them better or worse.   Past Medical History:  Diagnosis Date  . Gastric cancer ([place]) 08/2015   Folfox chemo tx's.  Marland Kitchen GERD (gastroesophageal reflux disease)     Patient Active Problem List   Diagnosis Date Noted  . Sepsis ([place]) 11/20/2016  . Malignant ascites 09/30/2016  . Encounter for antineoplastic immunotherapy 09/09/2016  . Cancer related pain 09/02/2016  . Chemotherapy-induced neutropenia ([place]) 07/21/2016  . Goals of care, counseling/discussion 05/29/2016  . Urticaria 04/22/2016  . 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 Chronic peptic ulcer of stomach   . Encounter for antineoplastic chemotherapy 01/15/2016  . 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 Chronic gastric ulcer 10/28/2015  . Hypocalcemia 09/30/2015  . Iron deficiency anemia 08/28/2015  . Microcytic red blood cells 08/14/2015  . Hypokalemia 08/14/2015  . Malignant neoplasm of body of stomach ([place]) 08/07/2015  . Helicobacter pylori gastritis 08/02/2015  . Abnormal  findings-gastrointestinal tract   . Neoplasm of digestive system   . Gastric wall thickening 08/01/2015  . Peritoneal carcinomatosis ([place]) 08/01/2015    Past Surgical History:  Procedure Laterality Date  . ESOPHAGOGASTRODUODENOSCOPY (EGD) WITH PROPOFOL N/A 08/02/2015   Procedure: ESOPHAGOGASTRODUODENOSCOPY (EGD) WITH PROPOFOL;  Surgeon: Lucilla Lame, MD;  Location: [facility];  Service: Endoscopy;  Laterality: N/A;  . ESOPHAGOGASTRODUODENOSCOPY (EGD) WITH PROPOFOL N/A 02/26/2016   Procedure: ESOPHAGOGASTRODUODENOSCOPY (EGD) WITH PROPOFOL;  Surgeon: Lucilla Lame, MD;  Location: [facility] ENDOSCOPY;  Service: Endoscopy;  Laterality: N/A;  . NO PAST SURGERIES    . PERIPHERAL VASCULAR CATHETERIZATION N/A 08/13/2015   Procedure: Glori Luis Cath Insertion;  Surgeon: Algernon Huxley, MD;  Location: [facility];  Service: Cardiovascular;  Laterality: N/A;    Prior to Admission medications   Medication Sig Start Date End Date Taking? Authorizing Provider  HYDROmorphone (DILAUDID) 2 MG tablet Take 1 tablet (2 mg total) by mouth every 12 (twelve) hours as needed for severe pain. 11/17/16 11/17/17 Yes Earleen Newport, MD  promethazine (PHENERGAN) 25 MG tablet Take 1 tablet (25 mg total) by mouth every 4 (four) hours as needed for nausea or vomiting. 11/17/16  Yes Earleen Newport, MD    Allergies Iodine  Family History  Problem Relation Age of Onset  . Family history unknown: Yes    Social History Social History  Substance Use Topics  . Smoking status: Never Smoker  . Smokeless tobacco: Never Used  . Alcohol use No    Review of Systems Constitutional: positive for fever Eyes: No visual changes. ENT: No sore throat. Cardiovascular: Denies chest pain. Respiratory: Denies shortness of breath. Gastrointestinal: positive for abdominal pain.  positive for nausea, positive for vomiting.  No diarrhea.  No constipation. Genitourinary: Negative for dysuria. Musculoskeletal: Negative  for back pain. Skin: Negative for rash. Neurological: Negative for headaches, focal weakness or numbness.   ____________________________________________   PHYSICAL EXAM:  VITAL SIGNS: ED Triage Vitals  Enc Vitals Group     BP 11/20/16 1901 127/83     Pulse Rate 11/20/16 1901 (!) 116     Resp --      Temp 11/20/16 1901 98.9 F (37.2 C)     Temp Source 11/20/16 1901 Oral     SpO2 11/20/16 1901 99 %     Weight 11/20/16 1853 150 lb (68 kg)     Height 11/20/16 1853 '5\' 5"'$  (1.651 m)     Head Circumference --      Peak Flow --      Pain Score --      Pain Loc --      Pain Edu? --      Excl. in [place]? --     Constitutional: alert and oriented 4 appears somewhat comfortable nontoxic no diaphoresis speaks in full clear sentences Eyes: PERRL EOMI. Head: Atraumatic. Nose: No congestion/rhinnorhea. Mouth/Throat: No trismus Neck: No stridor.   Cardiovascular: tachycardic rate, regular rhythm. Grossly normal heart sounds.  Good peripheral circulation. Respiratory: Normal respiratory effort.  No retractions. Lungs CTAB and moving good air Gastrointestinal: diffusely tender abdomen with no frank peritonitis his abdominal wall as firm Musculoskeletal: No lower extremity edema   Neurologic:  Normal speech and language. No gross focal neurologic deficits are appreciated. Skin:  Skin is warm, dry and intact. No rash noted. Psychiatric: Mood and affect are normal. Speech and behavior are normal.    ____________________________________________   DIFFERENTIAL includes but not limited to  sepsis, urinary tract infection, pneumonia, spontaneous bacterial peritonitis ____________________________________________   LABS (all labs ordered are listed, but only abnormal results are displayed)  Labs Reviewed  COMPREHENSIVE METABOLIC PANEL - Abnormal; Notable for the following:       Result Value   Albumin 3.2 (*)    ALT 9 (*)    Alkaline Phosphatase 150 (*)    All other components within  normal limits  CBC - Abnormal; Notable for the following:    Hemoglobin 11.7 (*)    HCT 36.5 (*)    MCV 76.9 (*)    MCH 24.6 (*)    RDW 15.4 (*)    All other components within normal limits  CULTURE, BLOOD (ROUTINE X 2)  CULTURE, BLOOD (ROUTINE X 2)  LIPASE, BLOOD  LACTIC ACID, PLASMA  URINALYSIS, COMPLETE (UACMP) WITH MICROSCOPIC  HIV ANTIBODY (ROUTINE TESTING)  BASIC METABOLIC PANEL  CBC    blood work reviewed interpreted by me shows a low albumin consistent with chronic starvation __________________________________________  EKG   ____________________________________________  RADIOLOGY  chest x-ray were reviewed by me shows possible left lower lobe pneumonia ____________________________________________   PROCEDURES  Procedure(s) performed: no  Procedures  Critical Care performed: yes  CRITICAL CARE Performed by: Darel Hong   Total critical care time: 35 minutes  Critical care time was exclusive of separately billable procedures and treating other patients.  Critical care was necessary to treat or prevent imminent or life-threatening deterioration.  Critical care was time spent personally by me on the following activities: development of treatment plan with patient and/or surrogate as well as nursing, discussions with consultants, evaluation of patient's response to treatment, examination of patient, obtaining history from patient or surrogate, ordering and performing treatments and interventions, ordering and review  of laboratory studies, ordering and review of radiographic studies, pulse oximetry and re-evaluation of patient's condition.   Observation: no ____________________________________________   INITIAL IMPRESSION / ASSESSMENT AND PLAN / ED COURSE  Pertinent labs & imaging results that were available during my care of the patient were reviewed by me and considered in my medical decision making (see chart for details).  On arrival the patient  is tachycardic and uncomfortable appearing. I appreciate that his oral temperature was normal, however he felt warm to me so I checked a rectal temperature which was 100.7 concerning for abdominal sepsis. I performed a bedside ultrasound which did confirm ascites but not enough for me to comfortably . At this point I will cover him with ceftriaxone and Flagyl and give him aggressive IV fluids as well as IV analgesia. He requires inpatient admission for likely interventional radiology guided paracentesis.  the patient and his family verbalized understanding and agreement with the plan.     ____________________________________________   FINAL CLINICAL IMPRESSION(S) / ED DIAGNOSES  Final diagnoses:  Sepsis, due to unspecified organism [facility])      NEW MEDICATIONS STARTED DURING THIS VISIT:  Current Discharge Medication List       Note:  This document was prepared using Dragon voice recognition software and may include unintentional dictation errors.     Darel Hong, MD 11/20/16 [phone]

## 2016-11-21 ENCOUNTER — Inpatient Hospital Stay: Payer: 59

## 2016-11-21 DIAGNOSIS — J189 Pneumonia, unspecified organism: Secondary | ICD-10-CM | POA: Diagnosis present

## 2016-11-21 LAB — BODY FLUID CELL COUNT WITH DIFFERENTIAL
Eos, Fluid: 0 %
Lymphs, Fluid: 10 %
MONOCYTE-MACROPHAGE-SEROUS FLUID: 9 %
Neutrophil Count, Fluid: 81 %
WBC FLUID: 117 uL

## 2016-11-21 LAB — URINALYSIS, COMPLETE (UACMP) WITH MICROSCOPIC
BILIRUBIN URINE: NEGATIVE
GLUCOSE, UA: NEGATIVE mg/dL
Hgb urine dipstick: NEGATIVE
KETONES UR: 80 mg/dL — AB
Leukocytes, UA: NEGATIVE
Nitrite: NEGATIVE
PROTEIN: 100 mg/dL — AB
Specific Gravity, Urine: 1.032 — ABNORMAL HIGH (ref 1.005–1.030)
pH: 5 (ref 5.0–8.0)

## 2016-11-21 LAB — BASIC METABOLIC PANEL
Anion gap: 12 (ref 5–15)
BUN: 9 mg/dL (ref 6–20)
CHLORIDE: 103 mmol/L (ref 101–111)
CO2: 24 mmol/L (ref 22–32)
CREATININE: 0.56 mg/dL — AB (ref 0.61–1.24)
Calcium: 7.9 mg/dL — ABNORMAL LOW (ref 8.9–10.3)
GFR calc non Af Amer: >60 mL/min (ref >60)
Glucose, Bld: 103 mg/dL — ABNORMAL HIGH (ref 65–99)
Potassium: 3.2 mmol/L — ABNORMAL LOW (ref 3.5–5.1)
SODIUM: 139 mmol/L (ref 135–145)

## 2016-11-21 LAB — CBC
HCT: 29.6 % — ABNORMAL LOW (ref 40.0–52.0)
Hemoglobin: 10 g/dL — ABNORMAL LOW (ref 13.0–18.0)
MCH: 25.7 pg — ABNORMAL LOW (ref 26.0–34.0)
MCHC: 33.8 g/dL (ref 32.0–36.0)
MCV: 76 fL — AB (ref 80.0–100.0)
PLATELETS: 277 10*3/uL (ref 150–440)
RBC: 3.9 MIL/uL — AB (ref 4.40–5.90)
RDW: 15.6 % — ABNORMAL HIGH (ref 11.5–14.5)
WBC: 8.8 10*3/uL (ref 3.8–10.6)

## 2016-11-21 LAB — PATHOLOGIST SMEAR REVIEW

## 2016-11-21 LAB — GLUCOSE, PLEURAL OR PERITONEAL FLUID: Glucose, Fluid: 22 mg/dL

## 2016-11-21 MED ORDER — DEXTROSE 5 % IV SOLN
1.0000 g | INTRAVENOUS | Status: DC
  Administered 2016-11-21: 1 g via INTRAVENOUS
  Filled 2016-11-21 (×2): qty 10

## 2016-11-21 MED ORDER — SODIUM CHLORIDE 0.9 % IV SOLN
INTRAVENOUS | Status: DC
  Administered 2016-11-21 – 2016-11-22 (×2): via INTRAVENOUS

## 2016-11-21 MED ORDER — VANCOMYCIN HCL IN DEXTROSE 1-5 GM/200ML-% IV SOLN
1000.0000 mg | Freq: Three times a day (TID) | INTRAVENOUS | Status: DC
  Administered 2016-11-21 (×2): 1000 mg via INTRAVENOUS
  Filled 2016-11-21 (×3): qty 200

## 2016-11-21 MED ORDER — METOCLOPRAMIDE HCL 5 MG/ML IJ SOLN
10.0000 mg | Freq: Three times a day (TID) | INTRAMUSCULAR | Status: DC
  Administered 2016-11-21 – 2016-11-22 (×4): 10 mg via INTRAVENOUS
  Filled 2016-11-21 (×4): qty 2

## 2016-11-21 MED ORDER — CALCIUM CARBONATE ANTACID 500 MG PO CHEW
1.0000 | CHEWABLE_TABLET | ORAL | Status: DC | PRN
  Administered 2016-11-21: 400 mg via ORAL
  Filled 2016-11-21 (×2): qty 1

## 2016-11-21 MED ORDER — DEXTROSE 5 % IV SOLN
500.0000 mg | INTRAVENOUS | Status: DC
  Administered 2016-11-21 – 2016-11-24 (×4): 500 mg via INTRAVENOUS
  Filled 2016-11-21 (×5): qty 500

## 2016-11-21 MED ORDER — POTASSIUM CHLORIDE CRYS ER 20 MEQ PO TBCR
40.0000 meq | EXTENDED_RELEASE_TABLET | Freq: Once | ORAL | Status: AC
  Administered 2016-11-21: 40 meq via ORAL
  Filled 2016-11-21: qty 2

## 2016-11-21 MED ORDER — ALUM & MAG HYDROXIDE-SIMETH 200-200-20 MG/5ML PO SUSP
30.0000 mL | Freq: Four times a day (QID) | ORAL | Status: DC | PRN
  Administered 2016-11-23 – 2016-11-26 (×6): 30 mL via ORAL
  Filled 2016-11-21 (×7): qty 30

## 2016-11-21 MED ORDER — PANTOPRAZOLE SODIUM 40 MG PO TBEC
40.0000 mg | DELAYED_RELEASE_TABLET | Freq: Every day | ORAL | Status: DC
  Administered 2016-11-21 – 2016-11-23 (×3): 40 mg via ORAL
  Filled 2016-11-21 (×3): qty 1

## 2016-11-21 MED ORDER — ENSURE ENLIVE PO LIQD
237.0000 mL | Freq: Two times a day (BID) | ORAL | Status: DC
  Administered 2016-11-21 – 2016-11-28 (×6): 237 mL via ORAL

## 2016-11-21 MED ORDER — CALCIUM CARBONATE ANTACID 500 MG PO CHEW
1.0000 | CHEWABLE_TABLET | Freq: Three times a day (TID) | ORAL | Status: DC
  Administered 2016-11-21 – 2016-12-03 (×16): 200 mg via ORAL
  Filled 2016-11-21 (×22): qty 1

## 2016-11-21 NOTE — Progress Notes (Signed)
Pharmacy Antibiotic Note  Anthony Melendez is a 37 y.o. male admitted on 11/20/2016 with pneumonia.  Pharmacy has been consulted for ceftriaxone dosing. Patient is also ordered azithromycin.  Plan: Ceftriaxone 1 g iv q 24 hours.   Height: 5\' 5"  (165.1 cm) Weight: 163 lb 6.4 oz (74.1 kg) IBW/kg (Calculated) : 61.5  Temp (24hrs), Avg:99.1 F (37.3 C), Min:98.9 F (37.2 C), Max:99.6 F (37.6 C)   Recent Labs Lab 11/17/16 1345 11/20/16 1856 11/20/16 2056 11/21/16 0405  WBC 10.0 9.9  --  8.8  CREATININE 0.68 0.76  --  0.56*  LATICACIDVEN  --   --  1.2  --     Estimated Creatinine Clearance: 117.8 mL/min (A) (by C-G formula based on SCr of 0.56 mg/dL (L)).    Allergies  Allergen Reactions  . Iodine     Possible anaphylaxis per MD Per conversation with pt, pt denies allergy to IV contrast.  Pt injected 09/26/16 without premeds, pt with no reaction.    Antimicrobials this admission: Metronidazole 10/18 x 1 Meropenem 10/19 >> 10/19 Vancomycin 10/18 >> 10/19 CTX 10/18 >>  azithromycin 10/19 >>   Dose adjustments this admission:  Microbiology results: 10/19 BCx: NGTD 10/19 peritoneal fluid: sent  Thank you for allowing pharmacy to be a part of this patient's care.  Ulice Dash D 11/21/2016 4:18 PM

## 2016-11-21 NOTE — Progress Notes (Signed)
[place] [facility] [place] NAME: Anthony Melendez    MR#:  [id number]  DATE OF BIRTH:  05-18-1980  SUBJECTIVE:  CHIEF COMPLAINT:   Chief Complaint  Patient presents with  . Abdominal Pain   -  Patient with gastric adenocarcinoma with metastatic malignant ascites, admitted with abdominal pain and nausea -Noted to have pneumonia. Paracentesis done and 600 cc removed today. -Still complains of significant nausea  REVIEW OF SYSTEMS:  Review of Systems  Constitutional: Positive for malaise/fatigue and weight loss. Negative for chills and fever.  HENT: Negative for congestion, ear discharge, hearing loss and nosebleeds.   Eyes: Negative for blurred vision and double vision.  Respiratory: Negative for cough, shortness of breath and wheezing.   Cardiovascular: Negative for chest pain, palpitations and leg swelling.  Gastrointestinal: Positive for nausea. Negative for abdominal pain, constipation, diarrhea and vomiting.  Genitourinary: Negative for dysuria and urgency.  Musculoskeletal: Positive for back pain. Negative for myalgias.  Neurological: Negative for dizziness, seizures and headaches.  Psychiatric/Behavioral: Negative for depression.    DRUG ALLERGIES:   Allergies  Allergen Reactions  . Iodine     Possible anaphylaxis per MD Per conversation with pt, pt denies allergy to IV contrast.  Pt injected 09/26/16 without premeds, pt with no reaction.    VITALS:  Blood pressure 107/72, pulse 93, temperature 98.9 F (37.2 C), temperature source Oral, resp. rate 18, height 5\' 5"  (1.651 m), weight 74.1 kg (163 lb 6.4 oz), SpO2 93 %.  PHYSICAL EXAMINATION:  Physical Exam  GENERAL:  36 y.o.-year-old Well-nourished patient lying in the bed with no acute distress.  EYES: Pupils equal, round, reactive to light and accommodation. No scleral icterus. Extraocular muscles intact.  HEENT: Head atraumatic, normocephalic. Oropharynx and nasopharynx  clear.  NECK:  Supple, no jugular venous distention. No thyroid enlargement, no tenderness.  LUNGS: Normal breath sounds bilaterally, no wheezing, rales,rhonchi or crepitation. No use of accessory muscles of respiration. Decreased bibasilar breath sounds CARDIOVASCULAR: S1, S2 normal. No murmurs, rubs, or gallops.  ABDOMEN: Soft, nontender, distended and tight. Hypoactive Bowel sounds present. No organomegaly or mass.  EXTREMITIES: No pedal edema, cyanosis, or clubbing.  NEUROLOGIC: Cranial nerves II through XII are intact. Muscle strength 5/5 in all extremities. Sensation intact. Gait not checked.  PSYCHIATRIC: The patient is alert and oriented x 3.  SKIN: No obvious rash, lesion, or ulcer.    LABORATORY PANEL:   CBC  Recent Labs Lab 11/21/16 0405  WBC 8.8  HGB 10.0*  HCT 29.6*  PLT 277   ------------------------------------------------------------------------------------------------------------------  Chemistries   Recent Labs Lab 11/20/16 1856 11/21/16 0405  NA 139 139  K 4.0 3.2*  CL 101 103  CO2 24 24  GLUCOSE 96 103*  BUN 11 9  CREATININE 0.76 0.56*  CALCIUM 9.0 7.9*  AST 21  --   ALT 9*  --   ALKPHOS 150*  --   BILITOT 1.0  --    ------------------------------------------------------------------------------------------------------------------  Cardiac Enzymes No results for input(s): TROPONINI in the last 168 hours. ------------------------------------------------------------------------------------------------------------------  RADIOLOGY:  US Paracentesis  Result Date: 11/21/2016 INDICATION: Abdominal discomfort. History of ascites. Request diagnostic and therapeutic paracentesis. EXAM: ULTRASOUND GUIDED RIGHT UPPER QUADRANT PARACENTESIS MEDICATIONS: None. COMPLICATIONS: None immediate. PROCEDURE: Informed written consent was obtained from the patient after a discussion of the risks, benefits and alternatives to treatment. A timeout was performed prior  to the initiation of the procedure. Initial ultrasound scanning demonstrates a small to moderate amount of  densely loculated ascites within the right upper abdominal quadrant. The right upper abdomen was prepped and draped in the usual sterile fashion. 1% lidocaine with epinephrine was used for local anesthesia. Following this, a Safe-T-Centesis catheter was introduced. An ultrasound image was saved for documentation purposes. The paracentesis was performed. The catheter was removed and a dressing was applied. The patient tolerated the procedure well without immediate post procedural complication. FINDINGS: A total of approximately 660 mL of hazy, blood-tinged fluid was removed. Samples were sent to the laboratory as requested by the clinical team. IMPRESSION: Densely loculated pocket of ascites in the right upper quadrant. Successful ultrasound-guided paracentesis yielding 660 mL of peritoneal fluid. Read by: Ascencion Dike PA-C Electronically Signed   By: Sandi Mariscal M.D.   On: 11/21/2016 10:32   Dg Chest Port 1 View  Result Date: 11/20/2016 CLINICAL DATA:  Fever. EXAM: PORTABLE CHEST 1 VIEW COMPARISON:  CT chest dated September 04, 2016. FINDINGS: Right chest wall port catheter with tip near the cavoatrial junction. The cardiomediastinal silhouette is borderline enlarged. Low lung volumes. Dense consolidation of the left lower lobe with adjacent small left pleural effusion. No pneumothorax. No acute osseous abnormality. IMPRESSION: Findings concerning for left lower lobe pneumonia with adjacent small pleural effusion. Electronically Signed   By: Titus Dubin M.D.   On: 11/20/2016 21:53    EKG:   Orders placed or performed during the hospital encounter of 11/17/16  . EKG 12-Lead  . EKG 12-Lead  . ED EKG  . ED EKG  . EKG    ASSESSMENT AND PLAN:   36 year old male with past medical history significant for metastatic gastric cancer status post large volume paracentesis presents to hospital secondary  to abdominal pain and nausea.  #1 community-acquired pneumonia-left lower lobe infiltrate noted on chest x-ray. -Blood cultures are pending. Change his antibiotics to Rocephin and azithromycin.  #2 abdominal pain and nausea-has had intermittent abdominal pain associated with nausea likely secondary to his underlying malignancy. -Continue when necessary Percocet and outpatient follow-up -Prior large-volume paracentesis, however paracentesis attempted to 3 weeks ago at [facility] emergency room and no fluid could be removed. Today only 600 cc removed  #3 weight loss/failure to thrive-and able to eat due to increased abdominal pain and nausea -Started Reglan prior to meals while in the hospital. -Ensure with meals.  #4 hypokalemia-being replaced  #5 metastatic gastric carcinoma-diagnosed in June 2017, metastases to the omentum and also malignant ascites. -Follows up with Dr. Mike Gip, now seeing [facility] for a clinical trial to be started next week  #6 DVT prophylaxis- lovenox    All the records are reviewed and case discussed with Care Management/Social Workerr. Management plans discussed with the patient, family and they are in agreement.  CODE STATUS: Full code  TOTAL TIME TAKING CARE OF THIS PATIENT: 38 minutes.   POSSIBLE D/C tomorrow, DEPENDING ON CLINICAL CONDITION.   Kortnee Bas M.D on 11/21/2016 at 3:04 PM  Between 7am to 6pm - Pager - [phone]  After 6pm go to [email or web address] - password [email or web address] [facility]  Office  [phone]  CC: Primary care physician; Patient, No Pcp Per

## 2016-11-21 NOTE — Progress Notes (Signed)
Pharmacy Antibiotic Note  Anthony Melendez is a 36 y.o. male admitted on 11/20/2016 with sepsis.  Pharmacy has been consulted for vancomycin and meropenem dosing.  Plan: DW 68 kg  Vd 48L kei 0.095 hr-1  T1/2 7 hours Vancomycin 1 gram q 8 hours ordered with stacked dosing. Level before 5th dose. Goal trough 15-20  Meropenem 1 gram q 8 hours ordered.  Height: 5\' 5"  (165.1 cm) Weight: 150 lb (68 kg) IBW/kg (Calculated) : 61.5  Temp (24hrs), Avg:99.3 F (37.4 C), Min:98.9 F (37.2 C), Max:99.6 F (37.6 C)   Recent Labs Lab 11/17/16 1345 11/20/16 1856 11/20/16 2056  WBC 10.0 9.9  --   CREATININE 0.68 0.76  --   LATICACIDVEN  --   --  1.2    Estimated Creatinine Clearance: 108.9 mL/min (by C-G formula based on SCr of 0.76 mg/dL).    Allergies  Allergen Reactions  . Iodine     Possible anaphylaxis per MD Per conversation with pt, pt denies allergy to IV contrast.  Pt injected 09/26/16 without premeds, pt with no reaction.    Antimicrobials this admission: Ceftriaxone, metronidazole x1; Vancomycin, meropenem 10/19  >>    >>   Dose adjustments this admission:   Microbiology results: 10/18 BCx: pending      10/18 UA: pending 10/18 CXR: Findings concerning for left lower lobe pneumonia  Thank you for allowing pharmacy to be a part of this patient's care.  Iban Utz S 11/21/2016 12:47 AM

## 2016-11-21 NOTE — Progress Notes (Signed)
Initial Nutrition Assessment  DOCUMENTATION CODES:   Not applicable  INTERVENTION:  Patient unable to receive adequate nutrition for 1 week.  Recommend post pyloric NG tube.  Would start with trickle feeds of specialty formula such as vital high protein.  This may not be in line with patient's GOC given Stg IV Gastric Cancer, on palliative treatment.  Continue Ensure Enlive po BID, each supplement provides 350 kcal and 20 grams of protein  NUTRITION DIAGNOSIS:   Increased nutrient needs related to chronic illness as evidenced by estimated needs.  GOAL:   Patient will meet greater than or equal to 90% of their needs  MONITOR:   PO intake, I & O's, Labs, Weight trends  REASON FOR ASSESSMENT:   Malnutrition Screening Tool    ASSESSMENT:   Mr. Anthony Melendez is a 36 yo male with PMH of STG IV gastric cancer with malignant ascites and peritoneal carcinomatosis, transitioned from standard chemo to trial chemo at [facility]. Presents with abd pain, nausea,vomiting, weight loss, HCAP. Underwent paracentesis today, 623mL fluid pulled. Patient was due to start this trial on the 15th, but was sent to ED instead. 17 pound/9.4% severe weight loss over 2 months.  Spoke with patient and sister at bedside. He reports not eating well over the past couple weeks with a 7 pound/4.1% severe weight loss during that time. He states for the past week he has been unable to keep anything down with nausea and vomiting. Normally eats a biscuit in the morning, chicken or fish with tortillas for lunch and dinner. Patient did not provide much detail, difficult to assess adequate PO intake. Per care everywhere, patient exhibits a 49 pound/23% severe weight loss since June. Still having nausea/vomiting today, did not eat anything. Was NPO until 1145. NFPE WNL  3rd cycle of pembrolizumab   Labs reviewed:  K 3.2 Medications reviewed Reglan NS at 123mL/hr  Diet Order:  Diet regular Room service appropriate? Yes; Fluid  consistency: Thin  Skin:  Reviewed, no issues  Last BM:  PTA  Height:   Ht Readings from Last 1 Encounters:  11/20/16 5\' 5"  (1.651 m)    Weight:   Wt Readings from Last 1 Encounters:  11/21/16 163 lb 6.4 oz (74.1 kg)    Ideal Body Weight:  61.81 kg  BMI:  Body mass index is 27.19 kg/m.  Estimated Nutritional Needs:   Kcal:  2222-2600 calories (30-35 cal/kg)  Protein:  96-126 grams (1.3-1.7g/kg)  Fluid:  2.2-2.6L  EDUCATION NEEDS:   No education needs identified at this time  Anthony Melendez. Hellon Vaccarella, MS, RD LDN Inpatient Clinical Dietitian Pager [phone]

## 2016-11-21 NOTE — Progress Notes (Signed)
[facility] responded to an OR for an AD. Pt is having a procedure and it is uncertain when she will be back. [place] left the document with the RN who will PG when the Pt is awake, unmedicated, and ready for education. [facility] is available for follow up as needed.    11/21/16 0900  Clinical Encounter Type  Visited With Health care provider  Visit Type Initial;Spiritual support  Referral From Nurse  Consult/Referral To Chaplain  Spiritual Encounters  Spiritual Needs Literature

## 2016-11-21 NOTE — Procedures (Signed)
PROCEDURE SUMMARY:  Densely loculated ascites in RUQ Successful US guided paracentesis from RUQ.  Yielded 660 mL of blood tinged fluid.  No immediate complications.  Pt tolerated well.   Specimen was sent for labs.  Ascencion Dike PA-C 11/21/2016 10:26 AM

## 2016-11-22 ENCOUNTER — Encounter: Payer: Self-pay | Admitting: Radiology

## 2016-11-22 ENCOUNTER — Inpatient Hospital Stay: Payer: 59

## 2016-11-22 DIAGNOSIS — R531 Weakness: Secondary | ICD-10-CM

## 2016-11-22 DIAGNOSIS — R5383 Other fatigue: Secondary | ICD-10-CM

## 2016-11-22 DIAGNOSIS — Z8711 Personal history of peptic ulcer disease: Secondary | ICD-10-CM

## 2016-11-22 DIAGNOSIS — Z79899 Other long term (current) drug therapy: Secondary | ICD-10-CM

## 2016-11-22 DIAGNOSIS — J189 Pneumonia, unspecified organism: Secondary | ICD-10-CM

## 2016-11-22 DIAGNOSIS — R112 Nausea with vomiting, unspecified: Secondary | ICD-10-CM

## 2016-11-22 DIAGNOSIS — Z9221 Personal history of antineoplastic chemotherapy: Secondary | ICD-10-CM

## 2016-11-22 DIAGNOSIS — D701 Agranulocytosis secondary to cancer chemotherapy: Secondary | ICD-10-CM

## 2016-11-22 DIAGNOSIS — C786 Secondary malignant neoplasm of retroperitoneum and peritoneum: Secondary | ICD-10-CM

## 2016-11-22 DIAGNOSIS — K219 Gastro-esophageal reflux disease without esophagitis: Secondary | ICD-10-CM

## 2016-11-22 DIAGNOSIS — T451X5A Adverse effect of antineoplastic and immunosuppressive drugs, initial encounter: Secondary | ICD-10-CM

## 2016-11-22 DIAGNOSIS — C160 Malignant neoplasm of cardia: Secondary | ICD-10-CM

## 2016-11-22 DIAGNOSIS — R180 Malignant ascites: Secondary | ICD-10-CM

## 2016-11-22 DIAGNOSIS — Z8619 Personal history of other infectious and parasitic diseases: Secondary | ICD-10-CM

## 2016-11-22 DIAGNOSIS — R634 Abnormal weight loss: Secondary | ICD-10-CM

## 2016-11-22 DIAGNOSIS — R197 Diarrhea, unspecified: Secondary | ICD-10-CM

## 2016-11-22 DIAGNOSIS — G893 Neoplasm related pain (acute) (chronic): Secondary | ICD-10-CM

## 2016-11-22 DIAGNOSIS — G4700 Insomnia, unspecified: Secondary | ICD-10-CM

## 2016-11-22 DIAGNOSIS — D509 Iron deficiency anemia, unspecified: Secondary | ICD-10-CM

## 2016-11-22 DIAGNOSIS — A419 Sepsis, unspecified organism: Principal | ICD-10-CM

## 2016-11-22 LAB — BASIC METABOLIC PANEL
Anion gap: 13 (ref 5–15)
BUN: 6 mg/dL (ref 6–20)
CHLORIDE: 103 mmol/L (ref 101–111)
CO2: 24 mmol/L (ref 22–32)
Calcium: 8 mg/dL — ABNORMAL LOW (ref 8.9–10.3)
Creatinine, Ser: 0.59 mg/dL — ABNORMAL LOW (ref 0.61–1.24)
GFR calc Af Amer: >60 mL/min (ref >60)
GLUCOSE: 88 mg/dL (ref 65–99)
POTASSIUM: 3.1 mmol/L — AB (ref 3.5–5.1)
Sodium: 140 mmol/L (ref 135–145)

## 2016-11-22 LAB — MAGNESIUM: Magnesium: 1.5 mg/dL — ABNORMAL LOW (ref 1.7–2.4)

## 2016-11-22 LAB — HIV ANTIBODY (ROUTINE TESTING W REFLEX): HIV SCREEN 4TH GENERATION: NONREACTIVE

## 2016-11-22 MED ORDER — POTASSIUM CHLORIDE 10 MEQ/100ML IV SOLN
10.0000 meq | INTRAVENOUS | Status: AC
  Administered 2016-11-22 (×3): 10 meq via INTRAVENOUS
  Filled 2016-11-22 (×3): qty 100

## 2016-11-22 MED ORDER — POTASSIUM CHLORIDE CRYS ER 20 MEQ PO TBCR
40.0000 meq | EXTENDED_RELEASE_TABLET | ORAL | Status: DC
  Administered 2016-11-22: 40 meq via ORAL
  Filled 2016-11-22: qty 2

## 2016-11-22 MED ORDER — MAGNESIUM SULFATE 2 GM/50ML IV SOLN
2.0000 g | Freq: Once | INTRAVENOUS | Status: AC
  Administered 2016-11-22: 2 g via INTRAVENOUS
  Filled 2016-11-22: qty 50

## 2016-11-22 MED ORDER — CALCIUM CARBONATE ANTACID 500 MG PO CHEW
1.0000 | CHEWABLE_TABLET | Freq: Once | ORAL | Status: DC
  Filled 2016-11-22: qty 1

## 2016-11-22 MED ORDER — IOPAMIDOL (ISOVUE-300) INJECTION 61%: 15.0000 mL | INTRAVENOUS | Status: AC

## 2016-11-22 MED ORDER — IOPAMIDOL (ISOVUE-300) INJECTION 61%
100.0000 mL | Freq: Once | INTRAVENOUS | Status: AC | PRN
  Administered 2016-11-22: 100 mL via INTRAVENOUS

## 2016-11-22 MED ORDER — OLANZAPINE 10 MG PO TABS
10.0000 mg | ORAL_TABLET | Freq: Every day | ORAL | Status: DC
  Administered 2016-11-22 – 2016-11-24 (×3): 10 mg via ORAL
  Filled 2016-11-22 (×2): qty 1
  Filled 2016-11-22: qty 2
  Filled 2016-11-22 (×5): qty 1
  Filled 2016-11-22: qty 2
  Filled 2016-11-22 (×2): qty 1
  Filled 2016-11-22: qty 2
  Filled 2016-11-22: qty 1

## 2016-11-22 MED ORDER — PROCHLORPERAZINE EDISYLATE 5 MG/ML IJ SOLN
10.0000 mg | Freq: Four times a day (QID) | INTRAMUSCULAR | Status: AC
  Administered 2016-11-22 – 2016-11-24 (×8): 10 mg via INTRAVENOUS
  Filled 2016-11-22 (×8): qty 2

## 2016-11-22 MED ORDER — POTASSIUM CHLORIDE IN NACL 20-0.9 MEQ/L-% IV SOLN
INTRAVENOUS | Status: DC
  Administered 2016-11-22 – 2016-12-03 (×16): via INTRAVENOUS
  Filled 2016-11-22 (×23): qty 1000

## 2016-11-22 MED ORDER — LORAZEPAM 2 MG/ML IJ SOLN
1.0000 mg | Freq: Once | INTRAMUSCULAR | Status: AC
  Administered 2016-11-22: 1 mg via INTRAVENOUS
  Filled 2016-11-22: qty 1

## 2016-11-22 MED ORDER — ZOLPIDEM TARTRATE 5 MG PO TABS
5.0000 mg | ORAL_TABLET | Freq: Every evening | ORAL | Status: DC | PRN
  Administered 2016-11-22 – 2016-11-24 (×2): 5 mg via ORAL
  Filled 2016-11-22 (×4): qty 1

## 2016-11-22 MED ORDER — PIPERACILLIN-TAZOBACTAM 3.375 G IVPB
3.3750 g | Freq: Three times a day (TID) | INTRAVENOUS | Status: AC
  Administered 2016-11-22 – 2016-11-28 (×20): 3.375 g via INTRAVENOUS
  Filled 2016-11-22 (×18): qty 50

## 2016-11-22 MED ORDER — IOPAMIDOL (ISOVUE-300) INJECTION 61%
15.0000 mL | INTRAVENOUS | Status: AC
  Administered 2016-11-22: 15 mL via ORAL

## 2016-11-22 MED ORDER — METOCLOPRAMIDE HCL 5 MG/ML IJ SOLN: 10.0000 mg | Freq: Three times a day (TID) | INTRAMUSCULAR | Status: DC

## 2016-11-22 MED ORDER — DEXAMETHASONE SODIUM PHOSPHATE 10 MG/ML IJ SOLN
10.0000 mg | Freq: Two times a day (BID) | INTRAMUSCULAR | Status: DC
  Administered 2016-11-22 – 2016-12-04 (×24): 10 mg via INTRAVENOUS
  Filled 2016-11-22 (×24): qty 1

## 2016-11-22 NOTE — Consult Note (Signed)
Pharmacy Antibiotic Note  Alejandra Barna is a 36 y.o. male admitted on 11/20/2016 with intra-abdominal infection.  Pharmacy has been consulted for zosyn dosing.  Plan: Zosyn 3.375g IV q8h (4 hour infusion).  Height: 5\' 5"  (165.1 cm) Weight: 163 lb 6.4 oz (74.1 kg) IBW/kg (Calculated) : 61.5  Temp (24hrs), Avg:99 F (37.2 C), Min:98.3 F (36.8 C), Max:99.7 F (37.6 C)   Recent Labs Lab 11/17/16 1345 11/20/16 1856 11/20/16 2056 11/21/16 0405 11/22/16 0330  WBC 10.0 9.9  --  8.8  --   CREATININE 0.68 0.76  --  0.56* 0.59*  LATICACIDVEN  --   --  1.2  --   --     Estimated Creatinine Clearance: 117.8 mL/min (A) (by C-G formula based on SCr of 0.59 mg/dL (L)).    Allergies  Allergen Reactions  . Iodine     Possible anaphylaxis per MD Per conversation with pt, pt denies allergy to IV contrast.  Pt injected 09/26/16 without premeds, pt with no reaction.    Antimicrobials this admission: ceftriaxone 10/18 >> 10/20 azithromycin 10/19 >>  Meropenem 10/19>>10/19 Vancomycin 10/18>>10/19 Zosyn 10/20>>  Dose adjustments this admission:   Microbiology results: 10/18 BCx: NG 2 days 10/19 peritoneal fluid: no organisms seen  Thank you for allowing pharmacy to be a part of this patient's care.  Ramond Dial, Pharm.D, BCPS Clinical Pharmacist  11/22/2016 1:33 PM

## 2016-11-22 NOTE — Progress Notes (Signed)
Pt alert and oriented. Pt complaints of anxiety and not being able to sleep at night. MD [facility] notified. MD ordered one time dose of 1mg  ativan and PRN 5mg  Ambien at bedtime. Pt unable to drink all of ordered contrast for CT. Per MD, ok for pt to get CT scan. Family at bedside. Will continue to monitor.

## 2016-11-22 NOTE — Consult Note (Signed)
Hematology/Oncology Consult note [facility] Telephone:([phone] Fax:([phone]) [phone]  Patient Care Team: Patient, No Pcp Per as PCP - General (General Practice) Lucilla Lame, MD as Consulting Physician (Gastroenterology) Hinda Kehr, MD as Attending Physician (Emergency Medicine)   Name of the patient: Anthony Melendez  [id number]  Nov 26, 1980    Reason for consult: nausea/ vomiting h/o metastatic gastric adenocarcinoma   Requesting physician: Dr. 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 He sees Dr. Mike Gip as an outpatient. He has progressed through FOLFOX, cyramza taxol and most recently [place]. He was seen at [facility] and was being considered for clinical trial. He has been having intractable nausea/ vomiting since last 2 weeks. He has barely had anything to eat. He constantly feels nauseous and has also had bilious vomiting at times per family. Past few days he has been having on and off diarrhea. He has been using zofran and compazine prn as an outpatient without any relief. He has also had significant weight loss over last 2 months by 14 pounds. He presented with these symptoms to [facility] and had CT abdomen on 11/17/16 which again showed gastric wall thickening, peritoneal metastases. No acute cholecystitis. No evidence of bowel obstruction. He is now admitted to [facility] for intractable nausea/ vomiting. Reports fatigue and inability to sleep. Denies any abdominal pain  ECOG PS- 2-3  Pain scale- 0   Review of systems- Review of Systems  Constitutional: Positive for malaise/fatigue and weight loss. Negative for chills and fever.  HENT: Negative for congestion, ear discharge and nosebleeds.   Eyes: Negative for blurred vision.  Respiratory: Negative for cough, hemoptysis, sputum production, shortness of  breath and wheezing.   Cardiovascular: Negative for chest pain, palpitations, orthopnea and claudication.  Gastrointestinal: Positive for diarrhea, nausea and vomiting. Negative for abdominal pain, blood in stool, constipation, heartburn and melena.  Genitourinary: Negative for dysuria, flank pain, frequency, hematuria and urgency.  Musculoskeletal: Negative for back pain, joint pain and myalgias.  Skin: Negative for rash.  Neurological: Negative for dizziness, tingling, focal weakness, seizures, weakness and headaches.  Endo/Heme/Allergies: Does not bruise/bleed easily.  Psychiatric/Behavioral: Negative for depression and suicidal ideas. The patient does not have insomnia.     Allergies  Allergen Reactions  . Iodine     Possible anaphylaxis per MD Per conversation with pt, pt denies allergy to IV contrast.  Pt injected 09/26/16 without premeds, pt with no reaction.    Patient Active Problem List   Diagnosis Date Noted  . HCAP (healthcare-associated pneumonia) 11/21/2016  . Sepsis ([place]) 11/20/2016  . Malignant ascites 09/30/2016  . Encounter for antineoplastic immunotherapy 09/09/2016  . Cancer related pain 09/02/2016  . Chemotherapy-induced neutropenia ([place]) 07/21/2016  . Goals of care, counseling/discussion 05/29/2016  . Urticaria 04/22/2016  . 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 Chronic peptic ulcer of stomach   . Encounter for antineoplastic chemotherapy 01/15/2016  . 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 Chronic gastric ulcer 10/28/2015  . Hypocalcemia 09/30/2015  . Iron deficiency anemia 08/28/2015  . Microcytic red blood cells 08/14/2015  . Hypokalemia 08/14/2015  . Malignant neoplasm of body of stomach ([place]) 08/07/2015  . Helicobacter pylori gastritis 08/02/2015  . Abnormal findings-gastrointestinal tract   . Neoplasm of digestive system   . Gastric wall thickening 08/01/2015  . Peritoneal carcinomatosis ([place]) 08/01/2015     Past Medical  History:  Diagnosis Date  . Gastric cancer ([place]) 08/2015   Folfox chemo tx's.  Marland Kitchen GERD (gastroesophageal reflux disease)      Past Surgical History:  Procedure Laterality Date  . ESOPHAGOGASTRODUODENOSCOPY (EGD) WITH PROPOFOL N/A 08/02/2015   Procedure: ESOPHAGOGASTRODUODENOSCOPY (EGD) WITH PROPOFOL;  Surgeon: Lucilla Lame, MD;  Location: [place];  Service: Endoscopy;  Laterality: N/A;  . ESOPHAGOGASTRODUODENOSCOPY (EGD) WITH PROPOFOL N/A 02/26/2016   Procedure: ESOPHAGOGASTRODUODENOSCOPY (EGD) WITH PROPOFOL;  Surgeon: Lucilla Lame, MD;  Location: [facility];  Service: Endoscopy;  Laterality: N/A;  . NO PAST SURGERIES    . PERIPHERAL VASCULAR CATHETERIZATION N/A 08/13/2015   Procedure: Glori Luis Cath Insertion;  Surgeon: Algernon Huxley, MD;  Location: [facility];  Service: Cardiovascular;  Laterality: N/A;    Social History   Social History  . Marital status: Single    Spouse name: N/A  . Number of children: N/A  . Years of education: N/A   Occupational History  . Not on file.   Social History Main Topics  . Smoking status: Never Smoker  . Smokeless tobacco: Never Used  . Alcohol use No  . Drug use: No  . Sexual activity: Not on file   Other Topics Concern  . Not on file   Social History Narrative  . No narrative on file     Family History  Problem Relation Age of Onset  . Family history unknown: Yes     Current Facility-Administered Medications:  .  0.9 % NaCl with KCl 20 mEq/ L  infusion, , Intravenous, Continuous, Gladstone Lighter, MD, Last Rate: 60 mL/hr at 11/22/16 1450 .  acetaminophen (TYLENOL) tablet 650 mg, 650 mg, Oral, Q6H PRN, 650 mg at 11/21/16 2139 **OR** acetaminophen (TYLENOL) suppository 650 mg, 650 mg, Rectal, Q6H PRN, Lance Coon, MD .  alum & mag hydroxide-simeth (MAALOX/MYLANTA) 200-200-20 MG/5ML suspension 30 mL, 30 mL, Oral, Q6H PRN, Gladstone Lighter, MD .  azithromycin (ZITHROMAX) 500 mg in dextrose 5 % 250 mL IVPB, 500  mg, Intravenous, Q24H, Gladstone Lighter, MD, Stopped at 11/22/16 2004 .  calcium carbonate (TUMS - dosed in mg elemental calcium) chewable tablet 200 mg of elemental calcium, 1 tablet, Oral, TID WC, Kalisetti, Radhika, MD, 200 mg of elemental calcium at 11/22/16 1652 .  calcium carbonate (TUMS - dosed in mg elemental calcium) chewable tablet 200 mg of elemental calcium, 1 tablet, Oral, Once, Harrie Foreman, MD .  dexamethasone (DECADRON) injection 10 mg, 10 mg, Intravenous, Q12H, Gladstone Lighter, MD, 10 mg at 11/22/16 2042 .  enoxaparin (LOVENOX) injection 40 mg, 40 mg, Subcutaneous, Q24H, Lance Coon, MD, 40 mg at 11/22/16 2043 .  feeding supplement (ENSURE ENLIVE) (ENSURE ENLIVE) liquid 237 mL, 237 mL, Oral, BID BM, Gladstone Lighter, MD, 237 mL at 11/22/16 1502 .  HYDROmorphone (DILAUDID) injection 1 mg, 1 mg, Intravenous, Q4H PRN, Lance Coon, MD .  OLANZapine [facility]) tablet 10 mg, 10 mg, Oral, QHS, Gladstone Lighter, MD, 10 mg at 11/22/16 2042 .  ondansetron (ZOFRAN) tablet 4 mg, 4 mg, Oral, Q6H PRN **OR** ondansetron (ZOFRAN) injection 4 mg, 4 mg, Intravenous, Q6H PRN, Lance Coon, MD, 4 mg at 11/22/16 1040 .  oxyCODONE (Oxy IR/ROXICODONE) immediate release tablet 5 mg, 5 mg, Oral, Q4H PRN, Lance Coon, MD, 5 mg at 11/21/16 2139 .  pantoprazole (PROTONIX) EC tablet 40 mg, 40 mg, Oral, Daily, Gladstone Lighter, MD, 40 mg at 11/22/16 1033 .  piperacillin-tazobactam (ZOSYN) IVPB 3.375 g, 3.375 g, Intravenous, Q8H, Maccia, Melissa D,  RPH, Last Rate: 12.5 mL/hr at 11/22/16 1459, 3.375 g at 11/22/16 1459 .  prochlorperazine (COMPAZINE) injection 10 mg, 10 mg, Intravenous, Q6H, Gladstone Lighter, MD, 10 mg at 11/22/16 2042 .  zolpidem (AMBIEN) tablet 5 mg, 5 mg, Oral, QHS PRN, Gladstone Lighter, MD, 5 mg at 11/22/16 2042  Facility-Administered Medications Ordered in Other Encounters:  .  heparin lock flush 100 unit/mL, 500 Units, Intravenous, Once, Corcoran, Melissa C, MD .   sodium chloride 0.9 % injection 10 mL, 10 mL, Intravenous, Once, Corcoran, Melissa C, MD .  sodium chloride flush (NS) 0.9 % injection 10 mL, 10 mL, Intravenous, PRN, Nolon Stalls C, MD, 10 mL at 08/28/15 0835   Physical exam:  Vitals:   11/22/16 0111 11/22/16 0751 11/22/16 1654 11/22/16 2101  BP:  100/64 119/84 114/73  Pulse:  92 (!) 107 100  Resp:  _0 Temp: 98.5 F (36.9 C) 98.3 F (36.8 C) 99.2 F (37.3 C) 98.9 F (37.2 C)  TempSrc: Oral Oral Oral Oral  SpO2:  96% 98% 96%  Weight:      Height:       Physical Exam  Constitutional: He is oriented to person, place, and time.  Fatigued, appears in mild distress  HENT:  Head: Normocephalic and atraumatic.  Eyes: Pupils are equal, round, and reactive to light. EOM are normal.  Neck: Normal range of motion.  Cardiovascular: Normal rate, regular rhythm and normal heart sounds.   Pulmonary/Chest: Effort normal and breath sounds normal.  Abdominal:  Firm, mildly distended, bowel sounds are sluggish  Neurological: He is alert and oriented to person, place, and time.  Skin: Skin is warm and dry.       CMP Latest Ref Rng & Units 11/22/2016  Glucose 65 - 99 mg/dL 88  BUN 6 - 20 mg/dL 6  Creatinine 0.61 - 1.24 mg/dL 0.59(L)  Sodium 135 - 145 mmol/L 140  Potassium 3.5 - 5.1 mmol/L 3.1(L)  Chloride 101 - 111 mmol/L 103  CO2 22 - 32 mmol/L 24  Calcium 8.9 - 10.3 mg/dL 8.0(L)  Total Protein 6.5 - 8.1 g/dL -  Total Bilirubin 0.3 - 1.2 mg/dL -  Alkaline Phos 38 - 126 U/L -  AST 15 - 41 U/L -  ALT 17 - 63 U/L -   CBC Latest Ref Rng & Units 11/21/2016  WBC 3.8 - 10.6 K/uL 8.8  Hemoglobin 13.0 - 18.0 g/dL 10.0(L)  Hematocrit 40.0 - 52.0 % 29.6(L)  Platelets 150 - 440 K/uL 277    _1 @  Ct Abdomen Pelvis W Contrast  Result Date: 11/22/2016 CLINICAL DATA:  Metastatic gastric cancer with abdominal pain and nausea. EXAM: CT ABDOMEN AND PELVIS WITH CONTRAST TECHNIQUE: Multidetector CT imaging of the abdomen and  pelvis was performed using the standard protocol following bolus administration of intravenous contrast. CONTRAST:  119m ISOVUE-300 IOPAMIDOL (ISOVUE-300) INJECTION 61% COMPARISON:  09/26/2016 FINDINGS: Lower chest: Development of moderate left-sided pleural effusion with adjacent compressive atelectasis. Heart size is normal without pericardial effusion. Hepatobiliary: No focal liver lesions. Gallbladder is physiologically distended. No biliary dilatation. Pancreas: Normal without pancreatic mass, ductal dilatation or inflammation. Spleen: No splenic lesions or splenomegaly. Adrenals/Urinary Tract: Adrenal glands are unremarkable. Kidneys are normal, without renal calculi, focal lesion, or hydronephrosis. Bladder is unremarkable. Stomach/Bowel: The stomach is distended with enteric contrast. Slight anterior gastric wall thickening along the body and antrum. Scattered colonic diverticulosis without acute diverticulitis. No evidence of bowel obstruction. No free air or portal venous air. Vascular/Lymphatic:  No abdominal aortic aneurysm. No gastrohepatic, portal venous, retroperitoneal, mesenteric or pelvic sidewall adenopathy. No inguinal adenopathy. Reproductive: Normal size prostate and seminal vesicles with central zone calcifications of the prostate. Other: Large volume of ascites though decreased in volume is subjectively from 09/26/2016 comparison. Mesenteric edema from the ascites is noted as well as the irregular omental thickening and caking anteriorly as before. Musculoskeletal: No aggressive appearing osseous lesions. Small fat containing umbilical hernia. IMPRESSION: 1. Development of new moderate left pleural effusion with adjacent compressive atelectasis. 2. Slight decrease in large volume of ascites within the abdomen and pelvis. 3. Omental caking and thickening again re- demonstrated with mild anterior gastric wall thickening consistent with patient's history of gastric cancer with metastasis.  Electronically Signed   By: Ashley Royalty M.D.   On: 11/22/2016 20:20   US Abdomen Limited  Result Date: 11/10/2016 CLINICAL DATA:  History of metastatic gastric carcinoma with malignant ascites and prior paracentesis procedures. EXAM: LIMITED ABDOMEN ULTRASOUND FOR ASCITES TECHNIQUE: Limited ultrasound survey for ascites was performed in all four abdominal quadrants. COMPARISON:  Recent abdominal ultrasound on 10/28/2016 and most recent paracentesis procedure on 10/21/2016. FINDINGS: Ultrasound performed today shows a small amount of ascites in the peritoneal cavity, appearing even smaller in volume compared to the 10/28/2016 ultrasound study. Paracentesis was therefore not performed. IMPRESSION: Small amount of ascites in the peritoneal cavity. Overall amount appears even smaller compared to the 9/25 study. Paracentesis was not performed today. Electronically Signed   By: Aletta Edouard M.D.   On: 11/10/2016 11:14   US Abdomen Limited  Result Date: 10/28/2016 CLINICAL DATA:  Evaluation for possible paracentesis EXAM: LIMITED ABDOMEN ULTRASOUND FOR ASCITES TECHNIQUE: Limited ultrasound survey for ascites was performed in all four abdominal quadrants. COMPARISON:  None. FINDINGS: Mild ascites is noted although no large sizable pocket to allow for safe paracentesis is noted. IMPRESSION: Mild ascites. Electronically Signed   By: Inez Catalina M.D.   On: 10/28/2016 12:32   US Paracentesis  Result Date: 11/21/2016 INDICATION: Abdominal discomfort. History of ascites. Request diagnostic and therapeutic paracentesis. EXAM: ULTRASOUND GUIDED RIGHT UPPER QUADRANT PARACENTESIS MEDICATIONS: None. COMPLICATIONS: None immediate. PROCEDURE: Informed written consent was obtained from the patient after a discussion of the risks, benefits and alternatives to treatment. A timeout was performed prior to the initiation of the procedure. Initial ultrasound scanning demonstrates a small to moderate amount of densely loculated  ascites within the right upper abdominal quadrant. The right upper abdomen was prepped and draped in the usual sterile fashion. 1% lidocaine with epinephrine was used for local anesthesia. Following this, a Safe-T-Centesis catheter was introduced. An ultrasound image was saved for documentation purposes. The paracentesis was performed. The catheter was removed and a dressing was applied. The patient tolerated the procedure well without immediate post procedural complication. FINDINGS: A total of approximately 660 mL of hazy, blood-tinged fluid was removed. Samples were sent to the laboratory as requested by the clinical team. IMPRESSION: Densely loculated pocket of ascites in the right upper quadrant. Successful ultrasound-guided paracentesis yielding 660 mL of peritoneal fluid. Read by: Ascencion Dike PA-C Electronically Signed   By: Sandi Mariscal M.D.   On: 11/21/2016 10:32   Dg Chest Port 1 View  Result Date: 11/20/2016 CLINICAL DATA:  Fever. EXAM: PORTABLE CHEST 1 VIEW COMPARISON:  CT chest dated September 04, 2016. FINDINGS: Right chest wall port catheter with tip near the cavoatrial junction. The cardiomediastinal silhouette is borderline enlarged. Low lung volumes. Dense consolidation of the left lower lobe with  adjacent small left pleural effusion. No pneumothorax. No acute osseous abnormality. IMPRESSION: Findings concerning for left lower lobe pneumonia with adjacent small pleural effusion. Electronically Signed   By: Titus Dubin M.D.   On: 11/20/2016 21:53   Dg Abd 2 Views  Result Date: 11/09/2016 CLINICAL DATA:  36 year old male with a history of gastric cancer, and ascites now with bilateral flank pain radiating into the lower abdomen EXAM: ABDOMEN - 2 VIEW COMPARISON:  Prior CT scan of the abdomen and pelvis 09/26/2016 FINDINGS: No evidence of free air. Relative paucity of bowel gas. A small amount residual contrast material opacifies the descending colon, sigmoid colon and rectum. The structures  are nondilated. Relatively increased density of the abdominal soft tissue suggests underlying ascites. No evidence of nephrolithiasis or other abnormal calcification. Osseous structures are intact and unremarkable. IMPRESSION: 1. No evidence of bowel obstruction. 2. No visible nephrolithiasis. 3. Suspect ascites. Electronically Signed   By: Jacqulynn Cadet M.D.   On: 11/09/2016 09:38    Assessment and plan- Patient is a 36 y.o. 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 He does have peritoneal carcinomatosis but no evidence of bowel obstruction seen on recent CT on 11/17/16. Recommend:  1. Upper GI series to see if there is any gastric outlet obstruction. If imaging negative- consider getting EGD  2. MRI head to rule out central cause of nausea/ vomiting although based on history cause does seem to be secondary to malignancy and peritoneal involvement  3. Compazine and zofran is not helping the patient at all as an outpatient. Recommend stopping that and try olanzapine 10 mg QHS along with prn or ATC reglan or prn haldol for nausea. Baseline Qtc is not prolonged. Also recommend a trial of decadron 10 mg BID to see if it helps with nausea  Patients prognosis is overall poor. His performance status continues to decline and with ongoing weight loss and recent symptoms, it remains to be seen if he can proceed with clinical trial at [facility]. Dr. Mike Gip to assess further goals of care on Monday  Patients nutritional status is poor and he is not getting anything much PO since 2 weeks. It would be challenging to start TPN at this time given worsening malignancy. I will discuss this with Dr. Mike Gip on Monday   Thank you for the opportunity to participate in the care of this patient   Visit Diagnosis 1. Sepsis, due to unspecified  organism ([place])   2. Fever   3. Sepsis ([place])   4. RUQ pain   5. Intractable nausea and vomiting     Dr. Randa Evens, MD, MPH [facility] at [facility] Pager- [phone] 11/22/2016  10:03 PM

## 2016-11-22 NOTE — Progress Notes (Signed)
Notified by CCMD that patients heart rate got in the 150s-160s. Pt assessed. Pt was up in bathroom vomiting. MD [facility] notified.

## 2016-11-22 NOTE — Progress Notes (Signed)
[facility] [place] NAME: Anthony Melendez    MR#:  [id number]  DATE OF BIRTH:  02-06-1980  SUBJECTIVE:  CHIEF COMPLAINT:   Chief Complaint  Patient presents with  . Abdominal Pain   -  Patient with gastric adenocarcinoma with metastatic malignant ascites, admitted with abdominal pain and nausea -used an interpreter to discuss plan with him and the family. -Very worried about significant nausea and vomiting- not improving  REVIEW OF SYSTEMS:  Review of Systems  Constitutional: Positive for malaise/fatigue and weight loss. Negative for chills and fever.  HENT: Negative for congestion, ear discharge, hearing loss and nosebleeds.   Eyes: Negative for blurred vision and double vision.  Respiratory: Negative for cough, shortness of breath and wheezing.   Cardiovascular: Negative for chest pain, palpitations and leg swelling.  Gastrointestinal: Positive for abdominal pain, nausea and vomiting. Negative for constipation and diarrhea.  Genitourinary: Negative for dysuria and urgency.  Musculoskeletal: Positive for back pain. Negative for myalgias.  Neurological: Negative for dizziness, seizures and headaches.  Psychiatric/Behavioral: Negative for depression.    DRUG ALLERGIES:   Allergies  Allergen Reactions  . Iodine     Possible anaphylaxis per MD Per conversation with pt, pt denies allergy to IV contrast.  Pt injected 09/26/16 without premeds, pt with no reaction.    VITALS:  Blood pressure 100/64, pulse 92, temperature 98.3 F (36.8 C), temperature source Oral, resp. rate 18, height 5\' 5"  (1.651 m), weight 74.1 kg (163 lb 6.4 oz), SpO2 96 %.  PHYSICAL EXAMINATION:  Physical Exam  GENERAL:  36 y.o.-year-old Well-nourished patient lying in the bed with no acute distress.  EYES: Pupils equal, round, reactive to light and accommodation. No scleral icterus. Extraocular muscles intact.  HEENT: Head atraumatic, normocephalic.  Oropharynx and nasopharynx clear.  NECK:  Supple, no jugular venous distention. No thyroid enlargement, no tenderness.  LUNGS: Normal breath sounds bilaterally, no wheezing, rales,rhonchi or crepitation. No use of accessory muscles of respiration. Decreased bibasilar breath sounds CARDIOVASCULAR: S1, S2 normal. No murmurs, rubs, or gallops.  ABDOMEN: Soft, nontender but discomfort on palpation in right upper and left upper quadrants, distended and tight. Hypoactive Bowel sounds present. No organomegaly or mass.  EXTREMITIES: No pedal edema, cyanosis, or clubbing.  NEUROLOGIC: Cranial nerves II through XII are intact. Muscle strength 5/5 in all extremities. Sensation intact. Gait not checked.  PSYCHIATRIC: The patient is alert and oriented x 3.  SKIN: No obvious rash, lesion, or ulcer.    LABORATORY PANEL:   CBC  Recent Labs Lab 11/21/16 0405  WBC 8.8  HGB 10.0*  HCT 29.6*  PLT 277   ------------------------------------------------------------------------------------------------------------------  Chemistries   Recent Labs Lab 11/20/16 1856  11/22/16 0330  NA 139  < > 140  K 4.0  < > 3.1*  CL 101  < > 103  CO2 24  < > 24  GLUCOSE 96  < > 88  BUN 11  < > 6  CREATININE 0.76  < > 0.59*  CALCIUM 9.0  < > 8.0*  AST 21  --   --   ALT 9*  --   --   ALKPHOS 150*  --   --   BILITOT 1.0  --   --   < > = values in this interval not displayed. ------------------------------------------------------------------------------------------------------------------  Cardiac Enzymes No results for input(s): TROPONINI in the last 168 hours. ------------------------------------------------------------------------------------------------------------------  RADIOLOGY:  US Paracentesis  Result Date: 11/21/2016 INDICATION: Abdominal discomfort. History  of ascites. Request diagnostic and therapeutic paracentesis. EXAM: ULTRASOUND GUIDED RIGHT UPPER QUADRANT PARACENTESIS MEDICATIONS: None.  COMPLICATIONS: None immediate. PROCEDURE: Informed written consent was obtained from the patient after a discussion of the risks, benefits and alternatives to treatment. A timeout was performed prior to the initiation of the procedure. Initial ultrasound scanning demonstrates a small to moderate amount of densely loculated ascites within the right upper abdominal quadrant. The right upper abdomen was prepped and draped in the usual sterile fashion. 1% lidocaine with epinephrine was used for local anesthesia. Following this, a Safe-T-Centesis catheter was introduced. An ultrasound image was saved for documentation purposes. The paracentesis was performed. The catheter was removed and a dressing was applied. The patient tolerated the procedure well without immediate post procedural complication. FINDINGS: A total of approximately 660 mL of hazy, blood-tinged fluid was removed. Samples were sent to the laboratory as requested by the clinical team. IMPRESSION: Densely loculated pocket of ascites in the right upper quadrant. Successful ultrasound-guided paracentesis yielding 660 mL of peritoneal fluid. Read by: Ascencion Dike PA-C Electronically Signed   By: Sandi Mariscal M.D.   On: 11/21/2016 10:32   Dg Chest Port 1 View  Result Date: 11/20/2016 CLINICAL DATA:  Fever. EXAM: PORTABLE CHEST 1 VIEW COMPARISON:  CT chest dated September 04, 2016. FINDINGS: Right chest wall port catheter with tip near the cavoatrial junction. The cardiomediastinal silhouette is borderline enlarged. Low lung volumes. Dense consolidation of the left lower lobe with adjacent small left pleural effusion. No pneumothorax. No acute osseous abnormality. IMPRESSION: Findings concerning for left lower lobe pneumonia with adjacent small pleural effusion. Electronically Signed   By: Titus Dubin M.D.   On: 11/20/2016 21:53    EKG:   Orders placed or performed during the hospital encounter of 11/17/16  . EKG 12-Lead  . EKG 12-Lead  . ED EKG  .  ED EKG  . 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  #1 community-acquired pneumonia-left lower lobe infiltrate noted on chest x-ray. -Blood cultures are negative. Currently on Rocephin and azithromycin. -Rocephin being changed to Zosyn for intra-abdominal coverage  #2 abdominal pain and nausea-has had intermittent abdominal pain associated with nausea likely secondary to his underlying malignancy. -Continue when necessary Percocet and outpatient follow-up -Prior large-volume paracentesis, however paracentesis attempted to 3 weeks ago at [facility] emergency room and no fluid could be removed. This admission only 600 cc removed -patient worried about intractable nausea vomiting . We will get a right upper quadrant ultrasound to rule out gallbladder pathology. -Oncology consult. If needed will get GI for repeat upper GI endoscopy.  #3 weight loss/failure to thrive-and able to eat due to increased abdominal pain and nausea -Started Reglan prior to meals while in the hospital. -Ensure with meals.  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 -Follows up with Dr. Mike Gip, now seeing [facility] for a clinical trial to be started next week -Will get oncology consult due to worsening symptoms  #6 DVT prophylaxis- lovenox    All the records are reviewed and case discussed with Care Management/Social Workerr. Management plans discussed with the patient, family and they are in agreement.  CODE STATUS: Full code  TOTAL TIME TAKING CARE OF THIS PATIENT: 38 minutes.   POSSIBLE D/C 2-3 days, DEPENDING ON CLINICAL CONDITION.  Orman Matsumura M.D on 11/22/2016 at 1:32 PM  Between 7am to 6pm - Pager - [phone]  After 6pm go to  [email or web address] - password [email or web address] [facility]  Office  [phone]  CC: Primary care physician; Patient, No Pcp Per

## 2016-11-23 ENCOUNTER — Inpatient Hospital Stay: Payer: 59

## 2016-11-23 DIAGNOSIS — C162 Malignant neoplasm of body of stomach: Secondary | ICD-10-CM

## 2016-11-23 LAB — C DIFFICILE QUICK SCREEN W PCR REFLEX
C DIFFICILE (CDIFF) INTERP: NOT DETECTED
C DIFFICILE (CDIFF) TOXIN: NEGATIVE
C DIFFICLE (CDIFF) ANTIGEN: NEGATIVE

## 2016-11-23 LAB — GASTROINTESTINAL PANEL BY PCR, STOOL (REPLACES STOOL CULTURE)
ASTROVIRUS: NOT DETECTED
Adenovirus F40/41: NOT DETECTED
Campylobacter species: NOT DETECTED
Cryptosporidium: NOT DETECTED
Cyclospora cayetanensis: NOT DETECTED
ENTEROAGGREGATIVE E COLI (EAEC): NOT DETECTED
ENTEROTOXIGENIC E COLI (ETEC): NOT DETECTED
Entamoeba histolytica: NOT DETECTED
Enteropathogenic E coli (EPEC): NOT DETECTED
Giardia lamblia: NOT DETECTED
NOROVIRUS GI/GII: NOT DETECTED
Plesimonas shigelloides: NOT DETECTED
ROTAVIRUS A: NOT DETECTED
SALMONELLA SPECIES: NOT DETECTED
SAPOVIRUS (I, II, IV, AND V): NOT DETECTED
SHIGA LIKE TOXIN PRODUCING E COLI (STEC): NOT DETECTED
SHIGELLA/ENTEROINVASIVE E COLI (EIEC): NOT DETECTED
VIBRIO CHOLERAE: NOT DETECTED
Vibrio species: NOT DETECTED
Yersinia enterocolitica: NOT DETECTED

## 2016-11-23 LAB — BASIC METABOLIC PANEL
ANION GAP: 12 (ref 5–15)
BUN: 7 mg/dL (ref 6–20)
CO2: 19 mmol/L — ABNORMAL LOW (ref 22–32)
Calcium: 8.8 mg/dL — ABNORMAL LOW (ref 8.9–10.3)
Chloride: 105 mmol/L (ref 101–111)
Creatinine, Ser: 0.67 mg/dL (ref 0.61–1.24)
GFR calc Af Amer: >60 mL/min (ref >60)
GLUCOSE: 170 mg/dL — AB (ref 65–99)
Potassium: 4 mmol/L (ref 3.5–5.1)
SODIUM: 136 mmol/L (ref 135–145)

## 2016-11-23 MED ORDER — NYSTATIN 100000 UNIT/ML MT SUSP
5.0000 mL | Freq: Four times a day (QID) | OROMUCOSAL | Status: DC
  Administered 2016-11-23 – 2016-12-03 (×22): 500000 [IU] via ORAL
  Filled 2016-11-23 (×23): qty 5

## 2016-11-23 MED ORDER — LORAZEPAM 2 MG/ML IJ SOLN
1.0000 mg | Freq: Four times a day (QID) | INTRAMUSCULAR | Status: DC | PRN
  Administered 2016-11-23 – 2016-11-30 (×9): 1 mg via INTRAVENOUS
  Filled 2016-11-23 (×11): qty 1

## 2016-11-23 NOTE — Progress Notes (Signed)
[facility] at [place] NAME: Anthony Melendez    MR#:  [id number]  DATE OF BIRTH:  [birth year]  SUBJECTIVE:  CHIEF COMPLAINT:   Chief Complaint  Patient presents with  . Abdominal Pain   - Used in interpreter to communicate. - Still nauseous this morning, vomiting is improved since last night. CT with no obstruction noted -GI consult is pending this morning  REVIEW OF SYSTEMS:  Review of Systems  Constitutional: Positive for malaise/fatigue and weight loss. Negative for chills and fever.  HENT: Negative for congestion, ear discharge, hearing loss and nosebleeds.   Eyes: Negative for blurred vision and double vision.  Respiratory: Negative for cough, shortness of breath and wheezing.   Cardiovascular: Negative for chest pain, palpitations and leg swelling.  Gastrointestinal: Positive for abdominal pain, nausea and vomiting. Negative for constipation and diarrhea.  Genitourinary: Negative for dysuria and urgency.  Musculoskeletal: Positive for back pain. Negative for myalgias.  Neurological: Negative for dizziness, seizures and headaches.  Psychiatric/Behavioral: Negative for depression.    DRUG ALLERGIES:   Allergies  Allergen Reactions  . Iodine     Possible anaphylaxis per MD Per conversation with pt, pt denies allergy to IV contrast.  Pt injected 09/26/16 without premeds, pt with no reaction.    VITALS:  Blood pressure 114/73, pulse 100, temperature 98.9 F (37.2 C), temperature source Oral, resp. rate 16, height 5\' 5"  (1.651 m), weight 74.1 kg (163 lb 6.4 oz), SpO2 96 %.  PHYSICAL EXAMINATION:  Physical Exam  GENERAL:  36 y.o.-year-old ill-nourished patient lying in the bed with no acute distress.  EYES: Pupils equal, round, reactive to light and accommodation. No scleral icterus. Extraocular muscles intact.  HEENT: Head atraumatic, normocephalic. Oropharynx and nasopharynx clear.  NECK:  Supple, no jugular venous  distention. No thyroid enlargement, no tenderness.  LUNGS: Normal breath sounds bilaterally, no wheezing, rales,rhonchi or crepitation. No use of accessory muscles of respiration. Decreased bibasilar breath sounds CARDIOVASCULAR: S1, S2 normal. No murmurs, rubs, or gallops.  ABDOMEN: Soft, nontender but discomfort on palpation in left upper quadrant, distended and tight. Hypoactive Bowel sounds present. No organomegaly or mass.  EXTREMITIES: No pedal edema, cyanosis, or clubbing.  NEUROLOGIC: Cranial nerves II through XII are intact. Muscle strength 5/5 in all extremities. Sensation intact. Gait not checked.  PSYCHIATRIC: The patient is alert and oriented x 3.  SKIN: No obvious rash, lesion, or ulcer.    LABORATORY PANEL:   CBC  Recent Labs Lab 11/21/16 0405  WBC 8.8  HGB 10.0*  HCT 29.6*  PLT 277   ------------------------------------------------------------------------------------------------------------------  Chemistries   Recent Labs Lab 11/20/16 1856  11/22/16 1412 11/23/16 0404  NA 139  < >  --  136  K 4.0  < >  --  4.0  CL 101  < >  --  105  CO2 24  < >  --  19*  GLUCOSE 96  < >  --  170*  BUN 11  < >  --  7  CREATININE 0.76  < >  --  0.67  CALCIUM 9.0  < >  --  8.8*  MG  --   --  1.5*  --   AST 21  --   --   --   ALT 9*  --   --   --   ALKPHOS 150*  --   --   --   BILITOT 1.0  --   --   --   < > =  values in this interval not displayed. ------------------------------------------------------------------------------------------------------------------  Cardiac Enzymes No results for input(s): TROPONINI in the last 168 hours. ------------------------------------------------------------------------------------------------------------------  RADIOLOGY:  Ct Abdomen Pelvis W Contrast  Result Date: 11/22/2016 CLINICAL DATA:  Metastatic gastric cancer with abdominal pain and nausea. EXAM: CT ABDOMEN AND PELVIS WITH CONTRAST TECHNIQUE: Multidetector CT imaging of  the abdomen and pelvis was performed using the standard protocol following bolus administration of intravenous contrast. CONTRAST:  146mL ISOVUE-300 IOPAMIDOL (ISOVUE-300) INJECTION 61% COMPARISON:  09/26/2016 FINDINGS: Lower chest: Development of moderate left-sided pleural effusion with adjacent compressive atelectasis. Heart size is normal without pericardial effusion. Hepatobiliary: No focal liver lesions. Gallbladder is physiologically distended. No biliary dilatation. Pancreas: Normal without pancreatic mass, ductal dilatation or inflammation. Spleen: No splenic lesions or splenomegaly. Adrenals/Urinary Tract: Adrenal glands are unremarkable. Kidneys are normal, without renal calculi, focal lesion, or hydronephrosis. Bladder is unremarkable. Stomach/Bowel: The stomach is distended with enteric contrast. Slight anterior gastric wall thickening along the body and antrum. Scattered colonic diverticulosis without acute diverticulitis. No evidence of bowel obstruction. No free air or portal venous air. Vascular/Lymphatic: No abdominal aortic aneurysm. No gastrohepatic, portal venous, retroperitoneal, mesenteric or pelvic sidewall adenopathy. No inguinal adenopathy. Reproductive: Normal size prostate and seminal vesicles with central zone calcifications of the prostate. Other: Large volume of ascites though decreased in volume is subjectively from 09/26/2016 comparison. Mesenteric edema from the ascites is noted as well as the irregular omental thickening and caking anteriorly as before. Musculoskeletal: No aggressive appearing osseous lesions. Small fat containing umbilical hernia. IMPRESSION: 1. Development of new moderate left pleural effusion with adjacent compressive atelectasis. 2. Slight decrease in large volume of ascites within the abdomen and pelvis. 3. Omental caking and thickening again re- demonstrated with mild anterior gastric wall thickening consistent with patient's history of gastric cancer with  metastasis. Electronically Signed   By: Ashley Royalty M.D.   On: 11/22/2016 20:20   US Paracentesis  Result Date: 11/21/2016 INDICATION: Abdominal discomfort. History of ascites. Request diagnostic and therapeutic paracentesis. EXAM: ULTRASOUND GUIDED RIGHT UPPER QUADRANT PARACENTESIS MEDICATIONS: None. COMPLICATIONS: None immediate. PROCEDURE: Informed written consent was obtained from the patient after a discussion of the risks, benefits and alternatives to treatment. A timeout was performed prior to the initiation of the procedure. Initial ultrasound scanning demonstrates a small to moderate amount of densely loculated ascites within the right upper abdominal quadrant. The right upper abdomen was prepped and draped in the usual sterile fashion. 1% lidocaine with epinephrine was used for local anesthesia. Following this, a Safe-T-Centesis catheter was introduced. An ultrasound image was saved for documentation purposes. The paracentesis was performed. The catheter was removed and a dressing was applied. The patient tolerated the procedure well without immediate post procedural complication. FINDINGS: A total of approximately 660 mL of hazy, blood-tinged fluid was removed. Samples were sent to the laboratory as requested by the clinical team. IMPRESSION: Densely loculated pocket of ascites in the right upper quadrant. Successful ultrasound-guided paracentesis yielding 660 mL of peritoneal fluid. Read by: Ascencion Dike PA-C Electronically Signed   By: Sandi Mariscal M.D.   On: 11/21/2016 10:32    EKG:   Orders placed or performed during the hospital encounter of 11/17/16  . EKG 12-Lead  . EKG 12-Lead  . ED EKG  . ED EKG  . 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  #  1 community-acquired pneumonia-left lower lobe infiltrate noted  on chest x-ray. -Blood cultures are negative. Currently on Rocephin and azithromycin. -Rocephin being changed to Zosyn for intra-abdominal coverage -Stop azithromycin after 5 days  #2 abdominal pain and nausea-has had intermittent abdominal pain associated with nausea likely secondary to his underlying malignancy. -Continue when necessary Percocet and outpatient follow-up -Prior large-volume paracentesis, however paracentesis attempted to 3 weeks ago at [facility] emergency room and no fluid could be removed. This admission only 600 cc removed -We will get a right upper quadrant ultrasound to rule out gallbladder pathology. -Oncology consult. I -Per oncology recommendations, added Zyprexa at bedtime and Decadron twice a day. -Hold off on MRI of the brain at this time as it does not appear to be a central cause of nausea/vomiting -CT of the abdomen done, patient unable to drink the whole oral contrast. Did not show any obstruction, distended stomach noted though. -GI consult requested to see if patient needs a repeat upper GI endoscopy..  #3 weight loss/failure to thrive-and able to eat due to increased abdominal pain and nausea -Nausea medications being adjusted. Patient will be started on a clear liquid diet today. -Ensure with meals. -Nutritional status is poor. 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 -Follows up with Dr. Mike Gip, now seeing [facility] for a clinical trial to be started next week -Appreciate oncology input  #6 DVT prophylaxis- lovenox  Father at bedside updated as well.   All the records are reviewed and case discussed with Care Management/Social Workerr. Management plans discussed with the patient, family and they are in agreement.  CODE STATUS: Full code  TOTAL TIME TAKING CARE  OF THIS PATIENT: 37 minutes.   POSSIBLE D/C 2-3 days, DEPENDING ON CLINICAL CONDITION.   Junior Kenedy M.D on 11/23/2016 at 7:51 AM  Between 7am to 6pm - Pager - [phone]  After 6pm go to [email or web address] - password [email or web address] [facility]  Office  [phone]  CC: Primary care physician; Patient, No Pcp Per

## 2016-11-23 NOTE — Consult Note (Addendum)
Cephas Darby, MD [address]  [place]  [place], [place] [address]  Main: [phone]  Fax: [phone] Pager: [phone]   Consultation  Referring Provider:     Dr Gladstone Lighter Primary Care Physician:  Patient, No Pcp Per Primary Gastroenterologist: None        Reason for Consultation:   Intractable nausea and vomiting  Date of Admission:  11/20/2016 Date of Consultation:  11/23/2016         HPI:   Anthony Melendez is a 36 y.o. 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 He is on zofran and compazine as out pt which are not helping. He reports that he does not feel nauseous for 3 hrs after taking anti-emetic, then nausea returns a/w bilious emesis. But, when he eats something, it comes right back up. He is not tolerating even liquid diet. He also reports about 3 days of non-bloody diarrhea, had about 4-6 BMs yesterday. He reports burning while urination. His parents and sisters were bedside when I saw him. His sister helped with translation and later by [employer] interpreter. He has appt at [facility] on Tuesday next week to receive clinical trial medication. His sister informed the coordinator at [facility] that he is currently hospitalized at [facility]. He had 687m of ascitic fluid removed. His last EGD was in 02/2016 which decrease in size of gastric ulcer with no bleeding. He had CT A/P on admission which showed distended stomach, but no evidence of gastric outlet obstruction.   GI Procedures: EGD 07/2015 DIAGNOSIS:  A. STOMACH; BIOPSY:  - POORLY DIFFERENTIATED ADENOCARCINOMA.  - MODERATE CHRONIC ACTIVE HELICOBACTER ASSOCIATED GASTRITIS.  - PANCYTOKERATIN STAIN CONFIRMS.   EGD 02/2016 DIAGNOSIS:  A. STOMACH, BODY; COLD BIOPSY:  - MODERATE CHRONIC GASTRITIS AND EDEMA.  - NEGATIVE FOR DYSPLASIA AND MALIGNANCY.  - NEGATIVE FOR  HELICOBACTER PYLORI.  Past Medical History:  Diagnosis Date  . [place]   Folfox chemo tx's.  .Marland KitchenGERD (gastroesophageal reflux disease)     Past Surgical History:  Procedure Laterality Date  . ESOPHAGOGASTRODUODENOSCOPY (EGD) WITH PROPOFOL N/A 08/02/2015   Procedure: ESOPHAGOGASTRODUODENOSCOPY (EGD) WITH PROPOFOL;  Surgeon: DLucilla Lame MD;  [place] [facility]  Service: Endoscopy;  Laterality: N/A;  . ESOPHAGOGASTRODUODENOSCOPY (EGD) WITH PROPOFOL N/A 02/26/2016   Procedure: ESOPHAGOGASTRODUODENOSCOPY (EGD) WITH PROPOFOL;  Surgeon: DLucilla Lame MD;  Location: [facility] ENDOSCOPY;  Service: Endoscopy;  Laterality: N/A;  . NO PAST SURGERIES    . PERIPHERAL VASCULAR CATHETERIZATION N/A 08/13/2015   Procedure: PGlori LuisCath Insertion;  Surgeon: JAlgernon Huxley MD;  [facility];  Service: Cardiovascular;  Laterality: N/A;    Prior to Admission medications   Medication Sig Start Date End Date Taking? Authorizing Provider  HYDROmorphone (DILAUDID) 2 MG tablet Take 1 tablet (2 mg total) by mouth every 12 (twelve) hours as needed for severe pain. 11/17/16 11/17/17 Yes WEarleen Newport MD  promethazine (PHENERGAN) 25 MG tablet Take 1 tablet (25 mg total) by mouth every 4 (four) hours as needed for nausea or vomiting. 11/17/16  Yes WEarleen Newport MD    Family History  Problem Relation Age of Onset  . Family history unknown: Yes     Social History  Substance Use Topics  . Smoking status: Never Smoker  . Smokeless tobacco: Never Used  . Alcohol use No    Allergies as  of 11/20/2016 - Review Complete 11/20/2016  Allergen Reaction Noted  . Iodine  04/24/2016    Review of Systems:    All systems reviewed and negative except where noted in HPI.   Physical Exam:  Vital signs in last 24 hours: Temp:  [98.5 F (36.9 C)-98.9 F (37.2 C)] 98.5 F (36.9 C) (10/21 1453) Pulse Rate:  [100-111] 111 (10/21 1453) Resp:  [16-18] 18 (10/21  1453) BP: (111-121)/(73-88) 111/82 (10/21 1453) SpO2:  [95 %-97 %] 97 % (10/21 1453) Last BM Date: 11/22/16 General: Ill and pale appearing, cooperative in NAD Head:  Normocephalic and atraumatic, alopecia. Eyes:   No icterus.   Conjunctiva pale. Ears:  Normal auditory acuity. Neck:  Supple; no masses or thyroidomegaly Lungs: Respirations even and unlabored. Lungs, dull to auscultation in left lung base. No wheezes, crackles, or rhonchi.  Heart:  Regular rate and rhythm;  Without murmur, clicks, rubs or gallops Abdomen: firm, distended, nontender. hypoactive bowel sounds. No appreciable masses or hepatomegaly.  No rebound or guarding.  Rectal:  Not performed. Msk:  Symmetrical without gross deformities.  Extremities:  Without edema, cyanosis or clubbing. Neurologic:  Alert and oriented x3;  grossly normal neurologically, sluggish appearing. Skin:  Intact without significant lesions or rashes. Psych:  Alert and cooperative. Normal affect.  LAB RESULTS: CBC Latest Ref Rng & Units 11/21/2016 11/20/2016 11/17/2016  WBC 3.8 - 10.6 K/uL 8.8 9.9 10.0  Hemoglobin 13.0 - 18.0 g/dL 10.0(L) 11.7(L) 10.9(L)  Hematocrit 40.0 - 52.0 % 29.6(L) 36.5(L) 33.7(L)  Platelets 150 - 440 K/uL 277 340 359    BMET BMP Latest Ref Rng & Units 11/23/2016 11/22/2016 11/21/2016  Glucose 65 - 99 mg/dL 170(H) 88 103(H)  BUN 6 - 20 mg/dL _0 Creatinine 0.61 - 1.24 mg/dL 0.67 0.59(L) 0.56(L)  Sodium 135 - 145 mmol/L 136 140 139  Potassium 3.5 - 5.1 mmol/L 4.0 3.1(L) 3.2(L)  Chloride 101 - 111 mmol/L 105 103 103  CO2 22 - 32 mmol/L 19(L) 24 24  Calcium 8.9 - 10.3 mg/dL 8.8(L) 8.0(L) 7.9(L)    LFT Hepatic Function Latest Ref Rng & Units 11/20/2016 11/17/2016 10/21/2016  Total Protein 6.5 - 8.1 g/dL 8.1 7.9 6.4(L)  Albumin 3.5 - 5.0 g/dL 3.2(L) 3.2(L) 2.5(L)  AST 15 - 41 U/L _1 ALT 17 - 63 U/L 9(L) 9(L) 14(L)  Alk Phosphatase 38 - 126 U/L 150(H) 148(H) 210(H)  Total Bilirubin 0.3 - 1.2 mg/dL 1.0  1.0 0.7     STUDIES: Ct Abdomen Pelvis W Contrast  Result Date: 11/22/2016 CLINICAL DATA:  Metastatic gastric cancer with abdominal pain and nausea. EXAM: CT ABDOMEN AND PELVIS WITH CONTRAST TECHNIQUE: Multidetector CT imaging of the abdomen and pelvis was performed using the standard protocol following bolus administration of intravenous contrast. CONTRAST:  163m ISOVUE-300 IOPAMIDOL (ISOVUE-300) INJECTION 61% COMPARISON:  09/26/2016 FINDINGS: Lower chest: Development of moderate left-sided pleural effusion with adjacent compressive atelectasis. Heart size is normal without pericardial effusion. Hepatobiliary: No focal liver lesions. Gallbladder is physiologically distended. No biliary dilatation. Pancreas: Normal without pancreatic mass, ductal dilatation or inflammation. Spleen: No splenic lesions or splenomegaly. Adrenals/Urinary Tract: Adrenal glands are unremarkable. Kidneys are normal, without renal calculi, focal lesion, or hydronephrosis. Bladder is unremarkable. Stomach/Bowel: The stomach is distended with enteric contrast. Slight anterior gastric wall thickening along the body and antrum. Scattered colonic diverticulosis without acute diverticulitis. No evidence of bowel obstruction. No free air or portal venous air. Vascular/Lymphatic: No abdominal aortic aneurysm. No gastrohepatic,  portal venous, retroperitoneal, mesenteric or pelvic sidewall adenopathy. No inguinal adenopathy. Reproductive: Normal size prostate and seminal vesicles with central zone calcifications of the prostate. Other: Large volume of ascites though decreased in volume is subjectively from 09/26/2016 comparison. Mesenteric edema from the ascites is noted as well as the irregular omental thickening and caking anteriorly as before. Musculoskeletal: No aggressive appearing osseous lesions. Small fat containing umbilical hernia. IMPRESSION: 1. Development of new moderate left pleural effusion with adjacent compressive  atelectasis. 2. Slight decrease in large volume of ascites within the abdomen and pelvis. 3. Omental caking and thickening again re- demonstrated with mild anterior gastric wall thickening consistent with patient's history of gastric cancer with metastasis. Electronically Signed   By: Ashley Royalty M.D.   On: 11/22/2016 20:20   US Abdomen Limited Ruq  Result Date: 11/23/2016 CLINICAL DATA:  RIGHT upper quadrant pain. EXAM: ULTRASOUND ABDOMEN LIMITED RIGHT UPPER QUADRANT COMPARISON:  CT abdomen and pelvis 11/22/2016. Ultrasound-guided paracentesis 11/21/2016. FINDINGS: Gallbladder: No gallstones or wall thickening visualized. No sonographic Murphy sign noted by sonographer. Common bile duct: Diameter: 3.4 mm, normal Liver: No focal lesion identified. Within normal limits in parenchymal echogenicity. Portal vein is patent on color Doppler imaging with normal direction of blood flow towards the liver. Additional findings: Ascites. Compared with 11/21/16 sonography, ascitic fluid in the RIGHT upper quadrant has recurred. IMPRESSION: No gallstones or biliary ductal dilatation. Malignant ascites has recurred in the RIGHT upper quadrant. Electronically Signed   By: Staci Righter M.D.   On: 11/23/2016 09:42      Impression / Plan:   Anthony Melendez is a 36 y.o. male with metastatic adeno Ca of stomach, progressed through 3 lines of chemo, peritoneal carcinomatosis, p/w 10 days of intractable nausea and non-bloody emesis. Etiology is multifactorial secondary to metastatic cancer itself, gastroparesis from peritoneal carcinomatosis and opioid analgesics. There is no evidence of gastric outlet obstruction based on imaging. Fluid cx and blood cx are negative. He is already on empiric antibiotics.   -Check stool studies to r/o infection -On trial of decadron for nausea -On ativan and olazapine for nausea -Clear liquid diet only -Can try prokinetic agents such as erythromycin or reglan for underlying  gastroparesis -Discussed with pt about EGD which may help in decision making about overall prognosis and he agreed -NPO past midnight I have discussed alternative options, risks & benefits,  which include, but are not limited to, bleeding, infection, perforation,respiratory complication & drug reaction.  The patient agrees with this plan & written consent will be obtained.     Thank you for involving me in the care of this patient.      LOS: 3 days   Sherri Sear, MD  11/23/2016, 5:14 PM   Note: This dictation was prepared with Dragon dictation along with smaller phrase technology. Any transcriptional errors that result from this process are unintentional.

## 2016-11-24 ENCOUNTER — Encounter
Admission: EM | Disposition: A | Discharge status: Other Acute Inpatient Hospital | Payer: Self-pay | Source: Home / Self Care | Attending: Internal Medicine

## 2016-11-24 ENCOUNTER — Inpatient Hospital Stay: Payer: 59 | Admitting: Anesthesiology

## 2016-11-24 DIAGNOSIS — R630 Anorexia: Secondary | ICD-10-CM

## 2016-11-24 DIAGNOSIS — R634 Abnormal weight loss: Secondary | ICD-10-CM

## 2016-11-24 DIAGNOSIS — K253 Acute gastric ulcer without hemorrhage or perforation: Secondary | ICD-10-CM

## 2016-11-24 DIAGNOSIS — R1114 Bilious vomiting: Secondary | ICD-10-CM

## 2016-11-24 HISTORY — PX: ESOPHAGOGASTRODUODENOSCOPY: SHX5428

## 2016-11-24 LAB — COMPREHENSIVE METABOLIC PANEL
ALK PHOS: 90 U/L (ref 38–126)
ALT: 12 U/L — ABNORMAL LOW (ref 17–63)
AST: 22 U/L (ref 15–41)
Albumin: 2.7 g/dL — ABNORMAL LOW (ref 3.5–5.0)
Anion gap: 5 (ref 5–15)
BILIRUBIN TOTAL: 0.7 mg/dL (ref 0.3–1.2)
BUN: 8 mg/dL (ref 6–20)
CALCIUM: 8.6 mg/dL — AB (ref 8.9–10.3)
CO2: 24 mmol/L (ref 22–32)
Chloride: 109 mmol/L (ref 101–111)
Creatinine, Ser: 0.63 mg/dL (ref 0.61–1.24)
GFR calc Af Amer: >60 mL/min (ref >60)
Glucose, Bld: 207 mg/dL — ABNORMAL HIGH (ref 65–99)
POTASSIUM: 3.5 mmol/L (ref 3.5–5.1)
Sodium: 138 mmol/L (ref 135–145)
TOTAL PROTEIN: 6.7 g/dL (ref 6.5–8.1)

## 2016-11-24 LAB — CBC
HEMATOCRIT: 29 % — AB (ref 40.0–52.0)
HEMOGLOBIN: 9.3 g/dL — AB (ref 13.0–18.0)
MCH: 24.4 pg — ABNORMAL LOW (ref 26.0–34.0)
MCHC: 32.3 g/dL (ref 32.0–36.0)
MCV: 75.5 fL — AB (ref 80.0–100.0)
Platelets: 282 10*3/uL (ref 150–440)
RBC: 3.84 MIL/uL — ABNORMAL LOW (ref 4.40–5.90)
RDW: 15.6 % — AB (ref 11.5–14.5)
WBC: 15.2 10*3/uL — ABNORMAL HIGH (ref 3.8–10.6)

## 2016-11-24 SURGERY — EGD (ESOPHAGOGASTRODUODENOSCOPY)
Anesthesia: General

## 2016-11-24 MED ORDER — PROPOFOL 500 MG/50ML IV EMUL
INTRAVENOUS | Status: AC
  Filled 2016-11-24: qty 50

## 2016-11-24 MED ORDER — LIDOCAINE HCL (CARDIAC) 20 MG/ML IV SOLN
INTRAVENOUS | Status: DC | PRN
  Administered 2016-11-24: 100 mg via INTRAVENOUS

## 2016-11-24 MED ORDER — PROPOFOL 10 MG/ML IV BOLUS
INTRAVENOUS | Status: AC
  Filled 2016-11-24: qty 20

## 2016-11-24 MED ORDER — PROPOFOL 500 MG/50ML IV EMUL
INTRAVENOUS | Status: DC | PRN
  Administered 2016-11-24: 150 ug/kg/min via INTRAVENOUS

## 2016-11-24 MED ORDER — GLYCOPYRROLATE 0.2 MG/ML IJ SOLN
INTRAMUSCULAR | Status: AC
  Filled 2016-11-24: qty 2

## 2016-11-24 MED ORDER — GLYCOPYRROLATE 0.2 MG/ML IJ SOLN
INTRAMUSCULAR | Status: DC | PRN
  Administered 2016-11-24: 0.2 mg via INTRAVENOUS

## 2016-11-24 MED ORDER — METOCLOPRAMIDE HCL 5 MG/ML IJ SOLN
10.0000 mg | Freq: Three times a day (TID) | INTRAMUSCULAR | Status: DC
  Administered 2016-11-24 – 2016-11-28 (×15): 10 mg via INTRAVENOUS
  Filled 2016-11-24 (×16): qty 2

## 2016-11-24 MED ORDER — SODIUM CHLORIDE 0.9 % IV SOLN
INTRAVENOUS | Status: DC
  Administered 2016-11-24: 1000 mL via INTRAVENOUS

## 2016-11-24 MED ORDER — LIDOCAINE HCL (PF) 2 % IJ SOLN
INTRAMUSCULAR | Status: AC
  Filled 2016-11-24: qty 10

## 2016-11-24 MED ORDER — PROPOFOL 10 MG/ML IV BOLUS
INTRAVENOUS | Status: DC | PRN
  Administered 2016-11-24: 50 mg via INTRAVENOUS
  Administered 2016-11-24: 40 mg via INTRAVENOUS
  Administered 2016-11-24: 30 mg via INTRAVENOUS

## 2016-11-24 MED ORDER — PHENYLEPHRINE HCL 10 MG/ML IJ SOLN
INTRAMUSCULAR | Status: DC | PRN
  Administered 2016-11-24 (×2): 100 ug via INTRAVENOUS

## 2016-11-24 NOTE — Op Note (Addendum)
[facility] Gastroenterology Patient Name: Anthony Melendez Procedure Date: 11/24/2016 12:19 PM MRN: [id number] Account #: [id number] Date of Birth: 04/20/1980 Admit Type: Inpatient Age: 36 Room: [facility] ENDO ROOM 4 Gender: Male Note Status: Finalized Procedure:            Upper GI endoscopy Indications:          Nausea, Persistent vomiting, Personal history of                        malignant gastric neoplasm Providers:            Lin Landsman MD, MD Referring MD:         No Local Md, MD (Referring MD) Medicines:            Monitored Anesthesia Care Complications:        No immediate complications. Estimated blood loss: None. Procedure:            Pre-Anesthesia Assessment:                       - Prior to the procedure, a History and Physical was                        performed, and patient medications and allergies were                        reviewed. The patient is competent. The risks and                        benefits of the procedure and the sedation options and                        risks were discussed with the patient. All questions                        were answered and informed consent was obtained.                        Patient identification and proposed procedure were                        verified by the physician, the nurse, the                        anesthesiologist, the anesthetist and the technician in                        the pre-procedure area in the procedure room. Mental                        Status Examination: lethargic. Airway Examination:                        normal oropharyngeal airway and neck mobility.                        Respiratory Examination: clear to auscultation. CV                        Examination: normal. Prophylactic Antibiotics: The  patient does not require prophylactic antibiotics.                        Prior Anticoagulants: The patient has taken no previous       anticoagulant or antiplatelet agents. ASA Grade                        Assessment: IV - A patient with severe systemic disease                        that is a constant threat to life. After reviewing the                        risks and benefits, the patient was deemed in                        satisfactory condition to undergo the procedure. The                        anesthesia plan was to use monitored anesthesia care                        (MAC). Immediately prior to administration of                        medications, the patient was re-assessed for adequacy                        to receive sedatives. The heart rate, respiratory rate,                        oxygen saturations, blood pressure, adequacy of                        pulmonary ventilation, and response to care were                        monitored throughout the procedure. The physical status                        of the patient was re-assessed after the procedure.                       After obtaining informed consent, the endoscope was                        passed under direct vision. Throughout the procedure,                        the patient's blood pressure, pulse, and oxygen                        saturations were monitored continuously. The Endoscope                        was introduced through the mouth, and advanced to the                        second part of duodenum. The upper GI endoscopy was  accomplished without difficulty. The patient tolerated                        the procedure well. Findings:      The duodenal bulb and second portion of the duodenum were normal,       moderate amounts of bilious fluid seen refluxing back into D2.      One non-obstructing oozing cratered gastric ulcer of significant       severity was found all along the greater curvature of the stomach. There       is no evidence of perforation.This finding is consistent with pt's known       h/o gastric  carcinoma, size is increased compared to EGD from 02/2016      Large amounts of Bilious fluid was found in the gastric fundus and in       the gastric body which was aspirated      Large amounts of bile filling the middle third of the esophagus and in       the lower third of the esophagus, which was aspirated, the underlying       esophageal mucosa is friable and erythematous due to stasis.      There was no peristalsis of the stomach or esophagus      There is no evidence of gastric outlet obstruction Impression:           - Normal duodenal bulb and second portion of the                        duodenum.                       - Non-obstructing oozing gastric ulcer. There is no                        evidence of perforation.                       - Bilious gastric fluid.                       - Bile in the middle third of the esophagus and in the                        lower third of the esophagus.                       - These findings are highly concerning for significant                        dysmotility of the GI tract explaining pt's symptoms of                        intractable hiccups, nausea and vomiting                       - There is no evidence of gastric outlet obstruction                       - No specimens collected. Recommendation:       - Return patient to hospital ward for ongoing care.                       -  Can start soft diet as tolerated                       - Continue present medications.                       - Prilosec 54m BID                       - Overall prognosis is gaurded                       - Defer to oncology about further goals of care Procedure Code(s):    --- Professional ---                       [id number] Esophagogastroduodenoscopy, flexible, transoral;                        diagnostic, including collection of specimen(s) by                        brushing or washing, when performed (separate procedure) Diagnosis Code(s):    --- Professional  ---                       K25.4, Chronic or unspecified gastric ulcer with                        hemorrhage                       R11.0, Nausea                       R11.10, Vomiting, unspecified                       Z85.028, Personal history of other malignant neoplasm                        of stomach CPT copyright 2016 [employer]. All rights reserved. The codes documented in this report are preliminary and upon coder review may  be revised to meet current compliance requirements. Dr. RUlyess MortRLin LandsmanMD, MD 11/24/2016 12:47:42 PM This report has been signed electronically. Number of Addenda: 0 Note Initiated On: 11/24/2016 12:19 PM      [facility]

## 2016-11-24 NOTE — Progress Notes (Signed)
[facility] NAME: Anthony Melendez    MR#:  [id number]  DATE OF BIRTH:  [birth year]  SUBJECTIVE:  CHIEF COMPLAINT:   Chief Complaint  Patient presents with  . Abdominal Pain   - still has nausea/ vomiting - for EGD today  REVIEW OF SYSTEMS:  Review of Systems  Constitutional: Positive for malaise/fatigue and weight loss. Negative for chills and fever.  HENT: Negative for congestion, ear discharge, hearing loss and nosebleeds.   Eyes: Negative for blurred vision and double vision.  Respiratory: Negative for cough, shortness of breath and wheezing.   Cardiovascular: Negative for chest pain, palpitations and leg swelling.  Gastrointestinal: Positive for abdominal pain, nausea and vomiting. Negative for constipation and diarrhea.  Genitourinary: Negative for dysuria and urgency.  Musculoskeletal: Positive for back pain. Negative for myalgias.  Neurological: Negative for dizziness, seizures and headaches.  Psychiatric/Behavioral: Negative for depression.    DRUG ALLERGIES:   Allergies  Allergen Reactions  . Iodine     Possible anaphylaxis per MD Per conversation with pt, pt denies allergy to IV contrast.  Pt injected 09/26/16 without premeds, pt with no reaction.    VITALS:  Blood pressure 96/76, pulse 95, temperature (!) 96.8 F (36 C), temperature source Tympanic, resp. rate (!) 22, height 5\' 5"  (1.651 m), weight 74.1 kg (163 lb 6.4 oz), SpO2 98 %.  PHYSICAL EXAMINATION:  Physical Exam  GENERAL:  36 y.o.-year-old ill-nourished patient lying in the bed with no acute distress.  EYES: Pupils equal, round, reactive to light and accommodation. No scleral icterus. Extraocular muscles intact.  HEENT: Head atraumatic, normocephalic. Oropharynx and nasopharynx clear.  NECK:  Supple, no jugular venous distention. No thyroid enlargement, no tenderness.  LUNGS: Normal breath sounds bilaterally, no wheezing, rales,rhonchi or  crepitation. No use of accessory muscles of respiration. Decreased bibasilar breath sounds CARDIOVASCULAR: S1, S2 normal. No murmurs, rubs, or gallops.  ABDOMEN: Soft, nontender but discomfort on palpation in left upper quadrant, distended and tight. Hypoactive Bowel sounds present. No organomegaly or mass.  EXTREMITIES: No pedal edema, cyanosis, or clubbing.  NEUROLOGIC: Cranial nerves II through XII are intact. Muscle strength 5/5 in all extremities. Sensation intact. Gait not checked.  PSYCHIATRIC: The patient is alert and oriented x 3.  SKIN: No obvious rash, lesion, or ulcer.    LABORATORY PANEL:   CBC  Recent Labs Lab 11/24/16 0351  WBC 15.2*  HGB 9.3*  HCT 29.0*  PLT 282   ------------------------------------------------------------------------------------------------------------------  Chemistries   Recent Labs Lab 11/22/16 1412  11/24/16 0351  NA  --   < > 138  K  --   < > 3.5  CL  --   < > 109  CO2  --   < > 24  GLUCOSE  --   < > 207*  BUN  --   < > 8  CREATININE  --   < > 0.63  CALCIUM  --   < > 8.6*  MG 1.5*  --   --   AST  --   --  22  ALT  --   --  12*  ALKPHOS  --   --  90  BILITOT  --   --  0.7  < > = values in this interval not displayed. ------------------------------------------------------------------------------------------------------------------  Cardiac Enzymes No results for input(s): TROPONINI in the last 168 hours. ------------------------------------------------------------------------------------------------------------------  RADIOLOGY:  Ct Abdomen Pelvis W Contrast  Result Date: 11/22/2016 CLINICAL DATA:  Metastatic  gastric cancer with abdominal pain and nausea. EXAM: CT ABDOMEN AND PELVIS WITH CONTRAST TECHNIQUE: Multidetector CT imaging of the abdomen and pelvis was performed using the standard protocol following bolus administration of intravenous contrast. CONTRAST:  196mL ISOVUE-300 IOPAMIDOL (ISOVUE-300) INJECTION 61%  COMPARISON:  09/26/2016 FINDINGS: Lower chest: Development of moderate left-sided pleural effusion with adjacent compressive atelectasis. Heart size is normal without pericardial effusion. Hepatobiliary: No focal liver lesions. Gallbladder is physiologically distended. No biliary dilatation. Pancreas: Normal without pancreatic mass, ductal dilatation or inflammation. Spleen: No splenic lesions or splenomegaly. Adrenals/Urinary Tract: Adrenal glands are unremarkable. Kidneys are normal, without renal calculi, focal lesion, or hydronephrosis. Bladder is unremarkable. Stomach/Bowel: The stomach is distended with enteric contrast. Slight anterior gastric wall thickening along the body and antrum. Scattered colonic diverticulosis without acute diverticulitis. No evidence of bowel obstruction. No free air or portal venous air. Vascular/Lymphatic: No abdominal aortic aneurysm. No gastrohepatic, portal venous, retroperitoneal, mesenteric or pelvic sidewall adenopathy. No inguinal adenopathy. Reproductive: Normal size prostate and seminal vesicles with central zone calcifications of the prostate. Other: Large volume of ascites though decreased in volume is subjectively from 09/26/2016 comparison. Mesenteric edema from the ascites is noted as well as the irregular omental thickening and caking anteriorly as before. Musculoskeletal: No aggressive appearing osseous lesions. Small fat containing umbilical hernia. IMPRESSION: 1. Development of new moderate left pleural effusion with adjacent compressive atelectasis. 2. Slight decrease in large volume of ascites within the abdomen and pelvis. 3. Omental caking and thickening again re- demonstrated with mild anterior gastric wall thickening consistent with patient's history of gastric cancer with metastasis. Electronically Signed   By: Ashley Royalty M.D.   On: 11/22/2016 20:20   US Abdomen Limited Ruq  Result Date: 11/23/2016 CLINICAL DATA:  RIGHT upper quadrant pain. EXAM:  ULTRASOUND ABDOMEN LIMITED RIGHT UPPER QUADRANT COMPARISON:  CT abdomen and pelvis 11/22/2016. Ultrasound-guided paracentesis 11/21/2016. FINDINGS: Gallbladder: No gallstones or wall thickening visualized. No sonographic Murphy sign noted by sonographer. Common bile duct: Diameter: 3.4 mm, normal Liver: No focal lesion identified. Within normal limits in parenchymal echogenicity. Portal vein is patent on color Doppler imaging with normal direction of blood flow towards the liver. Additional findings: Ascites. Compared with 11/21/16 sonography, ascitic fluid in the RIGHT upper quadrant has recurred. IMPRESSION: No gallstones or biliary ductal dilatation. Malignant ascites has recurred in the RIGHT upper quadrant. Electronically Signed   By: Staci Righter M.D.   On: 11/23/2016 09:42    EKG:   Orders placed or performed during the hospital encounter of 11/17/16  . EKG 12-Lead  . EKG 12-Lead  . ED EKG  . ED EKG  . 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  #1 community-acquired pneumonia-left lower lobe infiltrate noted on chest x-ray. -Blood cultures are negative. Currently on zosyn and azithromycin - for 5 days. -Rocephin changed to Zosyn for intra-abdominal coverage -Stop azithromycin after 5 days  #2 GI tract dysmotility- causing abdominal pain and nausea-has had intermittent abdominal pain associated with nausea likely secondary to his underlying malignancy. -Prior large-volume paracentesis, however paracentesis attempted to 3 weeks ago at [facility] emergency room and no fluid could be removed. This admission only 600 cc removed -Per oncology recommendations, added Zyprexa at bedtime and Decadron twice a day. -CT of the abdomen done, patient unable to drink the whole oral contrast. Did not show  any obstruction, distended  stomach noted though. -GI consult appreciated, s/p upper GI endoscopy today- showing ulcerated gastric cancer in stomach, significant GI tract dysmotility - unfortunate situation- not a candidate for PEG-J tube.. - started reglan QID, soft diet, protonix bid  #3 weight loss/failure to thrive-and able to eat due to increased abdominal pain and nausea -Nausea medications adjusted. Please see EGD report, poor prognosis -Nutritional status is poor.  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 -Follows up with Dr. Mike Gip, now seeing [facility] for a clinical trial to be started this week -Appreciate oncology input- Dr. Mike Gip will check with clinical trial co-ordinator to see if patient is a candidate or not at this time  #6 DVT prophylaxis- lovenox  Father at bedside updated as well.   All the records are reviewed and case discussed with Care Management/Social Workerr. Management plans discussed with the patient, family and they are in agreement.  CODE STATUS: Full code  TOTAL TIME TAKING CARE OF THIS PATIENT: 37 minutes.   POSSIBLE D/C 2-3 days, DEPENDING ON CLINICAL CONDITION.   Graceland Wachter M.D on 11/24/2016 at 1:45 PM  Between 7am to 6pm - Pager - [phone]  After 6pm go to [email or web address] - password [email or web address] [facility]  Office  [phone]  CC: Primary care physician; Patient, No Pcp Per

## 2016-11-24 NOTE — Anesthesia Preprocedure Evaluation (Signed)
Anesthesia Evaluation  Patient identified by MRN, date of birth, ID band Patient awake    Reviewed: Allergy & Precautions, NPO status , Patient's Chart, lab work & pertinent test results  History of Anesthesia Complications Negative for: history of anesthetic complications  Airway Mallampati: II  TM Distance: >3 FB Neck ROM: Full    Dental no notable dental hx.    Pulmonary neg sleep apnea, pneumonia (improving), neg COPD,    breath sounds clear to auscultation- rhonchi (-) wheezing      Cardiovascular Exercise Tolerance: Good (-) hypertension(-) CAD, (-) Past MI and (-) Cardiac Stents  Rhythm:Regular Rate:Normal - Systolic murmurs and - Diastolic murmurs    Neuro/Psych negative neurological ROS  negative psych ROS   GI/Hepatic Neg liver ROS, PUD, GERD  ,  Endo/Other  negative endocrine ROSneg diabetes  Renal/GU negative Renal ROS     Musculoskeletal negative musculoskeletal ROS (+)   Abdominal (+) - obese,   Peds  Hematology  (+) anemia ,   Anesthesia Other Findings Past Medical History: 08/2015: Gastric cancer ([place])     Comment:  Folfox chemo tx's. No date: GERD (gastroesophageal reflux disease)   Reproductive/Obstetrics                             Anesthesia Physical Anesthesia Plan  ASA: II  Anesthesia Plan: General   Post-op Pain Management:    Induction: Intravenous  PONV Risk Score and Plan: 1 and Propofol infusion  Airway Management Planned: Natural Airway  Additional Equipment:   Intra-op Plan:   Post-operative Plan:   Informed Consent: I have reviewed the patients History and Physical, chart, labs and discussed the procedure including the risks, benefits and alternatives for the proposed anesthesia with the patient or authorized representative who has indicated his/her understanding and acceptance.   Dental advisory given  Plan Discussed with: CRNA and  Anesthesiologist  Anesthesia Plan Comments:         Anesthesia Quick Evaluation

## 2016-11-24 NOTE — Anesthesia Postprocedure Evaluation (Signed)
Anesthesia Post Note  Patient: Gregori Abril  Procedure(s) Performed: ESOPHAGOGASTRODUODENOSCOPY (EGD) (N/A )  Patient location during evaluation: Endoscopy Anesthesia Type: General Level of consciousness: awake and alert and oriented Pain management: pain level controlled Vital Signs Assessment: post-procedure vital signs reviewed and stable Respiratory status: spontaneous breathing, nonlabored ventilation and respiratory function stable Cardiovascular status: blood pressure returned to baseline and stable Postop Assessment: no signs of nausea or vomiting Anesthetic complications: no     Last Vitals:  Vitals:   11/24/16 1250 11/24/16 1300  BP: (!) 81/54 94/65  Pulse: 84 79  Resp: (!) 25 (!) 22  Temp:    SpO2: 97% 97%    Last Pain:  Vitals:   11/24/16 1240  TempSrc: Tympanic  PainSc: Asleep                 Emely Fahy

## 2016-11-24 NOTE — Consult Note (Signed)
Pharmacy Antibiotic Note  Kristy Catoe is a 36 y.o. male admitted on 11/20/2016 with intra-abdominal infection.  Pharmacy has been consulted for zosyn dosing.  Plan: Zosyn 3.375g IV q8h (4 hour infusion).  Height: 5\' 5"  (165.1 cm) Weight: 163 lb 6.4 oz (74.1 kg) IBW/kg (Calculated) : 61.5  Temp (24hrs), Avg:97.9 F (36.6 C), Min:97.5 F (36.4 C), Max:98.5 F (36.9 C)   Recent Labs Lab 11/17/16 1345 11/20/16 1856 11/20/16 2056 11/21/16 0405 11/22/16 0330 11/23/16 0404 11/24/16 0351  WBC 10.0 9.9  --  8.8  --   --  15.2*  CREATININE 0.68 0.76  --  0.56* 0.59* 0.67 0.63  LATICACIDVEN  --   --  1.2  --   --   --   --     Estimated Creatinine Clearance: 117.8 mL/min (by C-G formula based on SCr of 0.63 mg/dL).    Allergies  Allergen Reactions  . Iodine     Possible anaphylaxis per MD Per conversation with pt, pt denies allergy to IV contrast.  Pt injected 09/26/16 without premeds, pt with no reaction.    Antimicrobials this admission: ceftriaxone 10/18 >> 10/19 azithromycin 10/19 >>  Meropenem 10/19>>10/19 Vancomycin 10/18>>10/19 Zosyn 10/20>>  Dose adjustments this admission:   Microbiology results: 10/18 BCx: NG 4 days 10/19 peritoneal fluid: no organisms seen  Thank you for allowing pharmacy to be a part of this patient's care.  Rayna Sexton, PharmD, BCPS Clinical Pharmacist 11/24/2016 10:10 AM

## 2016-11-24 NOTE — Progress Notes (Signed)
[facility] Hematology/Oncology Progress Note  Date of admission: 11/20/2016  Hospital day:  11/24/2016  Chief Complaint: Anthony Melendez is a 36 y.o. male with metastatic gastric cancer who was admitted with abdominal pain, anorexia, and weight loss.  Subjective:  Patient sleeping.  History provided by patient's sister.  Social History: The patient is accompanied by 9 family memebers today.  Allergies:  Allergies  Allergen Reactions  . Iodine     Possible anaphylaxis per MD Per conversation with pt, pt denies allergy to IV contrast.  Pt injected 09/26/16 without premeds, pt with no reaction.    Scheduled Medications: . calcium carbonate  1 tablet Oral TID WC  . calcium carbonate  1 tablet Oral Once  . dexamethasone  10 mg Intravenous Q12H  . enoxaparin (LOVENOX) injection  40 mg Subcutaneous Q24H  . feeding supplement (ENSURE ENLIVE)  237 mL Oral BID BM  . metoCLOPramide (REGLAN) injection  10 mg Intravenous TID AC & HS  . nystatin  5 mL Oral QID  . OLANZapine  10 mg Oral QHS  . pantoprazole  40 mg Oral Daily    Review of Systems: History provided by patient's sister. GENERAL:  Fatigue.  No fevers or sweats.  Weight loss. PERFORMANCE STATUS (ECOG):  3 HEENT:  No visual changes, runny nose, sore throat, mouth sores or tenderness. Lungs: No shortness of breath or cough.  No hemoptysis. Cardiac:  No chest pain, palpitations, orthopnea, or PND. GI:  Nausea and vomiting.  Poor oral intake for 2-3 weeks.  No diarrhea, constipation, melena or hematochezia. GU:  No urgency, frequency, dysuria, or hematuria. Musculoskeletal:  No back pain.  No joint pain.  No muscle tenderness. Extremities:  No pain or swelling. Skin:  No rashes or skin changes. Neuro:  No headache, numbness or weakness, balance or coordination issues. Endocrine:  No diabetes, thyroid issues, hot flashes or night sweats. Psych:  No mood changes, depression or anxiety. Pain:  No focal  pain. Review of systems:  All other systems reviewed and found to be negative.  Physical Exam: Blood pressure 104/72, pulse (!) 102, temperature 97.9 F (36.6 C), temperature source Oral, resp. rate 19, height '5\' 5"'$  (1.651 m), weight 163 lb 6.4 oz (74.1 kg), SpO2 93 %.  GENERAL:  Chronically ill appearing gentleman sleeping comfortably on the medical unit in no acute distress. MENTAL STATUS:  Asleep.  Open eyes briefly. HEAD:  Thin dark hair.  Normocephalic, atraumatic, face symmetric, no Cushingoid features. EYES:  Brown eyes.  Pupils equal round.  No conjunctivitis or scleral icterus. RESPIRATORY:  Clear to auscultation anteriorly without rales, wheezes or rhonchi. CARDIOVASCULAR:  Regular rate and rhythm without murmur, rub or gallop. ABDOMEN:  Soft, slightly distended, minimally tender, with active bowel sounds, and no hepatosplenomegaly.  No masses. SKIN:  No rashes, ulcers or lesions. EXTREMITIES: No edema, no skin discoloration or tenderness.  No palpable cords. LYMPH NODES: No palpable cervical, supraclavicular, axillary or inguinal adenopathy. NEUROLOGICAL: Sleeping.   Results for orders placed or performed during the hospital encounter of 11/20/16 (from the past 48 hour(s))  Basic metabolic panel     Status: Abnormal   Collection Time: 11/23/16  4:04 AM  Result Value Ref Range   Sodium 136 135 - 145 mmol/L   Potassium 4.0 3.5 - 5.1 mmol/L   Chloride 105 101 - 111 mmol/L   CO2 19 (L) 22 - 32 mmol/L   Glucose, Bld 170 (H) 65 - 99 mg/dL   BUN 7 6 -  20 mg/dL   Creatinine, Ser 0.67 0.61 - 1.24 mg/dL   Calcium 8.8 (L) 8.9 - 10.3 mg/dL   GFR calc non Af Amer >60 >60 mL/min   GFR calc Af Amer >60 >60 mL/min    Comment: (NOTE) The eGFR has been calculated using the CKD EPI equation. This calculation has not been validated in all clinical situations. eGFR's persistently <60 mL/min signify possible Chronic Kidney Disease.    Anion gap 12 5 - 15  C difficile quick scan w PCR  reflex     Status: None   Collection Time: 11/23/16  3:16 PM  Result Value Ref Range   C Diff antigen NEGATIVE NEGATIVE   C Diff toxin NEGATIVE NEGATIVE   C Diff interpretation No C. difficile detected.   Gastrointestinal Panel by PCR , Stool     Status: None   Collection Time: 11/23/16  3:16 PM  Result Value Ref Range   Campylobacter species NOT DETECTED NOT DETECTED   Plesimonas shigelloides NOT DETECTED NOT DETECTED   Salmonella species NOT DETECTED NOT DETECTED   Yersinia enterocolitica NOT DETECTED NOT DETECTED   Vibrio species NOT DETECTED NOT DETECTED   Vibrio cholerae NOT DETECTED NOT DETECTED   Enteroaggregative E coli (EAEC) NOT DETECTED NOT DETECTED   Enteropathogenic E coli (EPEC) NOT DETECTED NOT DETECTED   Enterotoxigenic E coli (ETEC) NOT DETECTED NOT DETECTED   Shiga like toxin producing E coli (STEC) NOT DETECTED NOT DETECTED   Shigella/Enteroinvasive E coli (EIEC) NOT DETECTED NOT DETECTED   Cryptosporidium NOT DETECTED NOT DETECTED   Cyclospora cayetanensis NOT DETECTED NOT DETECTED   Entamoeba histolytica NOT DETECTED NOT DETECTED   Giardia lamblia NOT DETECTED NOT DETECTED   Adenovirus F40/41 NOT DETECTED NOT DETECTED   Astrovirus NOT DETECTED NOT DETECTED   Norovirus GI/GII NOT DETECTED NOT DETECTED   Rotavirus A NOT DETECTED NOT DETECTED   Sapovirus (I, II, IV, and V) NOT DETECTED NOT DETECTED  CBC     Status: Abnormal   Collection Time: 11/24/16  3:51 AM  Result Value Ref Range   WBC 15.2 (H) 3.8 - 10.6 K/uL   RBC 3.84 (L) 4.40 - 5.90 MIL/uL   Hemoglobin 9.3 (L) 13.0 - 18.0 g/dL   HCT 29.0 (L) 40.0 - 52.0 %   MCV 75.5 (L) 80.0 - 100.0 fL   MCH 24.4 (L) 26.0 - 34.0 pg   MCHC 32.3 32.0 - 36.0 g/dL   RDW 15.6 (H) 11.5 - 14.5 %   Platelets 282 150 - 440 K/uL  Comprehensive metabolic panel     Status: Abnormal   Collection Time: 11/24/16  3:51 AM  Result Value Ref Range   Sodium 138 135 - 145 mmol/L   Potassium 3.5 3.5 - 5.1 mmol/L   Chloride 109 101 -  111 mmol/L   CO2 24 22 - 32 mmol/L   Glucose, Bld 207 (H) 65 - 99 mg/dL   BUN 8 6 - 20 mg/dL   Creatinine, Ser 0.63 0.61 - 1.24 mg/dL   Calcium 8.6 (L) 8.9 - 10.3 mg/dL   Total Protein 6.7 6.5 - 8.1 g/dL   Albumin 2.7 (L) 3.5 - 5.0 g/dL   AST 22 15 - 41 U/L   ALT 12 (L) 17 - 63 U/L   Alkaline Phosphatase 90 38 - 126 U/L   Total Bilirubin 0.7 0.3 - 1.2 mg/dL   GFR calc non Af Amer >60 >60 mL/min   GFR calc Af Amer >60 >60 mL/min  Comment: (NOTE) The eGFR has been calculated using the CKD EPI equation. This calculation has not been validated in all clinical situations. eGFR's persistently <60 mL/min signify possible Chronic Kidney Disease.    Anion gap 5 5 - 15   US Abdomen Limited Ruq  Result Date: 11/23/2016 CLINICAL DATA:  RIGHT upper quadrant pain. EXAM: ULTRASOUND ABDOMEN LIMITED RIGHT UPPER QUADRANT COMPARISON:  CT abdomen and pelvis 11/22/2016. Ultrasound-guided paracentesis 11/21/2016. FINDINGS: Gallbladder: No gallstones or wall thickening visualized. No sonographic Murphy sign noted by sonographer. Common bile duct: Diameter: 3.4 mm, normal Liver: No focal lesion identified. Within normal limits in parenchymal echogenicity. Portal vein is patent on color Doppler imaging with normal direction of blood flow towards the liver. Additional findings: Ascites. Compared with 11/21/16 sonography, ascitic fluid in the RIGHT upper quadrant has recurred. IMPRESSION: No gallstones or biliary ductal dilatation. Malignant ascites has recurred in the RIGHT upper quadrant. Electronically Signed   By: Staci Righter M.D.   On: 11/23/2016 09:42    Assessment:  Anthony Melendez is a 36 y.o. male with stage IV gastric cancer who was admitted with chronic nausea, vomiting, decreased oral intake, and weight loss.  He initially presented in 07/2015.  Disease has progressed following FOLFOX (08/14/2015 - 02/05/2016), ramucirumab + Taxol (05/01/2016 - 08/20/2016), and pembrolizumab (09/09/2016 -  09/30/2016).  Until recently, he has required large volume paracentesis secondary to malignant ascites.  He has been seen at [facility] for clinical trial enrollment ([id number]).  Treatment postponed from 10/15 to 11/25/2016 secondary to ER visit at [facility] with abdominal pain, nausea and vomiting.   EGD on 11/24/2016 revealed one non-obstructing oozing cratered gastric ulcer of significant severity along the greater curvature of the stomach. There was no evidence of perforation. Findings were consistent with gastric carcinoma, size is increased compared to EGD from 02/2016.  There was a large amount of bilious fluid in the gastric fundus, gastric body, middle and lower third of the esophagus.  Findings were highly concerning for significant dysmotility of the GI tract explaining patient's symptoms of intractable hiccups, nausea and vomiting.  Plan: 1.  Oncology:  Metastatic gastric cancer with peritoneal carcinomatosis.  Awaiting enrollment on clinical trial.  I have called [facility] several times today.  Dr. Donalynn Furlong to return my call in the morning.  Unclear if patient will remain a candidate for the upcoming clinical trial.  He has already been postponed a week.   If patient not a candididate for clinical trial, he would be a candidate for palliative care/Hospice.  2.  Gastroenterology:  EGD reveals an enlarging non-obstructive gastric ulcer.  Patient appears to have significant dysmotility.  Agree with trial of Reglan.  Patient has had no significant nausea relief with ondansetron and compazine.  Olanzapine recently added.  Patient not a candidate for PEG tube placement.  Agree that TPN is not a viable option given advancing malignancy unless [facility] feels appropriate for a "bridge" to see if he is responsive to treatment.  3.  Disposition:  Await call back from [facility] in AM.  Will discuss further with patient and family in AM with interpreter present.  Plan to discuss code status.  Agree with palliative care  consult.   Addendum:  Over 25 minutes was spent with the patient and his family answering numerous questions.   Lequita Asal, MD  11/24/2016, 7:58 PM

## 2016-11-24 NOTE — Transfer of Care (Signed)
Immediate Anesthesia Transfer of Care Note  Patient: Anthony Melendez  Procedure(s) Performed: ESOPHAGOGASTRODUODENOSCOPY (EGD) (N/A )  Patient Location: PACU  Anesthesia Type:General  Level of Consciousness: drowsy and patient cooperative  Airway & Oxygen Therapy: Patient Spontanous Breathing and Patient connected to nasal cannula oxygen  Post-op Assessment: Report given to RN and Post -op Vital signs reviewed and stable  Post vital signs: Reviewed and stable  Last Vitals:  Vitals:   11/24/16 1205 11/24/16 1240  BP: 114/80 (!) 81/53  Pulse: (!) 109 91  Resp: 16 (!) 29  Temp: 36.8 C (!) 36 C  SpO2: 98% 97%    Last Pain:  Vitals:   11/24/16 1240  TempSrc: Tympanic  PainSc: Asleep         Complications: No apparent anesthesia complications

## 2016-11-24 NOTE — Progress Notes (Signed)
Nutrition Follow-up  DOCUMENTATION CODES:   Not applicable  INTERVENTION:  Recommend TPN  Patient not a candidate for PEG-J per GI  Monitor GOC, discussed with Dr. Tressia Miners  Continue Ensure Enlive po BID, each supplement provides 350 kcal and 20 grams of protein with diet advancement  NUTRITION DIAGNOSIS:   Increased nutrient needs related to chronic illness as evidenced by estimated needs. 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 Presents with abd pain, nausea,vomiting, weight loss, HCAP. Underwent paracentesis today, 636mL fluid pulled.  Patient now s/p endoscopy. Continues with nausea/vomiting. Not meeting nutritional needs. Endoscopy concerning for dysmotility of GI tract. Not a candidate for PEG-J per GI. Discussed with Dr. Tressia Miners Monitor PO intake, soft diet recommended per GI.  Labs reviewed Medications reviewed and include:  Tums, Decadron  NS at 27mL/hr NS 20K+ at 29mL/hr  Diet Order:  Diet NPO time specified  Skin:  Reviewed, no issues  Last BM:  PTA  Height:   Ht Readings from Last 1 Encounters:  11/20/16 5\' 5"  (1.651 m)    Weight:   Wt Readings from Last 1 Encounters:  11/21/16 163 lb 6.4 oz (74.1 kg)    Ideal Body Weight:  61.81 kg  BMI:  Body mass index is 27.19 kg/m.  Estimated Nutritional Needs:   Kcal:  2222-2600 calories (30-35 cal/kg)  Protein:  96-126 grams (1.3-1.7g/kg)  Fluid:  2.2-2.6L  EDUCATION NEEDS:   No education needs identified at this time  Satira Anis. Beatric Fulop, MS, RD LDN Inpatient Clinical Dietitian Pager [phone]

## 2016-11-24 NOTE — Progress Notes (Signed)
Assumed care of patient at [place]. Pt awake with family at bedside. Remains NPO for EGD.

## 2016-11-24 NOTE — Anesthesia Procedure Notes (Signed)
Date/Time: 11/24/2016 12:22 PM Performed by: Darlyne Russian Pre-anesthesia Checklist: Patient identified, Emergency Drugs available, Suction available, Patient being monitored and Timeout performed Patient Re-evaluated:Patient Re-evaluated prior to induction Oxygen Delivery Method: Nasal cannula Placement Confirmation: positive ETCO2

## 2016-11-24 NOTE — Progress Notes (Signed)
One episode of emesis this evening. Iv infusing without difficulty. Pt instructed to be NPO after midnight for EGD tomorrow. Report given to [facility] she is to resume care.

## 2016-11-24 NOTE — Anesthesia Post-op Follow-up Note (Signed)
Anesthesia QCDR form completed.        

## 2016-11-24 NOTE — Plan of Care (Signed)
Patient IV patent. CHG x2 completed.  Consent signed.  Family agreed to accompany patient to waitroom.  Spanish translator requested to assist as needed.  Vitals stable. NPO since midnight, AM PO meds held.  Personal clothing and jewelry removed.  Report given to endo.

## 2016-11-24 NOTE — Progress Notes (Signed)
EGD post procedure note:  Findings: - The duodenal bulb and second portion of the duodenum were normal, moderate amounts of bilious fluid seen refluxing back into D2.  - One non-obstructing oozing cratered gastric ulcer of significant severity was found all along the greater curvature of the stomach.  There is no evidence of perforation.This finding is consistent with pt's known h/o gastric carcinoma, size is increased compared to EGD from 02/2016 - Large amounts of Bilious fluid was found in the gastric fundus and in the gastric body which was aspirated  - Large amounts of bile filling the middle third of the esophagus and in the lower third of the esophagus, which was aspirated, the underlying esophageal mucosa is friable and erythematous due to stasis.  - There was no peristalsis of the stomach or esophagus, unable to inflate fundus on retroflexion  - There is no evidence of gastric outlet obstruction  Recs: - Return patient to hospital ward for ongoing care.  - Can start soft diet as tolerated, meeting nutrition goals will be challenging,and not a candidate for PEG-J due to peritoneal carcinomatosis - Continue present medications.  - Prilosec 40mg  BID  - Overall prognosis is gaurded - Defer to oncology about further goals of care  Cephas Darby, MD [address]  [place]  [place], [place] [address]  Main: [phone]  Fax: [phone] Pager: [phone]

## 2016-11-25 ENCOUNTER — Encounter: Payer: Self-pay | Admitting: Gastroenterology

## 2016-11-25 LAB — BODY FLUID CULTURE: Culture: NO GROWTH

## 2016-11-25 LAB — CULTURE, BLOOD (ROUTINE X 2)
CULTURE: NO GROWTH
CULTURE: NO GROWTH
SPECIAL REQUESTS: ADEQUATE
Special Requests: ADEQUATE

## 2016-11-25 MED ORDER — LIDOCAINE VISCOUS 2 % MT SOLN
5.0000 mL | Freq: Three times a day (TID) | OROMUCOSAL | Status: DC | PRN
  Filled 2016-11-25 (×4): qty 15

## 2016-11-25 MED ORDER — PNEUMOCOCCAL VAC POLYVALENT 25 MCG/0.5ML IJ INJ
0.5000 mL | INJECTION | INTRAMUSCULAR | Status: DC
  Filled 2016-11-25 (×2): qty 0.5

## 2016-11-25 MED ORDER — SODIUM CHLORIDE 0.9 % IV SOLN: 250.0000 mg | Freq: Three times a day (TID) | INTRAVENOUS | Status: DC

## 2016-11-25 MED ORDER — MAGIC MOUTHWASH
5.0000 mL | Freq: Three times a day (TID) | ORAL | Status: DC | PRN
  Administered 2016-11-25 – 2016-11-27 (×4): 5 mL via ORAL
  Filled 2016-11-25 (×2): qty 5
  Filled 2016-11-25 (×7): qty 10

## 2016-11-25 MED ORDER — SODIUM CHLORIDE 0.9 % IV SOLN
25.0000 mg | Freq: Four times a day (QID) | INTRAVENOUS | Status: DC | PRN
  Administered 2016-11-25 – 2016-12-01 (×14): 25 mg via INTRAVENOUS
  Filled 2016-11-25 (×16): qty 1

## 2016-11-25 MED ORDER — PANTOPRAZOLE SODIUM 40 MG IV SOLR
40.0000 mg | INTRAVENOUS | Status: DC
  Administered 2016-11-25 – 2016-12-04 (×10): 40 mg via INTRAVENOUS
  Filled 2016-11-25 (×10): qty 40

## 2016-11-25 MED ORDER — MAGIC MOUTHWASH W/LIDOCAINE: 5.0000 mL | Freq: Three times a day (TID) | ORAL | Status: DC | PRN

## 2016-11-25 NOTE — Progress Notes (Signed)
[place] [facility] [place] NAME: Anthony Melendez    MR#:  [id number]  DATE OF BIRTH:  12-10-1980  SUBJECTIVE:  CHIEF COMPLAINT:   Chief Complaint  Patient presents with  . Abdominal Pain   - Status post EGD and showing increase in size of the gastric ulcerative tumorous lesion. -Patient feels miserable, continues to have nausea and vomiting. Still has abdominal pain and a firm abdomen  REVIEW OF SYSTEMS:  Review of Systems  Constitutional: Positive for malaise/fatigue and weight loss. Negative for chills and fever.  HENT: Negative for congestion, ear discharge, hearing loss and nosebleeds.   Eyes: Negative for blurred vision and double vision.  Respiratory: Negative for cough, shortness of breath and wheezing.   Cardiovascular: Negative for chest pain, palpitations and leg swelling.  Gastrointestinal: Positive for abdominal pain, nausea and vomiting. Negative for constipation and diarrhea.  Genitourinary: Negative for dysuria and urgency.  Musculoskeletal: Positive for back pain. Negative for myalgias.  Neurological: Negative for dizziness, seizures and headaches.  Psychiatric/Behavioral: Negative for depression.    DRUG ALLERGIES:   Allergies  Allergen Reactions  . Iodine     Possible anaphylaxis per MD Per conversation with pt, pt denies allergy to IV contrast.  Pt injected 09/26/16 without premeds, pt with no reaction.    VITALS:  Blood pressure (!) 127/96, pulse (!) 116, temperature 98.5 F (36.9 C), temperature source Oral, resp. rate 18, height 5\' 5"  (1.651 m), weight 74.1 kg (163 lb 6.4 oz), SpO2 97 %.  PHYSICAL EXAMINATION:  Physical Exam  GENERAL:  36 y.o.-year-old ill-nourished patient lying in the bed, appears miserable due to constant nausea vomiting and also he cuts. EYES: Pupils equal, round, reactive to light and accommodation. No scleral icterus. Extraocular muscles intact.  HEENT: Head atraumatic, normocephalic.  Oropharynx and nasopharynx clear.  NECK:  Supple, no jugular venous distention. No thyroid enlargement, no tenderness.  LUNGS: Normal breath sounds bilaterally, no wheezing, rales,rhonchi or crepitation. No use of accessory muscles of respiration. Decreased bibasilar breath sounds CARDIOVASCULAR: S1, S2 normal. No murmurs, rubs, or gallops.  ABDOMEN: Soft, nontender but discomfort on palpation in left and right upper quadrant, distended and tight/firm. Hypoactive Bowel sounds present. No organomegaly or mass.  EXTREMITIES: No pedal edema, cyanosis, or clubbing.  NEUROLOGIC: Cranial nerves II through XII are intact. Muscle strength 5/5 in all extremities. Sensation intact. Gait not checked.  PSYCHIATRIC: The patient is alert and oriented x 3.  SKIN: No obvious rash, lesion, or ulcer.    LABORATORY PANEL:   CBC  Recent Labs Lab 11/24/16 0351  WBC 15.2*  HGB 9.3*  HCT 29.0*  PLT 282   ------------------------------------------------------------------------------------------------------------------  Chemistries   Recent Labs Lab 11/22/16 1412  11/24/16 0351  NA  --   < > 138  K  --   < > 3.5  CL  --   < > 109  CO2  --   < > 24  GLUCOSE  --   < > 207*  BUN  --   < > 8  CREATININE  --   < > 0.63  CALCIUM  --   < > 8.6*  MG 1.5*  --   --   AST  --   --  22  ALT  --   --  12*  ALKPHOS  --   --  90  BILITOT  --   --  0.7  < > = values in this interval not displayed. ------------------------------------------------------------------------------------------------------------------  Cardiac Enzymes No results for input(s): TROPONINI in the last 168 hours. ------------------------------------------------------------------------------------------------------------------  RADIOLOGY:  No results found.  EKG:   Orders placed or performed during the hospital encounter of 11/17/16  . EKG 12-Lead  . EKG 12-Lead  . ED EKG  . ED EKG  . EKG    ASSESSMENT AND PLAN:    36 year old male with past medical history significant for metastatic gastric cancer status post large volume paracentesis presents to hospital secondary to abdominal pain and nausea.  #1 community-acquired pneumonia-left lower lobe infiltrate noted on chest x-ray. -Blood cultures are negative. Currently on zosyn. -Rocephin changed to Zosyn for intra-abdominal coverage  #2 GI tract dysmotility- causing abdominal pain and nausea-has had intermittent abdominal pain associated with nausea likely secondary to his underlying malignancy. -Prior large-volume paracentesis, however paracentesis attempted to 3 weeks ago at [facility] emergency room and no fluid could be removed. This admission only 600 cc removed -Per oncology recommendations, added Zyprexa at bedtime and Decadron twice a day. -CT of the abdomen done, patient unable to drink the whole oral contrast. Did not show any obstruction, distended stomach noted though. -GI consult appreciated, s/p upper GI endoscopy on 11/24/16 - showing increased ulcerated gastric cancer in stomach, significant GI tract dysmotility - unfortunate situation- not a candidate for PEG-J tube.. - started reglan QID, soft diet, protonix bid -Erythro-mycin will be added for increasing GI motility. On several medications that can cause QTC prolongation. Check EKG  #3 weight loss/failure to thrive-and able to eat due to increased abdominal pain and nausea -Nausea medications adjusted. Please see EGD report, poor prognosis -Nutritional status is poor.  -Pending too cannot be placed. TPN can only be used for bridging if there is a chance with clinical trial. - discussed with his primary oncologist- if not a candidate for clinical trials at [facility]- will get palliative care  #4 hypokalemia and hypomagnesemia-replaced  #5 metastatic gastric carcinoma-diagnosed in June 2017, metastases to the omentum and also malignant ascites. -Follows up with Dr. Mike Gip, now seeing [facility] for a  clinical trial to be started this week -Appreciate oncology input-[facility] to get back about his clinical trials status. If patient is a candidate, recommend transferred to [facility] for symptomatic treatment prior to starting on a trial. -Patient is not a candidate, then he will need hospice according to oncology. Palliative care consulted  #6 DVT prophylaxis- lovenox  Father and sister at bedside updated as well. His oncologist was present as well for the conversation. Communication through an interpreter today   All the records are reviewed and case discussed with Care Management/Social Workerr. Management plans discussed with the patient, family and they are in agreement.  CODE STATUS: Full code  TOTAL TIME TAKING CARE OF THIS PATIENT: 42  minutes.   POSSIBLE D/C ? days, DEPENDING ON CLINICAL CONDITION.   Gladstone Lighter M.D on 11/25/2016 at 9:25 AM  Between 7am to 6pm - Pager - [phone]  After 6pm go to [email or web address] - password [email or web address] [place] [facility]  Office  [phone]  CC: Primary care physician; Patient, No Pcp Per

## 2016-11-25 NOTE — Progress Notes (Signed)
Patient to transfer to 1C-Oncology. Report called to Isaiah Serge, RN. Family at bedside made aware. Madlyn Frankel, RN

## 2016-11-25 NOTE — Progress Notes (Signed)
[facility] Hematology/Oncology Progress Note  Date of admission: 11/20/2016  Hospital day:  11/25/2016  Chief Complaint: Anthony Melendez is a 36 y.o. male with metastatic gastric cancer who was admitted with abdominal pain, anorexia, and weight loss.  Subjective:  Patient notes ogoing epigastric pain, nausea and vomiting.  Social History: The patient is accompanied by several family members, the interpreter, and Dr. Tressia Miners today.  Allergies:  Allergies  Allergen Reactions  . Iodine     Possible anaphylaxis per MD Per conversation with pt, pt denies allergy to IV contrast.  Pt injected 09/26/16 without premeds, pt with no reaction.    Scheduled Medications: . calcium carbonate  1 tablet Oral TID WC  . calcium carbonate  1 tablet Oral Once  . dexamethasone  10 mg Intravenous Q12H  . enoxaparin (LOVENOX) injection  40 mg Subcutaneous Q24H  . feeding supplement (ENSURE ENLIVE)  237 mL Oral BID BM  . metoCLOPramide (REGLAN) injection  10 mg Intravenous TID AC & HS  . nystatin  5 mL Oral QID  . OLANZapine  10 mg Oral QHS  . pantoprazole  40 mg Oral Daily    Review of Systems: GENERAL:  Fatigue.  No fevers or sweats.  Ongoing weight loss. PERFORMANCE STATUS (ECOG):  3 HEENT:  No visual changes, runny nose, sore throat, mouth sores or tenderness. Lungs: No shortness of breath or cough.  No hemoptysis. Cardiac:  No chest pain, palpitations, orthopnea, or PND. GI:  Nausea and vomiting.  Minimal oral intake for 2-3 weeks.  Epigastric pain.  No diarrhea, constipation, melena or hematochezia. GU:  No urgency, frequency, dysuria, or hematuria. Musculoskeletal:  No back pain.  No joint pain.  No muscle tenderness. Extremities:  No pain or swelling. Skin:  No rashes or skin changes. Neuro:  No headache, numbness or weakness, balance or coordination issues. Endocrine:  No diabetes, thyroid issues, hot flashes or night sweats. Psych:  No mood changes, depression or  anxiety. Pain:  Epigastric pain. Review of systems:  All other systems reviewed and found to be negative.  Physical Exam: Blood pressure (!) 127/96, pulse (!) 116, temperature 98.5 F (36.9 C), temperature source Oral, resp. rate 18, height _0  (1.651 m), weight 163 lb 6.4 oz (74.1 kg), SpO2 97 %.  GENERAL:  Chronically ill appearing gentleman sitting up on the edge of his bed intermittently vomiting. MENTAL STATUS:  Alert to person, place, and time. HEAD:  Thin dark hair.  Normocephalic, atraumatic, face symmetric, no Cushingoid features. EYES:  Brown eyes.  Pupils equal round.  No conjunctivitis or scleral icterus. ENT:  No oral lesions.  No thrush. RESPIRATORY:  Clear to auscultation anteriorly without rales, wheezes or rhonchi. CARDIOVASCULAR:  Tachycardic.  Regular rate and rhythm without murmur, rub or gallop. ABDOMEN:  Soft, minimally tender to deep palpation in the epigastric region.  Firm epigastric area.  Active bowel sounds and no hepatosplenomegaly.   SKIN:  No rashes, ulcers or lesions. EXTREMITIES: No edema, no skin discoloration or tenderness.  No palpable cords. NEUROLOGICAL: Appropriate.   Results for orders placed or performed during the hospital encounter of 11/20/16 (from the past 48 hour(s))  C difficile quick scan w PCR reflex     Status: None   Collection Time: 11/23/16  3:16 PM  Result Value Ref Range   C Diff antigen NEGATIVE NEGATIVE   C Diff toxin NEGATIVE NEGATIVE   C Diff interpretation No C. difficile detected.   Gastrointestinal Panel by PCR , Stool  Status: None   Collection Time: 11/23/16  3:16 PM  Result Value Ref Range   Campylobacter species NOT DETECTED NOT DETECTED   Plesimonas shigelloides NOT DETECTED NOT DETECTED   Salmonella species NOT DETECTED NOT DETECTED   Yersinia enterocolitica NOT DETECTED NOT DETECTED   Vibrio species NOT DETECTED NOT DETECTED   Vibrio cholerae NOT DETECTED NOT DETECTED   Enteroaggregative E coli (EAEC) NOT  DETECTED NOT DETECTED   Enteropathogenic E coli (EPEC) NOT DETECTED NOT DETECTED   Enterotoxigenic E coli (ETEC) NOT DETECTED NOT DETECTED   Shiga like toxin producing E coli (STEC) NOT DETECTED NOT DETECTED   Shigella/Enteroinvasive E coli (EIEC) NOT DETECTED NOT DETECTED   Cryptosporidium NOT DETECTED NOT DETECTED   Cyclospora cayetanensis NOT DETECTED NOT DETECTED   Entamoeba histolytica NOT DETECTED NOT DETECTED   Giardia lamblia NOT DETECTED NOT DETECTED   Adenovirus F40/41 NOT DETECTED NOT DETECTED   Astrovirus NOT DETECTED NOT DETECTED   Norovirus GI/GII NOT DETECTED NOT DETECTED   Rotavirus A NOT DETECTED NOT DETECTED   Sapovirus (I, II, IV, and V) NOT DETECTED NOT DETECTED  CBC     Status: Abnormal   Collection Time: 11/24/16  3:51 AM  Result Value Ref Range   WBC 15.2 (H) 3.8 - 10.6 K/uL   RBC 3.84 (L) 4.40 - 5.90 MIL/uL   Hemoglobin 9.3 (L) 13.0 - 18.0 g/dL   HCT 29.0 (L) 40.0 - 52.0 %   MCV 75.5 (L) 80.0 - 100.0 fL   MCH 24.4 (L) 26.0 - 34.0 pg   MCHC 32.3 32.0 - 36.0 g/dL   RDW 15.6 (H) 11.5 - 14.5 %   Platelets 282 150 - 440 K/uL  Comprehensive metabolic panel     Status: Abnormal   Collection Time: 11/24/16  3:51 AM  Result Value Ref Range   Sodium 138 135 - 145 mmol/L   Potassium 3.5 3.5 - 5.1 mmol/L   Chloride 109 101 - 111 mmol/L   CO2 24 22 - 32 mmol/L   Glucose, Bld 207 (H) 65 - 99 mg/dL   BUN 8 6 - 20 mg/dL   Creatinine, Ser 0.63 0.61 - 1.24 mg/dL   Calcium 8.6 (L) 8.9 - 10.3 mg/dL   Total Protein 6.7 6.5 - 8.1 g/dL   Albumin 2.7 (L) 3.5 - 5.0 g/dL   AST 22 15 - 41 U/L   ALT 12 (L) 17 - 63 U/L   Alkaline Phosphatase 90 38 - 126 U/L   Total Bilirubin 0.7 0.3 - 1.2 mg/dL   GFR calc non Af Amer >60 >60 mL/min   GFR calc Af Amer >60 >60 mL/min    Comment: (NOTE) The eGFR has been calculated using the CKD EPI equation. This calculation has not been validated in all clinical situations. eGFR's persistently <60 mL/min signify possible Chronic  Kidney Disease.    Anion gap 5 5 - 15   No results found.  Assessment:  Anthony Melendez is a 36 y.o. male with stage IV gastric cancer who was admitted with chronic nausea, vomiting, decreased oral intake, and weight loss.  He initially presented in 07/2015.  Disease has progressed following FOLFOX (08/14/2015 - 02/05/2016), ramucirumab + Taxol (05/01/2016 - 08/20/2016), and pembrolizumab (09/09/2016 - 09/30/2016).  Until recently, he has required large volume paracentesis secondary to malignant ascites.  He has been seen at [facility] for clinical trial enrollment ([id number]).  Treatment postponed from 10/15 to 11/25/2016 secondary to ER visit at [facility] with abdominal pain, nausea and  vomiting.   EGD on 11/24/2016 revealed one non-obstructing oozing cratered gastric ulcer of significant severity along the greater curvature of the stomach. There was no evidence of perforation. Findings were consistent with gastric carcinoma, size is increased compared to EGD from 02/2016.  There was a large amount of bilious fluid in the gastric fundus, gastric body, middle and lower third of the esophagus.  Findings were highly concerning for significant dysmotility of the GI tract explaining patient's symptoms of intractable hiccups, nausea and vomiting.  Plan: 1.  Oncology:  Metastatic gastric cancer with peritoneal carcinomatosis.  Spoke with Dr. Donalynn Furlong late this afternoon.  Last day for initiation of treatment on trial was today.  He is off study.  There are no other clinical trials.  There are no other standard chemotherapy options.  Unfortunately, he is a candidate for palliative care/Hospice.  2.  Gastroenterology:  EGD reveals an enlarging non-obstructive gastric ulcer.  Patient appears to have significant dysmotility.  Trial of Reglan has been unsuccessful. He has had no significant nausea relief with ondansetron and compazine.  Olanzapine recently added.  Discussed possible erythromycin (shortage per  Dr. Tressia Miners).  Doubt patient is a candidate for drainage gastrostomy and feeding jejunostomy tube.  Discuss with GI.  Agree that TPN is not a viable option given his advancing malignancy.  3.  Disposition:  Multiple issues were discussed with the patient and his family today with the assistance of the interpreter.  I spoke with [facility] after the meeting with the family.  Will need to discuss code status as patient is a Hospice candidate.  Agree with palliative care consult.    Lequita Asal, MD  11/25/2016, 1:48 PM

## 2016-11-25 NOTE — Progress Notes (Signed)
Patient transferred to 1C - room 114. Family remain at bedside. Patient is resting quietly for the first time this shift. Assuming RN made aware. Madlyn Frankel, RN

## 2016-11-25 NOTE — Consult Note (Signed)
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 He sees Dr. Mike Gip as an outpatient. He has progressed through FOLFOX, cyramza taxol and most recently [place]. He was seen at [facility] and was being considered for clinical trial.   Clinical Assessment and Goals of Care: Met with patient and sister using hospital  Interpretor. He is spitting up bilious fluid and has hiccups, and appears uncomfortable. His sister states he had a loss of appetite around 3 weeks ago and states he had similar symptoms in the past which improved upon being treated for H. Pylori.   He states he is waiting to hear whether he will be a candidate for the chemo treatment. Attempted to discuss code status, and he stated he wants everything done for him.   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 He is not married and has no children.     SUMMARY OF RECOMMENDATIONS    Will meet again following patient conversation with oncology.   Code Status/Advance Care Planning:  Full code    Symptom Management:   Agree with nausea, hiccup, and pain management.   Palliative Prophylaxis:   Frequent Pain Assessment   Prognosis:   Poor, but depends on oncology treatment.   Discharge Planning: To Be Determined      Primary Diagnoses: Present on Admission: . Chronic peptic ulcer of stomach . Malignant neoplasm of body of stomach ([place]) . Malignant ascites . Sepsis ([place]) . HCAP (healthcare-associated pneumonia)   I have reviewed the medical record, interviewed the patient and family,  and examined the patient. The following aspects are pertinent.  Past Medical History:  Diagnosis Date  . Gastric cancer ([place]) 08/2015   Folfox chemo tx's.  Marland Kitchen GERD (gastroesophageal reflux disease)    Social History   Social History  . Marital status: Single    Spouse name: N/A  . Number of children: N/A  . Years of education: N/A   Social History Main Topics  . Smoking status: Never Smoker  . Smokeless tobacco: Never Used  . Alcohol use No  . Drug use: No  . Sexual activity: Not Asked   Other Topics Concern  . None   Social History Narrative  . None   Family History  Problem Relation Age of Onset  . Family history unknown: Yes   Scheduled Meds: . calcium carbonate  1 tablet Oral TID WC  . calcium carbonate  1 tablet Oral Once  . dexamethasone  10 mg Intravenous Q12H  . enoxaparin (LOVENOX) injection  40 mg Subcutaneous Q24H  . feeding supplement (ENSURE ENLIVE)  237 mL Oral BID BM  . metoCLOPramide (REGLAN) injection  10 mg Intravenous TID AC & HS  .  nystatin  5 mL Oral QID  . OLANZapine  10 mg Oral QHS  . pantoprazole  40 mg Oral Daily   Continuous Infusions: . 0.9 % NaCl with KCl 20 mEq / L 60 mL/hr at 11/25/16 0705  . chlorproMAZINE (THORAZINE) IV    . erythromycin    . piperacillin-tazobactam (ZOSYN)  IV Stopped (11/25/16 0856)   PRN Meds:.acetaminophen **OR** acetaminophen, alum & mag hydroxide-simeth, chlorproMAZINE (THORAZINE) IV, HYDROmorphone (DILAUDID) injection, LORazepam, ondansetron **OR** ondansetron (ZOFRAN) IV, oxyCODONE, zolpidem Medications Prior to Admission:  Prior to Admission medications   Medication Sig Start Date End Date Taking? Authorizing Provider  HYDROmorphone (DILAUDID) 2 MG tablet Take 1 tablet (2 mg total) by mouth every 12 (twelve) hours as needed for severe pain. 11/17/16 11/17/17 Yes Earleen Newport, MD  promethazine (PHENERGAN) 25 MG tablet Take 1 tablet (25 mg total) by mouth every 4 (four) hours as needed for nausea or  vomiting. 11/17/16  Yes Earleen Newport, MD   Allergies  Allergen Reactions  . Iodine     Possible anaphylaxis per MD Per conversation with pt, pt denies allergy to IV contrast.  Pt injected 09/26/16 without premeds, pt with no reaction.   Review of Systems  Constitutional: Positive for appetite change and fatigue.  Gastrointestinal:       Spitting up bilious fluid.  Allergic/Immunologic: Positive for immunocompromised state.    Physical Exam  Constitutional:  Appears uncomfortable  Eyes: EOM are normal.  Pulmonary/Chest: Effort normal. No respiratory distress.  Neurological: He is alert.    Vital Signs: BP (!) 127/96 (BP Location: Right Arm)   Pulse (!) 116   Temp 98.5 F (36.9 C) (Oral)   Resp 18   Ht _0  (1.651 m)   Wt 74.1 kg (163 lb 6.4 oz)   SpO2 97%   BMI 27.19 kg/m  Pain Assessment: 0-10 POSS *See Group Information*: 1-Acceptable,Awake and alert Pain Score: Asleep   SpO2: SpO2: 97 % O2 Device:SpO2: 97 % O2 Flow Rate: .O2 Flow Rate (L/min): 3 L/min  IO: Intake/output summary:  Intake/Output Summary (Last 24 hours) at 11/25/16 1133 Last data filed at 11/25/16 0900  Gross per 24 hour  Intake            362.5 ml  Output              550 ml  Net           -187.5 ml    LBM: Last BM Date: 11/22/16 Baseline Weight: Weight: 68 kg (150 lb) Most recent weight: Weight: 74.1 kg (163 lb 6.4 oz)     Palliative Assessment/Data: 50%     Time In: 10:47 Time Out: 11:47 Time Total: 1 hour Greater than 50%  of this time was spent counseling and coordinating care related to the above assessment and plan.  Signed by: Asencion Gowda, NP 11/25/2016 11:48 AM Office: [phone] 7am-7pm  Pager: [phone] Call primary team after hours   Please contact Palliative Medicine Team phone at [phone] for questions and concerns.  For individual provider: See Shea Evans

## 2016-11-26 LAB — BASIC METABOLIC PANEL
Anion gap: 7 (ref 5–15)
BUN: 17 mg/dL (ref 6–20)
CALCIUM: 8.1 mg/dL — AB (ref 8.9–10.3)
CO2: 28 mmol/L (ref 22–32)
CREATININE: 0.57 mg/dL — AB (ref 0.61–1.24)
Chloride: 109 mmol/L (ref 101–111)
GFR calc Af Amer: >60 mL/min (ref >60)
GFR calc non Af Amer: >60 mL/min (ref >60)
GLUCOSE: 167 mg/dL — AB (ref 65–99)
Potassium: 4 mmol/L (ref 3.5–5.1)
Sodium: 144 mmol/L (ref 135–145)

## 2016-11-26 LAB — MAGNESIUM: Magnesium: 2.3 mg/dL (ref 1.7–2.4)

## 2016-11-26 MED ORDER — HALOPERIDOL LACTATE 5 MG/ML IJ SOLN
0.5000 mg | Freq: Four times a day (QID) | INTRAMUSCULAR | Status: DC
  Administered 2016-11-26 – 2016-12-02 (×13): 0.5 mg via INTRAVENOUS
  Filled 2016-11-26 (×14): qty 1

## 2016-11-26 MED ORDER — PROMETHAZINE HCL 25 MG/ML IJ SOLN
12.5000 mg | Freq: Four times a day (QID) | INTRAMUSCULAR | Status: DC | PRN
  Administered 2016-11-26 – 2016-12-03 (×5): 12.5 mg via INTRAVENOUS
  Filled 2016-11-26 (×6): qty 1

## 2016-11-26 MED ORDER — SCOPOLAMINE 1 MG/3DAYS TD PT72
1.0000 | MEDICATED_PATCH | TRANSDERMAL | Status: DC
  Administered 2016-11-26 – 2016-12-02 (×3): 1.5 mg via TRANSDERMAL
  Filled 2016-11-26 (×5): qty 1

## 2016-11-26 MED ORDER — LIDOCAINE VISCOUS 2 % MT SOLN: 15.0000 mL | Freq: Three times a day (TID) | OROMUCOSAL | Status: DC | PRN

## 2016-11-26 MED ORDER — LIDOCAINE HCL 2 % EX GEL
1.0000 "application " | Freq: Three times a day (TID) | CUTANEOUS | Status: DC | PRN
  Administered 2016-11-26: 1 via TOPICAL
  Filled 2016-11-26 (×2): qty 5

## 2016-11-26 MED ORDER — LIDOCAINE VISCOUS 2 % MT SOLN
15.0000 mL | Freq: Three times a day (TID) | OROMUCOSAL | Status: DC | PRN
Start: 2016-11-26 — End: 2016-12-05
  Administered 2016-11-26 – 2016-11-30 (×6): 15 mL via OROMUCOSAL
  Filled 2016-11-26 (×11): qty 15

## 2016-11-26 MED ORDER — LIDOCAINE HCL 2 % EX GEL
1.0000 "application " | Freq: Three times a day (TID) | CUTANEOUS | Status: DC | PRN
  Filled 2016-11-26: qty 5

## 2016-11-26 MED ORDER — LIDOCAINE HCL 4 % EX SOLN
Freq: Three times a day (TID) | CUTANEOUS | Status: DC | PRN
  Filled 2016-11-26: qty 50

## 2016-11-26 MED ORDER — DRONABINOL 2.5 MG PO CAPS
2.5000 mg | ORAL_CAPSULE | Freq: Two times a day (BID) | ORAL | Status: DC
  Administered 2016-11-26 – 2016-11-28 (×3): 2.5 mg via ORAL
  Filled 2016-11-26 (×5): qty 1

## 2016-11-26 NOTE — Progress Notes (Signed)
Daily Progress Note   Patient Name: Anthony Melendez       Date: 11/26/2016 DOB: 20-Mar-1980  Age: 36 y.o. MRN#: [id number] Attending Physician: Gladstone Lighter, MD Primary Care Physician: Patient, No Pcp Per Admit Date: 11/20/2016  Reason for Consultation/Follow-up: Establishing goals of care  Subjective: Anthony Melendez is resting in bed. His family states he is feeling better, but is still nauseated. He is hungry and thirsty an can hold his food down, but spits up the bilious fluid. Per Oncology note, patient is not a candidate for the clinical trial at [facility] and family is upset. They are asking questions about the chemo treatment he has received up to this point. They also believe God will heal him.   24 hour Plans: Spoke with Dr. Tressia Miners, and awaiting Oncology to speak with family before we can move forward with discussing hospice plans.    Length of Stay: 6  Current Medications: Scheduled Meds:  . calcium carbonate  1 tablet Oral TID WC  . calcium carbonate  1 tablet Oral Once  . dexamethasone  10 mg Intravenous Q12H  . dronabinol  2.5 mg Oral BID AC  . enoxaparin (LOVENOX) injection  40 mg Subcutaneous Q24H  . feeding supplement (ENSURE ENLIVE)  237 mL Oral BID BM  . metoCLOPramide (REGLAN) injection  10 mg Intravenous TID AC & HS  . nystatin  5 mL Oral QID  . OLANZapine  10 mg Oral QHS  . pantoprazole (PROTONIX) IV  40 mg Intravenous Q24H  . pneumococcal 23 valent vaccine  0.5 mL Intramuscular Tomorrow-1000    Continuous Infusions: . 0.9 % NaCl with KCl 20 mEq / L 60 mL/hr at 11/25/16 1750  . chlorproMAZINE (THORAZINE) IV Stopped (11/26/16 0553)  . piperacillin-tazobactam (ZOSYN)  IV 3.375 g (11/26/16 0523)    PRN Meds: acetaminophen **OR** acetaminophen, alum & mag  hydroxide-simeth, chlorproMAZINE (THORAZINE) IV, HYDROmorphone (DILAUDID) injection, lidocaine, LORazepam, magic mouthwash **AND** [DISCONTINUED] lidocaine, ondansetron **OR** ondansetron (ZOFRAN) IV, oxyCODONE, zolpidem  Physical Exam  Constitutional:  Resting in bed with eyes closed. Opens eyes occassionally, and to spit.   HENT:  Head: Normocephalic.  Eyes: EOM are normal.  Pulmonary/Chest: Effort normal.  Neurological: He is alert.            Vital Signs: BP 118/87 (BP Location: Right Arm)  Pulse (!) 105   Temp 97.7 F (36.5 C) (Oral)   Resp 17   Ht _0  (1.651 m)   Wt 74.1 kg (163 lb 6.4 oz)   SpO2 95%   BMI 27.19 kg/m  SpO2: SpO2: 95 % O2 Device: O2 Device: Not Delivered O2 Flow Rate: O2 Flow Rate (L/min): 3 L/min  Intake/output summary:  Intake/Output Summary (Last 24 hours) at 11/26/16 1044 Last data filed at 11/26/16 8938  Gross per 24 hour  Intake             1478 ml  Output              200 ml  Net             1278 ml   LBM: Last BM Date: 11/22/16 Baseline Weight: Weight: 68 kg (150 lb) Most recent weight: Weight: 74.1 kg (163 lb 6.4 oz)       Palliative Assessment/Data: 60%      Patient Active Problem List   Diagnosis Date Noted  . Loss of weight   . Bilious vomiting with nausea   . Intractable nausea and vomiting   . HCAP (healthcare-associated pneumonia) 11/21/2016  . Sepsis ([place]) 11/20/2016  . Malignant ascites 09/30/2016  . Encounter for antineoplastic immunotherapy 09/09/2016  . Cancer related pain 09/02/2016  . Chemotherapy-induced neutropenia ([place]) 07/21/2016  . Goals of care, counseling/discussion 05/29/2016  . Urticaria 04/22/2016  . 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 Chronic peptic ulcer of stomach   . Encounter for antineoplastic chemotherapy 01/15/2016  . 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 Chronic gastric ulcer 10/28/2015  . Hypocalcemia 09/30/2015  . Iron deficiency anemia 08/28/2015  . Microcytic  red blood cells 08/14/2015  . Hypokalemia 08/14/2015  . Malignant neoplasm of body of stomach ([place]) 08/07/2015  . Helicobacter pylori gastritis 08/02/2015  . Abnormal findings-gastrointestinal tract   . Neoplasm of digestive system   . 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 He sees Dr. Mike Gip as an outpatient. He has progressed through FOLFOX, cyramza taxol and most recently [place].   Assessment: Anthony Melendez is not a candidate for the trial at [facility]. His nausea has improved somewhat. He appears weak and frail.   Recommendations/Plan:  Initiated discussions regarding hospice, however family is not ready to discuss this as they would like to speak with oncology about his prior treatment and his inability to participate in this study.   Agree with medications in place for nausea. Haldol 0.42m added for nausea following discussion with Dr. FDomingo Cockingand Dr. KTressia Miners    Code Status:    Code Status Orders        Start     Ordered   11/20/16 2258  Full code  Continuous     11/20/16 2257    Code Status History    Date Active Date Inactive Code Status Order ID Comments User Context   This patient has a current code status but no historical code status.       Prognosis:   < 4 weeks Depending on oral intake.   Discharge Planning:  To Be Determined  Care plan was discussed with Dr. KWyvonne Lenz   Thank you for allowing the Palliative Medicine Team to assist in the care of this patient.   Time  In: 9:00 Time Out: 10:10 Total Time 1 hour 10 minutes Prolonged Time Billed  No      Greater than 50%  of this time was spent counseling and coordinating care related to the above assessment and plan.  Asencion Gowda, NP  Please contact Palliative Medicine  Team phone at [phone] for questions and concerns.

## 2016-11-26 NOTE — Progress Notes (Signed)
[facility] [place] NAME: Anthony Melendez    MR#:  [id number]  DATE OF BIRTH:  [birth year]  SUBJECTIVE:  CHIEF COMPLAINT:   Chief Complaint  Patient presents with  . Abdominal Pain   - Communicated through an interpreter. Mother at bedside. Patient continues to have ongoing nausea and vomiting and continues to spit up bilious fluid.  REVIEW OF SYSTEMS:  Review of Systems  Constitutional: Positive for malaise/fatigue and weight loss. Negative for chills and fever.  HENT: Negative for congestion, ear discharge, hearing loss and nosebleeds.   Eyes: Negative for blurred vision and double vision.  Respiratory: Negative for cough, shortness of breath and wheezing.   Cardiovascular: Negative for chest pain, palpitations and leg swelling.  Gastrointestinal: Positive for abdominal pain, nausea and vomiting. Negative for constipation and diarrhea.  Genitourinary: Negative for dysuria and urgency.  Musculoskeletal: Positive for back pain. Negative for myalgias.  Neurological: Negative for dizziness, seizures and headaches.  Psychiatric/Behavioral: Negative for depression.    DRUG ALLERGIES:   Allergies  Allergen Reactions  . Iodine     Possible anaphylaxis per MD Per conversation with pt, pt denies allergy to IV contrast.  Pt injected 09/26/16 without premeds, pt with no reaction.    VITALS:  Blood pressure 118/87, pulse (!) 105, temperature 97.7 F (36.5 C), temperature source Oral, resp. rate 17, height 5\' 5"  (1.651 m), weight 74.1 kg (163 lb 6.4 oz), SpO2 95 %.  PHYSICAL EXAMINATION:  Physical Exam  GENERAL:  36 y.o.-year-old ill-nourished patient lying in the bed, appears miserable due to constant nausea and vomiting. EYES: Pupils equal, round, reactive to light and accommodation. No scleral icterus. Extraocular muscles intact.  HEENT: Head atraumatic, normocephalic. Oropharynx and nasopharynx clear.  NECK:  Supple, no jugular  venous distention. No thyroid enlargement, no tenderness.  LUNGS: Normal breath sounds bilaterally, no wheezing, rales,rhonchi or crepitation. No use of accessory muscles of respiration. Decreased bibasilar breath sounds CARDIOVASCULAR: S1, S2 normal. No murmurs, rubs, or gallops.  ABDOMEN: Soft, nontender but discomfort on palpation in left and right upper quadrant, distended and tight/firm. Hypoactive Bowel sounds present. No organomegaly or mass.  EXTREMITIES: No pedal edema, cyanosis, or clubbing.  NEUROLOGIC: Cranial nerves II through XII are intact. Muscle strength 5/5 in all extremities. Sensation intact. Gait not checked.  PSYCHIATRIC: The patient is alert and oriented x 3.  SKIN: No obvious rash, lesion, or ulcer.    LABORATORY PANEL:   CBC  Recent Labs Lab 11/24/16 0351  WBC 15.2*  HGB 9.3*  HCT 29.0*  PLT 282   ------------------------------------------------------------------------------------------------------------------  Chemistries   Recent Labs Lab 11/24/16 0351 11/26/16 0616  NA 138 144  K 3.5 4.0  CL 109 109  CO2 24 28  GLUCOSE 207* 167*  BUN 8 17  CREATININE 0.63 0.57*  CALCIUM 8.6* 8.1*  MG  --  2.3  AST 22  --   ALT 12*  --   ALKPHOS 90  --   BILITOT 0.7  --    ------------------------------------------------------------------------------------------------------------------  Cardiac Enzymes No results for input(s): TROPONINI in the last 168 hours. ------------------------------------------------------------------------------------------------------------------  RADIOLOGY:  No results found.  EKG:   Orders placed or performed during the hospital encounter of 11/20/16  . EKG 12-Lead  . EKG 12-Lead  . EKG 12-Lead  . EKG 12-Lead    ASSESSMENT AND PLAN:   36 year old male with past medical history significant for metastatic gastric cancer status post large volume paracentesis presents to  hospital secondary to abdominal pain and  nausea.  #1 community-acquired pneumonia-left lower lobe infiltrate noted on chest x-ray. -Blood cultures are negative. Currently on zosyn. Stop after 7 days -Rocephin changed to Zosyn for intra-abdominal coverage  #2 GI tract dysmotility- causing abdominal pain and nausea-has had intermittent abdominal pain associated with nausea likely secondary to his underlying malignancy. -Prior large-volume paracentesis, however paracentesis attempted to 3 weeks ago at [facility] emergency room and no fluid could be removed. This admission only 600 cc removed -CT of the abdomen done, patient unable to drink the whole oral contrast. Did not show any obstruction, distended stomach noted though. -GI consult appreciated, s/p upper GI endoscopy on 11/24/16 - showing increased ulcerated gastric cancer in stomach, significant GI tract dysmotility - unfortunate situation- not a candidate for PEG-J tube.. -Trying multiple medications at this time for his nausea vomiting. However continues to have significant symptoms. -On Zyprexa, Haldol, Decadron, Marinol. Also on Reglan and Protonix. -Scopolamine patch added as well -Erythromycin is a Producer, television/film/video, not available in our pharmacy at this time  #3 weight loss/failure to thrive-and able to eat due to increased abdominal pain and nausea -Nausea medications adjusted. Please see EGD report, poor prognosis -Nutritional status is poor.   #4 hypokalemia and hypomagnesemia-replaced  #5 metastatic gastric carcinoma-diagnosed in June 2017, metastases to the omentum and also malignant ascites. - Appreciate oncology consult, progression of disease on chemotherapy, unable to participate in clinical trials at this time. -Oncology to set up a family meeting to discuss his prognosis. -Appreciate palliative care consult  #6 DVT prophylaxis- lovenox  Note:  Communicated with an interpreter in the room. Mother was at bedside and had several questions. -Discussed that patient  was progressing in spite of chemotherapy here so that [place] was suspended and he was supposed to enroll in the clinical trial. -However due to being ill, and now with intractable nausea vomiting-he is excluded from the clinical trial. [facility] did not have any further trials to offer for the patient. -Discussing with his primary oncologist, she expressed that patient probably can get symptomatic treatment, and he is not in any condition to try any other chemotherapy/immunotherapy. She recommended hospice. -Discussed hospice with patient and mother. Initially they are upset about not being given chemotherapy, however after explaining the rationale behind holding chemotherapy and not treating at this time they have expressed their understanding. However want to continue to fight, they believe that a miracle is possible.   All the records are reviewed and case discussed with Care Management/Social Workerr. Management plans discussed with the patient, family and they are in agreement.  CODE STATUS: Full code  TOTAL TIME TAKING CARE OF THIS PATIENT: 42  minutes.   POSSIBLE D/C ? days, DEPENDING ON CLINICAL CONDITION.   Risha Barretta M.D on 11/26/2016 at 1:48 PM  Between 7am to 6pm - Pager - [phone]  After 6pm go to [email or web address] - password [email or web address] [facility]  Office  [phone]  CC: Primary care physician; Patient, No Pcp Per

## 2016-11-27 DIAGNOSIS — Z7901 Long term (current) use of anticoagulants: Secondary | ICD-10-CM

## 2016-11-27 MED ORDER — SODIUM CHLORIDE 0.9% FLUSH: 10.0000 mL | INTRAVENOUS | Status: DC | PRN

## 2016-11-27 MED ORDER — SODIUM CHLORIDE 0.9% FLUSH
10.0000 mL | Freq: Two times a day (BID) | INTRAVENOUS | Status: DC
  Administered 2016-11-27: 22:00:00 30 mL
  Administered 2016-11-28 (×2): 10 mL

## 2016-11-27 NOTE — Progress Notes (Signed)
[facility] Hematology/Oncology Progress Note  Date of admission: 11/20/2016  Hospital day:  11/27/2016  Chief Complaint: Anthony Melendez is a 36 y.o. male with metastatic gastric cancer who was admitted with abdominal pain, anorexia, and weight loss.  Subjective:  Patient has ogoing epigastric pain, nausea and vomiting.  Social History: The patient is accompanied by his mother, sister, the interpreter, and Dr. Tressia Miners today.  Allergies:  Allergies  Allergen Reactions  . Iodine     Possible anaphylaxis per MD Per conversation with pt, pt denies allergy to IV contrast.  Pt injected 09/26/16 without premeds, pt with no reaction.    Scheduled Medications: . calcium carbonate  1 tablet Oral TID WC  . calcium carbonate  1 tablet Oral Once  . dexamethasone  10 mg Intravenous Q12H  . dronabinol  2.5 mg Oral BID AC  . enoxaparin (LOVENOX) injection  40 mg Subcutaneous Q24H  . feeding supplement (ENSURE ENLIVE)  237 mL Oral BID BM  . haloperidol lactate  0.5 mg Intravenous Q6H  . metoCLOPramide (REGLAN) injection  10 mg Intravenous TID AC & HS  . nystatin  5 mL Oral QID  . OLANZapine  10 mg Oral QHS  . pantoprazole (PROTONIX) IV  40 mg Intravenous Q24H  . scopolamine  1 patch Transdermal Q72H  . sodium chloride flush  10-40 mL Intracatheter Q12H    Review of Systems: GENERAL:  Fatigue.  No fevers or sweats.  Ongoing weight loss. PERFORMANCE STATUS (ECOG):  3 HEENT:  No visual changes, runny nose, sore throat, mouth sores or tenderness. Lungs: No shortness of breath or cough.  No hemoptysis. Cardiac:  No chest pain, palpitations, orthopnea, or PND. GI:  Nausea and vomiting.  Minimal oral intake for 2-3 weeks.  Epigastric pain.  No diarrhea, constipation, melena or hematochezia. GU:  No urgency, frequency, dysuria, or hematuria. Musculoskeletal:  No back pain.  No joint pain.  No muscle tenderness. Extremities:  No pain or swelling. Skin:  No rashes or skin  changes. Neuro:  No headache, numbness or weakness, balance or coordination issues. Endocrine:  No diabetes, thyroid issues, hot flashes or night sweats. Psych:  No mood changes, depression or anxiety. Pain:  Epigastric pain. Review of systems:  All other systems reviewed and found to be negative.  Physical Exam: Blood pressure 117/88, pulse (!) 103, temperature 97.6 F (36.4 C), temperature source Oral, resp. rate 20, height '5\' 5"'$  (1.651 m), weight 163 lb 6.4 oz (74.1 kg), SpO2 96 %.  GENERAL:  Chronically ill appearing gentleman sitting up on the edge of his bed intermittently vomiting. HEAD:  Thin dark hair.  Normocephalic, atraumatic, face symmetric, no Cushingoid features. EYES:  Brown eyes.  Pupils equal round.  No conjunctivitis or scleral icterus. ENT:  No oral lesions.  No thrush. RESPIRATORY:  Clear to auscultation anteriorly without rales, wheezes or rhonchi. CARDIOVASCULAR:  Tachycardic.  Regular rate and rhythm without murmur, rub or gallop. ABDOMEN:  Soft, slightly tender to deep palpation in the epigastric region.  Firm epigastric area.  Active bowel sounds and no hepatosplenomegaly.   SKIN:  No rashes, ulcers or lesions. EXTREMITIES: No edema, no skin discoloration or tenderness.  No palpable cords. NEUROLOGICAL: Appropriate.   Results for orders placed or performed during the hospital encounter of 11/20/16 (from the past 48 hour(s))  Basic metabolic panel     Status: Abnormal   Collection Time: 11/26/16  6:16 AM  Result Value Ref Range   Sodium 144 135 - 145  mmol/L   Potassium 4.0 3.5 - 5.1 mmol/L   Chloride 109 101 - 111 mmol/L   CO2 28 22 - 32 mmol/L   Glucose, Bld 167 (H) 65 - 99 mg/dL   BUN 17 6 - 20 mg/dL   Creatinine, Ser 0.57 (L) 0.61 - 1.24 mg/dL   Calcium 8.1 (L) 8.9 - 10.3 mg/dL   GFR calc non Af Amer >60 >60 mL/min   GFR calc Af Amer >60 >60 mL/min    Comment: (NOTE) The eGFR has been calculated using the CKD EPI equation. This calculation has not  been validated in all clinical situations. eGFR's persistently <60 mL/min signify possible Chronic Kidney Disease.    Anion gap 7 5 - 15  Magnesium     Status: None   Collection Time: 11/26/16  6:16 AM  Result Value Ref Range   Magnesium 2.3 1.7 - 2.4 mg/dL   No results found.  Assessment:  Anthony Melendez is a 36 y.o. male with stage IV gastric cancer who was admitted with chronic nausea, vomiting, decreased oral intake, and weight loss.  He initially presented in 07/2015.  Disease has progressed following FOLFOX (08/14/2015 - 02/05/2016), ramucirumab + Taxol (05/01/2016 - 08/20/2016), and pembrolizumab (09/09/2016 - 09/30/2016).  Until recently, he has required large volume paracentesis secondary to malignant ascites.  He has been seen at [facility] for clinical trial enrollment ([id number]).  Treatment postponed from 10/15 to 11/25/2016 secondary to ER visit at [facility] with abdominal pain, nausea and vomiting.   EGD on 11/24/2016 revealed one non-obstructing oozing cratered gastric ulcer of significant severity along the greater curvature of the stomach. There was no evidence of perforation. Findings were consistent with gastric carcinoma, size is increased compared to EGD from 02/2016.  There was a large amount of bilious fluid in the gastric fundus, gastric body, middle and lower third of the esophagus.  Findings were highly concerning for significant dysmotility of the GI tract explaining patient's symptoms of intractable hiccups, nausea and vomiting.  Plan: 1.  Oncology:  Metastatic gastric cancer with peritoneal carcinomatosis.  Spoke with Dr. Donalynn Furlong.  Last day for initiation of treatment on trial was 11/26/2016.  He is off study.  There are no other clinical trials.  He is a candidate for palliative care/Hospice given his performance status.  If he would improve, could try an irinotecan based regimen (FOLFIRI).  2.  Gastroenterology:  EGD reveals an enlarging non-obstructive gastric  ulcer.  Patient has significant dysmotility.  Trial of Reglan has been unsuccessful. He has had no significant nausea relief with ondansetron and compazine.  Discussed possible erythromycin (shortage per Dr. Tressia Miners). Patient on Decadron 10 mg BID, Marinol, Ativan, Zyprexa, and scopolamine patch.  Patient on thorazine for singultus.  Patient is not a candidate for drainage gastrostomy tube secondary to ascites and gastric malignancy.  Discussed with Dr Marius Ditch.  There was no obstruction.  She easily went into the 2nd part of the duodenum.  A stent would not be helpful.  He could have a soft Dubhoff placed for feedings.  An NG could be placed, but some risk for perforation.  Agree that TPN is not a viable option (unless used as a bridge during treatment) given his advancing malignancy.  3.  Disposition:  Multiple issues were discussed with the patient and his family today with the assistance of the interpreter.  Will contact [facility] tomorrow for any options.  Will need to discuss code status as patient is a Hospice candidate.  Agree with  palliative care consult.    Lequita Asal, MD  11/27/2016, 10:17 PM

## 2016-11-27 NOTE — Progress Notes (Signed)
Nutrition Follow-up  DOCUMENTATION CODES:   Not applicable  INTERVENTION:  If family wants aggressive care, recommend TPN in setting of inability to tolerate PO intake  Monitor GOC  Continue Ensure Enlive po BID, each supplement provides 350 kcal and 20 grams of protein  NUTRITION DIAGNOSIS:   Increased nutrient needs related to chronic illness as evidenced by estimated needs. -ongoing  GOAL:   Patient will meet greater than or equal to 90% of their needs -not meeting  MONITOR:   PO intake, I & O's, Labs, Weight trends  REASON FOR ASSESSMENT:   Malnutrition Screening Tool    ASSESSMENT:   Anthony Melendez is a 36 yo male with PMH of STG IV gastric cancer with malignant ascites and peritoneal carcinomatosis, transitioned from standard chemo to trial chemo at [facility]. Presents with abd pain, nausea,vomiting, weight loss, HCAP. Underwent paracentesis today, 653mL fluid pulled.  Family does not want palliative care. Believing for a miracle per MD note. Continues with nausea and vomiting despite medication changes. Given patient's inability to tolerate PO recommend TPN. There are no other nutrition options at this time.  Patient was not a candidate for clinical trial at [facility] unfortunately.  UOP 589mL yesterday  Labs reviewed  Medications reviewed and include:  Marinol, Decadron, Reglan, Tums NS 20K+ at 53mL/hr  Diet Order:  DIET SOFT Room service appropriate? Yes; Fluid consistency: Thin  Skin:  Reviewed, no issues  Last BM:  11/22/2016  Height:   Ht Readings from Last 1 Encounters:  11/20/16 5\' 5"  (1.651 m)    Weight:   Wt Readings from Last 1 Encounters:  11/21/16 163 lb 6.4 oz (74.1 kg)    Ideal Body Weight:  61.81 kg  BMI:  Body mass index is 27.19 kg/m.  Estimated Nutritional Needs:   Kcal:  2222-2600 calories (30-35 cal/kg)  Protein:  96-126 grams (1.3-1.7g/kg)  Fluid:  2.2-2.6L  EDUCATION NEEDS:   No education needs identified at this  time  Anthony Anis. Jalesia Loudenslager, MS, RD LDN Inpatient Clinical Dietitian Pager [phone]

## 2016-11-27 NOTE — Progress Notes (Signed)
[place] at [place] NAME: Anthony Melendez    MR#:  [id number]  DATE OF BIRTH:  Sep 22, 1980  SUBJECTIVE:  CHIEF COMPLAINT:   Chief Complaint  Patient presents with  . Abdominal Pain   - Communicated through an interpreter. Mother at bedside. Patient continues to have ongoing nausea and vomiting and continues to spit up bilious fluid. -Feels no relief  REVIEW OF SYSTEMS:  Review of Systems  Constitutional: Positive for malaise/fatigue and weight loss. Negative for chills and fever.  HENT: Negative for congestion, ear discharge, hearing loss and nosebleeds.   Eyes: Negative for blurred vision and double vision.  Respiratory: Negative for cough, shortness of breath and wheezing.   Cardiovascular: Negative for chest pain, palpitations and leg swelling.  Gastrointestinal: Positive for abdominal pain, nausea and vomiting. Negative for constipation and diarrhea.  Genitourinary: Negative for dysuria and urgency.  Musculoskeletal: Positive for back pain. Negative for myalgias.  Neurological: Negative for dizziness, seizures and headaches.  Psychiatric/Behavioral: Negative for depression.    DRUG ALLERGIES:   Allergies  Allergen Reactions  . Iodine     Possible anaphylaxis per MD Per conversation with pt, pt denies allergy to IV contrast.  Pt injected 09/26/16 without premeds, pt with no reaction.    VITALS:  Blood pressure 99/67, pulse 99, temperature 98 F (36.7 C), temperature source Oral, resp. rate 20, height 5\' 5"  (1.651 m), weight 74.1 kg (163 lb 6.4 oz), SpO2 99 %.  PHYSICAL EXAMINATION:  Physical Exam  GENERAL:  36 y.o.-year-old ill-nourished patient lying in the bed, appears miserable due to constant nausea and vomiting. EYES: Pupils equal, round, reactive to light and accommodation. No scleral icterus. Extraocular muscles intact.  HEENT: Head atraumatic, normocephalic. Oropharynx and nasopharynx clear.  NECK:  Supple, no  jugular venous distention. No thyroid enlargement, no tenderness.  LUNGS: Normal breath sounds bilaterally, no wheezing, rales,rhonchi or crepitation. No use of accessory muscles of respiration. Decreased bibasilar breath sounds CARDIOVASCULAR: S1, S2 normal. No murmurs, rubs, or gallops.  ABDOMEN: Soft, nontender but discomfort on palpation in left and right upper quadrant, distended and tight/firm. Hypoactive Bowel sounds present. No organomegaly or mass.  EXTREMITIES: No pedal edema, cyanosis, or clubbing.  NEUROLOGIC: Cranial nerves II through XII are intact. Muscle strength 5/5 in all extremities. Sensation intact. Gait not checked.  PSYCHIATRIC: The patient is alert and oriented x 3.  SKIN: No obvious rash, lesion, or ulcer.    LABORATORY PANEL:   CBC  Recent Labs Lab 11/24/16 0351  WBC 15.2*  HGB 9.3*  HCT 29.0*  PLT 282   ------------------------------------------------------------------------------------------------------------------  Chemistries   Recent Labs Lab 11/24/16 0351 11/26/16 0616  NA 138 144  K 3.5 4.0  CL 109 109  CO2 24 28  GLUCOSE 207* 167*  BUN 8 17  CREATININE 0.63 0.57*  CALCIUM 8.6* 8.1*  MG  --  2.3  AST 22  --   ALT 12*  --   ALKPHOS 90  --   BILITOT 0.7  --    ------------------------------------------------------------------------------------------------------------------  Cardiac Enzymes No results for input(s): TROPONINI in the last 168 hours. ------------------------------------------------------------------------------------------------------------------  RADIOLOGY:  No results found.  EKG:   Orders placed or performed during the hospital encounter of 11/20/16  . EKG 12-Lead  . EKG 12-Lead  . EKG 12-Lead  . EKG 12-Lead    ASSESSMENT AND PLAN:   36 year old male with past medical history significant for metastatic gastric cancer status post large volume paracentesis  presents to hospital secondary to abdominal pain and  nausea.  #1 community-acquired pneumonia-left lower lobe infiltrate noted on chest x-ray. -Blood cultures are negative. Currently on zosyn. Stop after 7 days -Rocephin changed to Zosyn for intra-abdominal coverage  #2 GI tract dysmotility- causing abdominal pain and nausea-has had intermittent abdominal pain associated with nausea likely secondary to his underlying malignancy. -Prior large-volume paracentesis, however paracentesis attempted to 3 weeks ago at [facility] emergency room and no fluid could be removed. This admission only 600 cc removed -CT of the abdomen done, patient unable to drink the whole oral contrast. Did not show any obstruction, distended stomach noted though. -GI consult appreciated, s/p upper GI endoscopy on 11/24/16 - showing increased ulcerated gastric cancer in stomach, significant GI tract dysmotility - unfortunate situation- not a candidate for PEG-J tube.. -Trying multiple medications at this time for his nausea vomiting. However continues to have significant symptoms. -On Zyprexa, Haldol, Decadron, Marinol. Also on Reglan and Protonix. -Scopolamine patch added as well -Erythromycin is a Producer, television/film/video, not available in our pharmacy at this time - Oncologist to discuss with GI about NG tube for suctioning to provide temporary relief. Also to see if he needs any stent placement to relieve obstruction  #3 weight loss/failure to thrive-and able to eat due to increased abdominal pain and nausea -Nausea medications adjusted. Please see EGD report, poor prognosis -Nutritional status is poor.   #4 hypokalemia and hypomagnesemia-replaced  #5 metastatic gastric carcinoma-diagnosed in June 2017, metastases to the omentum and also malignant ascites. - Appreciate oncology consult, progression of disease on chemotherapy, unable to participate in clinical trials at this time. -Oncology to set up a family meeting to discuss his prognosis. -Appreciate palliative care consult  #6  DVT prophylaxis- lovenox  Note:  Communicated with an interpreter in the room. Mother was at bedside and had several questions. Also Dr. Mike Gip was present.  - explained Poor prognosis to mom with the help of an interpreter and also patient and sister. They're hoping for a miracle at this time. Refused hospice services.    All the records are reviewed and case discussed with Care Management/Social Workerr. Management plans discussed with the patient, family and they are in agreement.  CODE STATUS: Full code  TOTAL TIME TAKING CARE OF THIS PATIENT: 42  minutes.   POSSIBLE D/C ? days, DEPENDING ON CLINICAL CONDITION.   Neng Albee M.D on 11/27/2016 at 2:34 PM  Between 7am to 6pm - Pager - [phone]  After 6pm go to [email or web address] - password [email or web address] [facility]  Office  [phone]  CC: Primary care physician; Patient, No Pcp Per

## 2016-11-27 NOTE — Plan of Care (Signed)
Spoke with Dr. Tressia Miners, she states patient and family believes a miracle will happen and wants to be a full code with full care, and that there is no need for palliative to continue to follow. I told her I would cancel consult and to reconsult if needed.

## 2016-11-28 ENCOUNTER — Inpatient Hospital Stay: Payer: 59 | Admitting: Hematology and Oncology

## 2016-11-28 LAB — CBC
HCT: 31.5 % — ABNORMAL LOW (ref 40.0–52.0)
Hemoglobin: 10.2 g/dL — ABNORMAL LOW (ref 13.0–18.0)
MCH: 24.7 pg — ABNORMAL LOW (ref 26.0–34.0)
MCHC: 32.4 g/dL (ref 32.0–36.0)
MCV: 76.3 fL — ABNORMAL LOW (ref 80.0–100.0)
PLATELETS: 222 10*3/uL (ref 150–440)
RBC: 4.13 MIL/uL — ABNORMAL LOW (ref 4.40–5.90)
RDW: 15.8 % — AB (ref 11.5–14.5)
WBC: 9.8 10*3/uL (ref 3.8–10.6)

## 2016-11-28 LAB — BASIC METABOLIC PANEL
ANION GAP: 5 (ref 5–15)
BUN: 22 mg/dL — AB (ref 6–20)
CALCIUM: 8.1 mg/dL — AB (ref 8.9–10.3)
CO2: 28 mmol/L (ref 22–32)
Chloride: 107 mmol/L (ref 101–111)
Creatinine, Ser: 0.64 mg/dL (ref 0.61–1.24)
GFR calc Af Amer: >60 mL/min (ref >60)
GFR calc non Af Amer: >60 mL/min (ref >60)
GLUCOSE: 154 mg/dL — AB (ref 65–99)
POTASSIUM: 3.9 mmol/L (ref 3.5–5.1)
Sodium: 140 mmol/L (ref 135–145)

## 2016-11-28 NOTE — Plan of Care (Signed)
Problem: Activity: Goal: Risk for activity intolerance will decrease Outcome: Progressing Sits on side of bed at times  Problem: Fluid Volume: Goal: Ability to maintain a balanced intake and output will improve Outcome: Progressing ivfs infusing. Pt cont to heave and  Vomit dark green bile  Color vomitus.antiemetics given.

## 2016-11-28 NOTE — Progress Notes (Signed)
[place] at [place] NAME: Desmin Daleo    MR#:  [id number]  DATE OF BIRTH:  08-26-1980  SUBJECTIVE:intractable nausea.vomiting non stop.sister and father are at bedside.used spanish interpreter.  CHIEF COMPLAINT:   Chief Complaint  Patient presents with  . Abdominal Pain   -  Patient continues to have ongoing nausea and vomiting and continues to spit up bilious fluid.  REVIEW OF SYSTEMS:  Review of Systems  Constitutional: Positive for malaise/fatigue and weight loss. Negative for chills and fever.  HENT: Negative for congestion, ear discharge, hearing loss and nosebleeds.   Eyes: Negative for blurred vision and double vision.  Respiratory: Negative for cough, shortness of breath and wheezing.   Cardiovascular: Negative for chest pain, palpitations and leg swelling.  Gastrointestinal: Positive for abdominal pain, nausea and vomiting. Negative for constipation and diarrhea.  Genitourinary: Negative for dysuria and urgency.  Musculoskeletal: Positive for back pain. Negative for myalgias.  Neurological: Negative for dizziness, seizures and headaches.  Psychiatric/Behavioral: Negative for depression.    DRUG ALLERGIES:   Allergies  Allergen Reactions  . Iodine     Possible anaphylaxis per MD Per conversation with pt, pt denies allergy to IV contrast.  Pt injected 09/26/16 without premeds, pt with no reaction.    VITALS:  Blood pressure 90/67, pulse 92, temperature 98.1 F (36.7 C), temperature source Oral, resp. rate 20, height 5\' 5"  (1.651 m), weight 74.1 kg (163 lb 6.4 oz), SpO2 98 %.  PHYSICAL EXAMINATION:  Physical Exam  GENERAL:  36 y.o.-year-old ill-nourished patient lying in the bed, appears miserable due to constant nausea and vomiting. EYES: Pupils equal, round, reactive to light and accommodation. No scleral icterus. Extraocular muscles intact.  HEENT: Head atraumatic, normocephalic. Oropharynx and nasopharynx  clear.  NECK:  Supple, no jugular venous distention. No thyroid enlargement, no tenderness.  LUNGS: Normal breath sounds bilaterally, no wheezing, rales,rhonchi or crepitation. No use of accessory muscles of respiration. Decreased bibasilar breath sounds CARDIOVASCULAR: S1, S2 normal. No murmurs, rubs, or gallops.  ABDOMEN: Soft, nontender but discomfort on palpation in left and right upper quadrant, distended and tight/firm. Hypoactive Bowel sounds present. No organomegaly or mass.  EXTREMITIES: No pedal edema, cyanosis, or clubbing.  NEUROLOGIC: Cranial nerves II through XII are intact. Muscle strength 5/5 in all extremities. Sensation intact. Gait not checked.  PSYCHIATRIC: The patient is alert and oriented x 3.  SKIN: No obvious rash, lesion, or ulcer.    LABORATORY PANEL:   CBC  Recent Labs Lab 11/28/16 0545  WBC 9.8  HGB 10.2*  HCT 31.5*  PLT 222   ------------------------------------------------------------------------------------------------------------------  Chemistries   Recent Labs Lab 11/24/16 0351 11/26/16 0616 11/28/16 0545  NA 138 144 140  K 3.5 4.0 3.9  CL 109 109 107  CO2 24 28 28   GLUCOSE 207* 167* 154*  BUN 8 17 22*  CREATININE 0.63 0.57* 0.64  CALCIUM 8.6* 8.1* 8.1*  MG  --  2.3  --   AST 22  --   --   ALT 12*  --   --   ALKPHOS 90  --   --   BILITOT 0.7  --   --    ------------------------------------------------------------------------------------------------------------------  Cardiac Enzymes No results for input(s): TROPONINI in the last 168 hours. ------------------------------------------------------------------------------------------------------------------  RADIOLOGY:  No results found.  EKG:   Orders placed or performed during the hospital encounter of 11/20/16  . EKG 12-Lead  . EKG 12-Lead  . EKG 12-Lead  .  EKG 12-Lead    ASSESSMENT AND PLAN:   36 year old male with past medical history significant for metastatic  gastric cancer status post large volume paracentesis presents to hospital secondary to abdominal pain and nausea.  #1 community-acquired pneumonia-left lower lobe infiltrate noted on chest x-ray. -Blood cultures are negative. Currently on zosyn. Stop after 7 days -Rocephin changed to Zosyn for intra-abdominal coverage  #2 GI tract dysmotility- causing abdominal pain and nausea-has had intermittent abdominal pain associated with nausea likely secondary to his underlying malignancy. -Prior large-volume paracentesis, however paracentesis attempted to 3 weeks ago at [facility] emergency room and no fluid could be removed. This admission only 600 cc removed -CT of the abdomen done, patient unable to drink the whole oral contrast. Did not show any obstruction, distended stomach noted though. -GI consult appreciated, s/p upper GI endoscopy on 11/24/16 - showing increased ulcerated gastric cancer in stomach, significant GI tract dysmotility - unfortunate situation- not a candidate for PEG-J tube.. -Trying multiple medications at this time for his nausea vomiting. However continues to have significant symptoms. -On Zyprexa, Haldol, Decadron, Marinol. Also on Reglan and Protonix.,zofran.,ativan, -Scopolamine patch added as well -Erythromycin is a Producer, television/film/video, not available in our pharmacy at this time  #3 weight loss/failure to thrive-and able to eat due to increased abdominal pain and nausea -Nausea medications adjusted. Please see EGD report, poor prognosis -Nutritional status is poor.   #4 hypokalemia and hypomagnesemia-replaced  #5 metastatic gastric carcinoma-diagnosed in June 2017, metastases to the omentum and also malignant ascites. - Appreciate oncology consult, progression of disease on chemotherapy, unable to participate in clinical trials at this time. -Oncology to set up a family meeting to discuss his prognosis.,father wants to talk to [employer] today. -Appreciate palliative care  consult  #6 DVT prophylaxis- lovenox  Note:  Communicated with an interpreter in the room. Father had lot questions about TPN /NG  Tube/further treatment for cancer -Discussed that patient was progressing in spite of chemotherapy here so that [facility] was suspended and he was supposed to enroll in the clinical trial. -However due to being ill, and now with intractable nausea vomiting-he is excluded from the clinical trial. [facility] did not have any further trials to offer for the patient. -Discussing with his primary oncologist, she expressed that patient probably can get symptomatic treatment, and he is not in any condition to try any other chemotherapy/immunotherapy. She recommended hospice. -Discussed hospice with patient and Father.Initially they are upset about not being given chemotherapy, however after explaining the rationale behind holding chemotherapy and not treating at this time they have expressed their understanding. However want to continue to fight, they believe that a miracle is possible.   All the records are reviewed and case discussed with Care Management/Social Workerr. Management plans discussed with the patient, family and they are in agreement.  CODE STATUS: Full code  TOTAL TIME TAKING CARE OF THIS PATIENT: 42  minutes.   POSSIBLE D/C ? days, DEPENDING ON CLINICAL CONDITION.   Epifanio Lesches M.D on 11/28/2016 at 8:42 AM  Between 7am to 6pm - Pager - [phone]  After 6pm go to [email or web address] - password [email or web address] [facility]  Office  [phone]  CC: Primary care physician; Patient, No Pcp Per

## 2016-11-28 NOTE — Progress Notes (Signed)
Patients dad is requesting patient be transferred to [facility] for care.  Notified Dr Jannifer Franklin, will discuss with morning Dr to put in place.

## 2016-11-29 ENCOUNTER — Encounter: Payer: Self-pay | Admitting: Internal Medicine

## 2016-11-29 NOTE — Plan of Care (Signed)
Problem: Health Behavior/Discharge Planning: Goal: Ability to manage health-related needs will improve Outcome: Not Progressing Dr Mike Gip and interpreter met with pt's family regarding pt's prognosis and discharge planning; family to discuss and advise Dr Mike Gip on 11/30/16 their resolution

## 2016-11-29 NOTE — Progress Notes (Signed)
[facility] Progress Note  Date of admission: 11/20/2016  Hospital day:  11/28/2016  Chief Complaint: Anthony Melendez is a 36 y.o. male with metastatic gastric cancer who was admitted with abdominal pain, anorexia, and weight loss.  Subjective:  Patient has ogoing epigastric pain, nausea and vomiting.  Social History: The patient is accompanied by his father, sister, and the interpreter today.  Allergies:  Allergies  Allergen Reactions  . Iodine     Possible anaphylaxis per MD Per conversation with pt, pt denies allergy to IV contrast.  Pt injected 09/26/16 without premeds, pt with no reaction.    Scheduled Medications: . calcium carbonate  1 tablet Oral TID WC  . calcium carbonate  1 tablet Oral Once  . dexamethasone  10 mg Intravenous Q12H  . dronabinol  2.5 mg Oral BID AC  . enoxaparin (LOVENOX) injection  40 mg Subcutaneous Q24H  . feeding supplement (ENSURE ENLIVE)  237 mL Oral BID BM  . haloperidol lactate  0.5 mg Intravenous Q6H  . metoCLOPramide (REGLAN) injection  10 mg Intravenous TID AC & HS  . nystatin  5 mL Oral QID  . OLANZapine  10 mg Oral QHS  . pantoprazole (PROTONIX) IV  40 mg Intravenous Q24H  . scopolamine  1 patch Transdermal Q72H  . sodium chloride flush  10-40 mL Intracatheter Q12H    Review of Systems: GENERAL:  Fatigue.  No fevers or sweats.  Ongoing weight loss. PERFORMANCE STATUS (ECOG):  3 HEENT:  No visual changes, runny nose, sore throat, mouth sores or tenderness. Lungs: No shortness of breath or cough.  No hemoptysis. Cardiac:  No chest pain, palpitations, orthopnea, or PND. GI:  Nausea and vomiting.  Minimal oral intake for 2-3 weeks.  Epigastric pain.  No diarrhea, constipation, melena or hematochezia. GU:  No urgency, frequency, dysuria, or hematuria. Musculoskeletal:  Back pain.  No joint pain.  No muscle tenderness. Extremities:  No pain or swelling. Skin:  No rashes or skin changes. Neuro:   No headache, numbness or weakness, balance or coordination issues. Endocrine:  No diabetes, thyroid issues, hot flashes or night sweats. Psych:  No mood changes, depression or anxiety. Pain:  Epigastric pain. Review of systems:  All other systems reviewed and found to be negative.  Physical Exam: Blood pressure (!) 101/55, pulse 68, temperature 98.2 F (36.8 C), temperature source Axillary, resp. rate 16, height '5\' 5"'$  (1.651 m), weight 163 lb 6.4 oz (74.1 kg), SpO2 97 %.  GENERAL:  Chronically ill fatigued appearing gentleman sitting up on the edge of his bed intermittently vomiting. HEAD:  Thin dark hair.  Temporal wasting.  Normocephalic, atraumatic, face symmetric, no Cushingoid features. EYES:  Brown eyes.  Pupils equal round.  No conjunctivitis or scleral icterus. EXTREMITIES: No edema, no skin discoloration or tenderness. NEUROLOGICAL:  Appropriate. Interactive with family member.   Results for orders placed or performed during the hospital encounter of 11/20/16 (from the past 48 hour(s))  CBC     Status: Abnormal   Collection Time: 11/28/16  5:45 AM  Result Value Ref Range   WBC 9.8 3.8 - 10.6 K/uL   RBC 4.13 (L) 4.40 - 5.90 MIL/uL   Hemoglobin 10.2 (L) 13.0 - 18.0 g/dL   HCT 31.5 (L) 40.0 - 52.0 %   MCV 76.3 (L) 80.0 - 100.0 fL   MCH 24.7 (L) 26.0 - 34.0 pg   MCHC 32.4 32.0 - 36.0 g/dL   RDW 15.8 (H) 11.5 - 14.5 %  Platelets 222 150 - 440 K/uL  Basic metabolic panel     Status: Abnormal   Collection Time: 11/28/16  5:45 AM  Result Value Ref Range   Sodium 140 135 - 145 mmol/L   Potassium 3.9 3.5 - 5.1 mmol/L   Chloride 107 101 - 111 mmol/L   CO2 28 22 - 32 mmol/L   Glucose, Bld 154 (H) 65 - 99 mg/dL   BUN 22 (H) 6 - 20 mg/dL   Creatinine, Ser 0.64 0.61 - 1.24 mg/dL   Calcium 8.1 (L) 8.9 - 10.3 mg/dL   GFR calc non Af Amer >60 >60 mL/min   GFR calc Af Amer >60 >60 mL/min    Comment: (NOTE) The eGFR has been calculated using the CKD EPI equation. This calculation  has not been validated in all clinical situations. eGFR's persistently <60 mL/min signify possible Chronic Kidney Disease.    Anion gap 5 5 - 15   No results found.  Assessment:  Anthony Melendez is a 36 y.o. male with stage IV gastric cancer who was admitted with chronic nausea, vomiting, decreased oral intake, and weight loss.  He initially presented in 07/2015.  Disease has progressed following FOLFOX (08/14/2015 - 02/05/2016), ramucirumab + Taxol (05/01/2016 - 08/20/2016), and pembrolizumab (09/09/2016 - 09/30/2016).  Until recently, he has required large volume paracentesis secondary to malignant ascites.  He has been seen at [facility] for clinical trial enrollment ([id number]).  Treatment postponed from 10/15 to 11/25/2016 secondary to ER visit at [facility] with abdominal pain, nausea and vomiting.   EGD on 11/24/2016 revealed one non-obstructing oozing cratered gastric ulcer of significant severity along the greater curvature of the stomach. There was no evidence of perforation. Findings were consistent with gastric carcinoma, size is increased compared to EGD from 02/2016.  There was a large amount of bilious fluid in the gastric fundus, gastric body, middle and lower third of the esophagus.  Findings were highly concerning for significant dysmotility of the GI tract explaining patient's symptoms of intractable hiccups, nausea and vomiting.  Plan: 1.  Oncology:  Metastatic gastric cancer with peritoneal carcinomatosis.  Spoke with Dr. Donalynn Furlong at [facility].  Last day for initiation of treatment on trial was 11/26/2016.  He is off study.  There are no other clinical trials.  He is a candidate for palliative care/Hospice given his performance status.  If he would improve, could try an irinotecan based regimen (FOLFIRI).  Spoke at length today with patient's father both in and out of the room with the assistance of the interpreter.  Patient has no medical power of attorney if he can not speak for  himself.  Discussed code status.  Patient's father said he would talk to his wife and the rest of the family.  He is hesistant about talking to the patient as he does not want to take his hope away.  I will discuss code status with the patient this weekend.  2.  Gastroenterology:  EGD reveals an enlarging non-obstructive gastric ulcer.  Patient has significant dysmotility.  Trial of Reglan has been unsuccessful. He has had no significant nausea relief with ondansetron and compazine.  Discussed possible erythromycin (shortage per Dr. Tressia Miners). Patient on Decadron 10 mg BID, Marinol, Ativan, Zyprexa, and scopolamine patch.  Patient on thorazine for singultus.  Patient is not a candidate for drainage gastrostomy tube secondary to ascites and gastric malignancy.  Discussed with Dr Marius Ditch.  There was no obstruction.  She easily went into the 2nd part of the  duodenum.  A stent would not be helpful.  He could have a soft Dubhoff placed for feedings.  An NG could be placed, but some risk for perforation.  Agree that TPN is not a viable option (unless used as a bridge during treatment) given his advancing malignancy.    No apparent obstruction distal to duodenum, although patient at risk secondary to peritoneal metastasis.  Unclear if patient would tolerate an upper GI with SBFT given ongoing emesis.  Will discuss with GI consideration of bile acid binding agent (cholestyramine/Questran or Welchol).  Unclear if this will decrease bilious emesis.  Doubt CNS metastasis contributing to nausea (rare < 1% incidence in gastric cancer).  Check B1, B12, and folate (ordered).  3.  Disposition:  Multiple issues were discussed with the patient's father today with the assistance of the interpreter.  Will contact [facility] tomorrow for any options.  Code status discussions started as a Hospice candidate.  Agree with palliative care consult.    Lequita Asal, MD  11/28/2016

## 2016-11-29 NOTE — Progress Notes (Signed)
[place] [facility] [place] NAME: Anthony Melendez    MR#:  [id number]  DATE OF BIRTH:  1980-03-06    CHIEF COMPLAINT:   Chief Complaint  Patient presents with  . Abdominal Pain   -  Patient continues to have ongoing nausea and vomiting. Multiple family members at bedside.  REVIEW OF SYSTEMS:  Review of Systems  Constitutional: Positive for malaise/fatigue and weight loss. Negative for chills and fever.  HENT: Negative for congestion, ear discharge, hearing loss and nosebleeds.   Eyes: Negative for blurred vision and double vision.  Respiratory: Negative for cough, shortness of breath and wheezing.   Cardiovascular: Negative for chest pain, palpitations and leg swelling.  Gastrointestinal: Positive for abdominal pain, nausea and vomiting. Negative for constipation and diarrhea.  Genitourinary: Negative for dysuria and urgency.  Musculoskeletal: Positive for back pain. Negative for myalgias.  Neurological: Negative for dizziness, seizures and headaches.  Psychiatric/Behavioral: Negative for depression.    DRUG ALLERGIES:   Allergies  Allergen Reactions  . Iodine     Possible anaphylaxis per MD Per conversation with pt, pt denies allergy to IV contrast.  Pt injected 09/26/16 without premeds, pt with no reaction.    VITALS:  Blood pressure (!) 101/55, pulse 68, temperature 98.2 F (36.8 C), temperature source Axillary, resp. rate 16, height 5\' 5"  (1.651 m), weight 74.1 kg (163 lb 6.4 oz), SpO2 97 %.  PHYSICAL EXAMINATION:  Physical Exam  GENERAL:  36 year old ill-nourished patient lying in the bed, appears miserable due to constant nausea and vomiting. EYES: Pupils equal, round, reactive to light and accommodation. No scleral icterus. Extraocular muscles intact. HEENT: Head atraumatic, normocephalic. Oropharynx and nasopharynx clear. NECK:  Supple, no jugular venous distention. No thyroid enlargement, no tenderness.  LUNGS:  Normal breath sounds bilaterally, no wheezing, rales,rhonchi or crepitation. No use of accessory muscles of respiration. Decreased bibasilar breath sounds. CARDIOVASCULAR: S1, S2 normal. No murmurs, rubs, or gallops.  ABDOMEN: Soft, nontender but discomfort on palpation in left and right upper quadrant, distended and tight/firm. Hypoactive Bowel sounds present. No organomegaly or mass.  EXTREMITIES: No pedal edema, cyanosis, or clubbing.  NEUROLOGIC: Moves all 4 extremities PSYCHIATRIC: The patient is awake and alert. SKIN: No obvious rash, lesion, or ulcer.    LABORATORY PANEL:   CBC  Recent Labs Lab 11/28/16 0545  WBC 9.8  HGB 10.2*  HCT 31.5*  PLT 222   ------------------------------------------------------------------------------------------------------------------  Chemistries   Recent Labs Lab 11/24/16 0351 11/26/16 0616 11/28/16 0545  NA 138 144 140  K 3.5 4.0 3.9  CL 109 109 107  CO2 24 28 28   GLUCOSE 207* 167* 154*  BUN 8 17 22*  CREATININE 0.63 0.57* 0.64  CALCIUM 8.6* 8.1* 8.1*  MG  --  2.3  --   AST 22  --   --   ALT 12*  --   --   ALKPHOS 90  --   --   BILITOT 0.7  --   --    ------------------------------------------------------------------------------------------------------------------  Cardiac Enzymes No results for input(s): TROPONINI in the last 168 hours. ------------------------------------------------------------------------------------------------------------------  RADIOLOGY:  No results found.  EKG:   Orders placed or performed during the hospital encounter of 11/20/16  . EKG 12-Lead  . EKG 12-Lead  . EKG 12-Lead  . EKG 12-Lead    ASSESSMENT AND PLAN:   36 year old male with past medical history significant for metastatic gastric cancer status post large volume paracentesis presents to hospital secondary to  abdominal pain and nausea.  # Community-acquired pneumonia-left lower lobe infiltrate noted on chest x-ray. - Blood  cultures are negative. Currently on zosyn. Stopped after 7 days - Rocephin changed to Zosyn for intra-abdominal coverage  # GI tract dysmotility- causing abdominal pain and nausea-has had intermittent abdominal pain associated with nausea secondary to his underlying malignancy. -Prior large-volume paracentesis, however paracentesis attempted to 3 weeks ago at [facility] emergency room and no fluid could be removed. This admission only 600 cc removed -CT of the abdomen done, patient unable to drink the whole oral contrast. Did not show any obstruction, distended stomach noted though. -GI consult appreciated, s/p upper GI endoscopy on 11/24/16 - showing increased ulcerated gastric cancer in stomach, significant GI tract dysmotility - Not a candidate for PEG-J tube.. -On Zyprexa, Haldol, Decadron, Marinol. 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 Unfortunately no treatment options available per oncology. Discussed with Dr. Mike Gip.  She will call [facility] to see if any other treatment options available.  # DVT prophylaxis - lovenox   All the records are reviewed and case discussed with Care Management/Social Workerr. Management plans discussed with the patient, family and they are in agreement.  CODE STATUS: Full code  TOTAL TIME TAKING CARE OF THIS PATIENT: 42  minutes.   POSSIBLE D/C ? days, DEPENDING ON CLINICAL CONDITION.   Hillary Bow R M.D on 11/29/2016 at 12:43 PM  Between 7am to 6pm - Pager - [phone]  After 6pm go to [email or web address] - password [email or web address] [facility]  Office   [phone]  CC: Primary care physician; Patient, No Pcp Per

## 2016-11-29 NOTE — Progress Notes (Signed)
Pt laying in bed with multiple adult and children (toddlers and babies) in room.  Father is spanish speaking only.  Scheduled medications have been refused and family is requesting prn medications only.  Pt complains of chest pain/upper gastric/throat pain and was given viscous lidocaine.  Pt complained of pain and was given dilaudid.  Pt complained of hiccops and ativan and phenergan was given.  Pt finally fell asleep but family trying to wake pt.  Explained that he needs sleep and rest from emesis and to let him sleep.  Dorna Bloom RN

## 2016-11-29 NOTE — Progress Notes (Signed)
Telephone call to [place] [phone] spoke with Wendelyn Breslow, gave pt's last set of VS and lab results from 11/28/16

## 2016-11-29 NOTE — Progress Notes (Signed)
Anthony Melendez Hematology/Oncology Progress Note  Date of admission: 11/20/2016  Hospital day:  11/29/2016  Chief Complaint: Anthony Melendez is a 36 y.o. male with metastatic gastric cancer who was admitted with abdominal pain, anorexia, and weight loss.  Subjective:  Patient notes everything is "the same".  He has some epigastric pain (controlled), nausea and vomiting.  He states that he has eaten some fruit and it "stayed down".  He wants to try to eat more.  He states that he has been up walking in the hall (not witnessed by staff).  Social History: The patient is accompanied by his father, mother, 50 family members, and the interpreter today.  Much of the conversation is held away from the patient at the request of his parents.  Allergies:  Allergies  Allergen Reactions  . Iodine     Possible anaphylaxis per MD Per conversation with pt, pt denies allergy to IV contrast.  Pt injected 09/26/16 without premeds, pt with no reaction.    Scheduled Medications: . calcium carbonate  1 tablet Oral TID WC  . calcium carbonate  1 tablet Oral Once  . dexamethasone  10 mg Intravenous Q12H  . dronabinol  2.5 mg Oral BID AC  . enoxaparin (LOVENOX) injection  40 mg Subcutaneous Q24H  . feeding supplement (ENSURE ENLIVE)  237 mL Oral BID BM  . haloperidol lactate  0.5 mg Intravenous Q6H  . metoCLOPramide (REGLAN) injection  10 mg Intravenous TID AC & HS  . nystatin  5 mL Oral QID  . OLANZapine  10 mg Oral QHS  . pantoprazole (PROTONIX) IV  40 mg Intravenous Q24H  . scopolamine  1 patch Transdermal Q72H  . sodium chloride flush  10-40 mL Intracatheter Q12H    Review of Systems: GENERAL:  Fatigue.  No fevers or sweats.  Ongoing weight loss. PERFORMANCE STATUS (ECOG):  3 HEENT:  No visual changes, runny nose, sore throat, mouth sores or tenderness. Lungs: No shortness of breath or cough.  No hemoptysis. Cardiac:  No chest pain, palpitations, orthopnea, or PND. GI:   Nausea and vomiting.  Minimal oral intake for 2-3 weeks.  Epigastric pain.  No diarrhea, constipation, melena or hematochezia. GU:  No urgency, frequency, dysuria, or hematuria. Musculoskeletal:  Back pain.  No joint pain.  No muscle tenderness. Extremities:  No pain or swelling. Skin:  No rashes or skin changes. Neuro:  No headache, numbness or weakness, balance or coordination issues. Endocrine:  No diabetes, thyroid issues, hot flashes or night sweats. Psych:  No mood changes, depression or anxiety. Pain:  Epigastric pain. Review of systems:  All other systems reviewed and found to be negative.  Physical Exam: Blood pressure 95/65, pulse 92, temperature 98.2 F (36.8 C), temperature source Axillary, resp. rate 20, height 5\' 5"  (1.651 m), weight 163 lb 6.4 oz (74.1 kg), SpO2 95 %. GENERAL:  Chronically ill appearing gentleman lying in bed and intermittently sitting up to vomit small amounts of bile. HEAD:  Thin dark hair.  Temporal wasting.  Normocephalic, atraumatic, face symmetric, no Cushingoid features. EYES:  Brown eyes.  Pupils equal round.  No conjunctivitis or scleral icterus. ENT:  No oral lesions.  No thrush. RESPIRATORY:  Clear to auscultation anteriorly without rales, wheezes or rhonchi. CARDIOVASCULAR:  Regular rate and rhythm without murmur, rub or gallop. ABDOMEN:  Soft, non-tender to palpation.  Firm epigastric area.  Active bowel sounds and no hepatosplenomegaly.   SKIN:  No rashes, ulcers or lesions. EXTREMITIES: No edema, no  skin discoloration or tenderness.  No palpable cords. NEUROLOGICAL: Appropriate.   Results for orders placed or performed during the hospital encounter of 11/20/16 (from the past 48 hour(s))  CBC     Status: Abnormal   Collection Time: 11/28/16  5:45 AM  Result Value Ref Range   WBC 9.8 3.8 - 10.6 K/uL   RBC 4.13 (L) 4.40 - 5.90 MIL/uL   Hemoglobin 10.2 (L) 13.0 - 18.0 g/dL   HCT 31.5 (L) 40.0 - 52.0 %   MCV 76.3 (L) 80.0 - 100.0 fL   MCH  24.7 (L) 26.0 - 34.0 pg   MCHC 32.4 32.0 - 36.0 g/dL   RDW 15.8 (H) 11.5 - 14.5 %   Platelets 222 150 - 440 K/uL  Basic metabolic panel     Status: Abnormal   Collection Time: 11/28/16  5:45 AM  Result Value Ref Range   Sodium 140 135 - 145 mmol/L   Potassium 3.9 3.5 - 5.1 mmol/L   Chloride 107 101 - 111 mmol/L   CO2 28 22 - 32 mmol/L   Glucose, Bld 154 (H) 65 - 99 mg/dL   BUN 22 (H) 6 - 20 mg/dL   Creatinine, Ser 0.64 0.61 - 1.24 mg/dL   Calcium 8.1 (L) 8.9 - 10.3 mg/dL   GFR calc non Af Amer >60 >60 mL/min   GFR calc Af Amer >60 >60 mL/min    Comment: (NOTE) The eGFR has been calculated using the CKD EPI equation. This calculation has not been validated in all clinical situations. eGFR's persistently <60 mL/min signify possible Chronic Kidney Disease.    Anion gap 5 5 - 15   No results found.  Assessment:  Anthony Melendez is a 36 y.o. male with stage IV gastric cancer who was admitted with chronic nausea, vomiting, decreased oral intake, and weight loss.  He initially presented in 07/2015.  Disease has progressed following FOLFOX (08/14/2015 - 02/05/2016), ramucirumab + Taxol (05/01/2016 - 08/20/2016), and pembrolizumab (09/09/2016 - 09/30/2016).  Until recently, he has required large volume paracentesis secondary to malignant ascites.  He has been seen at [facility] for clinical trial enrollment ([id number]).  Treatment postponed from 10/15 to 11/25/2016 secondary to ER visit at [facility] with abdominal pain, nausea and vomiting.   EGD on 11/24/2016 revealed one non-obstructing oozing cratered gastric ulcer of significant severity along the greater curvature of the stomach. There was no evidence of perforation. Findings were consistent with gastric carcinoma, size is increased compared to EGD from 02/2016.  There was a large amount of bilious fluid in the gastric fundus, gastric body, middle and lower third of the esophagus.  Findings were highly concerning for significant dysmotility of the GI  tract explaining patient's symptoms of intractable hiccups, nausea and vomiting.  Plan: 1.  Oncology:  Metastatic gastric cancer with peritoneal carcinomatosis.  Spoke with Dr. Donalynn Furlong at [facility].  Last day for initiation of treatment on trial was 11/26/2016.  He is off study.  There are no other clinical trials.  He is a candidate for palliative care/Hospice given his performance status.  If he would improve, could try an irinotecan based regimen (FOLFIRI).  Spoke at length today with patient's father both in and out of the room with the assistance of the interpreter yesterday.  Patient has no medical power of attorney if he can not speak for himself.  Discussed code status.  Patient's father said he would talk to his wife and the rest of the family.  He is hesistant  about talking to the patient as he does not want to take his hope away.    Today, I spoke with the patient's parents and multiple family members outside of the room.  They had numerous questions.  It was reinforced that he has terminal (stage IV) disease.  He has gone through 3 standard treatment regimens and then attempted enrollment in a clinical trial at [facility].  He has continued to decline in health.  I discussed supportive care.  It is unclear if he will improve to allow any further treatment.  I have reached out to [facility] and am waiting a call back.  I discussed the importance of the patient knowing what is going on.  There was significant disagreement among family members if he should know.  The patient's mother left the conversation as she does not want him to know how sick he is.  The father asked that I wait to talk to the patient after the decision is made from [facility] about possible transfer.  I discussed code status issues again.    2.  Gastroenterology:  EGD reveals an enlarging non-obstructive gastric ulcer.  Patient has significant dysmotility.  Trial of Reglan has been unsuccessful. He has had no significant nausea relief  with ondansetron and compazine.  Discussed possible erythromycin (shortage per Dr. Tressia Miners). Patient on Decadron 10 mg BID, Marinol, Ativan, Zyprexa, and scopolamine patch.  Patient on thorazine for singultus.  Patient is not a candidate for drainage gastrostomy tube secondary to ascites and gastric malignancy.  Discussed with Dr Marius Ditch, gastroenterologist.  There was no obstruction.  She easily went into the 2nd part of the duodenum.  A stent would not be helpful.  He could have a soft Dubhoff placed for feedings.  An NG could be placed, but some risk for perforation.  Agree that TPN is not a viable option (unless used as a bridge during treatment) given his advancing malignancy.    No apparent obstruction distal to duodenum, although patient at risk secondary to peritoneal metastasis.  Unclear if patient would tolerate an upper GI with SBFT given ongoing emesis.  Will discuss with GI consideration of bile acid binding agent (cholestyramine/Questran or Welchol).  Unclear if this will decrease bilious emesis.  Doubt CNS metastasis contributing to nausea (rare < 1% incidence in gastric cancer).  Check B1, B12, and folate (ordered).  3.  Disposition:  Multiple issues were discussed with the patient's entire  today with the assistance of the interpreter.  Awaiting call back from [place] today.  Code status discussions ongoing with family as patient is a Hospice candidate.  Agree with palliative care consult.   More than 20-25 minutes spent with direct patient and family contact today.  Additional time spent coordinating care.   Lequita Asal, MD  11/29/2016, 3:28 PM

## 2016-11-29 NOTE — Plan of Care (Signed)
Problem: Activity: Goal: Risk for activity intolerance will decrease Outcome: Not Progressing Pt's family remains at the bedside doing everything for the pt

## 2016-11-29 NOTE — Progress Notes (Signed)
[place] called to obtain pt data; need to call Kat at [phone]

## 2016-11-29 NOTE — Plan of Care (Signed)
Problem: Pain Managment: Goal: General experience of comfort will improve Outcome: Progressing PRN IV Dilaudid given for c/o pain with relief

## 2016-11-29 NOTE — Progress Notes (Addendum)
Advance care planning  Patient's parents and siblings requested to meet outside without patient being present.  Discussed with patient through interpreter that it is okay to him meet and discuss without the patient present.  Family is requesting that we did not discuss the terminal diagnosis with the patient.  I explained to them that patient needs to know his diagnosis and I would not keep information from him.  They disagreed with me in spite of explaining to them multiple times.  Family is frustrated that they have heard different things from different providers.  They somehow got an impression that his endoscopy did not show cancer.  When I explained to them that he has advanced stage IV stomach cancer they were surprised that no one had mentioned this to them till now.  Explained to them that all records including the conversation with the oncologist and rest of the medical team was about the stage IV cancer.  Presently no treatment options available for the cancer as per conversation with [facility].  They are requesting we discussed with [facility].  Dr. Mike Gip has called the oncology department at [facility] and is waiting to hear back.  Further management as per [facility] recommendations.  If there are no further treatment options available we will need to discuss about hospice.  Explained to them that without treatment his survival is likely a few weeks to months.  They did ask me about further course if [facility] is unable to offer any treatment.  Explained to them that symptom management would be a priority including pain and sedative medications.  Time spent 20 minutes

## 2016-11-30 ENCOUNTER — Inpatient Hospital Stay: Payer: 59

## 2016-11-30 DIAGNOSIS — R066 Hiccough: Secondary | ICD-10-CM

## 2016-11-30 DIAGNOSIS — R609 Edema, unspecified: Secondary | ICD-10-CM

## 2016-11-30 LAB — FOLATE: Folate: 2.3 ng/mL — ABNORMAL LOW (ref >5.9)

## 2016-11-30 LAB — VITAMIN B12: Vitamin B-12: 468 pg/mL (ref 180–914)

## 2016-11-30 MED ORDER — SUCRALFATE 1 GM/10ML PO SUSP
1.0000 g | Freq: Three times a day (TID) | ORAL | Status: DC
  Administered 2016-11-30 (×2): 1 g via ORAL
  Filled 2016-11-30 (×4): qty 10

## 2016-11-30 MED ORDER — FOLIC ACID 1 MG PO TABS
1.0000 mg | ORAL_TABLET | Freq: Every day | ORAL | Status: DC
  Filled 2016-11-30: qty 1

## 2016-11-30 NOTE — Progress Notes (Signed)
[facility] [place] NAME: Anthony Melendez    MR#:  [id number]  DATE OF BIRTH:  January 05, 1981    CHIEF COMPLAINT:   Chief Complaint  Patient presents with  . Abdominal Pain   Spanish interpreter.  Continues to have nausea and vomiting.  Feels weak.  Afebrile.  REVIEW OF SYSTEMS:  Review of Systems  Constitutional: Positive for malaise/fatigue and weight loss. Negative for chills and fever.  HENT: Negative for congestion, ear discharge, hearing loss and nosebleeds.   Eyes: Negative for blurred vision and double vision.  Respiratory: Negative for cough, shortness of breath and wheezing.   Cardiovascular: Negative for chest pain, palpitations and leg swelling.  Gastrointestinal: Positive for abdominal pain, nausea and vomiting. Negative for constipation and diarrhea.  Genitourinary: Negative for dysuria and urgency.  Musculoskeletal: Positive for back pain. Negative for myalgias.  Neurological: Negative for dizziness, seizures and headaches.  Psychiatric/Behavioral: Negative for depression.    DRUG ALLERGIES:   Allergies  Allergen Reactions  . Iodine     Possible anaphylaxis per MD Per conversation with pt, pt denies allergy to IV contrast.  Pt injected 09/26/16 without premeds, pt with no reaction.    VITALS:  Blood pressure 92/61, pulse 97, temperature 98.4 F (36.9 C), temperature source Oral, resp. rate 12, height 5\' 5"  (1.651 m), weight 71.3 kg (157 lb 4 oz), SpO2 95 %.  PHYSICAL EXAMINATION:  Physical Exam  GENERAL:  36 y.o.-year-old ill-nourished patient lying in the bed, appears miserable due to constant nausea and vomiting. EYES: Pupils equal, round, reactive to light and accommodation. No scleral icterus. Extraocular muscles intact. HEENT: Head atraumatic, normocephalic. Oropharynx and nasopharynx clear. NECK:  Supple, no jugular venous distention. No thyroid enlargement, no tenderness.  LUNGS: Normal breath sounds  bilaterally, no wheezing, rales,rhonchi or crepitation. No use of accessory muscles of respiration. Decreased bibasilar breath sounds. CARDIOVASCULAR: S1, S2 normal. No murmurs, rubs, or gallops.  ABDOMEN: Soft, nontender but discomfort on palpation in left and right upper quadrant, distended and tight/firm. Hypoactive Bowel sounds present. No organomegaly or mass.  EXTREMITIES: Lower extremity edema NEUROLOGIC: Moves all 4 extremities PSYCHIATRIC: The patient is awake and alert. SKIN: No obvious rash, lesion, or ulcer.    LABORATORY PANEL:   CBC  Recent Labs Lab 11/28/16 0545  WBC 9.8  HGB 10.2*  HCT 31.5*  PLT 222   ------------------------------------------------------------------------------------------------------------------  Chemistries   Recent Labs Lab 11/24/16 0351 11/26/16 0616 11/28/16 0545  NA 138 144 140  K 3.5 4.0 3.9  CL 109 109 107  CO2 24 28 28   GLUCOSE 207* 167* 154*  BUN 8 17 22*  CREATININE 0.63 0.57* 0.64  CALCIUM 8.6* 8.1* 8.1*  MG  --  2.3  --   AST 22  --   --   ALT 12*  --   --   ALKPHOS 90  --   --   BILITOT 0.7  --   --    ------------------------------------------------------------------------------------------------------------------  Cardiac Enzymes No results for input(s): TROPONINI in the last 168 hours. ------------------------------------------------------------------------------------------------------------------  RADIOLOGY:  No results found.  EKG:   Orders placed or performed during the hospital encounter of 11/20/16  . EKG 12-Lead  . EKG 12-Lead  . EKG 12-Lead  . EKG 12-Lead    ASSESSMENT AND PLAN:   36 year old male with past medical history significant for metastatic gastric cancer status post large volume paracentesis presents to hospital secondary to abdominal pain and nausea.  #  Community-acquired pneumonia-left lower lobe infiltrate noted on chest x-ray. - Blood cultures are negative. Currently on zosyn.  Stopped after 7 days - Rocephin changed to Zosyn for intra-abdominal coverage  # GI tract dysmotility- causing abdominal pain and nausea-has had intermittent abdominal pain associated with nausea secondary to his underlying malignancy. -Prior large-volume paracentesis, however paracentesis attempted to 3 weeks ago at [facility] emergency room and no fluid could be removed. This admission only 600 cc removed -CT of the abdomen done, patient unable to drink the whole oral contrast. Did not show any obstruction, distended stomach noted though. -GI consult appreciated, s/p upper GI endoscopy on 11/24/16 - showing increased ulcerated gastric cancer in stomach, significant GI tract dysmotility -On Zyprexa, Haldol, Decadron, Marinol. 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 Unfortunately no treatment options available per oncology.  [facility] onc[facility] and GI contacted by Dr. Mike Gip.  Advised NG tube and small bowel follow-through to look for obstruction.  Transferred to [facility] if there is a single obstruction for push enteroscopy. NG tube to be placed tomorrow by radiology followed by small bowel follow-through.  #Lower extremity edema.  Will check venous Dopplers to rule out DVT.  # DVT prophylaxis - lovenox   CODE STATUS: Full code  TOTAL TIME TAKING CARE OF THIS PATIENT: 25  minutes.    Hillary Bow R M.D on 11/30/2016 at 12:09 PM  Between 7am to 6pm - Pager - [phone]  After 6pm go to [email or web address] - password [email or web address] [facility]  Office  [phone]  CC: Primary care physician; Patient, No Pcp  Per

## 2016-11-30 NOTE — Plan of Care (Signed)
Problem: Nutrition: Goal: Adequate nutrition will be maintained Outcome: Not Progressing Pt able to take sips of Power Ade drink from home intermittently; continues to have n/v, see PRN medications given; no PO intake from hospital meal trays.

## 2016-11-30 NOTE — Plan of Care (Signed)
Problem: Safety: Goal: Ability to remain free from injury will improve Outcome: Progressing Pt remains on Moderate Fall Risks; family at the bedside

## 2016-11-30 NOTE — Progress Notes (Signed)
[facility] Progress Note  Date of admission: 11/20/2016  Hospital day:  11/30/2016  Chief Complaint: Anthony Melendez is a 36 y.o. male with metastatic gastric cancer who was admitted with abdominal pain, anorexia, and weight loss.  Subjective:  Patient denies any new symptoms.  He is eating some (1 pear and 2-3 bites of mashed potatoes and rice yesterday).  Does not like vanilla Ensure.  Epigastric pain (controlled).  Intermittent nausea and vomiting. Singultus.  Social History: The patient is accompanied by his father, mother, 2 other family members, and the interpreter today.  Much of the conversation is held away from the patient with his parents at the request of his parents.  Allergies:  Allergies  Allergen Reactions  . Iodine     Possible anaphylaxis per MD Per conversation with pt, pt denies allergy to IV contrast.  Pt injected 09/26/16 without premeds, pt with no reaction.    Scheduled Medications: . calcium carbonate  1 tablet Oral TID WC  . calcium carbonate  1 tablet Oral Once  . dexamethasone  10 mg Intravenous Q12H  . dronabinol  2.5 mg Oral BID AC  . enoxaparin (LOVENOX) injection  40 mg Subcutaneous Q24H  . feeding supplement (ENSURE ENLIVE)  237 mL Oral BID BM  . folic acid  1 mg Oral Daily  . haloperidol lactate  0.5 mg Intravenous Q6H  . metoCLOPramide (REGLAN) injection  10 mg Intravenous TID AC & HS  . nystatin  5 mL Oral QID  . OLANZapine  10 mg Oral QHS  . pantoprazole (PROTONIX) IV  40 mg Intravenous Q24H  . scopolamine  1 patch Transdermal Q72H  . sucralfate  1 g Oral TID WC & HS    Review of Systems: GENERAL:  Fatigue.  No fevers or sweats.  Ongoing weight loss. PERFORMANCE STATUS (ECOG):  3 HEENT:  Mouth sore.  No visual changes, runny nose, sore throat, or tenderness. Lungs: No shortness of breath or cough.  No hemoptysis. Cardiac:  No chest pain, palpitations, orthopnea, or PND. GI:  Nausea and  vomiting.  Eating some (see aboveI).  Epigastric pain.  No diarrhea, constipation, melena or hematochezia. GU:  No urgency, frequency, dysuria, or hematuria. Musculoskeletal:  Back pain.  No joint pain.  No muscle tenderness. Extremities:  No pain or swelling. Skin:  No rashes or skin changes. Neuro:  No headache, numbness or weakness, balance or coordination issues. Endocrine:  No diabetes, thyroid issues, hot flashes or night sweats. Psych:  No mood changes, depression or anxiety.  Mother concerned about patient giving up if he knows what is going on. Pain:  Epigastric pain. Review of systems:  All other systems reviewed and found to be negative.  Physical Exam: Blood pressure 92/61, pulse 97, temperature 98.4 F (36.9 C), temperature source Oral, resp. rate 12, height 5\' 5"  (1.651 m), weight 157 lb 4 oz (71.3 kg), SpO2 95 %. GENERAL:  Chronically ill appearing gentleman sitting on the side of the bed in no acute distress.  No emesis while in room. HEAD:  Thin dark hair.  Temporal wasting.  Normocephalic, atraumatic, face symmetric, no Cushingoid features. EYES:  Brown eyes.  Pupils equal round and reactive to light and accomodation.  No conjunctivitis or scleral icterus. ENT:  No oral lesions.  No thrush. Mucous membranes moist. RESPIRATORY:  Clear to auscultation anteriorly without rales, wheezes or rhonchi. CARDIOVASCULAR:  Regular rate and rhythm without murmur, rub or gallop. ABDOMEN:  Soft, minimally tender to  palpation.  Firm epigastric area.  Active bowel sounds and no hepatosplenomegaly.   SKIN:  No rashes, ulcers or lesions. EXTREMITIES: Left foot edema.  No skin discoloration or tenderness.  No palpable cords. NEUROLOGICAL: Appropriate.   Results for orders placed or performed during the hospital encounter of 11/20/16 (from the past 48 hour(s))  Folate     Status: Abnormal   Collection Time: 11/30/16  4:03 AM  Result Value Ref Range   Folate 2.3 (L) >5.9 ng/mL  Vitamin B12      Status: None   Collection Time: 11/30/16  4:03 AM  Result Value Ref Range   Vitamin B-12 468 180 - 914 pg/mL    Comment: (NOTE) This assay is not validated for testing neonatal or myeloproliferative syndrome specimens for Vitamin B12 levels. Performed at [facility], [place] [address], [place], [place] [address]    No results found.  Assessment:  Anthony Melendez is a 36 y.o. male with stage IV gastric cancer who was admitted with chronic nausea, vomiting, decreased oral intake, and weight loss.  He initially presented in 07/2015.  Disease has progressed following FOLFOX (08/14/2015 - 02/05/2016), ramucirumab + Taxol (05/01/2016 - 08/20/2016), and pembrolizumab (09/09/2016 - 09/30/2016).  Until recently, he has required large volume paracentesis secondary to malignant ascites.  He has been seen at [facility] for clinical trial enrollment ([id number]).  Treatment postponed from 10/15 to 11/25/2016 secondary to ER visit at [facility] with abdominal pain, nausea and vomiting.   EGD on 11/24/2016 revealed one non-obstructing oozing cratered gastric ulcer of significant severity along the greater curvature of the stomach. There was no evidence of perforation. Findings were consistent with gastric carcinoma, size is increased compared to EGD from 02/2016.  There was a large amount of bilious fluid in the gastric fundus, gastric body, middle and lower third of the esophagus.  Findings were highly concerning for significant dysmotility of the GI tract explaining patient's symptoms of intractable hiccups, nausea and vomiting.  Plan: 1.  Oncology:  Metastatic gastric cancer with peritoneal carcinomatosis.  Spoke with Dr. Donalynn Furlong at [facility].  Last day for initiation of treatment on trial was 11/26/2016.  He is off study.  There are no other clinical trials.  He is a candidate for palliative care/Hospice given his performance status.  If he would improve, could try an irinotecan based regimen  (FOLFIRI).  Spoke at length on 11/28/2016 with patient's father both in and out of the room with the assistance of the interpreter yesterday.  Patient has no medical power of attorney if he can not speak for himself.  Discussed code status.  Patient's father said he would talk to his wife and the rest of the family.  He is hesistant about talking to the patient as he does not want to take his hope away.    Spoke with the patient's parents and multiple family members outside of the room on 11/29/2016.  They had numerous questions.  It was reinforced that he has terminal (stage IV) disease.  He has gone through 3 standard treatment regimens and then attempted enrollment in a clinical trial at [facility].  He has continued to decline in health.  I discussed supportive care.  It is unclear if he will improve to allow any further treatment.  I have reached out to [facility] and am waiting a call back.  Spoke today with the patient's parents outside of room per their request.  Discussed my conversation with [facility] (medical oncology- Dr. Ysidro Evert Force  and gastroenterology- Dr. Tillie Rung).  He is not eligible for a clinical trial unless his performance status improves (0-1), "he has to walk into clinic and take care of himself".  GI recommended Carafate.  They also recommended NG tube placement for short term comfort and prior to UGI with SBFT to rule out an isolated small bowel obstruction.  If he has one obstruction, he would be a candidate for push enteroscopy and possible stent placement.  He would then be transferred to the general medicine service at [facility].  He would not be a candidate if he has multi-focal obstruction.  I discussed the risk versus benefit of the procedure.  Parents agree to proceeding.  Will arrange with radiology in AM.  I again discussed the importance of the patient knowing what is going on.  There continues to be significant disagreement among family members (including his parents).  I stated that  I will discuss these issues with the patient tomorrow after the UGI with SBFT.    2.  Gastroenterology:  EGD reveals an enlarging non-obstructive gastric ulcer.  Patient has significant dysmotility.  Trial of Reglan has been unsuccessful. He has had no significant nausea relief with ondansetron and compazine.  Discussed possible erythromycin (shortage per Dr. Tressia Miners). Patient on Decadron 10 mg BID, Marinol, Ativan, Zyprexa, and scopolamine patch.  Patient on thorazine for singultus.  Patient is not a candidate for drainage gastrostomy tube secondary to ascites and gastric malignancy.  Discussed with Dr Marius Ditch, gastroenterologist.  There was no obstruction.  She easily went into the 2nd part of the duodenum.  A stent would not be helpful.  He could have a soft Dubhoff placed for feedings.  An NG could be placed, but some risk for perforation.  Agree that TPN is not a viable option (unless used as a bridge during treatment) given his advancing malignancy.    No apparent obstruction distal to duodenum, although patient at risk secondary to peritoneal metastasis.  Plan for an upper GI with SBFT via an NG (to be placed tomorrow) given ongoing emesis.  Doubt CNS metastasis contributing to nausea (rare < 1% incidence in gastric cancer).  Await results from B1.  B12 is normal.  Patient with documented folate deficiency.  Folic acid started today.  3.  Hematology:  Discussed with the patient's family the importance of his Lovenox for DVT prophylaxis.  They declined administration of Lovenox last night.  He has a edematous left lower extremity/foot.  An ultrasound will be performed today.  4.  Disposition:  Multiple issues were discussed with the patient's parents today with the assistance of the interpreter.  Code status discussions ongoing with family as patient is a Hospice candidate if GI issue can not be resolved in the setting of stage IV gastric cancer.  Palliative care involved.   More than 25 minutes  spent with direct patient and family contact today.    Lequita Asal, MD  11/30/2016, 1:21 PM

## 2016-11-30 NOTE — Plan of Care (Signed)
Problem: Pain Managment: Goal: General experience of comfort will improve Outcome: Progressing PRN pain medication (Dilaudid) given upon request with intermittent relief

## 2016-12-01 ENCOUNTER — Inpatient Hospital Stay: Payer: 59 | Admitting: Anesthesiology

## 2016-12-01 ENCOUNTER — Encounter
Admission: EM | Disposition: A | Discharge status: Other Acute Inpatient Hospital | Payer: Self-pay | Source: Home / Self Care | Attending: Internal Medicine

## 2016-12-01 ENCOUNTER — Inpatient Hospital Stay: Payer: 59

## 2016-12-01 DIAGNOSIS — R1114 Bilious vomiting: Secondary | ICD-10-CM

## 2016-12-01 HISTORY — PX: ESOPHAGOGASTRODUODENOSCOPY (EGD) WITH PROPOFOL: SHX5813

## 2016-12-01 LAB — CREATININE, SERUM
Creatinine, Ser: 0.65 mg/dL (ref 0.61–1.24)
GFR calc Af Amer: >60 mL/min (ref >60)
GFR calc non Af Amer: >60 mL/min (ref >60)

## 2016-12-01 LAB — IRON AND TIBC
Iron: 27 ug/dL — ABNORMAL LOW (ref 45–182)
Saturation Ratios: 13 % — ABNORMAL LOW (ref 17.9–39.5)
TIBC: 215 ug/dL — ABNORMAL LOW (ref 250–450)
UIBC: 189 ug/dL

## 2016-12-01 LAB — CBC
HEMATOCRIT: 32.4 % — AB (ref 40.0–52.0)
Hemoglobin: 10.6 g/dL — ABNORMAL LOW (ref 13.0–18.0)
MCH: 24.9 pg — ABNORMAL LOW (ref 26.0–34.0)
MCHC: 32.7 g/dL (ref 32.0–36.0)
MCV: 76.2 fL — AB (ref 80.0–100.0)
Platelets: 164 10*3/uL (ref 150–440)
RBC: 4.25 MIL/uL — AB (ref 4.40–5.90)
RDW: 15.9 % — ABNORMAL HIGH (ref 11.5–14.5)
WBC: 9.1 10*3/uL (ref 3.8–10.6)

## 2016-12-01 SURGERY — ESOPHAGOGASTRODUODENOSCOPY (EGD) WITH PROPOFOL
Anesthesia: General

## 2016-12-01 MED ORDER — SUCCINYLCHOLINE CHLORIDE 20 MG/ML IJ SOLN
INTRAMUSCULAR | Status: DC | PRN
  Administered 2016-12-01: 80 mg via INTRAVENOUS

## 2016-12-01 MED ORDER — SODIUM CHLORIDE 0.9 % IV BOLUS (SEPSIS)
1000.0000 mL | Freq: Once | INTRAVENOUS | Status: AC
  Administered 2016-12-01: 22:00:00 1000 mL via INTRAVENOUS

## 2016-12-01 MED ORDER — PROPOFOL 10 MG/ML IV BOLUS
INTRAVENOUS | Status: DC | PRN
  Administered 2016-12-01: 90 mg via INTRAVENOUS
  Administered 2016-12-01: 100 mg via INTRAVENOUS

## 2016-12-01 MED ORDER — FENTANYL CITRATE (PF) 100 MCG/2ML IJ SOLN
INTRAMUSCULAR | Status: AC
  Filled 2016-12-01: qty 2

## 2016-12-01 MED ORDER — FENTANYL CITRATE (PF) 100 MCG/2ML IJ SOLN: 25.0000 ug | INTRAMUSCULAR | Status: DC | PRN

## 2016-12-01 MED ORDER — FENTANYL CITRATE (PF) 100 MCG/2ML IJ SOLN
INTRAMUSCULAR | Status: DC | PRN
  Administered 2016-12-01 (×2): 50 ug via INTRAVENOUS

## 2016-12-01 MED ORDER — PIPERACILLIN-TAZOBACTAM 3.375 G IVPB: 3.3750 g | Freq: Three times a day (TID) | INTRAVENOUS | Status: DC

## 2016-12-01 MED ORDER — SODIUM CHLORIDE 0.9 % IV SOLN
INTRAVENOUS | Status: DC | PRN
  Administered 2016-12-01: 11:00:00 via INTRAVENOUS

## 2016-12-01 MED ORDER — LIDOCAINE HCL 2 % EX GEL
1.0000 "application " | Freq: Once | CUTANEOUS | Status: AC
  Administered 2016-12-01: 1 via TOPICAL
  Filled 2016-12-01: qty 5

## 2016-12-01 MED ORDER — ONDANSETRON HCL 4 MG/2ML IJ SOLN: 4.0000 mg | Freq: Once | INTRAMUSCULAR | Status: DC | PRN

## 2016-12-01 MED ORDER — LIDOCAINE HCL 2 % EX GEL
CUTANEOUS | Status: AC
  Filled 2016-12-01: qty 5

## 2016-12-01 NOTE — Anesthesia Post-op Follow-up Note (Signed)
Anesthesia QCDR form completed.        

## 2016-12-01 NOTE — Anesthesia Procedure Notes (Signed)
Procedure Name: Intubation Date/Time: 12/01/2016 11:38 AM Performed by: Jonna Clark Pre-anesthesia Checklist: Patient identified, Patient being monitored, Timeout performed, Emergency Drugs available and Suction available Patient Re-evaluated:Patient Re-evaluated prior to induction Oxygen Delivery Method: Circle system utilized Preoxygenation: Pre-oxygenation with 100% oxygen Induction Type: IV induction, Rapid sequence and Cricoid Pressure applied Ventilation: Mask ventilation without difficulty Laryngoscope Size: Mac and 3 Grade View: Grade I Tube type: Oral Tube size: 7.0 mm Number of attempts: 1 Airway Equipment and Method: Stylet Placement Confirmation: ETT inserted through vocal cords under direct vision,  positive ETCO2 and breath sounds checked- equal and bilateral Secured at: 21 cm Tube secured with: Tape Dental Injury: Teeth and Oropharynx as per pre-operative assessment

## 2016-12-01 NOTE — Anesthesia Postprocedure Evaluation (Signed)
Anesthesia Post Note  Patient: Anthony Melendez  Procedure(s) Performed: ESOPHAGOGASTRODUODENOSCOPY (EGD) WITH PROPOFOL (N/A )  Patient location during evaluation: PACU Anesthesia Type: General Level of consciousness: awake Pain management: pain level controlled Vital Signs Assessment: post-procedure vital signs reviewed and stable Respiratory status: spontaneous breathing Cardiovascular status: stable Anesthetic complications: no     Last Vitals:  Vitals:   12/01/16 1239 12/01/16 1244  BP:    Pulse: 75 85  Resp: 19 (!) 23  Temp: (!) 36.1 C   SpO2: 100% 98%    Last Pain:  Vitals:   12/01/16 1233  TempSrc:   PainSc: 0-No pain                 VAN STAVEREN,Skyllar Notarianni

## 2016-12-01 NOTE — Progress Notes (Signed)
[facility] NAME: Anthony Melendez    MR#:  [id number]  DATE OF BIRTH:  1980/07/19  Saw the patient in PACU, had EGD and   NG-tube placed  By  EGD.  bilious drainage coming from NG tube.  CHIEF COMPLAINT:   Chief Complaint  Patient presents with  . Abdominal Pain   Spanish interpreter.  Continues to have nausea and vomiting.  Feels weak.  Afebrile.  REVIEW OF SYSTEMS:  Review of Systems  Constitutional: Positive for malaise/fatigue and weight loss. Negative for chills and fever.  HENT: Negative for congestion, ear discharge, hearing loss and nosebleeds.   Eyes: Negative for blurred vision and double vision.  Respiratory: Negative for cough, shortness of breath and wheezing.   Cardiovascular: Negative for chest pain, palpitations and leg swelling.  Gastrointestinal: Positive for abdominal pain, nausea and vomiting. Negative for constipation and diarrhea.  Genitourinary: Negative for dysuria and urgency.  Musculoskeletal: Positive for back pain. Negative for myalgias.  Neurological: Negative for dizziness, seizures and headaches.  Psychiatric/Behavioral: Negative for depression.    DRUG ALLERGIES:   Allergies  Allergen Reactions  . Iodine     Possible anaphylaxis per MD Per conversation with pt, pt denies allergy to IV contrast.  Pt injected 09/26/16 without premeds, pt with no reaction.    VITALS:  Blood pressure 109/67, pulse 85, temperature (!) 97 F (36.1 C), resp. rate (!) 23, height 5\' 5"  (1.651 m), weight 71.3 kg (157 lb 4 oz), SpO2 98 %.  PHYSICAL EXAMINATION:  Physical Exam  GENERAL:  36 y.o.-year-old ill-nourished patient lying in the bed, sleeping,seen in PACU.NG NG tube to LIS, constant bile drainage.   EYES: Pupils equal, round, reactive to light and accommodation. No scleral icterus. Extraocular muscles intact. HEENT: Head atraumatic, normocephalic. Oropharynx and nasopharynx clear. NECK:  Supple, no  jugular venous distention. No thyroid enlargement, no tenderness.  LUNGS: Normal breath sounds bilaterally, no wheezing, rales,rhonchi or crepitation. No use of accessory muscles of respiration. Decreased bibasilar breath sounds. CARDIOVASCULAR: S1, S2 normal. No murmurs, rubs, or gallops.  ABDOMEN: Soft, nontender but discomfort on palpation in left and right upper quadrant, distended and tight/firm. Hypoactive Bowel sounds present. No organomegaly or mass.  EXTREMITIES: Lower extremity edema NEUROLOGIC: Moves all 4 extremities PSYCHIATRIC: The patient is awake and alert. SKIN: No obvious rash, lesion, or ulcer.    LABORATORY PANEL:   CBC  Recent Labs Lab 12/01/16 0418  WBC 9.1  HGB 10.6*  HCT 32.4*  PLT 164   ------------------------------------------------------------------------------------------------------------------  Chemistries   Recent Labs Lab 11/26/16 0616 11/28/16 0545 12/01/16 0418  NA 144 140  --   K 4.0 3.9  --   CL 109 107  --   CO2 28 28  --   GLUCOSE 167* 154*  --   BUN 17 22*  --   CREATININE 0.57* 0.64 0.65  CALCIUM 8.1* 8.1*  --   MG 2.3  --   --    ------------------------------------------------------------------------------------------------------------------  Cardiac Enzymes No results for input(s): TROPONINI in the last 168 hours. ------------------------------------------------------------------------------------------------------------------  RADIOLOGY:  US Venous Img Lower Bilateral  Result Date: 11/30/2016 CLINICAL DATA:  Swelling EXAM: BILATERAL LOWER EXTREMITY VENOUS DOPPLER ULTRASOUND TECHNIQUE: Gray-scale sonography with compression, as well as color and duplex ultrasound, were performed to evaluate the deep venous system from the level of the common femoral vein through the popliteal and proximal calf veins. COMPARISON:  None FINDINGS: Normal compressibility of the common femoral,  superficial femoral, and popliteal veins, as well  as the proximal calf veins. No filling defects to suggest DVT on grayscale or color Doppler imaging. Doppler waveforms show normal direction of venous flow, normal respiratory phasicity and response to augmentation. IMPRESSION: No evidence of  lower extremity deep vein thrombosis, bilaterally. Electronically Signed   By: Lucrezia Europe M.D.   On: 11/30/2016 14:00    EKG:   Orders placed or performed during the hospital encounter of 11/20/16  . EKG 12-Lead  . EKG 12-Lead  . EKG 12-Lead  . EKG 12-Lead    ASSESSMENT AND PLAN:   36 year old male with past medical history significant for metastatic gastric cancer status post large volume paracentesis presents to hospital secondary to abdominal pain and nausea.  # Community-acquired pneumonia-left lower lobe infiltrate noted on chest x-ray. - Blood cultures are negative. Currently on zosyn. Stopped after 7 days - Rocephin changed to Zosyn for intra-abdominal coverage  # GI tract dysmotility- causing abdominal pain and nausea-has had intermittent abdominal pain associated with nausea secondary to his underlying malignancy. -Prior large-volume paracentesis, however paracentesis attempted to 3 weeks ago at [facility] emergency room and no fluid could be removed. This admission only 600 cc removed - -GI consult appreciated, s/p upper GI endoscopy on 11/24/16 - showing increased ulcerated gastric cancer in stomach, significant GI tract dysmotility Status post EGD, and NG-tube placement today  -On Zyprexa, Haldol, Decadron, Marinol. 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 Unfortunately no treatment  options available per oncology.  [facility] oncology and GI contacted by Dr. Mike Gip.  Advised NG tube and small bowel follow-through to look for obstruction.  Transferred to [facility] if there is a single obstruction for push enteroscopy.  small bowel follow-through.today,  #Lower extremity edema.  Will check venous Dopplers to rule out DVT.  # DVT prophylaxis - lovenox   CODE STATUS: Full code  TOTAL TIME TAKING CARE OF THIS PATIENT: 25  minutes.    Epifanio Lesches M.D on 12/01/2016 at 1:21 PM  Between 7am to 6pm - Pager - [phone]  After 6pm go to [email or web address] - password [email or web address] [facility]  Office  [phone]  CC: Primary care physician; Patient, No Pcp Per

## 2016-12-01 NOTE — Anesthesia Preprocedure Evaluation (Signed)
Anesthesia Evaluation  Patient identified by MRN, date of birth, ID band Patient confused    Reviewed: Allergy & Precautions  Airway Mallampati: III       Dental  (+) Poor Dentition   Pulmonary pneumonia,    + rhonchi  + decreased breath sounds      Cardiovascular Exercise Tolerance: Poor  Rhythm:Regular     Neuro/Psych negative psych ROS   GI/Hepatic Neg liver ROS, PUD, GERD  Medicated,Stomach ca   Endo/Other  negative endocrine ROS  Renal/GU      Musculoskeletal   Abdominal Normal abdominal exam  (+)   Peds negative pediatric ROS (+)  Hematology  (+) anemia ,   Anesthesia Other Findings   Reproductive/Obstetrics                             Anesthesia Physical Anesthesia Plan  ASA: III  Anesthesia Plan: General   Post-op Pain Management:    Induction: Intravenous  PONV Risk Score and Plan: 0  Airway Management Planned: Natural Airway and Nasal Cannula  Additional Equipment:   Intra-op Plan:   Post-operative Plan:   Informed Consent: I have reviewed the patients History and Physical, chart, labs and discussed the procedure including the risks, benefits and alternatives for the proposed anesthesia with the patient or authorized representative who has indicated his/her understanding and acceptance.     Plan Discussed with: CRNA  Anesthesia Plan Comments:         Anesthesia Quick Evaluation

## 2016-12-01 NOTE — Transfer of Care (Signed)
Immediate Anesthesia Transfer of Care Note  Patient: Anthony Melendez  Procedure(s) Performed: ESOPHAGOGASTRODUODENOSCOPY (EGD) WITH PROPOFOL (N/A )  Patient Location: PACU  Anesthesia Type:General  Level of Consciousness: drowsy and patient cooperative  Airway & Oxygen Therapy: Patient Spontanous Breathing and Patient connected to nasal cannula oxygen  Post-op Assessment: Report given to RN and Post -op Vital signs reviewed and stable  Post vital signs: Reviewed and stable  Last Vitals:  Vitals:   12/01/16 0407 12/01/16 1203  BP: 95/61 109/74  Pulse: 70 (!) 58  Resp: 16 20  Temp: 37 C (!) 36.1 C  SpO2: 97% 100%    Last Pain:  Vitals:   12/01/16 0407  TempSrc: Axillary  PainSc:       Patients Stated Pain Goal: 0 (10/26/28 0762)  Complications: No apparent anesthesia complications

## 2016-12-01 NOTE — Progress Notes (Signed)
Instructed father in [place] that pt is NPO for procedure in morning.  Pt may swish and spit but not to swallow any fluids or food until after procedure in morning. Father agrees. Dorna Bloom RN

## 2016-12-01 NOTE — Op Note (Signed)
[facility] Patient Name: Anthony Melendez Procedure Date: 12/01/2016 11:27 AM MRN: [id number] Account #: [id number] Date of Birth: 01/29/1981 Admit Type: Inpatient Age: 36 Room: [facility] ENDO ROOM 4 Gender: Male Note Status: Finalized Procedure:            Upper GI endoscopy Indications:          Nausea with vomiting with gastric cancer. Providers:            Lucilla Lame MD, MD Referring MD:         Forest Gleason Md, MD (Referring MD) Medicines:            General Anesthesia Complications:        No immediate complications. Procedure:            Pre-Anesthesia Assessment:                       - Prior to the procedure, a History and Physical was                        performed, and patient medications and allergies were                        reviewed. The patient's tolerance of previous                        anesthesia was also reviewed. The risks and benefits of                        the procedure and the sedation options and risks were                        discussed with the patient. All questions were                        answered, and informed consent was obtained. Prior                        Anticoagulants: The patient has taken no previous                        anticoagulant or antiplatelet agents. ASA Grade                        Assessment: III - A patient with severe systemic                        disease. After reviewing the risks and benefits, the                        patient was deemed in satisfactory condition to undergo                        the procedure.                       After obtaining informed consent, the endoscope was                        passed under direct vision. Throughout the procedure,  the patient's blood pressure, pulse, and oxygen                        saturations were monitored continuously. The                        Colonoscope was introduced through the mouth, and            advanced to the lower third of esophagus. The upper GI                        endoscopy was accomplished without difficulty. The                        patient tolerated the procedure well. Findings:      Food was found in the entire esophagus. Impression:           - Food in the esophagus.                       - No specimens collected. Recommendation:       - Return patient to hospital ward for ongoing care. Procedure Code(s):    --- Professional ---                       85885, Esophagoscopy, flexible, transoral; diagnostic,                        including collection of specimen(s) by brushing or                        washing, when performed (separate procedure) Diagnosis Code(s):    --- Professional ---                       R11.2, Nausea with vomiting, unspecified                       T18.128A, Food in esophagus causing other injury,                        initial encounter CPT copyright 2016 American Medical Association. All rights reserved. The codes documented in this report are preliminary and upon coder review may  be revised to meet current compliance requirements. Lucilla Lame MD, MD 12/01/2016 11:45:54 AM This report has been signed electronically. Number of Addenda: 0 Note Initiated On: 12/01/2016 11:27 AM      [facility]

## 2016-12-02 ENCOUNTER — Inpatient Hospital Stay: Payer: 59

## 2016-12-02 DIAGNOSIS — C169 Malignant neoplasm of stomach, unspecified: Secondary | ICD-10-CM

## 2016-12-02 DIAGNOSIS — K56609 Unspecified intestinal obstruction, unspecified as to partial versus complete obstruction: Secondary | ICD-10-CM

## 2016-12-02 DIAGNOSIS — E538 Deficiency of other specified B group vitamins: Secondary | ICD-10-CM

## 2016-12-02 DIAGNOSIS — R1114 Bilious vomiting: Secondary | ICD-10-CM

## 2016-12-02 DIAGNOSIS — K56699 Other intestinal obstruction unspecified as to partial versus complete obstruction: Secondary | ICD-10-CM

## 2016-12-02 NOTE — Discharge Summary (Signed)
Anthony Melendez, is a 36 y.o. male  DOB 1981/01/11  MRN [id number].  Admission date:  11/20/2016  Admitting Physician  Lance Coon, MD  Discharge Date:  12/02/2016   Primary MD  Patient, No Pcp Per  Recommendations for primary care physician for things to follow:  Will be going to [facility] for push enteroscopy and possible jejunal  stent placement    Admission Diagnosis  Fever [R50.9] Sepsis ([place]) [A41.9] Sepsis, due to unspecified organism [facility]) [A41.9]   Discharge Diagnosis  Fever [R50.9] Sepsis ([place]) [A41.9] Sepsis, due to unspecified organism [facility]) [A41.9]   Principal Problem:   Sepsis ([place]) Active Problems:   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 Gastric cancer ([place]) 08/2015   Folfox chemo tx's.  Marland Kitchen GERD (gastroesophageal reflux disease)     Past Surgical History:  Procedure Laterality Date  . ESOPHAGOGASTRODUODENOSCOPY N/A 11/24/2016   Procedure: ESOPHAGOGASTRODUODENOSCOPY (EGD);  Surgeon: Lin Landsman, MD;  Location: [facility] ENDOSCOPY;  Service: Gastroenterology;  Laterality: N/A;  . ESOPHAGOGASTRODUODENOSCOPY (EGD) WITH PROPOFOL N/A 08/02/2015   Procedure: ESOPHAGOGASTRODUODENOSCOPY (EGD) WITH PROPOFOL;  Surgeon: Lucilla Lame, MD;  Location: [place];  Service: Endoscopy;  Laterality: N/A;  . ESOPHAGOGASTRODUODENOSCOPY (EGD) WITH PROPOFOL N/A 02/26/2016   Procedure: ESOPHAGOGASTRODUODENOSCOPY (EGD) WITH PROPOFOL;  Surgeon: Lucilla Lame, MD;  Location: [facility] ENDOSCOPY;  Service: Endoscopy;  Laterality: N/A;  . NO PAST SURGERIES    .  PERIPHERAL VASCULAR CATHETERIZATION N/A 08/13/2015   Procedure: Glori Luis Cath Insertion;  Surgeon: Algernon Huxley, MD;  Location: [place] CV LAB;  Service: Cardiovascular;  Laterality: N/A;       History of present illness and  Hospital Course:     Kindly see H&P for history of present illness and admission details, please review complete Labs, Consult reports and Test reports for all details in brief  HPI  from the history and physical done on the day of admission 36 year old male patient with history of gastric cancer status post chemotherapy admitted for severe abdominal pain, weight loss, anorexia, intractable nausea, vomiting.  Patient has metastatic gastric cancer and had admitted for abdominal pain, anorexia, weight loss, intractable nausea, vomiting.   Hospital Course   #1 intractable nausea, vomiting with metastatic gastric cancer with peritoneal carcinomatosis.  Patient seen by gastroenterology, oncology, patient had a small bowel follow-through yesterday, serial KUB x-rays showed lack of significant transit of barium beyond the proximal jejunum over 16 hours, her findings are compatible with high-grade obstruction in the proximal jejunum likely due to peritoneal carcinomatosis.  Her oncologist Dr. Nolon Stalls spoke with [place] oncology Dr. Ysidro Evert force, gastroenterology Dr. Tillie Rung, because of the high-grade obstruction in the proximal jejunum around ligament of Treitz patient will be transferred to [facility] for possible push enteroscopy and also stent placement.  Discussed the findings with patient's mother and father with the help of Spanish interpreter.  Patient had EGD revealed nonobstructing gastric ulcer and also significant dysmorphic motility.  Patient was given Reglan, scopolamine patch, Decadron, Marinol, Ativan, Phenergan, Carafate, Protonix and also Thorazine for hiccups.  Patient continued to have intractable nausea, vomiting, patient had NG  tube placed by endoscopy guidance yesterday and NG tube is under low  intermittent suction still draining significant amount of bilious fluid.  We discussed the CODE STATUS with parents and also him but they wanted him to be full CODE STATUS.  Throughout the hospital stay patient's family had multiple questions.  We explained that patient has stage IV gastric cancer.  With peritoneal metastatic metastasis.  We tried chemotherapy, he was seen at [facility] for clinical trials before, but patient could not participate in the study because of his sickness with nausea, vomiting.  #2 hypokalemia, hypomagnesemia: Replaced. 3.  Weight loss, failure to thrive secondary to advanced cancer 4.  Community-acquired pneumonia in the left lower lobe on chest x-ray on admission, blood cultures are negative, patient received IV Rocephin.    Discharge Condition: stable   Follow UP      Discharge Instructions  and  Discharge Medications     Allergies as of 12/02/2016      Reactions   Iodine    Possible anaphylaxis per MD Per conversation with pt, pt denies allergy to IV contrast.  Pt injected 09/26/16 without premeds, pt with no reaction.      Medication List    STOP taking these medications   HYDROmorphone 2 MG tablet Commonly known as:  DILAUDID     TAKE these medications   promethazine 25 MG tablet Commonly known as:  PHENERGAN Take 1 tablet (25 mg total) by mouth every 4 (four) hours as needed for nausea or vomiting.      Continue all the medications as per the present MAR, continue continue NG tube to LIS.   Diet and Activity recommendation: See Discharge Instructions above   Consults obtained -oncology, gastroenterology   Major procedures and Radiology Reports - PLEASE review detailed and final reports for all details, in brief -      Dg Abd 1 View  Result Date: 12/02/2016 CLINICAL DATA:  Gastric malignancy with peritoneal carcinomatosis. Suspected proximal small bowel  obstruction. EXAM: ABDOMEN - 1 VIEW COMPARISON:  Small bowel follow-through study of December 01, 2016 as well as supine radiographs of 5:46 p.m. on 29 October and 1:13 a.m. earlier today. FINDINGS: There remains an esophagogastric tube with the tip in the descending duodenum. There is persistent barium within the stomach as well as well as the mildly dilated duodenum and very proximal portion of the jejunum. Marked narrowing of the caliber of the proximal jejunum is observed. No significant transit of the barium beyond that seen on the study of 5:46 pm yesterday is observed. IMPRESSION: No significant further transit of the barium beyond the very proximal 10 cm of the jejunum. 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 Given the lack of significant transit of the barium beyond the proximal jejunum over the past 16 hours, reinstitution of the nasogastric suction may be useful in an effort to evacuate is much of the barium from the stomach and duodenum as possible. Electronically Signed   By: David  Martinique M.D.   On: 12/02/2016 07:20   Dg Abd 1 View  Result Date: 12/02/2016 CLINICAL DATA:  Ileus. EXAM: ABDOMEN - 1 VIEW COMPARISON:  Abdominal radiograph yesterday at 1746 hour FINDINGS: Persistent contrast within the stomach and prominent proximal duodenum. Minimal contrast within nondistended proximal jejunum. No significant progression from prior exam. Enteric tube remains in place. No progression of enteric contrast to distal small bowel. Generalized paucity of bowel gas. IMPRESSION: Contrast within  the stomach and distended duodenum, as well as nondistended proximal jejunum. No significant progression of contrast from prior exam. Electronically Signed   By: Jeb Levering M.D.   On: 12/02/2016 03:25   Dg Abd 1 View  Result Date:  12/01/2016 CLINICAL DATA:  Ileus, small bowel follow-through, 4.5 hours post EXAM: ABDOMEN - 1 VIEW COMPARISON:  Small bowel follow-through dated 12/01/2016 at 1330 hours FINDINGS: Enteric tube terminates in the distal 2nd portion of the duodenum. Contrast within the stomach, duodenum, and proximal jejunum. Duodenum is mildly prominent. Jejunum is not dilated. This has not significantly progressed since the conclusion of the small bowel follow-through. IMPRESSION: Contrast within the stomach, duodenum, and proximal jejunum, not significantly progressed since the conclusion of the small bowel follow-through. Given the nondilated appearance of the proximal jejunum, this appearance remains compatible with adynamic ileus rather than small bowel obstruction. Electronically Signed   By: Julian Hy M.D.   On: 12/01/2016 20:06   Ct Abdomen Pelvis W Contrast  Result Date: 11/22/2016 CLINICAL DATA:  Metastatic gastric cancer with abdominal pain and nausea. EXAM: CT ABDOMEN AND PELVIS WITH CONTRAST TECHNIQUE: Multidetector CT imaging of the abdomen and pelvis was performed using the standard protocol following bolus administration of intravenous contrast. CONTRAST:  140mL ISOVUE-300 IOPAMIDOL (ISOVUE-300) INJECTION 61% COMPARISON:  09/26/2016 FINDINGS: Lower chest: Development of moderate left-sided pleural effusion with adjacent compressive atelectasis. Heart size is normal without pericardial effusion. Hepatobiliary: No focal liver lesions. Gallbladder is physiologically distended. No biliary dilatation. Pancreas: Normal without pancreatic mass, ductal dilatation or inflammation. Spleen: No splenic lesions or splenomegaly. Adrenals/Urinary Tract: Adrenal glands are unremarkable. Kidneys are normal, without renal calculi, focal lesion, or hydronephrosis. Bladder is unremarkable. Stomach/Bowel: The stomach is distended with enteric contrast. Slight anterior gastric wall thickening along the body and antrum.  Scattered colonic diverticulosis without acute diverticulitis. No evidence of bowel obstruction. No free air or portal venous air. Vascular/Lymphatic: No abdominal aortic aneurysm. No gastrohepatic, portal venous, retroperitoneal, mesenteric or pelvic sidewall adenopathy. No inguinal adenopathy. Reproductive: Normal size prostate and seminal vesicles with central zone calcifications of the prostate. Other: Large volume of ascites though decreased in volume is subjectively from 09/26/2016 comparison. Mesenteric edema from the ascites is noted as well as the irregular omental thickening and caking anteriorly as before. Musculoskeletal: No aggressive appearing osseous lesions. Small fat containing umbilical hernia. IMPRESSION: 1. Development of new moderate left pleural effusion with adjacent compressive atelectasis. 2. Slight decrease in large volume of ascites within the abdomen and pelvis. 3. Omental caking and thickening again re- demonstrated with mild anterior gastric wall thickening consistent with patient's history of gastric cancer with metastasis. Electronically Signed   By: Ashley Royalty M.D.   On: 11/22/2016 20:20   US Abdomen Limited  Result Date: 11/10/2016 CLINICAL DATA:  History of metastatic gastric carcinoma with malignant ascites and prior paracentesis procedures. EXAM: LIMITED ABDOMEN ULTRASOUND FOR ASCITES TECHNIQUE: Limited ultrasound survey for ascites was performed in all four abdominal quadrants. COMPARISON:  Recent abdominal ultrasound on 10/28/2016 and most recent paracentesis procedure on 10/21/2016. FINDINGS: Ultrasound performed today shows a small amount of ascites in the peritoneal cavity, appearing even smaller in volume compared to the 10/28/2016 ultrasound study. Paracentesis was therefore not performed. IMPRESSION: Small amount of ascites in the peritoneal cavity. Overall amount appears even smaller compared to the 9/25 study. Paracentesis was not performed today. Electronically  Signed   By: Aletta Edouard M.D.   On: 11/10/2016 11:14   US Venous Img Lower Bilateral  Result Date: 11/30/2016 CLINICAL DATA:  Swelling EXAM: BILATERAL LOWER EXTREMITY VENOUS DOPPLER ULTRASOUND TECHNIQUE: Gray-scale sonography with compression, as well as color and duplex ultrasound, were performed to evaluate the deep venous system from the level of the common femoral vein through the popliteal and proximal calf veins. COMPARISON:  None FINDINGS: Normal compressibility of the common femoral, superficial femoral, and popliteal veins, as well as the proximal calf veins. No filling defects to suggest DVT on grayscale or color Doppler imaging. Doppler waveforms show normal direction of venous flow, normal respiratory phasicity and response to augmentation. IMPRESSION: No evidence of  lower extremity deep vein thrombosis, bilaterally. Electronically Signed   By: Lucrezia Europe M.D.   On: 11/30/2016 14:00   US Paracentesis  Result Date: 11/21/2016 INDICATION: Abdominal discomfort. History of ascites. Request diagnostic and therapeutic paracentesis. EXAM: ULTRASOUND GUIDED RIGHT UPPER QUADRANT PARACENTESIS MEDICATIONS: None. COMPLICATIONS: None immediate. PROCEDURE: Informed written consent was obtained from the patient after a discussion of the risks, benefits and alternatives to treatment. A timeout was performed prior to the initiation of the procedure. Initial ultrasound scanning demonstrates a small to moderate amount of densely loculated ascites within the right upper abdominal quadrant. The right upper abdomen was prepped and draped in the usual sterile fashion. 1% lidocaine with epinephrine was used for local anesthesia. Following this, a Safe-T-Centesis catheter was introduced. An ultrasound image was saved for documentation purposes. The paracentesis was performed. The catheter was removed and a dressing was applied. The patient tolerated the procedure well without immediate post procedural  complication. FINDINGS: A total of approximately 660 mL of hazy, blood-tinged fluid was removed. Samples were sent to the laboratory as requested by the clinical team. IMPRESSION: Densely loculated pocket of ascites in the right upper quadrant. Successful ultrasound-guided paracentesis yielding 660 mL of peritoneal fluid. Read by: Ascencion Dike PA-C Electronically Signed   By: Sandi Mariscal M.D.   On: 11/21/2016 10:32   Dg Chest Port 1 View  Result Date: 11/20/2016 CLINICAL DATA:  Fever. EXAM: PORTABLE CHEST 1 VIEW COMPARISON:  CT chest dated September 04, 2016. FINDINGS: Right chest wall port catheter with tip near the cavoatrial junction. The cardiomediastinal silhouette is borderline enlarged. Low lung volumes. Dense consolidation of the left lower lobe with adjacent small left pleural effusion. No pneumothorax. No acute osseous abnormality. IMPRESSION: Findings concerning for left lower lobe pneumonia with adjacent small pleural effusion. Electronically Signed   By: Titus Dubin M.D.   On: 11/20/2016 21:53   Dg Abd 2 Views  Result Date: 11/09/2016 CLINICAL DATA:  36 year old male with a history of gastric cancer, and ascites now with bilateral flank pain radiating into the lower abdomen EXAM: ABDOMEN - 2 VIEW COMPARISON:  Prior CT scan of the abdomen and pelvis 09/26/2016 FINDINGS: No evidence of free air. Relative paucity of bowel gas. A small amount residual contrast material opacifies the descending colon, sigmoid colon and rectum. The structures are nondilated. Relatively increased density of the abdominal soft tissue suggests underlying ascites. No evidence of nephrolithiasis or other abnormal calcification. Osseous structures are intact and unremarkable. IMPRESSION: 1. No evidence of bowel obstruction. 2. No visible nephrolithiasis. 3. Suspect ascites. Electronically Signed   By: Jacqulynn Cadet M.D.   On: 11/09/2016 09:38   Dg Duanne Limerick W/small Bowel  Result Date: 12/01/2016 CLINICAL DATA:  Gastric  malignancy with peritoneal carcinomatosis NG add dysmotility. Patient underwent placement of a nasogastric tube via endoscopic direction. EXAM: UPPER GI SERIES WITH SMALL BOWEL FOLLOW-THROUGH using 120 cc of  thin barium and 30 cc of water. FLUOROSCOPY TIME:  Fluoroscopy Time:  1 minutes, 30 seconds Radiation Exposure Index (if provided by the fluoroscopic device): 1122 micro Gy per meters square Number of Acquired Spot Images: 8+ multiple overhead images over the course of 2.5 hours. TECHNIQUE: Combined double contrast and single contrast upper GI series using effervescent crystals, thick barium, and thin barium. Subsequently, serial images of the small bowel were obtained including spot views of the terminal ileum. COMPARISON:  Abdominal and pelvic CT scan of November 22, 2016 FINDINGS: The scout film revealed no dilated small or large bowel loops. A small amount of gas was noted within the stomach. The nasogastric tube tip lay at the junction of the second and third portions of the duodenum with the proximal port just distal to the duodenal bulb. A total of 120 cc of barium plus a flush of 30 cc was slowly instilled through the nasogastric tube. There was initial filling of the first and second portions of duodenum with free reflux of across the pylorus into the get stomach cavity. The stomach appeared fairly normal in contour. Barium then passed distally into the third portion of the duodenum and ultimately into the proximal jejunum over the course of 2.5 hours. The appearance of the visualized small bowel loops was normal. Peristalsis was a moderate within the duodenum. The study was terminated with the image obtained at 4 p.m. and serial supine abdominal radiographs will be obtained through the night and in the morning. IMPRESSION: Very slow transit of the barium in the small bowel reaching only the proximal jejunum by 2.5 hours. Evidence of wide patency of the pyloric channel with reflux of the barium into the  stomach. Transient gastroesophageal reflux was observed during the course of the procedure. Additional abdominal radiographs will be obtained throughout the evening and overnight to follow the course of the barium. The findings were discussed by me by telephone with Dr.Chameka Mcmullen at 4:50 p.m. on 29 October, 2018. Electronically Signed   By: David  Martinique M.D.   On: 12/01/2016 16:49   US Abdomen Limited Ruq  Result Date: 11/23/2016 CLINICAL DATA:  RIGHT upper quadrant pain. EXAM: ULTRASOUND ABDOMEN LIMITED RIGHT UPPER QUADRANT COMPARISON:  CT abdomen and pelvis 11/22/2016. Ultrasound-guided paracentesis 11/21/2016. FINDINGS: Gallbladder: No gallstones or wall thickening visualized. No sonographic Murphy sign noted by sonographer. Common bile duct: Diameter: 3.4 mm, normal Liver: No focal lesion identified. Within normal limits in parenchymal echogenicity. Portal vein is patent on color Doppler imaging with normal direction of blood flow towards the liver. Additional findings: Ascites. Compared with 11/21/16 sonography, ascitic fluid in the RIGHT upper quadrant has recurred. IMPRESSION: No gallstones or biliary ductal dilatation. Malignant ascites has recurred in the RIGHT upper quadrant. Electronically Signed   By: Staci Righter M.D.   On: 11/23/2016 09:42    Micro Results    Recent Results (from the past 240 hour(s))  C difficile quick scan w PCR reflex     Status: None   Collection Time: 11/23/16  3:16 PM  Result Value Ref Range Status   C Diff antigen NEGATIVE NEGATIVE Final   C Diff toxin NEGATIVE NEGATIVE Final   C Diff interpretation No C. difficile detected.  Final  Gastrointestinal Panel by PCR , Stool     Status: None   Collection Time: 11/23/16  3:16 PM  Result Value Ref Range Status   Campylobacter species NOT DETECTED NOT DETECTED Final   Plesimonas shigelloides NOT DETECTED NOT DETECTED Final  Salmonella species NOT DETECTED NOT DETECTED Final   Yersinia enterocolitica NOT DETECTED  NOT DETECTED Final   Vibrio species NOT DETECTED NOT DETECTED Final   Vibrio cholerae NOT DETECTED NOT DETECTED Final   Enteroaggregative E coli (EAEC) NOT DETECTED NOT DETECTED Final   Enteropathogenic E coli (EPEC) NOT DETECTED NOT DETECTED Final   Enterotoxigenic E coli (ETEC) NOT DETECTED NOT DETECTED Final   Shiga like toxin producing E coli (STEC) NOT DETECTED NOT DETECTED Final   Shigella/Enteroinvasive E coli (EIEC) NOT DETECTED NOT DETECTED Final   Cryptosporidium NOT DETECTED NOT DETECTED Final   Cyclospora cayetanensis NOT DETECTED NOT DETECTED Final   Entamoeba histolytica NOT DETECTED NOT DETECTED Final   Giardia lamblia NOT DETECTED NOT DETECTED Final   Adenovirus F40/41 NOT DETECTED NOT DETECTED Final   Astrovirus NOT DETECTED NOT DETECTED Final   Norovirus GI/GII NOT DETECTED NOT DETECTED Final   Rotavirus A NOT DETECTED NOT DETECTED Final   Sapovirus (I, II, IV, and V) NOT DETECTED NOT DETECTED Final       Today   Subjective:   Anthony Melendez today , continues to feel miserable with nausea, vomiting.  Objective:   Blood pressure 96/67, pulse 76, temperature 98 F (36.7 C), temperature source Oral, resp. rate 20, height 5\' 5"  (1.651 m), weight 71.3 kg (157 lb 4 oz), SpO2 98 %.   Intake/Output Summary (Last 24 hours) at 12/02/16 0856 Last data filed at 12/02/16 0430  Gross per 24 hour  Intake             1273 ml  Output             1225 ml  Net               48 ml    Exam Awake Alert, Oriented x 3, No new F.N deficits, Normal affect [place].AT,PERRAL Supple Neck,No JVD, No cervical lymphadenopathy appriciated.  Symmetrical Chest wall movement, Good air movement bilaterally, CTAB RRR,No Gallops,Rubs or new Murmurs, No Parasternal Heave +ve B.Sounds, Abd Soft, Non tender, No organomegaly appriciated, No rebound -guarding or rigidity. No Cyanosis, Clubbing or edema, No new Rash or bruise  Data Review   CBC w Diff:  Lab Results  Component Value Date    WBC 9.1 12/01/2016   HGB 10.6 (L) 12/01/2016   HGB 17.5 06/28/2012   HCT 32.4 (L) 12/01/2016   HCT 49.8 06/28/2012   PLT 164 12/01/2016   PLT 206 06/28/2012   LYMPHOPCT 12 10/21/2016   MONOPCT 8 10/21/2016   EOSPCT 1 10/21/2016   BASOPCT 1 10/21/2016    CMP:  Lab Results  Component Value Date   NA 140 11/28/2016   NA 141 06/28/2012   K 3.9 11/28/2016   K 3.4 (L) 06/28/2012   CL 107 11/28/2016   CL 106 06/28/2012   CO2 28 11/28/2016   CO2 27 06/28/2012   BUN 22 (H) 11/28/2016   BUN 12 06/28/2012   CREATININE 0.65 12/01/2016   CREATININE 0.73 06/28/2012   PROT 6.7 11/24/2016   PROT 7.6 06/28/2012   ALBUMIN 2.7 (L) 11/24/2016   ALBUMIN 3.8 06/28/2012   BILITOT 0.7 11/24/2016   BILITOT 0.6 06/28/2012   ALKPHOS 90 11/24/2016   ALKPHOS 77 06/28/2012   AST 22 11/24/2016   AST 45 (H) 06/28/2012   ALT 12 (L) 11/24/2016   ALT 69 06/28/2012  .   Total Time in preparing paper work, data evaluation and todays exam - 41 minutes  Gao Mitnick M.D  on 12/02/2016 at 8:56 AM    Note: This dictation was prepared with Dragon dictation along with smaller phrase technology. Any transcriptional errors that result from this process are unintentional.

## 2016-12-02 NOTE — Progress Notes (Signed)
Anthony Melendez Hematology/Oncology Progress Note  Date of admission: 11/20/2016  Hospital day:  12/02/2016  Chief Complaint: Anthony Melendez is a 36 y.o. male with metastatic gastric cancer who was admitted with abdominal pain, anorexia, and weight loss.  Subjective:  Patient denies any new symptoms.  He wants to eat, but is NPO s/p NG placement.  Abdominal discomfort (controlled). No vomiting s/p NG placement. Singultus.  Social History: The patient is accompanied by his father, mother, and the interpreter today.    Allergies:  Allergies  Allergen Reactions  . Iodine     Possible anaphylaxis per MD Per conversation with pt, pt denies allergy to IV contrast.  Pt injected 09/26/16 without premeds, pt with no reaction.    Scheduled Medications: . calcium carbonate  1 tablet Oral TID WC  . calcium carbonate  1 tablet Oral Once  . dexamethasone  10 mg Intravenous Q12H  . dronabinol  2.5 mg Oral BID AC  . enoxaparin (LOVENOX) injection  40 mg Subcutaneous Q24H  . folic acid  1 mg Oral Daily  . haloperidol lactate  0.5 mg Intravenous Q6H  . nystatin  5 mL Oral QID  . OLANZapine  10 mg Oral QHS  . pantoprazole (PROTONIX) IV  40 mg Intravenous Q24H  . scopolamine  1 patch Transdermal Q72H  . sucralfate  1 g Oral TID WC & HS    Review of Systems: GENERAL:  Fatigue.  No fevers or sweats.  Ongoing weight loss. PERFORMANCE STATUS (ECOG):  3 HEENT:  Mouth sore.  No visual changes, runny nose, sore throat, or tenderness. Lungs: No shortness of breath or cough.  No hemoptysis. Cardiac:  No chest pain, palpitations, orthopnea, or PND. GI:  NPO s/p NG placement yesterday.  Nausea and vomiting, improved.  Diffuse abdominal disomfort.  No bowel movements.  No melena or hematochezia. GU:  No urgency, frequency, dysuria, or hematuria. Musculoskeletal:  Back pain.  No joint pain.  No muscle tenderness. Extremities:  No pain or swelling. Skin:  No rashes or skin  changes. Neuro:  No headache, numbness or weakness, balance or coordination issues. Endocrine:  No diabetes, thyroid issues, hot flashes or night sweats. Psych:  No mood changes, depression or anxiety.  Mother concerned about patient giving up if he knows what is going on. Pain:  Abdominal discomfort. Review of systems:  All other systems reviewed and found to be negative.  Physical Exam: Blood pressure 103/70, pulse 76, temperature (!) 97.5 F (36.4 C), temperature source Oral, resp. rate 18, height _0  (1.651 m), weight 157 lb 4 oz (71.3 kg), SpO2 99 %. GENERAL:  Chronically ill appearing gentleman lying comfortably in bed in no acute distress.  HEAD:  Thin dark hair.  Temporal wasting.  Normocephalic, atraumatic, face symmetric, no Cushingoid features. EYES:  Brown eyes.  Pupils equal round and reactive to light and accomodation.  No conjunctivitis or scleral icterus. ENT:  No oral lesions.  No thrush. Mucous membranes moist. RESPIRATORY:  Clear to auscultation anteriorly without rales, wheezes or rhonchi. CARDIOVASCULAR:  Regular rate and rhythm without murmur, rub or gallop. ABDOMEN:  Soft, tender to palpation.  No guarding or rebound tenderness.  Diffuse abdominal firmess.  Scarce bowel sounds.  No appreciable hepatosplenomegaly.   SKIN:  No rashes, ulcers or lesions. EXTREMITIES: Left foot edema.  No skin discoloration or tenderness.  No palpable cords. NEUROLOGICAL: Appropriate.   Results for orders placed or performed during the hospital encounter of 11/20/16 (from the past  48 hour(s))  Iron and TIBC     Status: Abnormal   Collection Time: 12/01/16  4:18 AM  Result Value Ref Range   Iron 27 (L) 45 - 182 ug/dL   TIBC 215 (L) 250 - 450 ug/dL   Saturation Ratios 13 (L) 17.9 - 39.5 %   UIBC 189 ug/dL  CBC     Status: Abnormal   Collection Time: 12/01/16  4:18 AM  Result Value Ref Range   WBC 9.1 3.8 - 10.6 K/uL   RBC 4.25 (L) 4.40 - 5.90 MIL/uL   Hemoglobin 10.6 (L) 13.0 -  18.0 g/dL   HCT 32.4 (L) 40.0 - 52.0 %   MCV 76.2 (L) 80.0 - 100.0 fL   MCH 24.9 (L) 26.0 - 34.0 pg   MCHC 32.7 32.0 - 36.0 g/dL   RDW 15.9 (H) 11.5 - 14.5 %   Platelets 164 150 - 440 K/uL  Creatinine, serum     Status: None   Collection Time: 12/01/16  4:18 AM  Result Value Ref Range   Creatinine, Ser 0.65 0.61 - 1.24 mg/dL   GFR calc non Af Amer >60 >60 mL/min   GFR calc Af Amer >60 >60 mL/min    Comment: (NOTE) The eGFR has been calculated using the CKD EPI equation. This calculation has not been validated in all clinical situations. eGFR's persistently <60 mL/min signify possible Chronic Kidney Disease.    Dg Abd 1 View  Result Date: 12/02/2016 CLINICAL DATA:  Gastric malignancy with peritoneal carcinomatosis. Suspected proximal small bowel obstruction. EXAM: ABDOMEN - 1 VIEW COMPARISON:  Small bowel follow-through study of December 01, 2016 as well as supine radiographs of 5:46 p.m. on 29 October and 1:13 a.m. earlier today. FINDINGS: There remains an esophagogastric tube with the tip in the descending duodenum. There is persistent barium within the stomach as well as well as the mildly dilated duodenum and very proximal portion of the jejunum. Marked narrowing of the caliber of the proximal jejunum is observed. No significant transit of the barium beyond that seen on the study of 5:46 pm yesterday is observed. IMPRESSION: No significant further transit of the barium beyond the very proximal 10 cm of the jejunum. 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 Given the lack of significant transit of the barium beyond the proximal jejunum over the past 16 hours, reinstitution of the nasogastric suction may be useful in an effort to evacuate is much of the barium from the stomach and duodenum as possible.  Electronically Signed   By: David  Martinique M.D.   On: 12/02/2016 07:20   Dg Abd 1 View  Result Date: 12/02/2016 CLINICAL DATA:  Ileus. EXAM: ABDOMEN - 1 VIEW COMPARISON:  Abdominal radiograph yesterday at 1746 hour FINDINGS: Persistent contrast within the stomach and prominent proximal duodenum. Minimal contrast within nondistended proximal jejunum. No significant progression from prior exam. Enteric tube remains in place. No progression of enteric contrast to distal small bowel. Generalized paucity of bowel gas. IMPRESSION: Contrast within the stomach and distended duodenum, as well as nondistended proximal jejunum. No significant progression of contrast from prior exam. Electronically Signed   By: Jeb Levering M.D.   On: 12/02/2016 03:25   Dg Abd 1 View  Result Date: 12/01/2016 CLINICAL DATA:  Ileus, small bowel follow-through, 4.5 hours post EXAM: ABDOMEN - 1 VIEW COMPARISON:  Small bowel follow-through  dated 12/01/2016 at 1330 hours FINDINGS: Enteric tube terminates in the distal 2nd portion of the duodenum. Contrast within the stomach, duodenum, and proximal jejunum. Duodenum is mildly prominent. Jejunum is not dilated. This has not significantly progressed since the conclusion of the small bowel follow-through. IMPRESSION: Contrast within the stomach, duodenum, and proximal jejunum, not significantly progressed since the conclusion of the small bowel follow-through. Given the nondilated appearance of the proximal jejunum, this appearance remains compatible with adynamic ileus rather than small bowel obstruction. Electronically Signed   By: Julian Hy M.D.   On: 12/01/2016 20:06   Dg Duanne Limerick W/small Bowel  Result Date: 12/01/2016 CLINICAL DATA:  Gastric malignancy with peritoneal carcinomatosis NG add dysmotility. Patient underwent placement of a nasogastric tube via endoscopic direction. EXAM: UPPER GI SERIES WITH SMALL BOWEL FOLLOW-THROUGH using 120 cc of thin barium and 30 cc of water.  FLUOROSCOPY TIME:  Fluoroscopy Time:  1 minutes, 30 seconds Radiation Exposure Index (if provided by the fluoroscopic device): 1122 micro Gy per meters square Number of Acquired Spot Images: 8+ multiple overhead images over the course of 2.5 hours. TECHNIQUE: Combined double contrast and single contrast upper GI series using effervescent crystals, thick barium, and thin barium. Subsequently, serial images of the small bowel were obtained including spot views of the terminal ileum. COMPARISON:  Abdominal and pelvic CT scan of November 22, 2016 FINDINGS: The scout film revealed no dilated small or large bowel loops. A small amount of gas was noted within the stomach. The nasogastric tube tip lay at the junction of the second and third portions of the duodenum with the proximal port just distal to the duodenal bulb. A total of 120 cc of barium plus a flush of 30 cc was slowly instilled through the nasogastric tube. There was initial filling of the first and second portions of duodenum with free reflux of across the pylorus into the get stomach cavity. The stomach appeared fairly normal in contour. Barium then passed distally into the third portion of the duodenum and ultimately into the proximal jejunum over the course of 2.5 hours. The appearance of the visualized small bowel loops was normal. Peristalsis was a moderate within the duodenum. The study was terminated with the image obtained at 4 p.m. and serial supine abdominal radiographs will be obtained through the night and in the morning. IMPRESSION: Very slow transit of the barium in the small bowel reaching only the proximal jejunum by 2.5 hours. Evidence of wide patency of the pyloric channel with reflux of the barium into the stomach. Transient gastroesophageal reflux was observed during the course of the procedure. Additional abdominal radiographs will be obtained throughout the evening and overnight to follow the course of the barium. The findings were  discussed by me by telephone with Dr.Konidena at 4:50 p.m. on 29 October, 2018. Electronically Signed   By: David  Martinique M.D.   On: 12/01/2016 16:49    Assessment:  Anthony Melendez is a 36 y.o. male with stage IV gastric cancer who was admitted with chronic nausea, vomiting, decreased oral intake, and weight loss.  He initially presented in 07/2015.  Disease has progressed following FOLFOX (08/14/2015 - 02/05/2016), ramucirumab + Taxol (05/01/2016 - 08/20/2016), and pembrolizumab (09/09/2016 - 09/30/2016).  Until recently, he has required large volume paracentesis secondary to malignant ascites.  He has been seen at [facility] for clinical trial enrollment ([id number]).  Treatment postponed from 10/15 to 11/25/2016 secondary to ER visit at [facility] with abdominal pain, nausea and vomiting.  EGD on 11/24/2016 revealed one non-obstructing oozing cratered gastric ulcer of significant severity along the greater curvature of the stomach. There was no evidence of perforation. Findings were consistent with gastric carcinoma, size is increased compared to EGD from 02/2016.  There was a large amount of bilious fluid in the gastric fundus, gastric body, middle and lower third of the esophagus.  Findings were highly concerning for significant dysmotility of the GI tract explaining patient's symptoms of intractable hiccups, nausea and vomiting.  UGI with SBFT  On 12/01/2016 revealed no significant transit of the barium beyond the very proximal 10 cm of the jejunum after 14 hours. 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  Plan: 1.  Oncology:  Metastatic gastric cancer with peritoneal carcinomatosis.  Spoke with Dr. Donalynn Furlong at [facility].  Last day for initiation of treatment on trial was 11/26/2016.  He is off study.  There are no other  clinical trials.  He is a candidate for palliative care/Hospice given his performance status.  If he would improve, could try an irinotecan based regimen (FOLFIRI).  Spoke at length on 11/28/2016 with patient's father both in and out of the room with the assistance of the interpreter yesterday.  Patient has no medical power of attorney if he can not speak for himself.  Discussed code status.  Patient's father said he would talk to his wife and the rest of the family.  He is hesistant about talking to the patient as he does not want to take his hope away.    Spoke with the patient's parents and multiple family members outside of the room on 11/29/2016.  They had numerous questions.  It was reinforced that he has terminal (stage IV) disease.  He has gone through 3 standard treatment regimens and then attempted enrollment in a clinical trial at [facility].  He has continued to decline in health.  I discussed supportive care.  It is unclear if he will improve to allow any further treatment.  I have reached out to [facility] and am waiting a call back.  Spoke today with the patient's parents outside of room per their request on 11/30/2016.  Discussed my conversation with [facility] (medical oncology- Dr. Hazle Nordmann and gastroenterology- Dr. Tillie Rung).  He is not eligible for a clinical trial unless his performance status improves (0-1), "he has to walk into clinic and take care of himself".  GI recommended Carafate.  They also recommended NG tube placement for short term comfort and prior to UGI with SBFT to rule out an isolated small bowel obstruction.  If he has one obstruction, he would be a candidate for push enteroscopy and possible stent placement.  He would then be transferred to the general medicine service at [facility].  He would not be a candidate if he has multi-focal obstruction.  I discussed the risk versus benefit of the procedure.  Parents agree to proceeding.  Will arrange with radiology in AM.  Spoke to the  patient and his parents today about the results from the UGI with SBFT.  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  He appears to be a candidate for push enteroscopy and possible stent placement per GI at [facility].  Discus transfer to [facility] ([place] with Dr. Durenda Age).   I discussed that it is unclear if he  has further lesions downstream.  Discussed CT scan shows abnormal bowel wall/thickening.  They are aware that this is his only option.  If the obstruction can not be relieved or he has further obstruction downstream, he would be a candidate for Hospice.  2.  Gastroenterology:  EGD reveals an enlarging non-obstructive gastric ulcer.  Patient has significant dysmotility.  Trial of Reglan has been unsuccessful. He has had no significant nausea relief with ondansetron and compazine.  Discussed possible erythromycin (shortage per Dr. Tressia Miners). Patient on Decadron 10 mg BID, Marinol, Ativan, Zyprexa, and scopolamine patch.  Patient on thorazine for singultus.  Patient is not a candidate for drainage gastrostomy tube secondary to ascites and gastric malignancy.  Discussed with Dr Marius Ditch, gastroenterologist.  There was no apparent obstruction.  She easily went into the 2nd part of the duodenum.  A stent was initially felt not be helpful.    UGI with SBFT results today revealed a high grade obstruction at the ligament of Treitz.  He is being transferred to [facility] for push enteroscopy and possible stent placement.  TPN is not a viable option (unless obstruction can be relieved and used as a bridge in anticipation of/during treatment) given his advancing malignancy.    Patient with documented folate deficiency.  Patient on folic acid (started on 11/30/2016).  B12 is normal.   Await results from B1.   3.  Hematology:  Discussed with the patient's family the importance of his Lovenox for DVT prophylaxis.  They declined administration of Lovenox over on  12/30/2016.  He developed left ower extremity edema.  Bilateral lower extremity duplex on 12/01/2016 revealed no evidence of DVT.  4.  Disposition:  Patient to be transferred to general medicine service at [facility].  Patient has remained full code.  If his obstruction cannot be relieved, his code status will need re-addressed given his stage IV gastric cancer.  Palliative care has been involved.    Lequita Asal, MD  12/02/2016, 2:20 PM

## 2016-12-03 ENCOUNTER — Encounter: Payer: Self-pay | Admitting: Gastroenterology

## 2016-12-03 NOTE — Progress Notes (Signed)
[place] [facility] [place] NAME: Anthony Melendez    MR#:  [id number]  DATE OF BIRTH:  1980-07-22 Seen at bedside, still waiting for bed at [facility].  Patient has been accepted at [facility] but they are waiting for bed.  Patient says that he has less nausea, less abdominal pain.  Eating little bites of food.   CHIEF COMPLAINT:   Chief Complaint  Patient presents with  . Abdominal Pain   Spanish interpreter.  Continues to have nausea and vomiting.  Feels weak.  Afebrile.  REVIEW OF SYSTEMS:  Review of Systems  Constitutional: Positive for malaise/fatigue and weight loss. Negative for chills and fever.  HENT: Negative for congestion, ear discharge, hearing loss and nosebleeds.   Eyes: Negative for blurred vision and double vision.  Respiratory: Negative for cough, shortness of breath and wheezing.   Cardiovascular: Negative for chest pain, palpitations and leg swelling.  Gastrointestinal: Positive for abdominal pain, nausea and vomiting. Negative for constipation and diarrhea.  Genitourinary: Negative for dysuria and urgency.  Musculoskeletal: Positive for back pain. Negative for myalgias.  Neurological: Negative for dizziness, seizures and headaches.  Psychiatric/Behavioral: Negative for depression.    DRUG ALLERGIES:   Allergies  Allergen Reactions  . Iodine     Possible anaphylaxis per MD Per conversation with pt, pt denies allergy to IV contrast.  Pt injected 09/26/16 without premeds, pt with no reaction.    VITALS:  Blood pressure 96/67, pulse 62, temperature 98.4 F (36.9 C), temperature source Oral, resp. rate 15, height 5\' 5"  (1.651 m), weight 71.3 kg (157 lb 4 oz), SpO2 98 %.  PHYSICAL EXAMINATION:  Physical Exam  GENERAL:  36 y.o.-year-old ill-nourished patient lying in the bed, sleeping,seen in EYES: Pupils equal, round, reactive to light and accommodation. No scleral icterus. Extraocular muscles intact. HEENT: Head atraumatic,  normocephalic. Oropharynx and nasopharynx clear. NECK:  Supple, no jugular venous distention. No thyroid enlargement, no tenderness.  LUNGS: Normal breath sounds bilaterally, no wheezing, rales,rhonchi or crepitation. No use of accessory muscles of respiration. Decreased bibasilar breath sounds. CARDIOVASCULAR: S1, S2 normal. No murmurs, rubs, or gallops.  ABDOMEN: Soft, nontender but discomfort on palpation in left and right upper quadrant, distended and tight/firm. Hypoactive Bowel sounds present. No organomegaly or mass.  EXTREMITIES: Lower extremity edema NEUROLOGIC: Moves all 4 extremities PSYCHIATRIC: The patient is awake and alert. SKIN: No obvious rash, lesion, or ulcer.    LABORATORY PANEL:   CBC  Recent Labs Lab 12/01/16 0418  WBC 9.1  HGB 10.6*  HCT 32.4*  PLT 164   ------------------------------------------------------------------------------------------------------------------  Chemistries   Recent Labs Lab 11/28/16 0545 12/01/16 0418  NA 140  --   K 3.9  --   CL 107  --   CO2 28  --   GLUCOSE 154*  --   BUN 22*  --   CREATININE 0.64 0.65  CALCIUM 8.1*  --    ------------------------------------------------------------------------------------------------------------------  Cardiac Enzymes No results for input(s): TROPONINI in the last 168 hours. ------------------------------------------------------------------------------------------------------------------  RADIOLOGY:  Dg Abd 1 View  Result Date: 12/02/2016 CLINICAL DATA:  Gastric malignancy with peritoneal carcinomatosis. Suspected proximal small bowel obstruction. EXAM: ABDOMEN - 1 VIEW COMPARISON:  Small bowel follow-through study of December 01, 2016 as well as supine radiographs of 5:46 p.m. on 29 October and 1:13 a.m. earlier today. FINDINGS: There remains an esophagogastric tube with the tip in the descending duodenum. There is persistent barium within the stomach as well as well as  the mildly  dilated duodenum and very proximal portion of the jejunum. Marked narrowing of the caliber of the proximal jejunum is observed. No significant transit of the barium beyond that seen on the study of 5:46 pm yesterday is observed. IMPRESSION: No significant further transit of the barium beyond the very proximal 10 cm of the jejunum. 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 Given the lack of significant transit of the barium beyond the proximal jejunum over the past 16 hours, reinstitution of the nasogastric suction may be useful in an effort to evacuate is much of the barium from the stomach and duodenum as possible. Electronically Signed   By: David  Martinique M.D.   On: 12/02/2016 07:20   Dg Abd 1 View  Result Date: 12/02/2016 CLINICAL DATA:  Ileus. EXAM: ABDOMEN - 1 VIEW COMPARISON:  Abdominal radiograph yesterday at 1746 hour FINDINGS: Persistent contrast within the stomach and prominent proximal duodenum. Minimal contrast within nondistended proximal jejunum. No significant progression from prior exam. Enteric tube remains in place. No progression of enteric contrast to distal small bowel. Generalized paucity of bowel gas. IMPRESSION: Contrast within the stomach and distended duodenum, as well as nondistended proximal jejunum. No significant progression of contrast from prior exam. Electronically Signed   By: Jeb Levering M.D.   On: 12/02/2016 03:25   Dg Abd 1 View  Result Date: 12/01/2016 CLINICAL DATA:  Ileus, small bowel follow-through, 4.5 hours post EXAM: ABDOMEN - 1 VIEW COMPARISON:  Small bowel follow-through dated 12/01/2016 at 1330 hours FINDINGS: Enteric tube terminates in the distal 2nd portion of the duodenum. Contrast within the stomach, duodenum, and proximal jejunum. Duodenum is mildly prominent. Jejunum is  not dilated. This has not significantly progressed since the conclusion of the small bowel follow-through. IMPRESSION: Contrast within the stomach, duodenum, and proximal jejunum, not significantly progressed since the conclusion of the small bowel follow-through. Given the nondilated appearance of the proximal jejunum, this appearance remains compatible with adynamic ileus rather than small bowel obstruction. Electronically Signed   By: Julian Hy M.D.   On: 12/01/2016 20:06   Dg Duanne Limerick W/small Bowel  Result Date: 12/01/2016 CLINICAL DATA:  Gastric malignancy with peritoneal carcinomatosis NG add dysmotility. Patient underwent placement of a nasogastric tube via endoscopic direction. EXAM: UPPER GI SERIES WITH SMALL BOWEL FOLLOW-THROUGH using 120 cc of thin barium and 30 cc of water. FLUOROSCOPY TIME:  Fluoroscopy Time:  1 minutes, 30 seconds Radiation Exposure Index (if provided by the fluoroscopic device): 1122 micro Gy per meters square Number of Acquired Spot Images: 8+ multiple overhead images over the course of 2.5 hours. TECHNIQUE: Combined double contrast and single contrast upper GI series using effervescent crystals, thick barium, and thin barium. Subsequently, serial images of the small bowel were obtained including spot views of the terminal ileum. COMPARISON:  Abdominal and pelvic CT scan of November 22, 2016 FINDINGS: The scout film revealed no dilated small or large bowel loops. A small amount of gas was noted within the stomach. The nasogastric tube tip lay at the junction of the second and third portions of the duodenum with the proximal port just distal to the duodenal bulb. A total of 120 cc of barium plus a flush of 30 cc was slowly instilled through the nasogastric tube. There was initial filling of the first and second portions of duodenum with free reflux of across  the pylorus into the get stomach cavity. The stomach appeared fairly normal in contour. Barium then passed distally into  the third portion of the duodenum and ultimately into the proximal jejunum over the course of 2.5 hours. The appearance of the visualized small bowel loops was normal. Peristalsis was a moderate within the duodenum. The study was terminated with the image obtained at 4 p.m. and serial supine abdominal radiographs will be obtained through the night and in the morning. IMPRESSION: Very slow transit of the barium in the small bowel reaching only the proximal jejunum by 2.5 hours. Evidence of wide patency of the pyloric channel with reflux of the barium into the stomach. Transient gastroesophageal reflux was observed during the course of the procedure. Additional abdominal radiographs will be obtained throughout the evening and overnight to follow the course of the barium. The findings were discussed by me by telephone with Dr.Eion Timbrook at 4:50 p.m. on 29 October, 2018. Electronically Signed   By: David  Martinique M.D.   On: 12/01/2016 16:49    EKG:   Orders placed or performed during the hospital encounter of 11/20/16  . EKG 12-Lead  . EKG 12-Lead  . EKG 12-Lead  . EKG 12-Lead    ASSESSMENT AND PLAN:   36 year old male with past medical history significant for metastatic gastric cancer status post large volume paracentesis presents to hospital secondary to abdominal pain and nausea.  # Community-acquired pneumonia-left lower lobe infiltrate noted on chest x-ray. - Blood cultures are negative. Currently on zosyn. Stopped after 7 days -   # GI tract dysmotility- causing abdominal pain and nausea-has had intermittent abdominal pain associated with nausea secondary to his underlying malignancy. -Prior large-volume paracentesis, however paracentesis attempted to 3 weeks ago at [facility] emergency room and no fluid could be removed. This admission only 600 cc removed - -GI consult appreciated, s/p upper GI endoscopy on 11/24/16 - showing increased ulcerated gastric cancer in stomach, significant GI tract  dysmotility Status post EGD, and NG-tube placement today Patient has  high-grade obstruction around ligament of Treitz, discharge summary was done yesterday, waiting for bed availability at [facility].  Discussed this with patient's family.  Continue NG tube to suctioning as needed for symptom improvement, continue Ensure.  -On Zyprexa, Haldol, Decadron, Marinol. 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 Unfortunately no treatment options available per oncology.  D#Lower extremity edema.  Will check venous Dopplers to rule out DVT.  # DVT prophylaxis - lovenox   CODE STATUS: Full code  TOTAL TIME TAKING CARE OF THIS PATIENT: 25  minutes.    Epifanio Lesches M.D on 12/03/2016 at 9:09 AM  Between 7am to 6pm - Pager - [phone]  After 6pm go to [email or web address] - password [email or web address] [facility]  Office  [phone]  CC: Primary care physician; Patient, No Pcp Per

## 2016-12-03 NOTE — Progress Notes (Addendum)
Nutrition Follow-up  DOCUMENTATION CODES:   Severe malnutrition in context of acute illness/injury  INTERVENTION:  Recommend placement of PICC for initiation of TPN. Patient has now been almost 3 weeks without nutrition. He will have poor outcome of interventions at [facility] if nutrition status is not addressed. Will not be able to use port because once TPN is infused through the line, it will not be able to be used for anything else. New central access needed.  Once central line placed and confirmed, recommend initiating Clinimix E 5/20 at 40 mL/hr over 24 hours + 20% ILE at 20 mL/hr over 12 hours. Recommend monitoring CBGs Q6hrs, triglycerides, BMP, magnesium, and phosphorus. After 24 hours if CBGs, electrolytes, and triglycerides are within acceptable range, can advance to goal regimen of Clinimix E 5/20 at 83 mL/hr over 24 hrs + 20% ILE at 20 mL/hr over 12 hours. Goal regimen provides 2233 kcal, 99.6 grams of protein, 1992 mL fluid daily (GIR 4.23 mg/kg/hr, 0.73 grams lipids/kg, 0.06 grams lipids/kg/minute infusion rate).  Monitor magnesium, potassium, and phosphorus daily for at least 3 days, MD to replete as needed, as pt is at risk for refeeding syndrome given severe malnutrition and no intake for 3 weeks.  Recommend providing thiamine 100 mg daily in TPN in setting of refeeding risk. Can also provide folic acid 1 mg daily in TPN to help correct deficiency since patient cannot tolerate po form.  NUTRITION DIAGNOSIS:   Severe Malnutrition related to acute illness (high-grade obstruction in proximal jejunum, known peritoneal carcinomatosis) as evidenced by 19.7 percent weight loss over 2 months, energy intake < or equal to 50% for > or equal to 5 days, moderate fat depletion, moderate muscle depletion.  New nutrition diagnosis.  GOAL:   Patient will meet greater than or equal to 90% of their needs  Not met. Not able to address without TPN as patient is unable to tolerate any PO  intake.  MONITOR:   PO intake, Labs, Weight trends, I & O's  REASON FOR ASSESSMENT:   Malnutrition Screening Tool    ASSESSMENT:   Anthony Melendez is a 36 yo male with PMH of STG IV gastric cancer with malignant ascites and peritoneal carcinomatosis, transitioned from standard chemo to trial chemo at [facility]. Presents with abd pain, nausea,vomiting, weight loss, HCAP. Underwent paracentesis today, 646m fluid pulled.   -On 9/29 an NGT was placed so patient would be able to receive barium needed for Upper GI series with small bowel follow-through. Tube of tip actually terminated at junction of second and third portions of duodenum. There was very slow transit of barium in small bowel (took 2.5 hours to reach proximal jejunum), and there was reflux of the barium into the stomach. At the end of study, barium had not traveled past the proximal jejunum after 16 hours. Findings compatible with high-grade obstruction in the proximal jejunum likely due to known peritoneal carcinomatosis. -[facility] was written yesterday morning. However, there is still no bed available.  Met with patient and his family members at bedside. Interpreter was unable to be present due to busy schedule. Brought video interpreter into room, but there was a family member present who spoke [place] family preferred to communicate that way. Patient agreeable. Patient reports he cannot tolerate any of the clear liquids in setting of his nausea and discomfort. His NGT is placed to suction and there was a large amount of output at time of RD assessment. Family  confirms it has now been 3 weeks since patient was able to eat anything. They report he has continued to lose weight and weight loss is starting to become noticeable. Patient agreed to another physical exam.  Meal Completion: 0%  Patient has 16 Fr. NGT in left nare placed 10/29. Currently to LIS.  Current IV access is  implanted port to right chest.  Medications reviewed and include: calcium carbonate TID po (patient refusing), Decadron 10 mg Q12hrs IV, Marinol 2.5 mg BID po (patient refusing), folic acid 1 mg po (patient refusing), nystatin 5 mL QID, pantoprazole IV, Carafate 1 gram TID and QHS po (patient refusing), NS with KCl 20 mEq/L @ 60 mL/hr.  Labs reviewed: No chem profile since 10/26. Folate 2.3 on 10/28. He has been unable to tolerate his oral folic acid.  Output from NGT not documented in chart at this time. Did not look at volume present during assessment but entire canister was full.  Weight trend: RD obtained bed scale weight of 144.1 lbs. Patient has now lost 35.5 lbs (19.7% body weight) over the past 2 months, which is significant for time frame.  Discussed with RN.  NUTRITION - FOCUSED PHYSICAL EXAM:    Most Recent Value  Orbital Region  Moderate depletion  Upper Arm Region  Moderate depletion  Thoracic and Lumbar Region  Mild depletion  Buccal Region  Moderate depletion  Temple Region  Moderate depletion  Clavicle Bone Region  Mild depletion  Clavicle and Acromion Bone Region  Mild depletion  Scapular Bone Region  Unable to assess  Dorsal Hand  Moderate depletion  Patellar Region  No depletion  Anterior Thigh Region  No depletion  Posterior Calf Region  No depletion  Edema (RD Assessment)  Mild [hands]  Hair  Reviewed  Eyes  Unable to assess  Mouth  Unable to assess  Skin  Reviewed  Nails  Reviewed     Diet Order:  Diet clear liquid Room service appropriate? Yes; Fluid consistency: Thin  EDUCATION NEEDS:   No education needs have been identified at this time  Skin:  Skin Assessment: Reviewed RN Assessment (rash to chest)  Last BM:  12/02/2016 - small type 5  Height:   Ht Readings from Last 1 Encounters:  11/20/16 '5\' 5"'$  (1.651 m)    Weight:   Wt Readings from Last 1 Encounters:  12/03/16 144 lb 1.6 oz (65.4 kg)    Ideal Body Weight:  61.81 kg  BMI:  Body  mass index is 23.98 kg/m.  Estimated Nutritional Needs:   Kcal:  1990-2290 (MSJ x 1.3-1.5)  Protein:  98-111 grams (1.5-1.7 grams/kg)  Fluid:  2-2.2 L/day (30-35 mL/kg)  Willey Blade, MS, RD, LDN Office: [phone] Pager: [phone] After Hours/Weekend Pager: [phone]

## 2016-12-04 LAB — VITAMIN B1: Vitamin B1 (Thiamine): 64.1 nmol/L — ABNORMAL LOW (ref 66.5–200.0)

## 2016-12-04 MED ORDER — THIAMINE HCL 100 MG/ML IJ SOLN
INTRAMUSCULAR | Status: DC
  Filled 2016-12-04: qty 960

## 2016-12-04 MED ORDER — FAT EMULSION 20 % IV EMUL
250.0000 mL | INTRAVENOUS | Status: DC
  Filled 2016-12-04: qty 250

## 2016-12-04 NOTE — Discharge Summary (Signed)
Anthony Melendez, is a 36 y.o. male  DOB [birth year]  MRN [id number].  Admission date:  11/20/2016  Admitting Physician  Lance Coon, MD  Discharge Date:  12/04/2016   Primary MD  Patient, No Pcp Per  Recommendations for primary care physician for things to follow:  Will be going to [facility] for push enteroscopy and possible jejunal  stent placement    Admission Diagnosis  Fever [R50.9] Sepsis ([place]) [A41.9] Sepsis, due to unspecified organism [facility]) [A41.9]   Discharge Diagnosis  Fever [R50.9] Sepsis ([place]) [A41.9] Sepsis, due to unspecified organism [facility]) [A41.9]   Principal Problem:   Sepsis ([place]) Active Problems:   Malignant neoplasm of body of stomach (HCC)   Chronic peptic ulcer of stomach   Malignant ascites   HCAP (healthcare-associated pneumonia)   Intractable nausea and vomiting   Loss of weight   Bilious vomiting with nausea   Swelling   Bilious emesis   Small bowel obstruction ([place])      Past Medical History:  Diagnosis Date  . Gastric cancer ([place]) 08/2015   Folfox chemo tx's.  Marland Kitchen GERD (gastroesophageal reflux disease)     Past Surgical History:  Procedure Laterality Date  . ESOPHAGOGASTRODUODENOSCOPY N/A 11/24/2016   Procedure: ESOPHAGOGASTRODUODENOSCOPY (EGD);  Surgeon: Lin Landsman, MD;  Location: [facility] ENDOSCOPY;  Service: Gastroenterology;  Laterality: N/A;  . ESOPHAGOGASTRODUODENOSCOPY (EGD) WITH PROPOFOL N/A 08/02/2015   Procedure: ESOPHAGOGASTRODUODENOSCOPY (EGD) WITH PROPOFOL;  Surgeon: Lucilla Lame, MD;  Location: [place];  Service: Endoscopy;  Laterality: N/A;  . ESOPHAGOGASTRODUODENOSCOPY (EGD) WITH PROPOFOL N/A 02/26/2016   Procedure: ESOPHAGOGASTRODUODENOSCOPY (EGD) WITH PROPOFOL;  Surgeon: Lucilla Lame, MD;  Location: [facility] ENDOSCOPY;  Service: Endoscopy;   Laterality: N/A;  . ESOPHAGOGASTRODUODENOSCOPY (EGD) WITH PROPOFOL N/A 12/01/2016   Procedure: ESOPHAGOGASTRODUODENOSCOPY (EGD) WITH PROPOFOL;  Surgeon: Lucilla Lame, MD;  Location: [facility] ENDOSCOPY;  Service: Endoscopy;  Laterality: N/A;  . NO PAST SURGERIES    . PERIPHERAL VASCULAR CATHETERIZATION N/A 08/13/2015   Procedure: Glori Luis Cath Insertion;  Surgeon: Algernon Huxley, MD;  Location: [place] CV LAB;  Service: Cardiovascular;  Laterality: N/A;       History of present illness and  Hospital Course:     Kindly see H&P for history of present illness and admission details, please review complete Labs, Consult reports and Test reports for all details in brief  HPI  from the history and physical done on the day of admission 36 year old male patient with history of gastric cancer status post chemotherapy admitted for severe abdominal pain, weight loss, anorexia, intractable nausea, vomiting.  Patient has metastatic gastric cancer and had admitted for abdominal pain, anorexia, weight loss, intractable nausea, vomiting.   Hospital Course   #1 intractable nausea, vomiting with metastatic gastric cancer with peritoneal carcinomatosis.  Patient seen by gastroenterology, oncology, patient had a small bowel follow-through yesterday, serial KUB x-rays showed lack of significant transit of barium beyond the proximal jejunum over 16 hours, her findings are compatible with high-grade obstruction in the proximal jejunum likely due to peritoneal carcinomatosis.  Her oncologist Dr. Nolon Stalls spoke with [place] oncology Dr. Ysidro Evert force, gastroenterology Dr. Tillie Rung, because of the high-grade obstruction in the proximal jejunum around ligament of Treitz patient will be transferred to [facility] for possible push enteroscopy and also stent placement.  Discussed the findings with patient's mother and father with the help of Spanish interpreter.  Patient had EGD revealed nonobstructing  gastric ulcer and also significant dysmorphic motility.  Patient was given Reglan, scopolamine patch, Decadron, Marinol, Ativan, Phenergan, Carafate, Protonix and also Thorazine for hiccups.  Patient continued to have intractable nausea, vomiting, patient had NG tube placed by endoscopy guidance yesterday and NG tube is under low intermittent suction still draining significant amount of bilious fluid.  We discussed the CODE STATUS with parents and also him but they wanted him to be full CODE STATUS.  Throughout the hospital stay patient's family had multiple questions.  We explained that patient has stage IV gastric cancer.  With peritoneal metastatic metastasis.  We tried chemotherapy, he was seen at [facility] for clinical trials before, but patient could not participate in the study because of his sickness with nausea, vomiting.  #2 hypokalemia, hypomagnesemia: Replaced. 3.  Weight loss, failure to thrive secondary to advanced cancer 4.  Community-acquired pneumonia in the left lower lobe on chest x-ray on admission, blood cultures are negative, patient received IV Rocephin.    Discharge Condition: stable   Follow UP      Discharge Instructions  and  Discharge Medications     Allergies as of 12/04/2016      Reactions   Iodine    Possible anaphylaxis per MD Per conversation with pt, pt denies allergy to IV contrast.  Pt injected 09/26/16 without premeds, pt with no reaction.      Medication List    STOP taking these medications   HYDROmorphone 2 MG tablet Commonly known as:  DILAUDID     TAKE these medications   promethazine 25 MG tablet Commonly known as:  PHENERGAN Take 1 tablet (25 mg total) by mouth every 4 (four) hours as needed for nausea or vomiting.      Continue all the medications as per the present MAR, continue continue NG tube to LIS.   Diet and Activity recommendation: See Discharge Instructions above   Consults obtained -oncology, gastroenterology   Major  procedures and Radiology Reports - PLEASE review detailed and final reports for all details, in brief -      Dg Abd 1 View  Result Date: 12/02/2016 CLINICAL DATA:  Gastric malignancy with peritoneal carcinomatosis. Suspected proximal small bowel obstruction. EXAM: ABDOMEN - 1 VIEW COMPARISON:  Small bowel follow-through study of December 01, 2016 as well as supine radiographs of 5:46 p.m. on 29 October and 1:13 a.m. earlier today. FINDINGS: There remains an esophagogastric tube with the tip in the descending duodenum. There is persistent barium within the stomach as well as well as the mildly dilated duodenum and very proximal portion of the jejunum. Marked narrowing of the caliber of the proximal jejunum is observed. No significant transit of the barium beyond that seen on the study of 5:46 pm yesterday is observed. IMPRESSION: No significant further transit of the barium beyond the very proximal 10 cm of the jejunum. 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 Given the lack of significant transit of the barium beyond the proximal jejunum over the past 16 hours, reinstitution of the nasogastric suction may be useful in an effort to evacuate is much of the barium from the stomach and duodenum as possible. Electronically Signed   By: David  Martinique M.D.   On: 12/02/2016 07:20   Dg Abd 1 View  Result Date: 12/02/2016 CLINICAL DATA:  Ileus. EXAM: ABDOMEN - 1 VIEW COMPARISON:  Abdominal radiograph yesterday at 1746 hour FINDINGS: Persistent contrast within  the stomach and prominent proximal duodenum. Minimal contrast within nondistended proximal jejunum. No significant progression from prior exam. Enteric tube remains in place. No progression of enteric contrast to distal small bowel. Generalized paucity of bowel gas. IMPRESSION:  Contrast within the stomach and distended duodenum, as well as nondistended proximal jejunum. No significant progression of contrast from prior exam. Electronically Signed   By: Jeb Levering M.D.   On: 12/02/2016 03:25   Dg Abd 1 View  Result Date: 12/01/2016 CLINICAL DATA:  Ileus, small bowel follow-through, 4.5 hours post EXAM: ABDOMEN - 1 VIEW COMPARISON:  Small bowel follow-through dated 12/01/2016 at 1330 hours FINDINGS: Enteric tube terminates in the distal 2nd portion of the duodenum. Contrast within the stomach, duodenum, and proximal jejunum. Duodenum is mildly prominent. Jejunum is not dilated. This has not significantly progressed since the conclusion of the small bowel follow-through. IMPRESSION: Contrast within the stomach, duodenum, and proximal jejunum, not significantly progressed since the conclusion of the small bowel follow-through. Given the nondilated appearance of the proximal jejunum, this appearance remains compatible with adynamic ileus rather than small bowel obstruction. Electronically Signed   By: Julian Hy M.D.   On: 12/01/2016 20:06   Ct Abdomen Pelvis W Contrast  Result Date: 11/22/2016 CLINICAL DATA:  Metastatic gastric cancer with abdominal pain and nausea. EXAM: CT ABDOMEN AND PELVIS WITH CONTRAST TECHNIQUE: Multidetector CT imaging of the abdomen and pelvis was performed using the standard protocol following bolus administration of intravenous contrast. CONTRAST:  147mL ISOVUE-300 IOPAMIDOL (ISOVUE-300) INJECTION 61% COMPARISON:  09/26/2016 FINDINGS: Lower chest: Development of moderate left-sided pleural effusion with adjacent compressive atelectasis. Heart size is normal without pericardial effusion. Hepatobiliary: No focal liver lesions. Gallbladder is physiologically distended. No biliary dilatation. Pancreas: Normal without pancreatic mass, ductal dilatation or inflammation. Spleen: No splenic lesions or splenomegaly. Adrenals/Urinary Tract: Adrenal  glands are unremarkable. Kidneys are normal, without renal calculi, focal lesion, or hydronephrosis. Bladder is unremarkable. Stomach/Bowel: The stomach is distended with enteric contrast. Slight anterior gastric wall thickening along the body and antrum. Scattered colonic diverticulosis without acute diverticulitis. No evidence of bowel obstruction. No free air or portal venous air. Vascular/Lymphatic: No abdominal aortic aneurysm. No gastrohepatic, portal venous, retroperitoneal, mesenteric or pelvic sidewall adenopathy. No inguinal adenopathy. Reproductive: Normal size prostate and seminal vesicles with central zone calcifications of the prostate. Other: Large volume of ascites though decreased in volume is subjectively from 09/26/2016 comparison. Mesenteric edema from the ascites is noted as well as the irregular omental thickening and caking anteriorly as before. Musculoskeletal: No aggressive appearing osseous lesions. Small fat containing umbilical hernia. IMPRESSION: 1. Development of new moderate left pleural effusion with adjacent compressive atelectasis. 2. Slight decrease in large volume of ascites within the abdomen and pelvis. 3. Omental caking and thickening again re- demonstrated with mild anterior gastric wall thickening consistent with patient's history of gastric cancer with metastasis. Electronically Signed   By: Ashley Royalty M.D.   On: 11/22/2016 20:20   US Abdomen Limited  Result Date: 11/10/2016 CLINICAL DATA:  History of metastatic gastric carcinoma with malignant ascites and prior paracentesis procedures. EXAM: LIMITED ABDOMEN ULTRASOUND FOR ASCITES TECHNIQUE: Limited ultrasound survey for ascites was performed in all four abdominal quadrants. COMPARISON:  Recent abdominal ultrasound on 10/28/2016 and most recent paracentesis procedure on 10/21/2016. FINDINGS: Ultrasound performed today shows a small amount of ascites in the peritoneal cavity, appearing even smaller in volume compared to  the 10/28/2016 ultrasound study. Paracentesis was therefore not performed. IMPRESSION: Small amount of ascites  in the peritoneal cavity. Overall amount appears even smaller compared to the 9/25 study. Paracentesis was not performed today. Electronically Signed   By: Aletta Edouard M.D.   On: 11/10/2016 11:14   US Venous Img Lower Bilateral  Result Date: 11/30/2016 CLINICAL DATA:  Swelling EXAM: BILATERAL LOWER EXTREMITY VENOUS DOPPLER ULTRASOUND TECHNIQUE: Gray-scale sonography with compression, as well as color and duplex ultrasound, were performed to evaluate the deep venous system from the level of the common femoral vein through the popliteal and proximal calf veins. COMPARISON:  None FINDINGS: Normal compressibility of the common femoral, superficial femoral, and popliteal veins, as well as the proximal calf veins. No filling defects to suggest DVT on grayscale or color Doppler imaging. Doppler waveforms show normal direction of venous flow, normal respiratory phasicity and response to augmentation. IMPRESSION: No evidence of  lower extremity deep vein thrombosis, bilaterally. Electronically Signed   By: Lucrezia Europe M.D.   On: 11/30/2016 14:00   US Paracentesis  Result Date: 11/21/2016 INDICATION: Abdominal discomfort. History of ascites. Request diagnostic and therapeutic paracentesis. EXAM: ULTRASOUND GUIDED RIGHT UPPER QUADRANT PARACENTESIS MEDICATIONS: None. COMPLICATIONS: None immediate. PROCEDURE: Informed written consent was obtained from the patient after a discussion of the risks, benefits and alternatives to treatment. A timeout was performed prior to the initiation of the procedure. Initial ultrasound scanning demonstrates a small to moderate amount of densely loculated ascites within the right upper abdominal quadrant. The right upper abdomen was prepped and draped in the usual sterile fashion. 1% lidocaine with epinephrine was used for local anesthesia. Following this, a Safe-T-Centesis  catheter was introduced. An ultrasound image was saved for documentation purposes. The paracentesis was performed. The catheter was removed and a dressing was applied. The patient tolerated the procedure well without immediate post procedural complication. FINDINGS: A total of approximately 660 mL of hazy, blood-tinged fluid was removed. Samples were sent to the laboratory as requested by the clinical team. IMPRESSION: Densely loculated pocket of ascites in the right upper quadrant. Successful ultrasound-guided paracentesis yielding 660 mL of peritoneal fluid. Read by: Ascencion Dike PA-C Electronically Signed   By: Sandi Mariscal M.D.   On: 11/21/2016 10:32   Dg Chest Port 1 View  Result Date: 11/20/2016 CLINICAL DATA:  Fever. EXAM: PORTABLE CHEST 1 VIEW COMPARISON:  CT chest dated September 04, 2016. FINDINGS: Right chest wall port catheter with tip near the cavoatrial junction. The cardiomediastinal silhouette is borderline enlarged. Low lung volumes. Dense consolidation of the left lower lobe with adjacent small left pleural effusion. No pneumothorax. No acute osseous abnormality. IMPRESSION: Findings concerning for left lower lobe pneumonia with adjacent small pleural effusion. Electronically Signed   By: Titus Dubin M.D.   On: 11/20/2016 21:53   Dg Abd 2 Views  Result Date: 11/09/2016 CLINICAL DATA:  36 year old male with a history of gastric cancer, and ascites now with bilateral flank pain radiating into the lower abdomen EXAM: ABDOMEN - 2 VIEW COMPARISON:  Prior CT scan of the abdomen and pelvis 09/26/2016 FINDINGS: No evidence of free air. Relative paucity of bowel gas. A small amount residual contrast material opacifies the descending colon, sigmoid colon and rectum. The structures are nondilated. Relatively increased density of the abdominal soft tissue suggests underlying ascites. No evidence of nephrolithiasis or other abnormal calcification. Osseous structures are intact and unremarkable.  IMPRESSION: 1. No evidence of bowel obstruction. 2. No visible nephrolithiasis. 3. Suspect ascites. Electronically Signed   By: Jacqulynn Cadet M.D.   On: 11/09/2016 09:38  Dg Duanne Limerick W/small Bowel  Result Date: 12/01/2016 CLINICAL DATA:  Gastric malignancy with peritoneal carcinomatosis NG add dysmotility. Patient underwent placement of a nasogastric tube via endoscopic direction. EXAM: UPPER GI SERIES WITH SMALL BOWEL FOLLOW-THROUGH using 120 cc of thin barium and 30 cc of water. FLUOROSCOPY TIME:  Fluoroscopy Time:  1 minutes, 30 seconds Radiation Exposure Index (if provided by the fluoroscopic device): 1122 micro Gy per meters square Number of Acquired Spot Images: 8+ multiple overhead images over the course of 2.5 hours. TECHNIQUE: Combined double contrast and single contrast upper GI series using effervescent crystals, thick barium, and thin barium. Subsequently, serial images of the small bowel were obtained including spot views of the terminal ileum. COMPARISON:  Abdominal and pelvic CT scan of November 22, 2016 FINDINGS: The scout film revealed no dilated small or large bowel loops. A small amount of gas was noted within the stomach. The nasogastric tube tip lay at the junction of the second and third portions of the duodenum with the proximal port just distal to the duodenal bulb. A total of 120 cc of barium plus a flush of 30 cc was slowly instilled through the nasogastric tube. There was initial filling of the first and second portions of duodenum with free reflux of across the pylorus into the get stomach cavity. The stomach appeared fairly normal in contour. Barium then passed distally into the third portion of the duodenum and ultimately into the proximal jejunum over the course of 2.5 hours. The appearance of the visualized small bowel loops was normal. Peristalsis was a moderate within the duodenum. The study was terminated with the image obtained at 4 p.m. and serial supine abdominal radiographs  will be obtained through the night and in the morning. IMPRESSION: Very slow transit of the barium in the small bowel reaching only the proximal jejunum by 2.5 hours. Evidence of wide patency of the pyloric channel with reflux of the barium into the stomach. Transient gastroesophageal reflux was observed during the course of the procedure. Additional abdominal radiographs will be obtained throughout the evening and overnight to follow the course of the barium. The findings were discussed by me by telephone with Dr.Konidena at 4:50 p.m. on 29 October, 2018. Electronically Signed   By: David  Martinique M.D.   On: 12/01/2016 16:49   US Abdomen Limited Ruq  Result Date: 11/23/2016 CLINICAL DATA:  RIGHT upper quadrant pain. EXAM: ULTRASOUND ABDOMEN LIMITED RIGHT UPPER QUADRANT COMPARISON:  CT abdomen and pelvis 11/22/2016. Ultrasound-guided paracentesis 11/21/2016. FINDINGS: Gallbladder: No gallstones or wall thickening visualized. No sonographic Murphy sign noted by sonographer. Common bile duct: Diameter: 3.4 mm, normal Liver: No focal lesion identified. Within normal limits in parenchymal echogenicity. Portal vein is patent on color Doppler imaging with normal direction of blood flow towards the liver. Additional findings: Ascites. Compared with 11/21/16 sonography, ascitic fluid in the RIGHT upper quadrant has recurred. IMPRESSION: No gallstones or biliary ductal dilatation. Malignant ascites has recurred in the RIGHT upper quadrant. Electronically Signed   By: Staci Righter M.D.   On: 11/23/2016 09:42    Micro Results    No results found for this or any previous visit (from the past 240 hour(s)).     Today   Subjective:   Anthony Melendez today , continues to feel miserable with nausea, vomiting.  Objective:   Blood pressure 94/63, pulse 61, temperature 97.7 F (36.5 C), temperature source Oral, resp. rate 18, height 5\' 5"  (1.651 m), weight 144 lb 1.6 oz (65.4 kg),  SpO2 95 %.   Intake/Output  Summary (Last 24 hours) at 12/04/16 1748 Last data filed at 12/03/16 2200  Gross per 24 hour  Intake                0 ml  Output                1 ml  Net               -1 ml    Exam Awake Alert, Oriented x 3, No new F.N deficits, Normal affect [place].AT,PERRAL Supple Neck,No JVD, No cervical lymphadenopathy appriciated.  Symmetrical Chest wall movement, Good air movement bilaterally, CTAB RRR,No Gallops,Rubs or new Murmurs, No Parasternal Heave +ve B.Sounds, Abd Soft, Non tender, No organomegaly appriciated, No rebound -guarding or rigidity. No Cyanosis, Clubbing or edema, No new Rash or bruise  Data Review   CBC w Diff:  Lab Results  Component Value Date   WBC 9.1 12/01/2016   HGB 10.6 (L) 12/01/2016   HGB 17.5 06/28/2012   HCT 32.4 (L) 12/01/2016   HCT 49.8 06/28/2012   PLT 164 12/01/2016   PLT 206 06/28/2012   LYMPHOPCT 12 10/21/2016   MONOPCT 8 10/21/2016   EOSPCT 1 10/21/2016   BASOPCT 1 10/21/2016    CMP:  Lab Results  Component Value Date   NA 140 11/28/2016   NA 141 06/28/2012   K 3.9 11/28/2016   K 3.4 (L) 06/28/2012   CL 107 11/28/2016   CL 106 06/28/2012   CO2 28 11/28/2016   CO2 27 06/28/2012   BUN 22 (H) 11/28/2016   BUN 12 06/28/2012   CREATININE 0.65 12/01/2016   CREATININE 0.73 06/28/2012   PROT 6.7 11/24/2016   PROT 7.6 06/28/2012   ALBUMIN 2.7 (L) 11/24/2016   ALBUMIN 3.8 06/28/2012   BILITOT 0.7 11/24/2016   BILITOT 0.6 06/28/2012   ALKPHOS 90 11/24/2016   ALKPHOS 77 06/28/2012   AST 22 11/24/2016   AST 45 (H) 06/28/2012   ALT 12 (L) 11/24/2016   ALT 69 06/28/2012  .   Total Time in preparing paper work, data evaluation and todays exam - 35 minutes  Dustin Flock M.D on 12/04/2016 at 5:48 PM    Note: This dictation was prepared with Dragon dictation along with smaller phrase technology. Any transcriptional errors that result from this process are unintentional.

## 2016-12-04 NOTE — Progress Notes (Signed)
[place] [facility] [place] NAME: Anthony Melendez    MR#:  [id number]  DATE OF BIRTH:  [birth year]   Patient NG tube is still draining a lot of bilious fluid, nausea because of NG tube eating small bites of food.  TPN is not ordered yet because he is waiting for transfer to do and I am hoping he will go to [facility] today if not we will start TPN.   CHIEF COMPLAINT:   Chief Complaint  Patient presents with  . Abdominal Pain   Spanish interpreter.  Continues to have nausea and vomiting.  Feels weak.  Afebrile.  REVIEW OF SYSTEMS:  Review of Systems  Constitutional: Positive for malaise/fatigue and weight loss. Negative for chills and fever.  HENT: Negative for congestion, ear discharge, hearing loss and nosebleeds.   Eyes: Negative for blurred vision and double vision.  Respiratory: Negative for cough, shortness of breath and wheezing.   Cardiovascular: Negative for chest pain, palpitations and leg swelling.  Gastrointestinal: Positive for abdominal pain, nausea and vomiting. Negative for constipation and diarrhea.  Genitourinary: Negative for dysuria and urgency.  Musculoskeletal: Positive for back pain. Negative for myalgias.  Neurological: Negative for dizziness, seizures and headaches.  Psychiatric/Behavioral: Negative for depression.    DRUG ALLERGIES:   Allergies  Allergen Reactions  . Iodine     Possible anaphylaxis per MD Per conversation with pt, pt denies allergy to IV contrast.  Pt injected 09/26/16 without premeds, pt with no reaction.    VITALS:  Blood pressure 99/62, pulse 67, temperature 98.2 F (36.8 C), temperature source Oral, resp. rate 20, height 5\' 5"  (1.651 m), weight 65.4 kg (144 lb 1.6 oz), SpO2 94 %.  PHYSICAL EXAMINATION:  Physical Exam  GENERAL:  36 y.o.-year-old ill-nourished patient lying in the bed, sleeping,seen in EYES: Pupils equal, round, reactive to light and accommodation. No scleral icterus.  Extraocular muscles intact. HEENT: Head atraumatic, normocephalic. Oropharynx and nasopharynx clear. NECK:  Supple, no jugular venous distention. No thyroid enlargement, no tenderness.  LUNGS: Normal breath sounds bilaterally, no wheezing, rales,rhonchi or crepitation. No use of accessory muscles of respiration. Decreased bibasilar breath sounds. CARDIOVASCULAR: S1, S2 normal. No murmurs, rubs, or gallops.  ABDOMEN: Soft, nontender but discomfort on palpation in left and right upper quadrant, distended and tight/firm. Hypoactive Bowel sounds present. No organomegaly or mass.  EXTREMITIES: Lower extremity edema NEUROLOGIC: Moves all 4 extremities PSYCHIATRIC: The patient is awake and alert. SKIN: No obvious rash, lesion, or ulcer.    LABORATORY PANEL:   CBC  Recent Labs Lab 12/01/16 0418  WBC 9.1  HGB 10.6*  HCT 32.4*  PLT 164   ------------------------------------------------------------------------------------------------------------------  Chemistries   Recent Labs Lab 11/28/16 0545 12/01/16 0418  NA 140  --   K 3.9  --   CL 107  --   CO2 28  --   GLUCOSE 154*  --   BUN 22*  --   CREATININE 0.64 0.65  CALCIUM 8.1*  --    ------------------------------------------------------------------------------------------------------------------  Cardiac Enzymes No results for input(s): TROPONINI in the last 168 hours. ------------------------------------------------------------------------------------------------------------------  RADIOLOGY:  No results found.  EKG:   Orders placed or performed during the hospital encounter of 11/20/16  . EKG 12-Lead  . EKG 12-Lead  . EKG 12-Lead  . EKG 12-Lead    ASSESSMENT AND PLAN:   36 year old male with past medical history significant for metastatic gastric cancer status post large volume paracentesis presents to hospital secondary to abdominal  pain and nausea.  # Community-acquired pneumonia-left lower lobe infiltrate  noted on chest x-ray. - Blood cultures are negative. Currently on zosyn. Stopped after 7 days -  -Stage IV gastric cancer with a chronic nausea, vomiting, decreased p.o. intake: Recent upper GI, small bowel follow-through showed high-grade obstruction, patient is been accepted at [facility], waiting for bed.  Patient has peritoneal carcinomatosis, poor prognosis.  For now continue NG tube to low intermittent suction, liquid diet as much as he wants, unable to start TPN because patient is pending transfer to [place], if he is not going to do today we will start the TPN, the same discussed with patient's family.  # Severe protein calorie malnutrition Eating little better but not enough.  Dietitian recommended TPN did not start that yet because patient is waiting for bed at [facility],  # Hypokalemia and hypomagnesemia-replaced  # Metastatic gastric carcinoma- stage 4-diagnosed in June 2017, metastases to the omentum and also malignant ascites. Unfortunately no treatment options available per oncology.  D#Lower extremity edema.  Will check venous Dopplers to rule out DVT.  # DVT prophylaxis - lovenox   CODE STATUS: Full code  TOTAL TIME TAKING CARE OF THIS PATIENT: 25  minutes.    Epifanio Lesches M.D on 12/04/2016 at 8:50 AM  Between 7am to 6pm - Pager - [phone]  After 6pm go to [email or web address] - password [email or web address] [place]  Office  [phone]  CC: Primary care physician; Patient, No Pcp Per

## 2016-12-04 NOTE — Progress Notes (Signed)
PHARMACY - ADULT TOTAL PARENTERAL NUTRITION CONSULT NOTE   Pharmacy Consult for TPN Indication: Malnutrition in the setting of metastatic gastric cancer and high grade bowel obstruction  Patient Measurements: Height: 5\' 5"  (165.1 cm) Weight: 144 lb 1.6 oz (65.4 kg) IBW/kg (Calculated) : 61.5 TPN AdjBW (KG): 68 Body mass index is 23.98 kg/m.    Assessment:    GI: NG tube, Metastatic gastric cancer. Metastases to the omentum and also malignant ascites. Endo:  Insulin requirements in the past 24 hours: none  Lytes: Lab Results  Component Value Date   CREATININE 0.65 12/01/2016   BUN 22 (H) 11/28/2016   NA 140 11/28/2016   K 3.9 11/28/2016   CL 107 11/28/2016   CO2 28 11/28/2016    Renal:Serum creatinine: 0.65 mg/dL 12/01/16 0418 Estimated creatinine clearance: 108.9 mL/min Pulm:  Cards:  Hepatobil: Neuro: ID:  Best Practices: TPN Access:  TPN start date: 11/1  Nutritional Goals (per RD recommendation on 11/1): KCal: 2233 Protein: 99.6gm  Current Nutrition:   Plan:  11/1 Clinimix 5/20 E at 40 ml/hr 11/1 20% lipid emulsion at 75ml/hr 11/1 IVMF (D5, NS, etc.) at 60 ml/hr  This provides 99.6 g of protein and 2233 kCals per day meeting 48.2% of protein and 48.2% of kCal needs Monitor TPN labs, daily weights, BMP, Mg x 3 days, Phos x 3 days F/U 11/02  Donna Christen Alaya Iverson 12/04/2016,2:43 PM

## 2016-12-04 NOTE — Progress Notes (Signed)
Spoke with Dr. Vianne Bulls, dietitian and charge nurse re PICC order.  Has PAC available for TNA infusion.  Ok to start PIV for remaining IVF/ medications.  If unable to place PIV, then PICC will be placed.

## 2016-12-05 NOTE — Progress Notes (Signed)
Pt was discharged to [facility] for procedure. NG Tube was clamped, port was sailing locked, report was given previously. Family at bedside. Pt was escorted by [facility] . Continue to monitor.

## 2017-01-03 DEATH — deceased

## 2017-01-08 ENCOUNTER — Other Ambulatory Visit: Payer: Self-pay | Admitting: Nurse Practitioner

## 2017-04-04 IMAGING — CT CT ABD-PELV W/ CM
2 of 4 series · 16 of 46 positions shown, 18 images · IV contrast (iopamidol)
Comparison: CTs 07/29/2015 and 10/05/2015.

CLINICAL DATA: Epigastric pain with intermittent nausea. Malignant
neoplasm of the stomach diagnosed 3 months ago with chemotherapy
ongoing.

EXAM:
CT ABDOMEN AND PELVIS WITH CONTRAST
TECHNIQUE: Multidetector CT imaging of the abdomen and pelvis was performed
using the standard protocol following bolus administration of
intravenous contrast.
CONTRAST:  100mL [id number] IOPAMIDOL ([id number]) INJECTION 61%

[Series 2: axial st · axial · 0.81mm/px · z∈[-136,+289]mm · 13 of 95 slices shown, 15 images]
[im 5/95  soft-tissue]
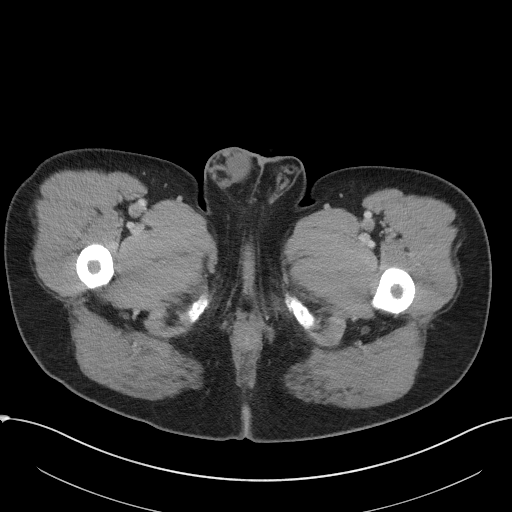
[im 5/95  bone]
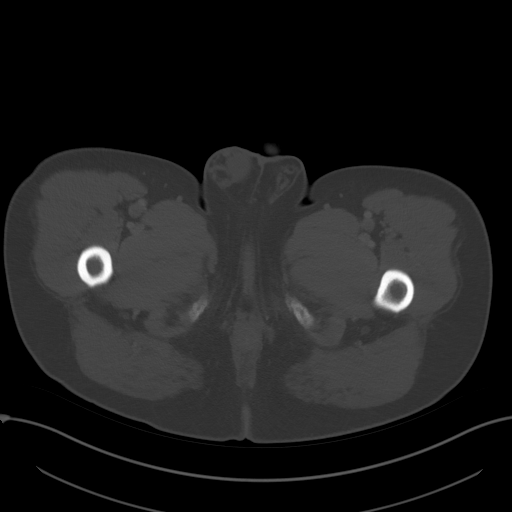
[im 13/95  soft-tissue]
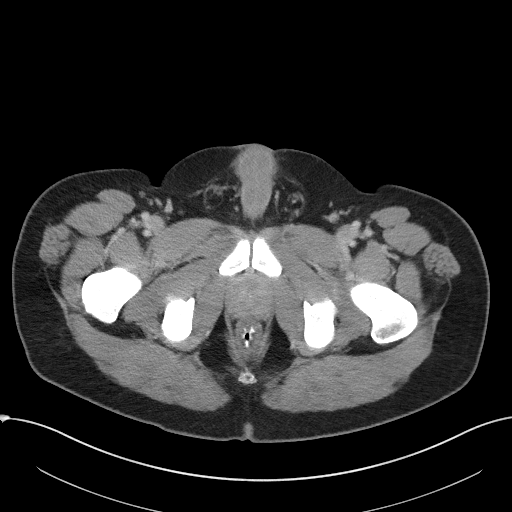
[im 21/95  soft-tissue]
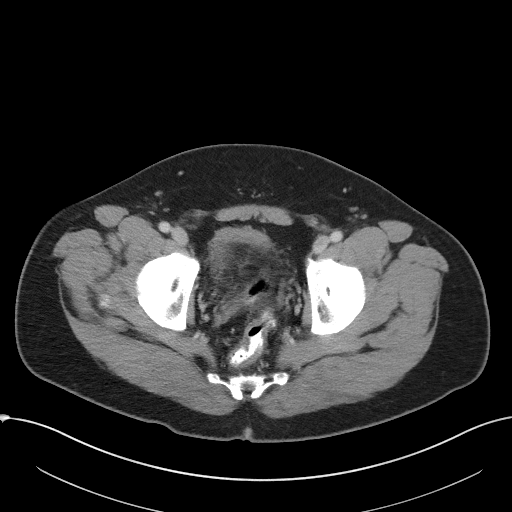
[im 25/95  soft-tissue]
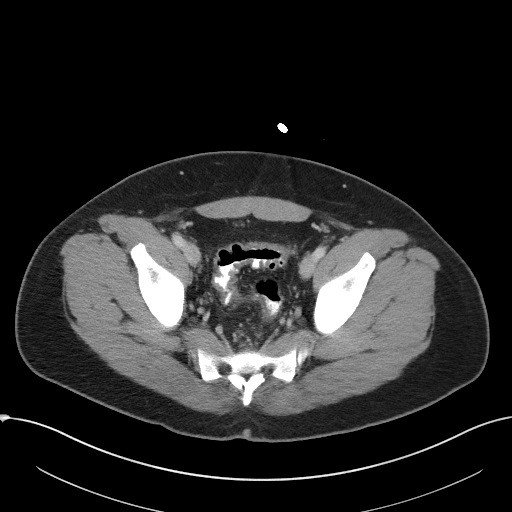
[im 33/95  soft-tissue]
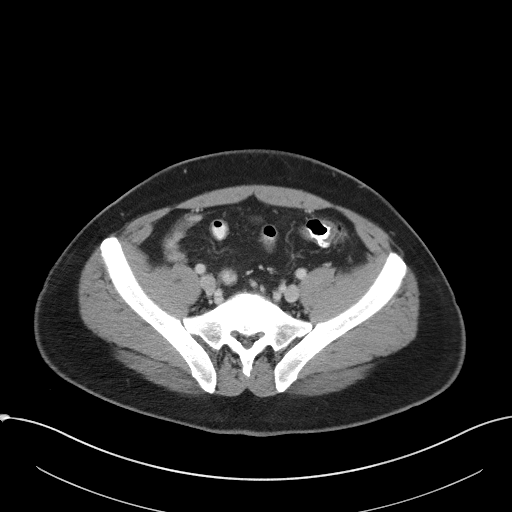
[im 41/95  soft-tissue]
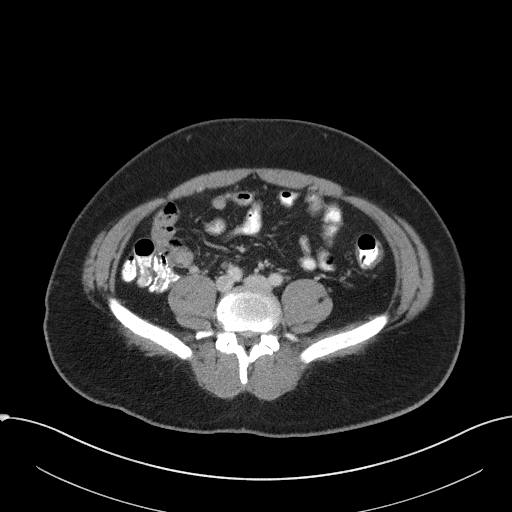
[im 50/95  soft-tissue]
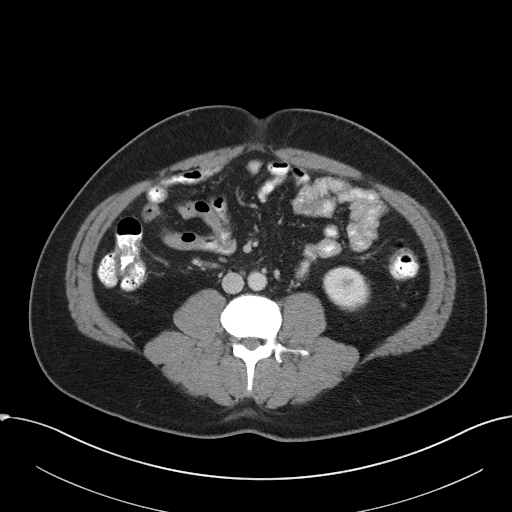
[im 54/95  soft-tissue]
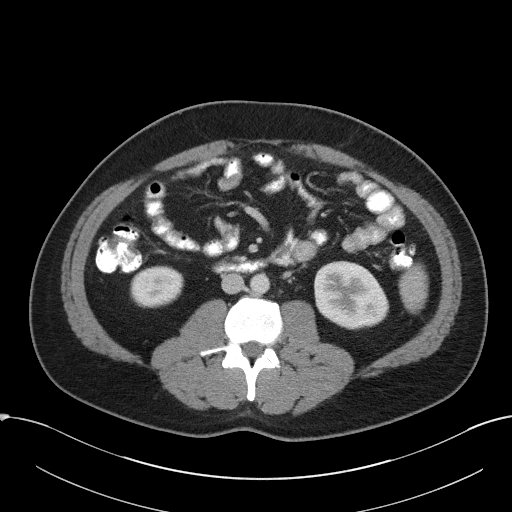
[im 62/95  soft-tissue]
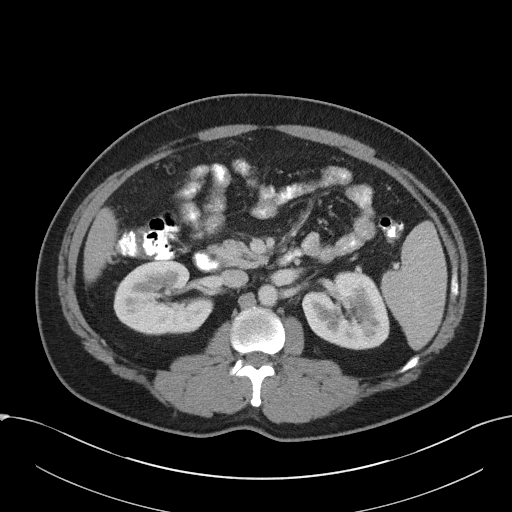
[im 62/95  bone]
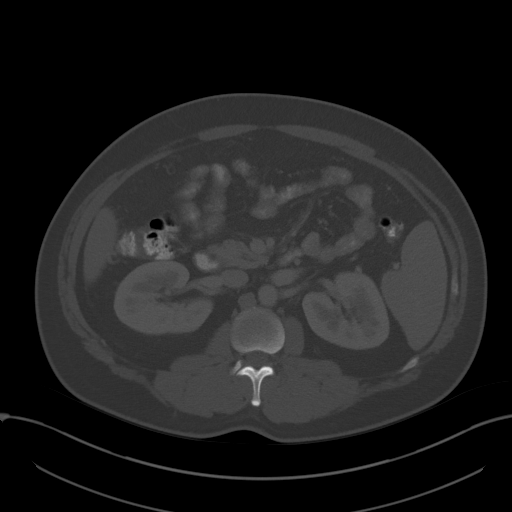
[im 70/95  soft-tissue]
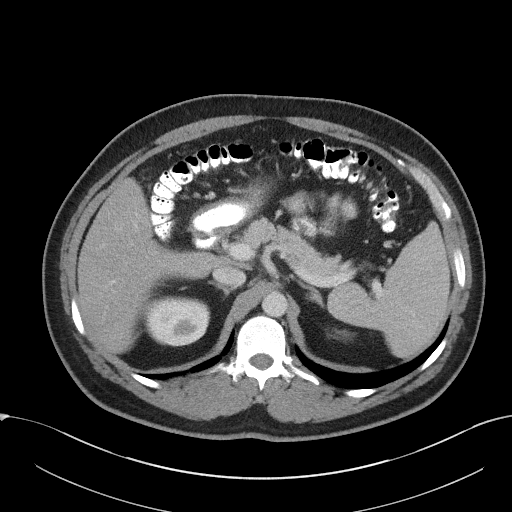
[im 74/95  soft-tissue]
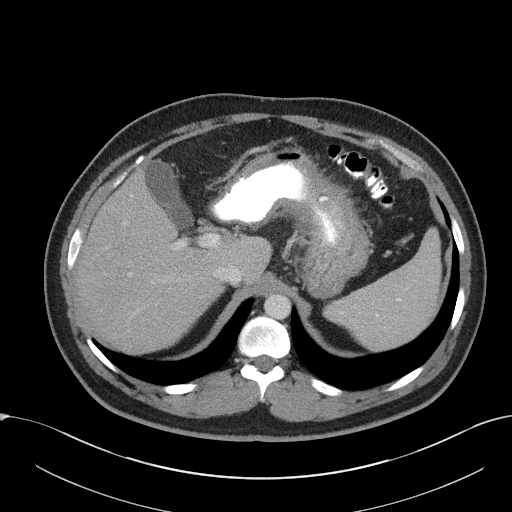
[im 82/95  soft-tissue]
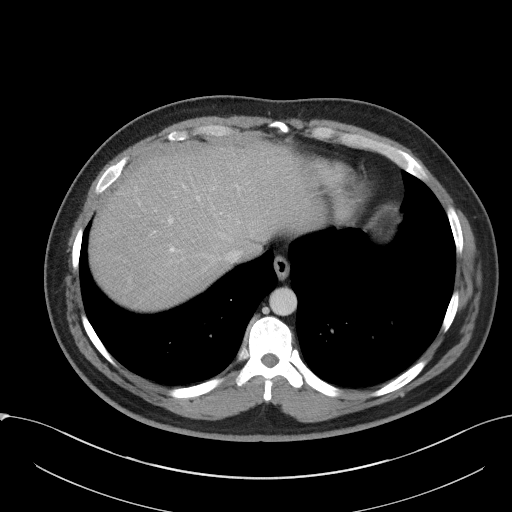
[im 90/95  soft-tissue]
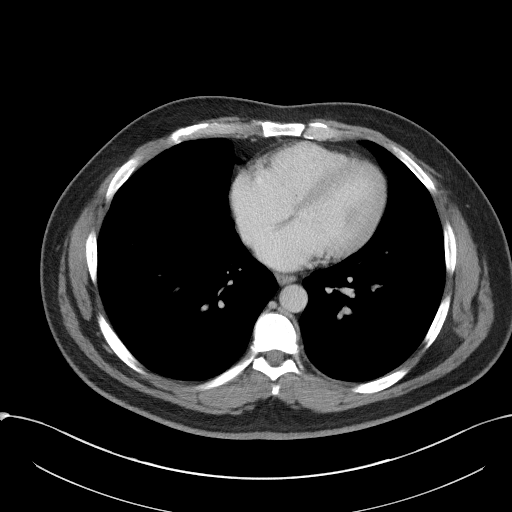

[Series 5: coronal st · coronal · 0.74mm/px · 3 of 93 slices shown]
[im 31/93  soft-tissue]
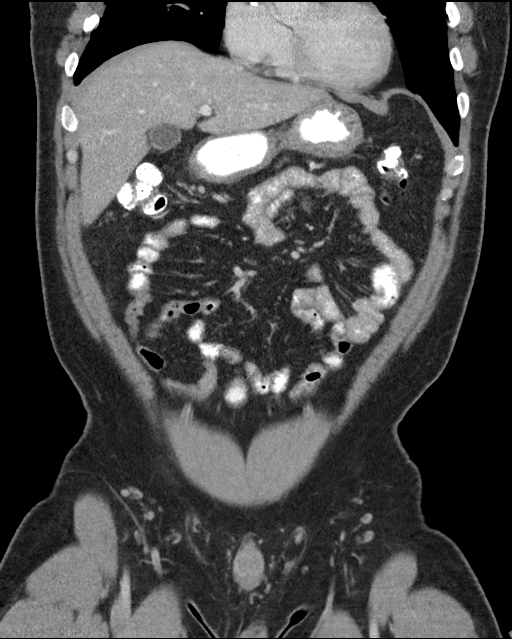
[im 41/93  soft-tissue]
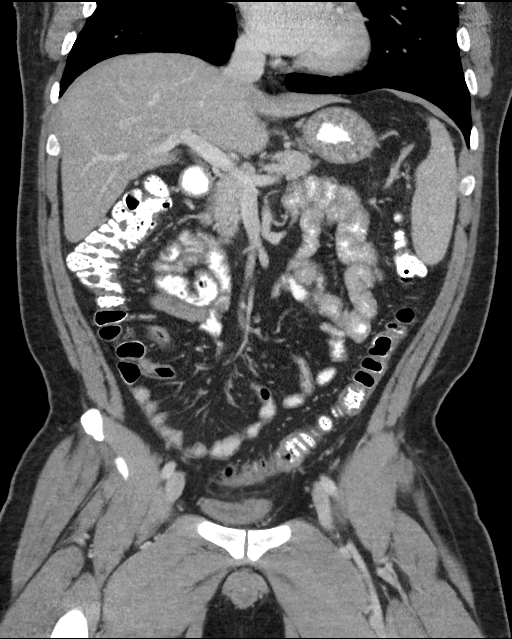
[im 52/93  soft-tissue]
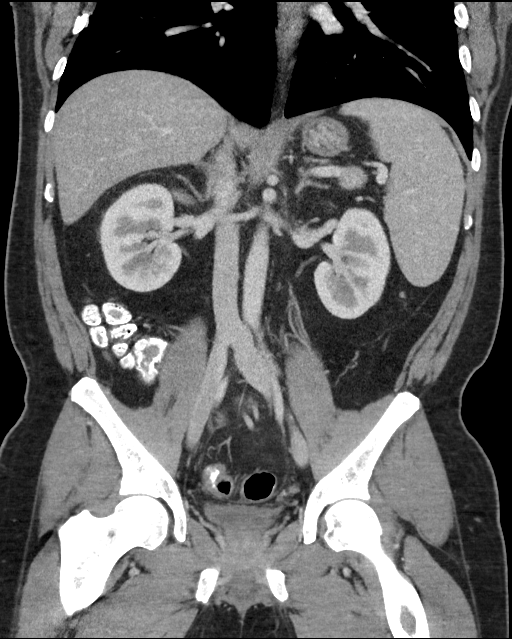

[16 of 46 positions shown; findings below may reference images not displayed]

FINDINGS: Lower chest: Clear lung bases. No significant pleural or pericardial
effusion. Probable central venous catheter tip near the SVC right
atrial junction.

Hepatobiliary: The liver is normal in density without focal
abnormality. No evidence of gallstones, gallbladder wall thickening
or biliary dilatation.

Pancreas: Unremarkable. No pancreatic ductal dilatation or
surrounding inflammatory changes.

Spleen: Normal in size without focal abnormality.

Adrenals/Urinary Tract: Both adrenal glands appear normal. Both
kidneys appear normal. No evidence of urinary tract calculus,
hydronephrosis or perinephric soft tissue stranding. The bladder is
nearly empty, but grossly normal.

Stomach/Bowel: There is mild residual circumferential gastric wall
thickening without apparent residual focal mass or ulceration. The
small bowel, appendix and proximal colon appear normal. Mild sigmoid
diverticulosis without surrounding inflammation.

Vascular/Lymphatic: Small residual lymph nodes within the
gastrohepatic ligament are stable. There is no retroperitoneal
lymphadenopathy. No significant vascular findings are present.

Reproductive: Stable central dystrophic calcifications within the
prostate gland.

Other: Interval resolution of probable peritoneal carcinomatosis
seen on prior studies. There is no ascites. A small umbilical hernia
containing only fat is stable.

Musculoskeletal: No acute or significant osseous findings.
IMPRESSION: 1. Further improvement in primary gastric malignancy and adjacent
lymphadenopathy. No residual peritoneal carcinomatosis identified.
2. No evidence of disease progression or acute findings.

## 2017-08-04 IMAGING — CT CT ABD-PELV W/O CM
2 of 4 series · 16 of 46 positions shown, 18 images · non-contrast
Comparison: CT the abdomen and pelvis 03/21/2016.

CLINICAL DATA: 35-year-old male with history of epigastric pain and
pelvic pain after eating. Patient has metastatic gastric
adenocarcinoma.

EXAM:
CT ABDOMEN AND PELVIS WITHOUT CONTRAST
TECHNIQUE: Multidetector CT imaging of the abdomen and pelvis was performed
following the standard protocol without IV contrast.

[Series 2: routine abd/pel wo · axial · 0.73mm/px · z∈[-495,-70]mm · 13 of 93 slices shown, 15 images]
[im 4/93  soft-tissue]
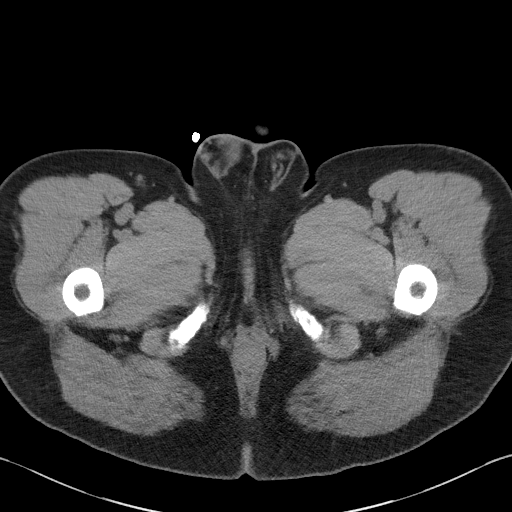
[im 4/93  bone]
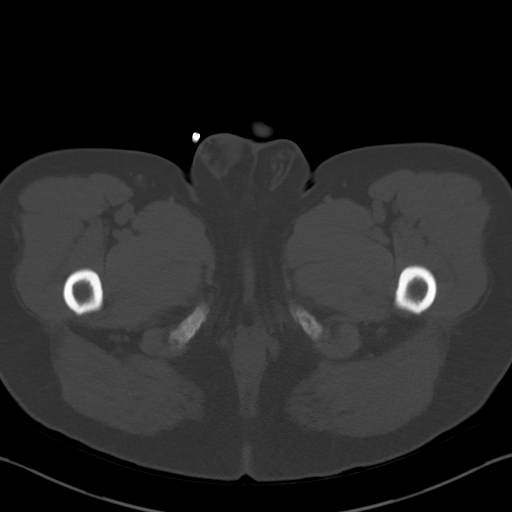
[im 12/93  soft-tissue]
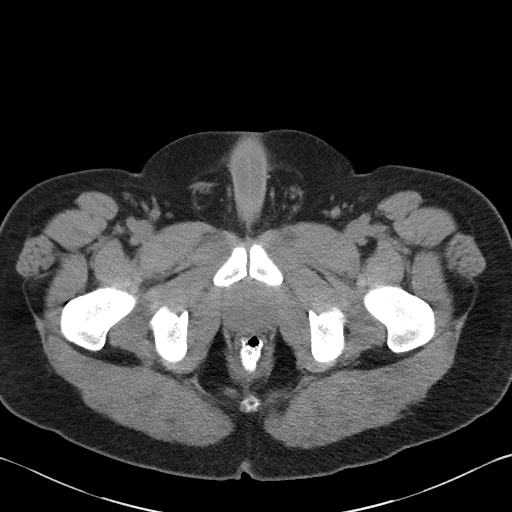
[im 20/93  soft-tissue]
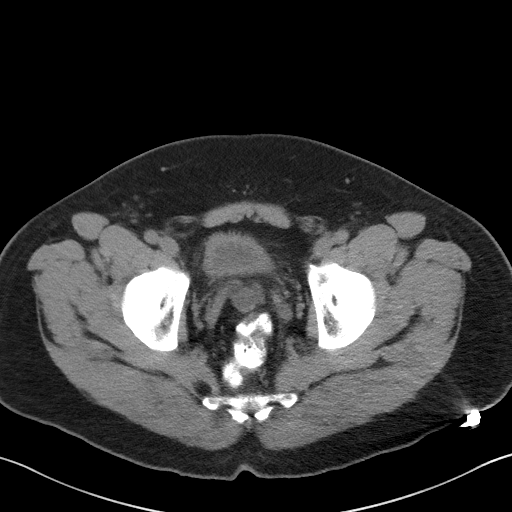
[im 27/93  soft-tissue]
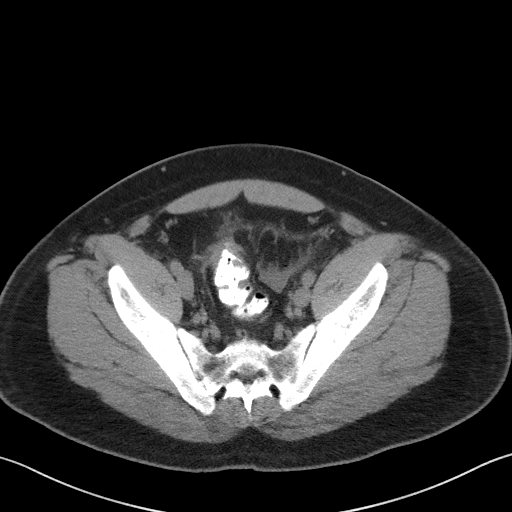
[im 31/93  soft-tissue]
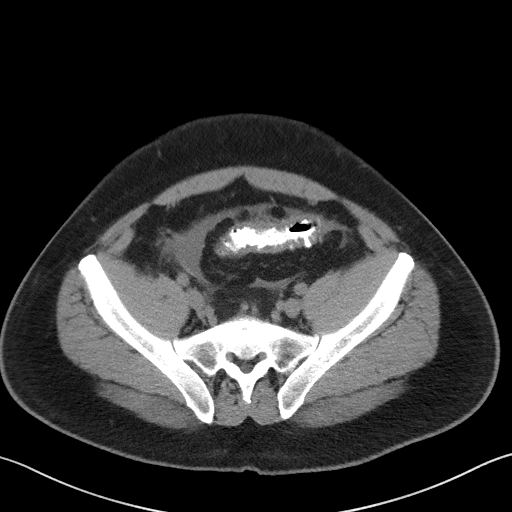
[im 39/93  soft-tissue]
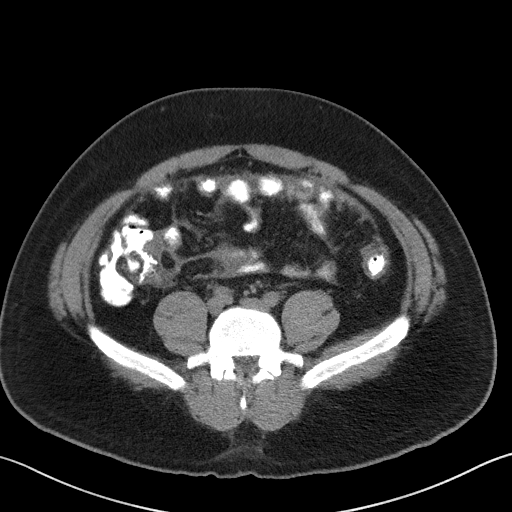
[im 47/93  soft-tissue]
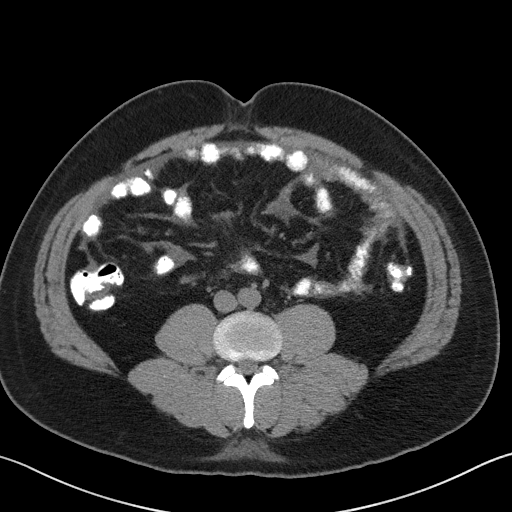
[im 54/93  soft-tissue]
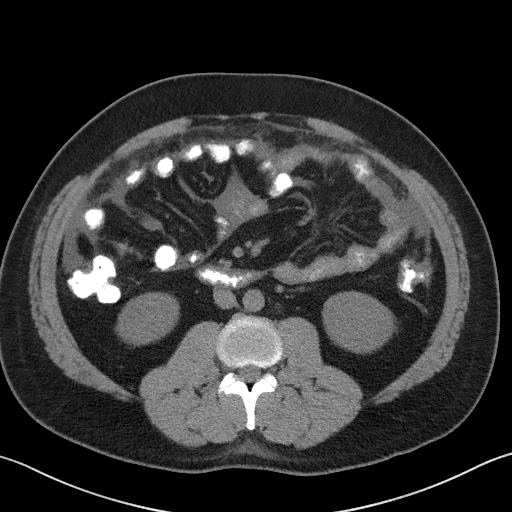
[im 62/93  soft-tissue]
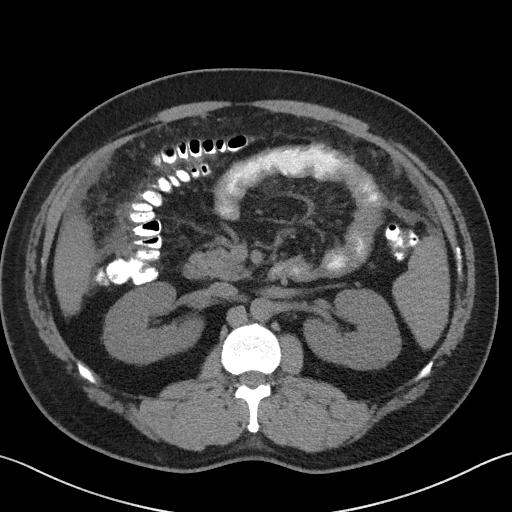
[im 62/93  bone]
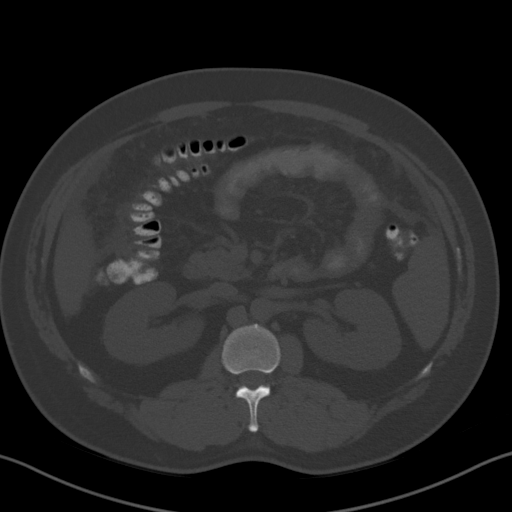
[im 66/93  soft-tissue]
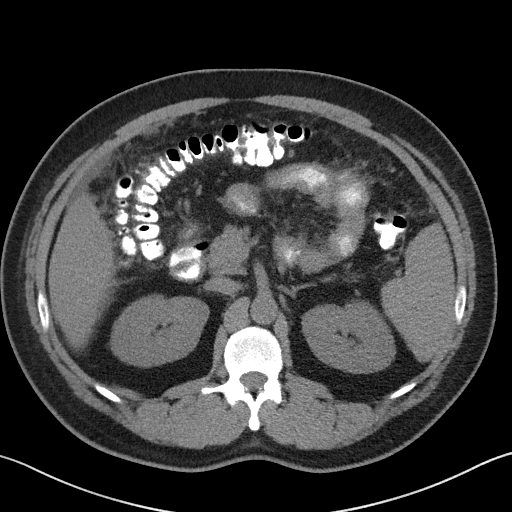
[im 73/93  soft-tissue]
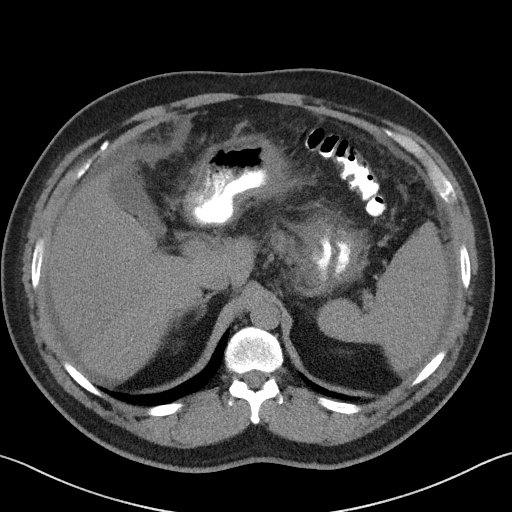
[im 81/93  soft-tissue]
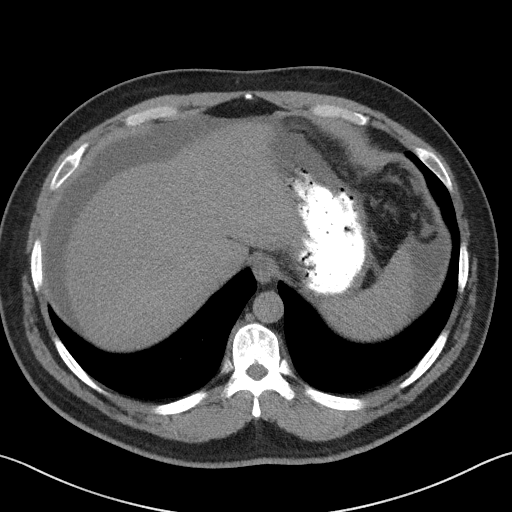
[im 89/93  soft-tissue]
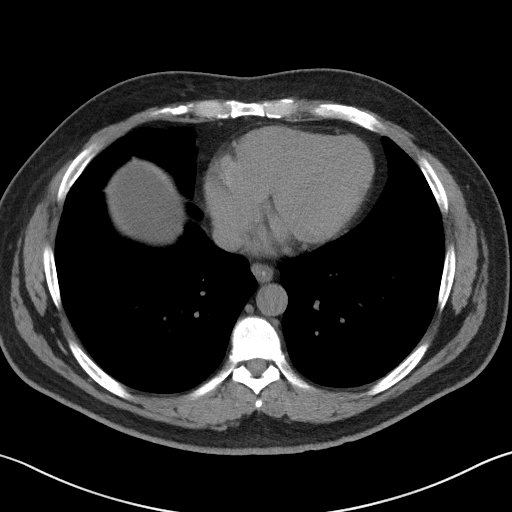

[Series 5: coronal st · coronal · 0.76mm/px · 3 of 101 slices shown]
[im 34/101  soft-tissue]
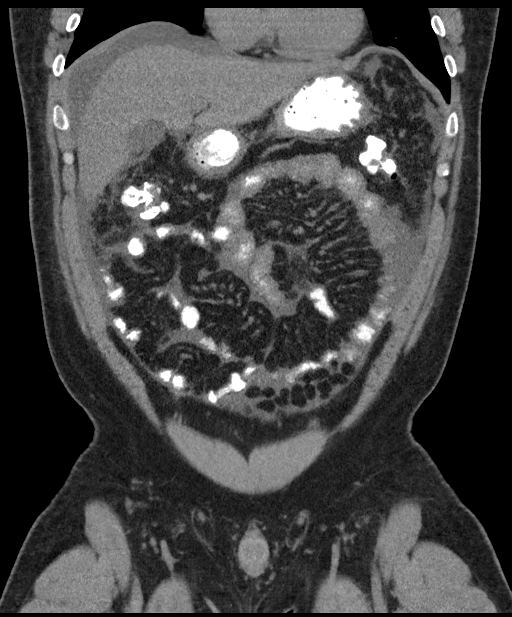
[im 45/101  soft-tissue]
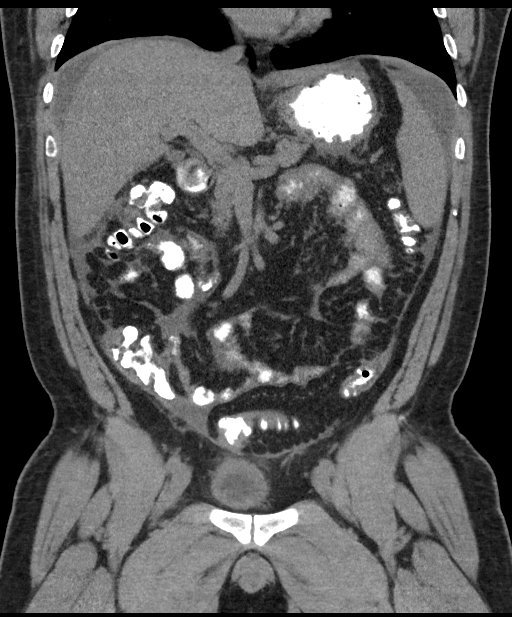
[im 56/101  soft-tissue]
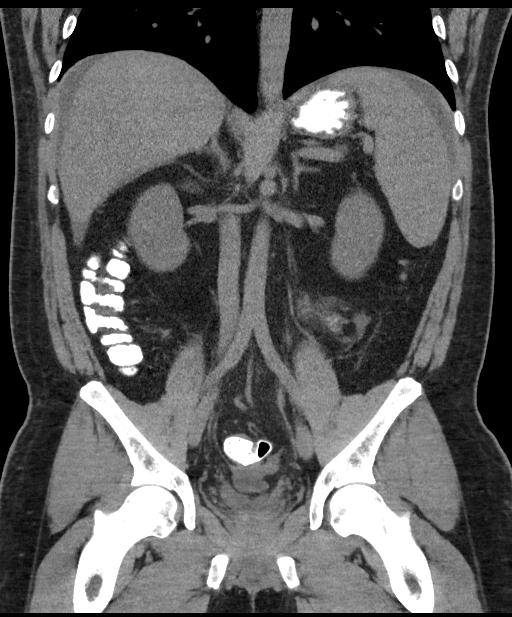

[16 of 46 positions shown; findings below may reference images not displayed]

FINDINGS: Lower chest: Unremarkable.

Hepatobiliary: No definite cystic or solid hepatic lesions on
today's noncontrast CT examination. Unenhanced appearance of the
gallbladder is normal.

Pancreas: No definite pancreatic mass or peripancreatic inflammatory
changes on today's noncontrast CT examination.

Spleen: Unremarkable.

Adrenals/Urinary Tract: Unenhanced appearance of the kidneys and
bilateral adrenal glands is normal. There is no
hydroureteronephrosis. Urinary bladder is normal in appearance.

Stomach/Bowel: Unenhanced appearance of the stomach is unremarkable.
No pathologic dilatation of small bowel or colon. The appendix is
not confidently identified and may be surgically absent.

Vascular/Lymphatic: No atherosclerotic calcifications or definite
aneurysm identified in the abdominal or pelvic vasculature. No
lymphadenopathy noted in the abdomen or pelvis on today's
noncontrast CT examination.

Reproductive: Prostate gland and seminal vesicles are unremarkable
in appearance.

Other: Increasing omental soft tissue, compatible with omental
caking. Increasing small volume of ascites throughout the peritoneal
cavity, likely malignant. No pneumoperitoneum.

Musculoskeletal: There are no aggressive appearing lytic or blastic
lesions noted in the visualized portions of the skeleton.
IMPRESSION: 1. Today's study again demonstrates evidence of progressive
intraperitoneal metastatic disease with increased ascites and
omental caking, as discussed above.
2. The stomach itself is grossly unremarkable on today's
examination.

## 2017-12-23 IMAGING — US US ABDOMEN LIMITED
1 series · 5 of 5 positions shown · non-contrast
Comparison: None.

CLINICAL DATA: Evaluation for possible paracentesis

EXAM:
LIMITED ABDOMEN ULTRASOUND FOR ASCITES
TECHNIQUE: Limited ultrasound survey for ascites was performed in all four
abdominal quadrants.

[Series 1: us abdomen limited · 0.23mm/px · 5 of 5 slices shown]
[im 1/5]
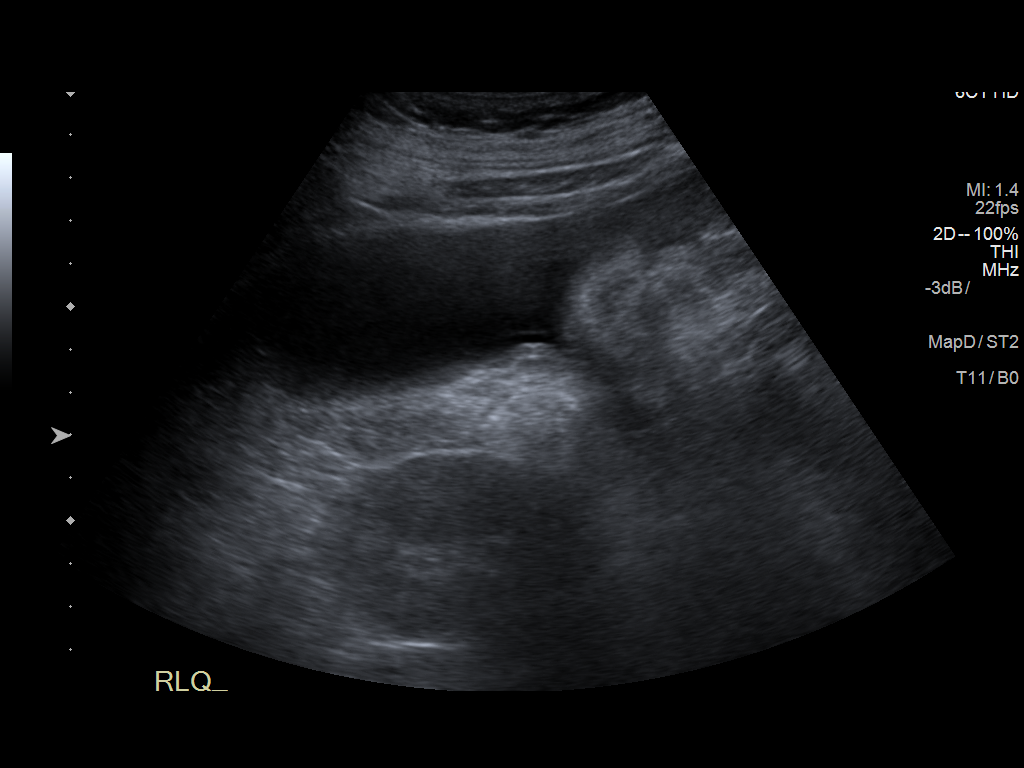
[im 2/5]
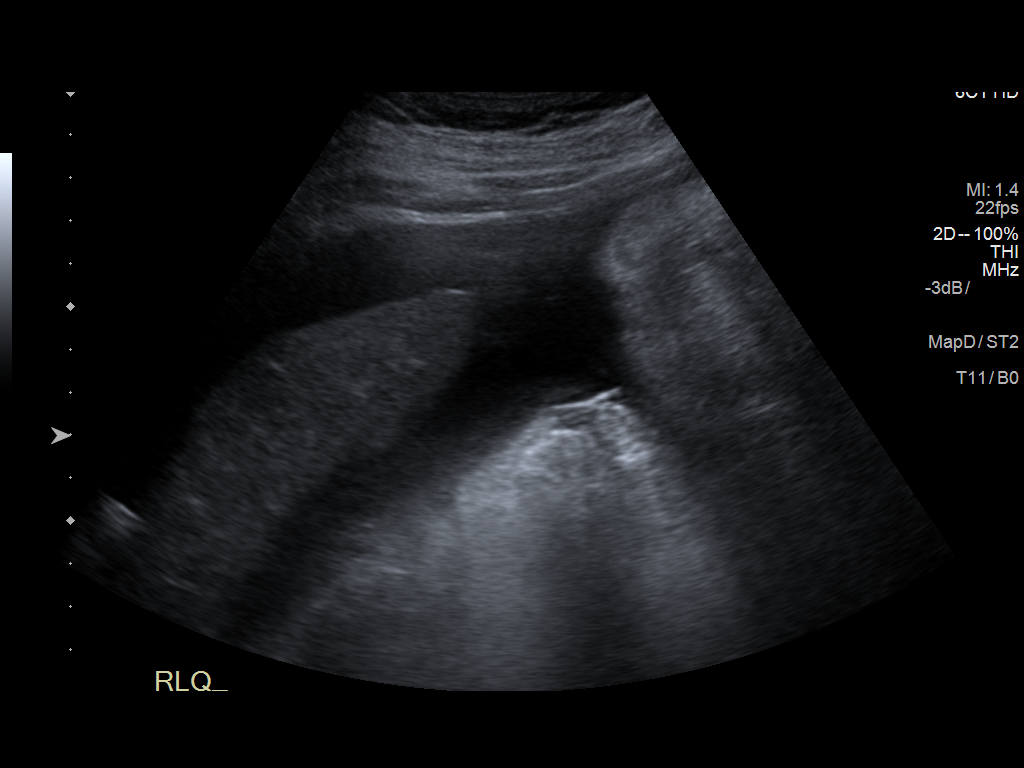
[im 3/5]
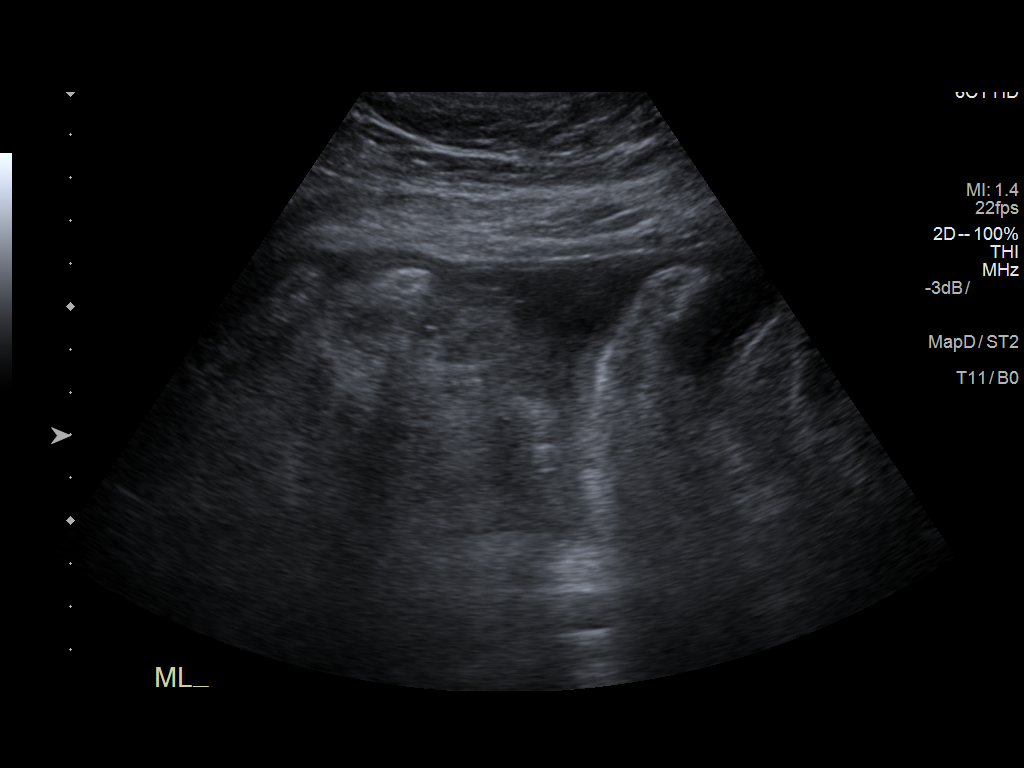
[im 4/5]
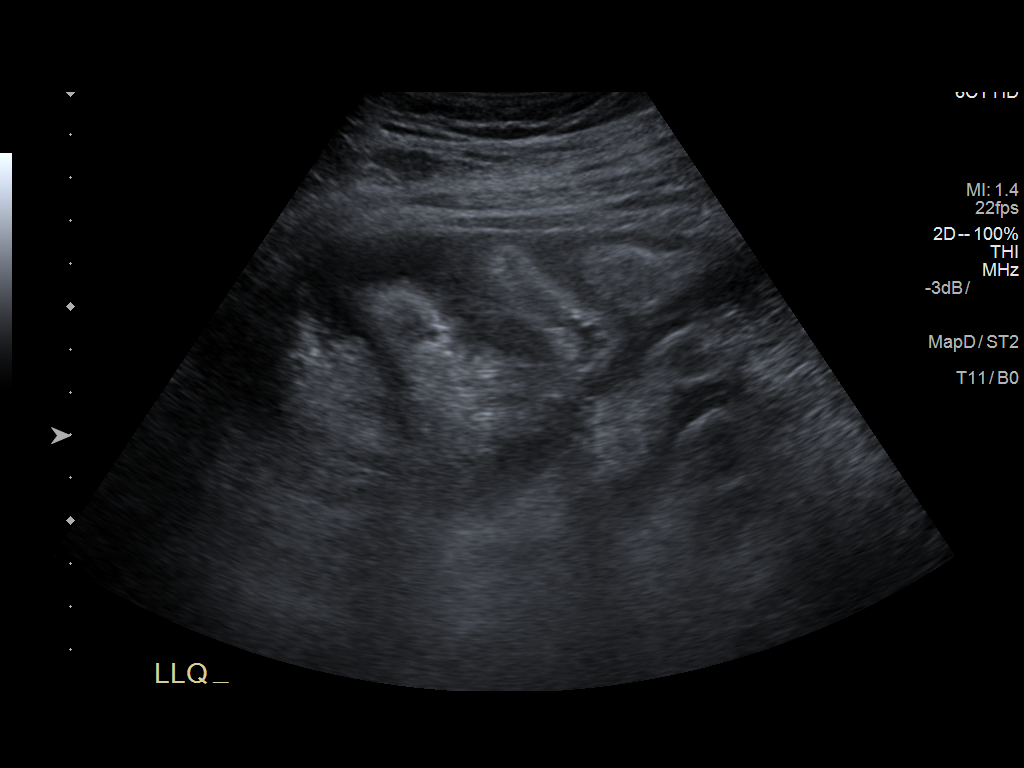
[im 5/5]
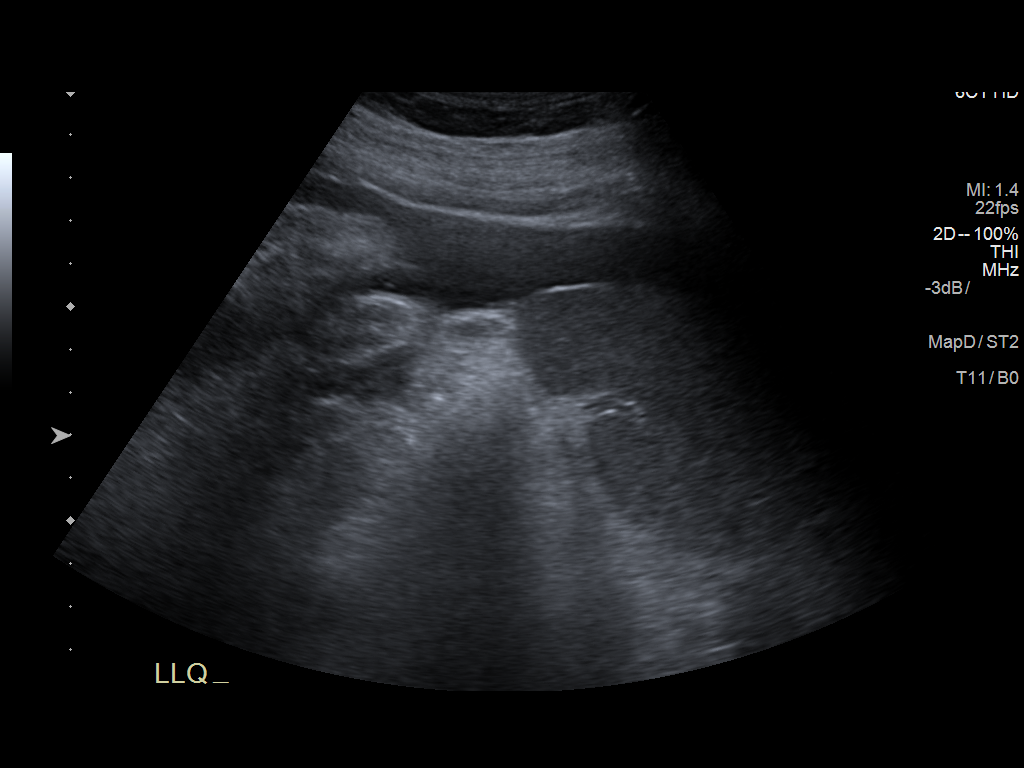

[5 of 5 positions shown; findings below may reference images not displayed]

FINDINGS: Mild ascites is noted although no large sizable pocket to allow for
safe paracentesis is noted.
IMPRESSION: Mild ascites.
